# Patient Record
Sex: Female | Born: 1935 | Race: White | Hispanic: No | Smoking: Never smoker
Health system: Southern US, Community
[De-identification: ages and names within clinical notes are randomized; demographics above are authoritative.]

## PROBLEM LIST (undated history)

## (undated) DIAGNOSIS — H9190 Unspecified hearing loss, unspecified ear: Secondary | ICD-10-CM

## (undated) DIAGNOSIS — I10 Essential (primary) hypertension: Secondary | ICD-10-CM

## (undated) DIAGNOSIS — R600 Localized edema: Secondary | ICD-10-CM

## (undated) DIAGNOSIS — E039 Hypothyroidism, unspecified: Secondary | ICD-10-CM

## (undated) DIAGNOSIS — M1990 Unspecified osteoarthritis, unspecified site: Secondary | ICD-10-CM

## (undated) DIAGNOSIS — E119 Type 2 diabetes mellitus without complications: Secondary | ICD-10-CM

## (undated) DIAGNOSIS — I499 Cardiac arrhythmia, unspecified: Secondary | ICD-10-CM

## (undated) DIAGNOSIS — G629 Polyneuropathy, unspecified: Secondary | ICD-10-CM

## (undated) DIAGNOSIS — E079 Disorder of thyroid, unspecified: Secondary | ICD-10-CM

## (undated) DIAGNOSIS — K5790 Diverticulosis of intestine, part unspecified, without perforation or abscess without bleeding: Secondary | ICD-10-CM

## (undated) DIAGNOSIS — M810 Age-related osteoporosis without current pathological fracture: Secondary | ICD-10-CM

## (undated) DIAGNOSIS — G4730 Sleep apnea, unspecified: Secondary | ICD-10-CM

## (undated) HISTORY — PX: GALLBLADDER SURGERY: SHX652

## (undated) HISTORY — DX: Essential (primary) hypertension: I10

## (undated) HISTORY — DX: Type 2 diabetes mellitus without complications: E11.9

## (undated) HISTORY — DX: Age-related osteoporosis without current pathological fracture: M81.0

## (undated) HISTORY — PX: KNEE SURGERY: SHX244

## (undated) HISTORY — PX: ABDOMINAL HYSTERECTOMY: SHX81

## (undated) HISTORY — PX: JOINT REPLACEMENT: SHX530

## (undated) HISTORY — PX: CHOLECYSTECTOMY: SHX55

## (undated) HISTORY — PX: HEMORRHOID SURGERY: SHX153

---

## 2004-08-11 ENCOUNTER — Ambulatory Visit: Payer: Self-pay | Admitting: Family Medicine

## 2004-08-21 ENCOUNTER — Ambulatory Visit: Payer: Self-pay | Admitting: Family Medicine

## 2004-09-20 ENCOUNTER — Ambulatory Visit: Payer: Self-pay | Admitting: Family Medicine

## 2004-10-21 ENCOUNTER — Ambulatory Visit: Payer: Self-pay | Admitting: Family Medicine

## 2006-05-25 ENCOUNTER — Ambulatory Visit: Payer: Self-pay | Admitting: Family Medicine

## 2006-07-11 ENCOUNTER — Ambulatory Visit: Payer: Self-pay | Admitting: Gastroenterology

## 2006-08-07 ENCOUNTER — Ambulatory Visit: Payer: Self-pay | Admitting: Gastroenterology

## 2006-08-22 ENCOUNTER — Ambulatory Visit: Payer: Self-pay | Admitting: Gastroenterology

## 2006-11-06 ENCOUNTER — Ambulatory Visit: Payer: Self-pay | Admitting: Gastroenterology

## 2007-07-03 ENCOUNTER — Ambulatory Visit: Payer: Self-pay | Admitting: Family Medicine

## 2008-07-15 ENCOUNTER — Ambulatory Visit: Payer: Self-pay | Admitting: Family Medicine

## 2009-06-24 ENCOUNTER — Encounter: Payer: Self-pay | Admitting: Rheumatology

## 2009-07-21 ENCOUNTER — Ambulatory Visit: Payer: Self-pay | Admitting: Family Medicine

## 2010-08-30 ENCOUNTER — Ambulatory Visit: Payer: Self-pay | Admitting: Family Medicine

## 2011-01-23 DIAGNOSIS — R7989 Other specified abnormal findings of blood chemistry: Secondary | ICD-10-CM | POA: Insufficient documentation

## 2011-02-10 ENCOUNTER — Inpatient Hospital Stay: Payer: Self-pay | Admitting: Internal Medicine

## 2011-03-30 ENCOUNTER — Ambulatory Visit: Payer: Self-pay | Admitting: Gastroenterology

## 2011-04-07 ENCOUNTER — Ambulatory Visit: Payer: Self-pay | Admitting: Gastroenterology

## 2011-09-13 ENCOUNTER — Ambulatory Visit: Payer: Self-pay | Admitting: Family Medicine

## 2012-06-18 ENCOUNTER — Ambulatory Visit: Payer: Self-pay | Admitting: General Practice

## 2012-06-18 LAB — URINALYSIS, COMPLETE
Bilirubin,UR: NEGATIVE
Blood: NEGATIVE
Ketone: NEGATIVE
Ph: 5 (ref 4.5–8.0)
RBC,UR: <1 /HPF (ref 0–5)
Squamous Epithelial: 3
WBC UR: 1 /HPF (ref 0–5)

## 2012-06-18 LAB — BASIC METABOLIC PANEL
BUN: 29 mg/dL — ABNORMAL HIGH (ref 7–18)
Calcium, Total: 9.3 mg/dL (ref 8.5–10.1)
Chloride: 106 mmol/L (ref 98–107)
Co2: 26 mmol/L (ref 21–32)
Creatinine: 0.85 mg/dL (ref 0.60–1.30)
EGFR (African American): >60
EGFR (Non-African Amer.): >60
Glucose: 121 mg/dL — ABNORMAL HIGH (ref 65–99)
Sodium: 140 mmol/L (ref 136–145)

## 2012-06-18 LAB — CBC
HCT: 42.2 % (ref 35.0–47.0)
MCH: 30.7 pg (ref 26.0–34.0)
MCV: 89 fL (ref 80–100)
Platelet: 208 10*3/uL (ref 150–440)
RDW: 14.1 % (ref 11.5–14.5)
WBC: 9.5 10*3/uL (ref 3.6–11.0)

## 2012-06-18 LAB — PROTIME-INR: Prothrombin Time: 12.8 secs (ref 11.5–14.7)

## 2012-06-18 LAB — SEDIMENTATION RATE: Erythrocyte Sed Rate: 6 mm/hr (ref 0–30)

## 2012-06-19 LAB — URINE CULTURE

## 2012-07-04 ENCOUNTER — Inpatient Hospital Stay: Payer: Self-pay | Admitting: General Practice

## 2012-07-05 LAB — BASIC METABOLIC PANEL
Anion Gap: 6 — ABNORMAL LOW (ref 7–16)
Creatinine: 0.91 mg/dL (ref 0.60–1.30)
Glucose: 135 mg/dL — ABNORMAL HIGH (ref 65–99)
Osmolality: 278 (ref 275–301)

## 2012-07-05 LAB — HEMOGLOBIN: HGB: 12.2 g/dL (ref 12.0–16.0)

## 2012-07-05 LAB — PLATELET COUNT: Platelet: 170 10*3/uL (ref 150–440)

## 2012-07-06 LAB — BASIC METABOLIC PANEL
Anion Gap: 4 — ABNORMAL LOW (ref 7–16)
BUN: 17 mg/dL (ref 7–18)
Calcium, Total: 8.7 mg/dL (ref 8.5–10.1)
Chloride: 111 mmol/L — ABNORMAL HIGH (ref 98–107)
Creatinine: 0.9 mg/dL (ref 0.60–1.30)
EGFR (African American): >60
Glucose: 102 mg/dL — ABNORMAL HIGH (ref 65–99)
Osmolality: 285 (ref 275–301)
Potassium: 4.5 mmol/L (ref 3.5–5.1)
Sodium: 142 mmol/L (ref 136–145)

## 2012-07-06 LAB — PLATELET COUNT: Platelet: 150 10*3/uL (ref 150–440)

## 2012-07-06 LAB — HEMOGLOBIN: HGB: 12.5 g/dL (ref 12.0–16.0)

## 2013-01-08 ENCOUNTER — Ambulatory Visit: Payer: Self-pay | Admitting: Family Medicine

## 2013-03-25 ENCOUNTER — Ambulatory Visit (INDEPENDENT_AMBULATORY_CARE_PROVIDER_SITE_OTHER): Payer: MEDICARE | Admitting: Podiatry

## 2013-03-25 ENCOUNTER — Encounter: Payer: Self-pay | Admitting: Podiatry

## 2013-03-25 VITALS — BP 126/57 | HR 60 | Resp 18 | Ht 62.0 in | Wt 182.0 lb

## 2013-03-25 DIAGNOSIS — M79609 Pain in unspecified limb: Secondary | ICD-10-CM

## 2013-03-25 DIAGNOSIS — B351 Tinea unguium: Secondary | ICD-10-CM

## 2013-03-25 MED ORDER — GABAPENTIN 300 MG PO CAPS: ORAL_CAPSULE | ORAL | Status: DC

## 2013-03-25 NOTE — Progress Notes (Signed)
Cheryl Rios presents today for routine debridement of her toenails she is also complaining of increased burning to her feet bilaterally she states that the feet burn more at nighttime than they do during the day. She continues to take her Neurontin 300 mg 1 twice a day.  Objective: Vital signs are stable she is alert and oriented x3. Pulses remain palpable bilateral. Nails are thick yellow dystrophic clinically mycotic.  Assessment: Peripheral neuropathy with pain in limb secondary to onychomycosis.  Plan: Debridement of nails in thickness and length as a covered service today I also evaluated her concentration of gabapentin. At this point we are going increase 300 mg at nighttime. So from here on out she will be taking 300 mg in the morning and 600 mg at dinner time. All with her in 3 months.

## 2013-06-24 ENCOUNTER — Ambulatory Visit (INDEPENDENT_AMBULATORY_CARE_PROVIDER_SITE_OTHER): Payer: MEDICARE | Admitting: Podiatry

## 2013-06-24 ENCOUNTER — Encounter: Payer: Self-pay | Admitting: Podiatry

## 2013-06-24 VITALS — BP 131/63 | HR 87 | Resp 16 | Ht 62.0 in | Wt 190.0 lb

## 2013-06-24 DIAGNOSIS — E1149 Type 2 diabetes mellitus with other diabetic neurological complication: Secondary | ICD-10-CM

## 2013-06-24 DIAGNOSIS — Q828 Other specified congenital malformations of skin: Secondary | ICD-10-CM

## 2013-06-24 DIAGNOSIS — M79609 Pain in unspecified limb: Secondary | ICD-10-CM

## 2013-06-24 DIAGNOSIS — B351 Tinea unguium: Secondary | ICD-10-CM

## 2013-06-24 MED ORDER — GABAPENTIN 300 MG PO CAPS: ORAL_CAPSULE | ORAL | Status: DC

## 2013-06-24 NOTE — Progress Notes (Signed)
She presents today with a chief complaint of painful toenails one through 5 bilateral.  Objective: Vital signs are stable she is alert and oriented x3. Pulses are palpable bilateral. Nails are thick yellow dystrophic onychomycotic and painful palpation.  Assessment: Pain in limb secondary to onychomycosis 1 through 5 bilateral.  Plan: Debridement nails 1 through 5 bilateral is cover service secondary to pain. Followup with her in 3 months

## 2013-09-23 ENCOUNTER — Ambulatory Visit (INDEPENDENT_AMBULATORY_CARE_PROVIDER_SITE_OTHER): Payer: MEDICARE | Admitting: Podiatry

## 2013-09-23 ENCOUNTER — Encounter: Payer: Self-pay | Admitting: Podiatry

## 2013-09-23 VITALS — BP 146/82 | HR 68 | Resp 18

## 2013-09-23 DIAGNOSIS — E1149 Type 2 diabetes mellitus with other diabetic neurological complication: Secondary | ICD-10-CM

## 2013-09-23 MED ORDER — GABAPENTIN 300 MG PO CAPS: ORAL_CAPSULE | ORAL | Status: DC

## 2013-09-23 NOTE — Progress Notes (Signed)
She presents today for followup of her neuropathy and her gabapentin.  Objective: She still has some breakthrough pain bilateral.  Assessment: Diabetic peripheral neuropathy.  Plan: Increase her Neurontin 600 mg in the morning 300 mg at lunch and 600 mg at bedtime.

## 2013-10-04 DIAGNOSIS — M810 Age-related osteoporosis without current pathological fracture: Secondary | ICD-10-CM | POA: Insufficient documentation

## 2013-10-16 ENCOUNTER — Encounter: Payer: Self-pay | Admitting: Rheumatology

## 2013-10-21 ENCOUNTER — Encounter: Payer: Self-pay | Admitting: Rheumatology

## 2013-10-28 ENCOUNTER — Ambulatory Visit (INDEPENDENT_AMBULATORY_CARE_PROVIDER_SITE_OTHER): Payer: MEDICARE | Admitting: Podiatry

## 2013-10-28 VITALS — BP 125/60 | HR 82 | Resp 16

## 2013-10-28 DIAGNOSIS — M79609 Pain in unspecified limb: Secondary | ICD-10-CM

## 2013-10-28 DIAGNOSIS — E1149 Type 2 diabetes mellitus with other diabetic neurological complication: Secondary | ICD-10-CM

## 2013-10-28 NOTE — Progress Notes (Signed)
She presents today for followup of the pain in her feet she states that 600 mg of Neurontin in the morning, 300 at lunch in 600 and bedtime seems to work very well. She states that she has very little pain in her feet at this point.  Objective: Vital signs are stable she is alert oriented x3. Pulses are strongly palpable bilateral. No reproducible pain on palpation bilateral.  Assessment: Neuropathy bilateral foot.  Plan: Continue Neurontin 600 mg a.m., 300 mg at lunch and 600 mg before bed. Followup with her as needed.

## 2014-02-04 ENCOUNTER — Ambulatory Visit: Payer: Self-pay | Admitting: Family Medicine

## 2014-07-17 ENCOUNTER — Ambulatory Visit (INDEPENDENT_AMBULATORY_CARE_PROVIDER_SITE_OTHER): Payer: MEDICARE | Admitting: Podiatry

## 2014-07-17 VITALS — BP 115/60 | HR 88 | Resp 20

## 2014-07-17 DIAGNOSIS — E1149 Type 2 diabetes mellitus with other diabetic neurological complication: Secondary | ICD-10-CM

## 2014-07-17 DIAGNOSIS — B353 Tinea pedis: Secondary | ICD-10-CM

## 2014-07-17 DIAGNOSIS — L84 Corns and callosities: Secondary | ICD-10-CM

## 2014-07-17 DIAGNOSIS — E1140 Type 2 diabetes mellitus with diabetic neuropathy, unspecified: Secondary | ICD-10-CM

## 2014-07-17 MED ORDER — KETOCONAZOLE 2 % EX CREA: 1.0000 "application " | TOPICAL_CREAM | Freq: Every day | CUTANEOUS | Status: DC

## 2014-07-17 NOTE — Progress Notes (Signed)
Patient ID: Cheryl Rios, female   DOB: 1925-11-28, 81 y.o.   MRN: [id number]  Subjective: 81 year old female presents the office today with complaints of a rash on both of her feet. She states that the rash periodically will burn. This has been ongoing for several weeks. She has been applying antibiotic ointment overlying the area any resolution. She also states that she has calluses on both of her feet which are painful. She denies any redness or drainage around the callus sites. She denies any systemic complaints as fevers, chills, nausea, vomiting. No other complaints at this time.  She will be undergoing a knee replacement in early March.   Objective: AAO 3, NAD DP/PT pulses palpable, CRT less than 3 seconds Decrease in protective sensation with Simms Weinstein monofilament, decreased vibratory sensation, Achilles tendon reflex intact. Hyperkeratotic lesions are present to bilateral heels and bilateral submetatarsal 5. Upon debridement lesions there is no underlying ulceration, drainage, clinical signs of infection. There is dry, peeling, scaly skin bilateral feet with overlying erythematous skin changes consistent with tinea pedis. There is also mild area interdigitally.there is no drainage associated with lesions and there is no clinical signs of infection. No other areas of tenderness to bilateral lower extremities. No open lesions or other pre-ulcer lesions identified. No calf Compression, swelling, warmth, erythema.  Assessment: 81 year old female with likely tinea pedis bilaterally, hyperkeratotic lesions 4.  Plan: -Treatment options were discussed with the patient including alternatives, risks, complications. -Lesion sharply debrided 4 without complication/bleeding. -For likely tinea pedis prescribed ketoconazole cream. Discussed how to apply the medication with the patient. -Follow-up in 3 months or sooner if any problems are to arise. In the meantime, encouraged to call the  office with any questions, concerns, change in symptoms.

## 2014-07-17 NOTE — Patient Instructions (Signed)
Diabetes and Foot Care Diabetes may cause you to have problems because of poor blood supply (circulation) to your feet and legs. This may cause the skin on your feet to become thinner, break easier, and heal more slowly. Your skin may become dry, and the skin may peel and crack. You may also have nerve damage in your legs and feet causing decreased feeling in them. You may not notice minor injuries to your feet that could lead to infections or more serious problems. Taking care of your feet is one of the most important things you can do for yourself.  HOME CARE INSTRUCTIONS  Wear shoes at all times, even in the house. Do not go barefoot. Bare feet are easily injured.  Check your feet daily for blisters, cuts, and redness. If you cannot see the bottom of your feet, use a mirror or ask someone for help.  Wash your feet with warm water (do not use hot water) and mild soap. Then pat your feet and the areas between your toes until they are completely dry. Do not soak your feet as this can dry your skin.  Apply a moisturizing lotion or petroleum jelly (that does not contain alcohol and is unscented) to the skin on your feet and to dry, brittle toenails. Do not apply lotion between your toes.  Trim your toenails straight across. Do not dig under them or around the cuticle. File the edges of your nails with an emery board or nail file.  Do not cut corns or calluses or try to remove them with medicine.  Wear clean socks or stockings every day. Make sure they are not too tight. Do not wear knee-high stockings since they may decrease blood flow to your legs.  Wear shoes that fit properly and have enough cushioning. To break in new shoes, wear them for just a few hours a day. This prevents you from injuring your feet. Always look in your shoes before you put them on to be sure there are no objects inside.  Do not cross your legs. This may decrease the blood flow to your feet.  If you find a minor scrape,  cut, or break in the skin on your feet, keep it and the skin around it clean and dry. These areas may be cleansed with mild soap and water. Do not cleanse the area with peroxide, alcohol, or iodine.  When you remove an adhesive bandage, be sure not to damage the skin around it.  If you have a wound, look at it several times a day to make sure it is healing.  Do not use heating pads or hot water bottles. They may burn your skin. If you have lost feeling in your feet or legs, you may not know it is happening until it is too late.  Make sure your health care provider performs a complete foot exam at least annually or more often if you have foot problems. Report any cuts, sores, or bruises to your health care provider immediately. SEEK MEDICAL CARE IF:   You have an injury that is not healing.  You have cuts or breaks in the skin.  You have an ingrown nail.  You notice redness on your legs or feet.  You feel burning or tingling in your legs or feet.  You have pain or cramps in your legs and feet.  Your legs or feet are numb.  Your feet always feel cold. SEEK IMMEDIATE MEDICAL CARE IF:   There is increasing redness,   swelling, or pain in or around a wound.  There is a red line that goes up your leg.  Pus is coming from a wound.  You develop a fever or as directed by your health care provider.  You notice a bad smell coming from an ulcer or wound. Document Released: 05/06/2000 Document Revised: 01/09/2013 Document Reviewed: 10/16/2012 Exit[employer] Patient Information 2015 [employer]. This information is not intended to replace advice given to you by your health care provider. Make sure you discuss any questions you have with your health care provider.  

## 2014-07-22 ENCOUNTER — Ambulatory Visit: Payer: Self-pay | Admitting: General Practice

## 2014-07-23 ENCOUNTER — Ambulatory Visit: Payer: MEDICARE | Admitting: Podiatry

## 2014-08-06 ENCOUNTER — Inpatient Hospital Stay: Payer: Self-pay | Admitting: General Practice

## 2014-08-09 DIAGNOSIS — E119 Type 2 diabetes mellitus without complications: Secondary | ICD-10-CM | POA: Insufficient documentation

## 2014-08-09 DIAGNOSIS — E785 Hyperlipidemia, unspecified: Secondary | ICD-10-CM | POA: Insufficient documentation

## 2014-09-12 NOTE — Op Note (Signed)
PATIENT NAME:  Cheryl Rios, Cheryl Rios MR#:  [id number] DATE OF BIRTH:  08/23/1925  DATE OF PROCEDURE:  07/04/2012  PREOPERATIVE DIAGNOSIS:  Degenerative arthrosis of the right knee.   POSTOPERATIVE DIAGNOSIS:  Degenerative arthrosis of the right knee.   PROCEDURE PERFORMED:  Right total knee arthroplasty using computer-assisted navigation.   SURGEON: Illene LabradorJames P. Hooten, MD   ASSISTANT:  Van ClinesJon Wolfe, PA.  ANESTHESIA:  Femoral nerve block and spinal.   ESTIMATED BLOOD LOSS:  100 mL.   FLUIDS REPLACED:  1200 mL of crystalloid.   TOURNIQUET TIME:  74 minutes.   DRAINS:  Two medium drains to reinfusion system.   SOFT TISSUE RELEASES:  Anterior cruciate ligament, posterior cruciate ligament, deep medial collateral ligament and patellofemoral ligament.   IMPLANTS UTILIZED:  DePuy PFC Sigma size 3 posterior stabilized femoral component (cemented), size 3 MBT tibial component (cemented), 35 mm 3-peg oval dome patella (cemented) and a 10 mm stabilized rotating platform polyethylene insert.   INDICATIONS FOR SURGERY:  The patient is an 81 year old female who has been seen for complaints of progressive right knee pain. X-rays demonstrated severe degenerative changes in a tricompartmental fashion with relative varus deformity. The patient denies any significant improvement despite conservative nonsurgical intervention. After discussion of the risks and benefits of surgical intervention, the patient expressed understanding of the risks and benefits and agreed with plans for surgical intervention.   PROCEDURE IN DETAIL:  The patient was brought into the operating room and after adequate femoral nerve block and spinal anesthesia was achieved, a tourniquet was placed on the patient's upper right thigh. The patient's right knee and leg were cleaned and prepped with alcohol and DuraPrep and draped in the usual sterile fashion. A "timeout" was performed as per usual protocol. The right lower extremity was exsanguinated  using an Esmarch, and the tourniquet was inflated to 300 mmHg. An anterior longitudinal incision was made followed by a standard mid vastus approach. A moderate effusion was evacuated. The deep fibers of the medial collateral ligament were elevated in a subperiosteal fashion off the medial flare of the tibia so as to maintain a continuous soft tissue sleeve. The patella was subluxed laterally and the patellofemoral ligament was incised. Inspection of the knee demonstrated severe degenerative changes in a tricompartmental fashion with prominent osteophytes noted and full thickness loss of articular cartilage. Osteophytes were debrided using a rongeur. Anterior and posterior cruciate ligaments were excised. Two 4.0 mm Schanz pins were inserted into the femur and into the tibia for attachment of the array of trackers used for computer-assisted navigation. Hip center was identified using circumduction technique. Distal landmarks were mapped using the computer. The distal femur and proximal tibia were mapped using the computer. Distal femoral cutting guide was positioned using computer-assisted navigation so as to achieve a 5 degree distal valgus cut. Cut was performed and verified using the computer. The distal femur was sized and it was felt that a size 3 femoral component was appropriate. The size 3 cutting guide was positioned and anterior cut was performed and verified using the computer. This was followed by completion of the posterior and chamfer cuts. A femoral cutting guide for the central box was then positioned and the central box cut was performed.   Attention was then directed to the proximal tibia. Medial and lateral menisci were excised. The extramedullary tibial cutting guide was positioned using computer-assisted navigation so as to achieve 0 degree varus-valgus alignment and 0 degree posterior slope. Cut was performed and verified using the computer.  The proximal tibia was sized and it was felt that a  size 3 tibial tray was appropriate. Tibial and femoral trials were inserted followed by insertion of a 10 mm polyethylene insert. Excellent mediolateral soft tissue balancing was appreciated both in extension and in flexion. Finally, the patella was cut and prepared so as to accommodate a 35 mm 3-peg oval dome patella. Patellar trial was placed and the knee was placed through a range of motion with excellent patellar tracking appreciated.   Femoral trial was removed after debridement of posterior osteophytes. Central post hole for the tibial component was reamed followed by insertion of a keel punch. Tibial trials were then removed. The cut surfaces of bone were irrigated with copious amounts of normal saline with antibiotic solution using pulsatile lavage and suctioned dry. Polymethyl methacrylate cement with gentamicin was prepared in the usual fashion with a vacuum mixer. Cement was applied to the cut surface of the proximal tibia as well as along the undersurface of a size 3 MBT tibial component. The tibial component was positioned and impacted into place. Excess cement was removed using Personal assistant. Cement was then applied to the cut surface of the femur as well as along the posterior flanges of a size 3 posterior stabilized femoral component. The femoral component was positioned and impacted into place. Excess cement was removed using Personal assistant. A 10 mm polyethylene trial was inserted and the knee was brought in full extension with steady axial compression applied. Finally, cement was applied to the backside of a 35 mm 3-peg oval dome patella and the patellar component was positioned and patellar clamp applied. Excess cement was removed using Personal assistant.   After adequate curing of cement, the tourniquet was deflated after a total tourniquet time of 74 minutes. Hemostasis was achieved using electrocautery. The knee was irrigated with copious amounts of normal saline with antibiotic solution  using pulsatile lavage and then suctioned dry. The knee was inspected for any residual cement debris. Then, 30 mL of 0.25% Marcaine with epinephrine was injected along the posterior capsule. A 10 mm stabilized rotating platform polyethylene insert was inserted and the knee was placed through range of motion with excellent patellar tracking appreciated. Two medium drains were placed in the wound bed and brought out through a separate stab incision to be attached to a reinfusion system.   The medial parapatellar portion of the incision was reapproximated using interrupted sutures of #1 Vicryl. The subcutaneous tissue was approximated in layers using first #0 Vicryl followed by 2-0 Vicryl. The skin was closed with skin staples. A sterile dressing was applied.   The patient tolerated the procedure well. She was transported to the recovery room in stable condition.    ____________________________ Illene Labrador. Angie Fava., MD [email or web address] D: 07/04/2012 22:38:10 ET T: 07/05/2012 00:09:49 ET JOB#: [id number]  cc: Illene Labrador. Angie Fava., MD, <Dictator> Illene Labrador Angie Fava MD ELECTRONICALLY SIGNED 07/06/2012 9:26

## 2014-09-12 NOTE — Discharge Summary (Signed)
PATIENT NAME:  Cheryl Rios, Cheryl Rios MR#:  [id number] DATE OF BIRTH:  30-Jan-1926  DATE OF ADMISSION:  07/04/2012 DATE OF DISCHARGE:  07/07/2012  ADMITTING DIAGNOSIS: Degenerative arthrosis of the right knee.   DISCHARGE DIAGNOSIS: Degenerative arthrosis of the right knee.   OPERATION: On 07/04/2012, the patient had a right total knee arthroplasty using computer-aided navigation.   SURGEON: Francesco SorJames Hooten, M.D.   ASSISTANT: Van ClinesJon Wolfe, PA.   ANESTHESIA: Femoral nerve block and spinal.   ESTIMATED BLOOD LOSS: 100 mL.   FLUIDS REPLACED: 1200 mL of crystalloid.   TOURNIQUET TIME: 74 minutes.   DRAINS: Two medium drains to reinfusion system.   IMPLANTS UTILIZED: DePuy PFC Sigma size 3 posterior stabilized femoral component that was cemented, a size 3 MBT tibial component that was cemented, a 35-mm, 3-peg oval dome patella that was cemented, and a 10-mm stabilized rotating platform polyethylene insert. No complications. The patient brought to the recovery room, and then brought to the Orthopaedic floor for pain control and physical therapy.   HISTORY OF PRESENT ILLNESS: The patient is 81 year old female who presented for an upcoming total knee replacement on the right. The patient has had Synvisc injections and cortisone injections with no relief. The patient has continued to have significant pain and difficulty with activities of daily living.   PHYSICAL EXAMINATION:  GENERAL: A well-developed, well-nourished female with an antalgic gait and a varus thrust to the right knee.  CARDIOVASCULAR: Normal rate and rhythm.  LUNGS: Clear to auscultation.  MUSCULOSKELETAL: In regard to the right lower extremity, the patient has medial joint line tenderness with pseudolaxity medially. The patient has full extension to 110 degrees flexion with pain. The patient has no specific atrophy or quadriceps weakness.   HOSPITAL COURSE: After initial admission on 07/04/2012, the patient was brought to the Orthopaedic  floor. On postoperative day 1, the patient had a hemoglobin of 12.2, which did not seem to drop, it was down to 12.5 on the 14th after receiving transfused blood from her [employer] system. The patient did not receive any specific blood. The patient was tolerating physical therapy well and was ambulating around the nurse's station including did stairs the day before discharge and was having only mild to moderate pain. The patient was ready to go home with home health physical therapy on 07/07/2012 and had no other specific complications.   DISCHARGE INSTRUCTIONS:  1. [facility] 07/19/2012 at 9:45 with Van ClinesJon Wolfe. The patient will do weight bear as tolerated on her right leg. The patient will raise her leg with 1 to 2 pillows. She will use thigh-high TED hose on both legs and removed at bedtime. The patient will elevate her heels off the bed.  2. The patient will do a regular diet.  3. The patient will use her Polar Care System to decrease swelling and control pain.  4. The patient will call the clinic if there is any bright red bleeding or calf pain, or any fever greater than 101.5 and any bowel or bladder difficulty.   DISCHARGE MEDICATIONS: Resume home medications and the to add oxycodone 5 mg 1 tablet q.4 to 6h. p.r.n. for severe pain, Celebrex 2 mg p.o. b.i.d. for one month.   ____________________________ Shela CommonsJ. Dedra Skeensodd , [place]PA [email or web address]:jm D: 07/07/2012 06:59:24 ET T: 07/07/2012 11:03:00 ET JOB#: [id number]  cc: J. Dedra Skeensodd , [place], <Dictator> J  [facility] CenterMUNDY PA ELECTRONICALLY SIGNED 07/11/2012 8:08

## 2014-09-21 NOTE — Op Note (Signed)
PATIENT NAME:  Idalia NeedleDUNDALOW, Cheryl MR#:  [id number] DATE OF BIRTH:  [birth year]  DATE OF PROCEDURE:  08/06/2014  PREOPERATIVE DIAGNOSIS: Degenerative arthrosis of the left knee (primary).   POSTOPERATIVE DIAGNOSIS: Degenerative arthrosis of the left knee (primary).  PROCEDURE PERFORMED: Left total knee arthroplasty using computer-assisted navigation.   SURGEON: Dr. Francesco SorJames Hooten.  ASSISTANT: Cranston Neighborhris Gaines, PA-C (required to maintain retraction throughout the procedure).   ANESTHESIA: Spinal.   ESTIMATED BLOOD LOSS: 100 mL.   FLUIDS REPLACED: 1700 mL of crystalloid.   TOURNIQUET TIME: 95 minutes.   DRAINS: Two medium drains to reinfusion system.   SOFT TISSUE RELEASES: Anterior cruciate ligament, posterior cruciate ligament, deep medial collateral ligament, and patellofemoral ligament.   IMPLANTS UTILIZED: [employer] Sigma size 3 posterior stabilized femoral component (cemented), size 3 MBT tibial component (cemented), 35 mm, 3 peg oval dome patella (cemented), and a 10 mm stabilized rotating platform polyethylene insert. Gentamicin bone cement was utilized due to the patient's history of diabetes.  INDICATIONS FOR SURGERY: The patient is an 81 year old female who has been seen for complaints of progressive left knee pain. X-rays demonstrated severe degenerative changes in tricompartmental fashion with relative varus deformity. After discussion of the risks and benefits of surgical intervention, the patient expressed understanding of the risks, and benefits, and agreed with plans for surgical intervention.   PROCEDURE IN DETAIL: The patient was brought in to the operating room and, after adequate spinal anesthesia was achieved, a tourniquet was placed on the patient's upper left thigh. The patient's left knee and leg were cleaned and prepped with alcohol and DuraPrep, and draped in the usual sterile fashion. A "timeout" was performed as per usual protocol. The left lower extremity was  exsanguinated using an Esmarch, tourniquet was inflated to 300 mmHg.  An anterior longitudinal incision was made followed by a standard mid vastus approach. A moderate effusion was evacuated. The deep fibers of the medial collateral ligament were elevated in a subperiosteal fashion off the medial flare of the tibia so as to maintain a continuous soft tissue sleeve. The patella was subluxed laterally and the patellofemoral ligament was incised. Inspection of the knee demonstrated severe degenerative changes with full-thickness loss of articular cartilage to the medial compartment. Prominent osteophytes were debrided using a rongeur. Anterior and posterior cruciate ligaments were excised. Two 4.0 mm Schanz pins were inserted into the femur and into the tibia for attachment of the ray of trackers used for computer-assisted navigation. Hip center was identified using circumduction technique. Distal landmarks were mapped using the computer. The distal femur and proximal tibia were mapped using the computer. A distal femoral cutting guide was positioned using computer-assisted navigation so as to achieve 5 degrees distal valgus cut. Cut was performed and verified using the computer. Distal femur was sized and it was felt that a size 3 femoral component was appropriate. A size 3 cutting guide was positioned, and anterior cut was performed, and verified using the computer. This was followed by completion of the posterior and chamfer cuts. Femoral cutting guide for a central box was then positioned, and the central box cut was performed. Attention was then directed to the proximal tibia. Medial and lateral menisci were excised. The extramedullary tibial cutting guide was positioned using computer-assisted navigation so as to achieve a 0 degree varus valgus alignment and 0 degree posterior slope. Cut was performed and verified using the computer. The proximal tibia was sized and it was felt that a size 3 tibial tray was  appropriate. Tibial  and femoral trials were inserted followed by insertion of a 10 mm polyethylene trial. Excellent mediolateral soft tissue balancing was appreciated both in full extension and in flexion. Finally, the patella was cut and repaired so as to accommodate a 35 mm, 3 peg oval dome patella. Patellar trial was placed.  The knee was placed through a range of motion with excellent patellar tracking appreciated. The femoral trial was removed. Central post over the tibial component was reamed followed by insertion of a keel punch. Tibial trial was then removed. The cut surfaces of bone were irrigated with copious amounts of normal saline with antibiotic solution using pulsatile lavage and then suctioned dry. Polymethyl methacrylate cement with gentamicin was prepared in the usual fashion using a vacuum mixer. Cement was applied to the cut surface of the proximal tibia as well as along the undersurface of a size 3 MBT tibial component. The tibial component was positioned and impacted into place. Excess cement was removed using Personal assistant. Cement was then applied to the cut surface of the femur as well as on the posterior flanges of a size 3 posterior stabilized femoral component. Femoral component was positioned and impacted into place. Excess cement was removed using Personal assistant. A 10 mm polyethylene trial was inserted and the knee was brought in full extension with steady axial compression applied. Finally, cement was applied to the backside of a 35 mm. 3 peg oval dome patella, and the patella component was positioned, and patellar clamp applied. Excess cement was removed using Personal assistant.   After adequate curing of cement, the tourniquet was deflated after a total tourniquet time of 95 minutes. Hemostasis was achieved using electrocautery.  The knee was irrigated with copious amounts of normal saline with antibiotic solution using pulsatile lavage and then suctioned dry. The knee was  inspected for any residual cement debris. Twenty mL of 1.3% Exparel and 40 mL of normal saline was injected along the posterior capsule, medial and lateral gutters, and along the arthrotomy site. A 10 mm stabilized rotating platform polyethylene insert was inserted and the knee was placed through range of motion with excellent patellar tracking appreciated and excellent medial and lateral soft tissue balancing noted. Two medium drains were placed in the wound bed and brought out through a separate stab incision to be attached to a reinfusion system. The medial parapatellar portion of the incision was reapproximated using interrupted sutures of #1 Vicryl. The subcutaneous tissue was approximated in layers using first #0 Vicryl followed by #2-0 Vicryl. The subcutaneous tissue had been injected with a total of 30 mL of 0.25% Marcaine with epinephrine. Skin was approximated with skin staples. A sterile dressing was applied.   The patient tolerated the procedure well. She was transported to the recovery room in stable condition.   ____________________________ Illene Labrador. Angie Fava., MD jph:sp D: 08/07/2014 06:09:04 ET T: 08/07/2014 11:18:03 ET JOB#: [id number]  cc: Illene Labrador. Angie Fava., MD, <Dictator> Illene Labrador Angie Fava MD ELECTRONICALLY SIGNED 08/09/2014 16:49

## 2014-09-21 NOTE — Discharge Summary (Signed)
PATIENT NAME:  Cheryl Rios, Cheryl Rios MR#:  [id number] DATE OF BIRTH:  [birth year]  DATE OF ADMISSION:  08/06/2014 DATE OF DISCHARGE:  08/09/2014  ADMITTING DIAGNOSIS: Degenerative arthritis of left knee.   DISCHARGE DIAGNOSIS: Degenerative arthritis of left knee.   PROCEDURE PERFORMED: Left total knee arthroplasty using computer-assisted navigation.   SURGEON: DrFayrene Fearing.. James Hooten  ASSISTANT: Cranston Neighborhris , PA-C   ANESTHESIA: Spinal.   ESTIMATED BLOOD LOSS: 100 mL.   FLUIDS REPLACED: 1700 mL of crystalloid.   TOURNIQUET TIME: 95 minutes.   DRAINS: Two medium drains to reinfusion system.   SOFT TISSUE RELEASES: Anterior cruciate ligament, posterior cruciate ligament, deep medial collateral ligament and patellofemoral ligament.   IMPLANTS UTILIZED: DePuy P[employer] Sigma size 3 posterior stabilized femoral component, size 3 MBT tibial component, 35 mm 3 peg oval dome patella, and a 10 mm stabilized rotating platform polyethylene insert. Gentamicin bone cement was utilized due to the patient's history of diabetes.   INDICATIONS FOR SURGERY: The patient is an 81 year old female who has been seen for complaints of progressive left knee pain. X-rays demonstrated severe degenerative changes in the tricompartmental fashion with relative varus deformity. After discussion of the risks and benefits of surgical intervention, the patient expressed understanding of the risks and benefits and agreed with plans for surgical intervention.   PHYSICAL EXAMINATION: GENERAL: Well developed, well-nourished female, no apparent distress,  normal affect. Antalgic component  left lower extremity.  LEFT KNEE:  Examination  shows the patient has mild swelling with no warmth, erythema or effusion. She is tender along the medial joint line, nontender along the lateral joint line. She has no significant laxity with valgus and varus stress testing. She is neurovascularly intact in the left lower extremity.  HEART: Regular rate and  rhythm.  LUNGS: Clear to auscultation bilaterally. No wheezing, rales or rhonchi.   HOSPITAL COURSE: The patient was admitted to the hospital on 08/06/2014. She had surgery  that same day and was brought to the orthopedic floor from the PACU in stable condition. On postoperative day 1, the patient had acute postoperative blood loss anemia with a hemoglobin of 10.6. On postoperative day 2, this hemoglobin did trend up to 10.7. Vital signs and labs remained stable. She did have some good progress with physical therapy, but ultimately the physical therapist had recommended she return go to a skilled nursing facility. By postoperative day 3, the patient was stable and ready for discharge to a rehab facility.   CONDITION AT DISCHARGE:  Stable.   DISCHARGE INSTRUCTIONS: The patient may gradually increase weight-bearing on the affected extremity. Elevate the affected foot or leg on 1 or 2 pillows with the foot higher than the knee. Thigh-high TED hose on both legs and remove 1 hour per 8 hour shift. Elevate the heels off the bed. Use incentive spirometer every hour while awake. Encourage cough and deep breathing. The patient may resume a regular diet as tolerated. Continue using Polar Care unit maintaining a temperature between 40 and 50 degrees.  Do not get the dressing or bandage wet or dirty. [facility] if the dressing gets water under it. Leave the dressing on. [facility] if any of the following occur: Bright red bleeding from the incisional wound, fever above 101.5 degrees, redness, swelling, or drainage at the incision site. Call [facility] if you experience any increased leg pain, numbness or weakness in her legs or bowel or bladder symptoms. Physical therapy has been arranged at  rehab facility. [facility] for  any concerns.   DISCHARGE MEDICATIONS: Please see list of discharge medications on discharge instructions.    ____________________________ T. Cranston Neighbor,  PA-C tcg:tr D: 08/08/2014 12:17:52 ET T: 08/08/2014 12:48:37 ET JOB#: [id number]  cc: T. Cranston Neighbor, PA-C, <Dictator> Evon Slack [place] ELECTRONICALLY SIGNED 08/13/2014 15:18

## 2014-09-29 ENCOUNTER — Ambulatory Visit (INDEPENDENT_AMBULATORY_CARE_PROVIDER_SITE_OTHER): Payer: MEDICARE | Admitting: Podiatry

## 2014-09-29 ENCOUNTER — Encounter: Payer: Self-pay | Admitting: Podiatry

## 2014-09-29 VITALS — Ht 62.0 in | Wt 190.0 lb

## 2014-09-29 DIAGNOSIS — B351 Tinea unguium: Secondary | ICD-10-CM | POA: Diagnosis not present

## 2014-09-29 DIAGNOSIS — E1140 Type 2 diabetes mellitus with diabetic neuropathy, unspecified: Secondary | ICD-10-CM | POA: Diagnosis not present

## 2014-09-29 DIAGNOSIS — M79673 Pain in unspecified foot: Secondary | ICD-10-CM

## 2014-09-29 DIAGNOSIS — B353 Tinea pedis: Secondary | ICD-10-CM

## 2014-09-29 DIAGNOSIS — E1149 Type 2 diabetes mellitus with other diabetic neurological complication: Secondary | ICD-10-CM

## 2014-09-30 NOTE — Progress Notes (Signed)
She presents today with chief complaint of a rash and plantar aspect of the bilateral foot she states that she uses ketoconazole and the rash goes away. She states that her nails are thick yellow and painful and irritated her with shoe gear.  Objective: Vital signs are stable she is alert and oriented 3 pulses are strongly palpable bilateral. Neurologic sensorium is intact. Deep tendon reflexes are intact. Nails are thick yellow dystrophic onychomycotic and painful on palpation as well as debridement. She has patches of tinea pedis to the plantar aspect of the bilateral foot.  Assessment: Pain in limb secondary to onychomycosis 1 through 5 bilateral with tinea pedis.  Plan: Discussed etiology and pathology conservative versus surgical therapies. Debrided nails 1 through 5 bilateral. Encouraged her to continue to use the ketoconazole on a regular basis

## 2014-11-03 ENCOUNTER — Ambulatory Visit: Payer: MEDICARE

## 2014-12-18 ENCOUNTER — Other Ambulatory Visit: Payer: Self-pay | Admitting: Podiatry

## 2014-12-30 ENCOUNTER — Ambulatory Visit (INDEPENDENT_AMBULATORY_CARE_PROVIDER_SITE_OTHER): Payer: MEDICARE | Admitting: Podiatry

## 2014-12-30 DIAGNOSIS — M79673 Pain in unspecified foot: Secondary | ICD-10-CM

## 2014-12-30 DIAGNOSIS — B351 Tinea unguium: Secondary | ICD-10-CM

## 2014-12-30 DIAGNOSIS — E1149 Type 2 diabetes mellitus with other diabetic neurological complication: Secondary | ICD-10-CM

## 2014-12-30 DIAGNOSIS — E1140 Type 2 diabetes mellitus with diabetic neuropathy, unspecified: Secondary | ICD-10-CM

## 2014-12-30 NOTE — Progress Notes (Signed)
Subjective: 81 y.o. returns the office today for painful, elongated, thickened toenails which she is unable to trim herself. Denies any redness or drainage around the nails. Denies any acute changes since last appointment and no new complaints today. Denies any systemic complaints such as fevers, chills, nausea, vomiting.   Objective: AAO 3, NAD DP/PT pulses palpable, CRT less than 3 seconds Protective sensation decreased with Simms Weinstein monofilament. Nails hypertrophic, dystrophic, elongated, brittle, discolored 10. There is tenderness overlying the nails 1-5 bilaterally. There is no surrounding erythema or drainage along the nail sites. No open lesions or pre-ulcerative lesions are identified. No evidence of tinea pedis at this time.  No other areas of tenderness bilateral lower extremities. No overlying edema, erythema, increased warmth. No pain with calf compression, swelling, warmth, erythema.  Assessment: Patient presents with symptomatic onychomycosis  Plan: -Treatment options including alternatives, risks, complications were discussed -Nails sharply debrided 10 without complication/bleeding. -Discussed daily foot inspection. If there are any changes, to call the office immediately.  -Follow-up in 3 months or sooner if any problems are to arise. In the meantime, encouraged to call the office with any questions, concerns, changes symptoms.   Ovid Curd, DPM

## 2015-04-02 ENCOUNTER — Ambulatory Visit (INDEPENDENT_AMBULATORY_CARE_PROVIDER_SITE_OTHER): Payer: MEDICARE | Admitting: Podiatry

## 2015-04-02 DIAGNOSIS — M79673 Pain in unspecified foot: Secondary | ICD-10-CM

## 2015-04-02 DIAGNOSIS — B351 Tinea unguium: Secondary | ICD-10-CM

## 2015-04-02 NOTE — Progress Notes (Signed)
Subjective: 81 y.o. returns the office today for painful, elongated, thickened toenails which she is unable to trim herself. Denies any redness or drainage around the nails. She also gets a painful callus on the right ball of the foot. Denies any redness or drainage. No open sores. Denies any acute changes since last appointment and no new complaints today. Denies any systemic complaints such as fevers, chills, nausea, vomiting.   Objective: AAO 3, NAD DP/PT pulses palpable, CRT less than 3 seconds Nails hypertrophic, dystrophic, elongated, brittle, discolored 10. There is tenderness overlying the nails 1-5 bilaterally. There is no surrounding erythema or drainage along the nail sites. Hyperkeratotic lesion right foot submetatarsal 4. Upon debridement no underlying ulceration, drainage or other signs of infection. No open lesions or pre-ulcerative lesions are identified. No evidence of tinea pedis at this time.  No other areas of tenderness bilateral lower extremities. No overlying edema, erythema, increased warmth. No pain with calf compression, swelling, warmth, erythema.  Assessment: Patient presents with symptomatic onychomycosis; hyperkeratotic lesion  Plan: -Treatment options including alternatives, risks, complications were discussed -Nails sharply debrided 10 without complication/bleeding. -hyperkeratotic lesion sharply debrided x1 without complication/bleeding. Offloading pads made. -Discussed daily foot inspection. If there are any changes, to call the office immediately.  -Follow-up in 3 months or sooner if any problems are to arise. In the meantime, encouraged to call the office with any questions, concerns, changes symptoms.   Ovid CurdMatthew Wagoner, DPM

## 2015-06-08 ENCOUNTER — Ambulatory Visit: Payer: MEDICARE | Admitting: Podiatry

## 2015-06-09 ENCOUNTER — Other Ambulatory Visit: Payer: Self-pay | Admitting: Podiatry

## 2015-06-09 ENCOUNTER — Ambulatory Visit (INDEPENDENT_AMBULATORY_CARE_PROVIDER_SITE_OTHER): Payer: MEDICARE

## 2015-06-09 ENCOUNTER — Encounter: Payer: Self-pay | Admitting: Podiatry

## 2015-06-09 ENCOUNTER — Ambulatory Visit (INDEPENDENT_AMBULATORY_CARE_PROVIDER_SITE_OTHER): Payer: MEDICARE | Admitting: Podiatry

## 2015-06-09 VITALS — BP 126/52 | HR 74 | Resp 18

## 2015-06-09 DIAGNOSIS — R52 Pain, unspecified: Secondary | ICD-10-CM

## 2015-06-09 DIAGNOSIS — B351 Tinea unguium: Secondary | ICD-10-CM

## 2015-06-09 DIAGNOSIS — S82401A Unspecified fracture of shaft of right fibula, initial encounter for closed fracture: Secondary | ICD-10-CM | POA: Diagnosis not present

## 2015-06-09 DIAGNOSIS — E1149 Type 2 diabetes mellitus with other diabetic neurological complication: Secondary | ICD-10-CM

## 2015-06-09 DIAGNOSIS — L84 Corns and callosities: Secondary | ICD-10-CM | POA: Diagnosis not present

## 2015-06-09 DIAGNOSIS — M79673 Pain in unspecified foot: Secondary | ICD-10-CM | POA: Diagnosis not present

## 2015-06-11 NOTE — Progress Notes (Signed)
Patient ID: Cheryl Rios, female   DOB: 1926/01/23, 82 y.o.   MRN: [id number] Subjective: 82 y.o. returns the office today forconcerns the rightankle and foot swelling and pain which has been ongoing for approximate one week and his been progressive. She states that she is having difficulty weightbearing to her right side due to the pain. She denies any recent injury or trauma. She denies any change or increase in activity. No tingling or numbness. She is also asking her nails to be trimmed in calluses as they are thickened elongated and causing pain as well. Denies any surrounding redness or drainage. No open sores. Denies any acute changes since last appointment and no new complaints today. Denies any systemic complaints such as fevers, chills, nausea, vomiting.   Objective: AAO 3, NAD DP/PT pulses palpable, CRT less than 3 seconds There is tenderness palpation to the acid aspect of the right foot and is also tenderness mostly localized overlying the lateral malleolus of the right ankle distally. There is no pain to the medial malleolus. No proximal tib-fib pain. Diffuse tenderness on the right foot however no specific area of tenderness. There is edema to theright ankle and the dorsal foot. No ecchymosis. No erythema or increase in warmth. Nails hypertrophic, dystrophic, elongated, brittle, discolored 10. There is tenderness overlying the nails 1-5 bilaterally. There is no surrounding erythema or drainage along the nail sites. Hyperkeratotic lesion right foot submetatarsal 4. Upon debridement no underlying ulceration, drainage or other signs of infection. No open lesions or pre-ulcerative lesions are identified. No other areas of tenderness bilateral lower extremities. No overlying edema, erythema, increased warmth. No pain with calf compression, swelling, warmth, erythema.  Assessment: Patient presents with possible right lateral malleolus fracture, symptomaticonychomycosis; hyperkeratotic  lesion  Plan: -Treatment options including alternatives, risks, complications were discussed -X-rays were obtained and reviewed with the patient. It is very questionable radiolucency in the distal aspect of the fibula concerning for possible fracture or stress fracture. Osteopenia is present. Because of this and given her inability to bear weight we'll recommend immobilization in the cam boot. She states that she has a cam boot at home. Discussed gout and other pathologies however we'll hold off on treatment for that or other issues ifsymptoms are not resolving. -Nails sharply debrided 80 without complication/bleeding. -Hyperkeratotic lesion sharply debrided x1 without complication/bleeding. Offloading pads made. -Discussed daily foot inspection. If there are any changes, to call the office immediately.  -Follow-up as scheduled  or sooner if any problems are to arise. In the meantime, encouraged to call the office with any questions, concerns, changes symptoms.   *she does not have a cam boot at home when she got home and she presented to the office on Thursday, January 19 to have a cam boot. This was fitted and dispensed by the medical assistant.  Ovid Curd, DPM

## 2015-06-23 ENCOUNTER — Ambulatory Visit (INDEPENDENT_AMBULATORY_CARE_PROVIDER_SITE_OTHER): Payer: MEDICARE

## 2015-06-23 ENCOUNTER — Encounter: Payer: Self-pay | Admitting: Podiatry

## 2015-06-23 ENCOUNTER — Ambulatory Visit (INDEPENDENT_AMBULATORY_CARE_PROVIDER_SITE_OTHER): Payer: MEDICARE | Admitting: Podiatry

## 2015-06-23 DIAGNOSIS — R609 Edema, unspecified: Secondary | ICD-10-CM

## 2015-06-23 DIAGNOSIS — M25571 Pain in right ankle and joints of right foot: Secondary | ICD-10-CM

## 2015-06-23 DIAGNOSIS — S82401D Unspecified fracture of shaft of right fibula, subsequent encounter for closed fracture with routine healing: Secondary | ICD-10-CM

## 2015-06-23 DIAGNOSIS — R52 Pain, unspecified: Secondary | ICD-10-CM

## 2015-06-23 NOTE — Progress Notes (Signed)
Patient ID: Cheryl Rios, female   DOB: June 08, 1925, 82 y.o.   MRN: [id number]  Subjective: 82 year old female presents the office today for follow-up evaluation of right ankle pain. She states that she has been wearing the boot at home although she does not work today she was driving. She states her pain has decreased her right ankle although the swelling does continue.  She did not take her fluid pill this morning and is why her foot is more swollen today. She denies any erythema or increased warmth. Her pain has subsided and she feels that she has improved. No other complaints at this time. No recent injury or trauma.  Objective: AAO 3, NAD DP/PT pulses palpable, CRT less than 3 seconds Varicose veins are present. There is decreased tenderness to palpation overlying the right ankle on the lateral malleolus. There is no pain of the foot. There is no other areas of pinpoint bony tenderness or pain vibratory sensation. There is mild edema to the right ankle although his chronic edema bilaterally. There is no overlying erythema or increase in warmth. There is no pain or restriction ankle, subtalar joint range of motion. Open lesions or pre-ulcer lesions. There is no pain to calf compression, swelling, warmth, erythema.  Assessment:  82 year old female with resolving right ankle pain, swelling  Plan: -Treatment options discussed including all alternatives, risks, and complications -X-rays were obtained and reviewed with the patient.  Today there is no definitive evidence of acute fracture or stress surgery. The area previous a visualized and split is not well visualized today. -At this time she can continue with regular, supportive shoe gear. -Tubagrip was applied to help with swelling -Recommended follow-up in 4 weeks' have her she wishes to hold off until she toenails trimmed and time. I'll see her back in 7 weeks. At that time if she still having pain we'll re-x-ray. I encouraged her that if  there is any increase in pain or change in symptoms to call the office immediately.  Ovid Curd, DPM

## 2015-06-23 NOTE — Patient Instructions (Signed)
I will see you back in 7 weeks. If your ankle pain worsens go back into the [place] and call the office. Otherwise, I will see you back in 7 weeks.

## 2015-07-03 ENCOUNTER — Ambulatory Visit: Payer: MEDICARE

## 2015-07-07 ENCOUNTER — Ambulatory Visit: Payer: MEDICARE

## 2015-07-07 ENCOUNTER — Ambulatory Visit: Payer: MEDICARE | Admitting: Sports Medicine

## 2015-08-13 ENCOUNTER — Encounter: Payer: Self-pay | Admitting: Podiatry

## 2015-08-13 ENCOUNTER — Telehealth: Payer: Self-pay | Admitting: *Deleted

## 2015-08-13 ENCOUNTER — Ambulatory Visit (INDEPENDENT_AMBULATORY_CARE_PROVIDER_SITE_OTHER): Payer: MEDICARE

## 2015-08-13 ENCOUNTER — Ambulatory Visit (INDEPENDENT_AMBULATORY_CARE_PROVIDER_SITE_OTHER): Payer: MEDICARE | Admitting: Podiatry

## 2015-08-13 VITALS — BP 107/40 | HR 69 | Resp 18

## 2015-08-13 DIAGNOSIS — M79676 Pain in unspecified toe(s): Secondary | ICD-10-CM

## 2015-08-13 DIAGNOSIS — R52 Pain, unspecified: Secondary | ICD-10-CM

## 2015-08-13 DIAGNOSIS — I872 Venous insufficiency (chronic) (peripheral): Secondary | ICD-10-CM | POA: Diagnosis not present

## 2015-08-13 DIAGNOSIS — B351 Tinea unguium: Secondary | ICD-10-CM

## 2015-08-13 DIAGNOSIS — M25471 Effusion, right ankle: Secondary | ICD-10-CM | POA: Diagnosis not present

## 2015-08-13 DIAGNOSIS — I82409 Acute embolism and thrombosis of unspecified deep veins of unspecified lower extremity: Secondary | ICD-10-CM

## 2015-08-13 NOTE — Telephone Encounter (Addendum)
-----   Message from Vivi BarrackMatthew R Wagoner, DPM sent at 08/13/2015  9:41 AM EDT ----- Can you order a venous duplex to rule out DVT. She has bilateral leg swelling. Orders faxed to [facility].  08/18/2015-DrArdelle Anton. Wagoner reviewed 08/17/2015 venous dopplers as negative for DVT. Informed pt of Dr. Gabriel RungWagoner's review of results and transferred pt to scheduling.

## 2015-08-13 NOTE — Progress Notes (Signed)
Patient ID: Cheryl Rios, female   DOB: 1926/04/08, 82 y.o.   MRN: [id number]  Subjective: 82 year old female presents the office today for thick, painful, elongated tenderness which she cannot trim herself. Denies any surrounding redness or drainage. Also presents today for right ankle and leg swelling bilaterally. She states the pain that she is having previously has resolved however she does continue to get swelling. She denies any calf pain. No claudication symptoms. No other complaints at this time.  Objective: AAO 3, NAD DP/PT pulses palpable however PT somewhat decreased, CRT less than 3 seconds Varicose veins are present. At this time there is no tenderness palpation along the distal aspect of the fibular tethers of the ankle. Ankle joint range of motion is intact. There is continued swelling to the right ankle how there is no erythema, increase in warmth. There is no fluctuance or crepitus. There is bilateral lower extremity edema present which appears to be chronic. Nails are hypertrophic, dystrophic, brittle, discolored, elongated 10. There is no swelling erythema or drainage. Tenderness nails 1-5 bilaterally. No open lesions identified bilaterally. There is no pain with calf compression, erythema, warmth.  Assessment:  82 year old female with resolving right ankle pain, swelling  Plan: -Treatment options discussed including all alternatives, risks, and complications -X-rays were obtained and reviewed with the patient.  No evidence of acute fracture or stress fracture identified at this time. -To evaluate the swelling will obtain vascular studies including arterial and venous studies. This is likely due to venous insufficiency. -Nails debrided 10 without complications or bleeding. -Daily foot inspection. -Follow-up after vascular studies or sooner if any issues are to arise. Call any questions or concerns in the meantime.  Ovid CurdMatthew Wagoner, DPM

## 2015-09-01 ENCOUNTER — Encounter: Payer: Self-pay | Admitting: Podiatry

## 2015-09-01 ENCOUNTER — Ambulatory Visit (INDEPENDENT_AMBULATORY_CARE_PROVIDER_SITE_OTHER): Payer: MEDICARE | Admitting: Podiatry

## 2015-09-01 VITALS — BP 126/42 | HR 67 | Resp 18

## 2015-09-01 DIAGNOSIS — M25471 Effusion, right ankle: Secondary | ICD-10-CM | POA: Diagnosis not present

## 2015-09-01 DIAGNOSIS — M779 Enthesopathy, unspecified: Secondary | ICD-10-CM | POA: Diagnosis not present

## 2015-09-01 NOTE — Progress Notes (Signed)
Patient ID: Cheryl Rios, female  Cheryl Rios DOB: [birth year], 82 y.o.   MRN: [id number]  Subjective: 82 year old female presents the office today for follow-up evaluation of right ankle pain and swelling. She states that it has improved to some degree but is still painful and somewhat swollen. She's had a venous duplex was negative for DVT. No recent injury or trauma. No warmth or redness to the foot. No other complaints at this time. She denies any systemic complaints as fevers, chills, nausea, vomiting. No calf pain, chest pain, shortness of breath.  Objective: AAO 3, NAD DP/PT pulses palpable however PT somewhat decreased (likely due to swelling), CRT less than 3 seconds Varicose veins are present. At this time there is no tenderness palpation along the distal aspect of the fibular tethers of the ankle. Ankle joint range of motion is intact. There is swelling overlying the ankle. Today the majority symptoms were be localized to the lateral aspect of the foot on the sinus tarsi. There is no area pinpoint bony tenderness there is no pain vibratory sensation. There is no overlying erythema or increase in warmth. No pain with subtalar joint range of motion.  No open lesions identified bilaterally. There is no pain with calf compression, erythema, warmth.  Assessment:  82 year old female with right ankle swelling, STJ capsulitis.   Plan: -Treatment options discussed including all alternatives, risks, and complications -I discussed a steroid injection into the sinus tarsi as this is with the majority of her symptoms are localized. I discussed risks and complications which she understands and wishes to proceed with the injection. Under sterile conditions a mixture of Kenalog as well as local anesthetic was infiltrated into the lateral aspect of the foot on the sinus tarsi without complications. Post injection care was discussed. -Ace bandages were used to help with compression and help with  swelling. -Elevation. -Follow-up in 2-3 weeks if symptoms persist otherwise I'll see her back in 7 weeks for routine care. Call any questions concerns in the meantime.  Ovid CurdMatthew , DPM

## 2015-09-01 NOTE — Patient Instructions (Signed)
I will see you back in 3 week for your right ankle if it is still hurting. Otherwise, I will see you back on May 30th for your nail trim.  If you have any questions, please feel free to call me.

## 2015-10-20 ENCOUNTER — Ambulatory Visit (INDEPENDENT_AMBULATORY_CARE_PROVIDER_SITE_OTHER): Payer: MEDICARE | Admitting: Podiatry

## 2015-10-20 ENCOUNTER — Encounter: Payer: Self-pay | Admitting: Podiatry

## 2015-10-20 DIAGNOSIS — L84 Corns and callosities: Secondary | ICD-10-CM | POA: Diagnosis not present

## 2015-10-20 DIAGNOSIS — B351 Tinea unguium: Secondary | ICD-10-CM | POA: Diagnosis not present

## 2015-10-20 DIAGNOSIS — M79676 Pain in unspecified toe(s): Secondary | ICD-10-CM | POA: Diagnosis not present

## 2015-10-20 NOTE — Progress Notes (Signed)
Patient ID: Idalia NeedleDoris Azer, female   DOB: 30-Mar-1926, 82 y.o.   MRN: [id number]  Subjective: 82 y.o. returns the office today for painful, elongated, thickened toenails which she cannot trim herself. Denies any redness or drainage around the nails. She states the right ankle is doing better and the swelling has improved. Denies any acute changes since last appointment and no new complaints today. Denies any systemic complaints such as fevers, chills, nausea, vomiting.   Objective: AAO 3, NAD DP/PT pulses palpable, CRT less than 3 seconds Nails hypertrophic, dystrophic, elongated, brittle, discolored 10. There is tenderness overlying the nails 1-5 bilaterally. There is no surrounding erythema or drainage along the nail sites. Hyperkeratotic lesion right foot to metatarsal 4. Upon debridement no underlying ulceration, drainage or other signs of infection. No open lesions or pre-ulcerative lesions are identified. Discontinuation tenderness on the sinus tarsi on the right foot however this does appear to be improved compared to last appointment. There is significant improved edema without any significant erythema. Subtalar joint range of motion limited. No other areas of tenderness bilateral lower extremities. No overlying edema, erythema, increased warmth. No pain with calf compression, swelling, warmth, erythema.  Assessment: Patient presents with symptomatic onychomycosis, porokeratosis, since the joint arthritis, capsulitis  Plan: -Treatment options including alternatives, risks, complications were discussed -Nails sharply debrided 10 without complication/bleeding. -Hyperkeratotic lesion debrided 1 without complications or bleeding -Continue supportive shoe brace if needed. She wishes to hold off on any further steroid injection. -Discussed daily foot inspection. If there are any changes, to call the office immediately.  -Follow-up in 3 months or sooner if any problems are to arise.  Follow-up sooner if the right ankle continues to be problematic at next couple weeks. In the meantime, encouraged to call the office with any questions, concerns, changes symptoms.  Ovid CurdMatthew Wagoner, DPM

## 2015-12-12 ENCOUNTER — Other Ambulatory Visit: Payer: Self-pay | Admitting: Podiatry

## 2016-01-19 ENCOUNTER — Ambulatory Visit (INDEPENDENT_AMBULATORY_CARE_PROVIDER_SITE_OTHER): Payer: MEDICARE | Admitting: Podiatry

## 2016-01-19 DIAGNOSIS — M79676 Pain in unspecified toe(s): Secondary | ICD-10-CM

## 2016-01-19 DIAGNOSIS — L84 Corns and callosities: Secondary | ICD-10-CM | POA: Diagnosis not present

## 2016-01-19 DIAGNOSIS — B351 Tinea unguium: Secondary | ICD-10-CM

## 2016-01-19 NOTE — Progress Notes (Signed)
Patient ID: Cheryl Rios, female   DOB: 25-Jul-1925, 82 y.o.   MRN: [id number]  Subjective: 82 y.o. returns the office today for painful, elongated, thickened toenails which she cannot trim herself. Denies any redness or drainage around the nails. No pain to her ankle. She has a painful callus on the right foot. Denies any acute changes since last appointment and no new complaints today. Denies any systemic complaints such as fevers, chills, nausea, vomiting.   Objective: AAO 3, NAD DP/PT pulses palpable, CRT less than 3 seconds Nails hypertrophic, dystrophic, elongated, brittle, discolored 10. There is tenderness overlying the nails 1-5 bilaterally. There is no surrounding erythema or drainage along the nail sites. Diffuse hyperkeratotic lesion right foot to metatarsal 4. Upon debridement no underlying ulceration, drainage or other signs of infection. No open lesions or other pre-ulcerative lesions are identified. No other areas of tenderness bilateral lower extremities. No overlying edema, erythema, increased warmth. No pain with calf compression, swelling, warmth, erythema.  Assessment: Patient presents with symptomatic onychomycosis, porokeratosis  Plan: -Treatment options including alternatives, risks, complications were discussed -Nails sharply debrided 10 without complication/bleeding. -Hyperkeratotic lesion debrided 1 without complications or bleeding. Offloading pads. -Discussed daily foot inspection. If there are any changes, to call the office immediately.  -Follow-up in 3 months or sooner if any problems are to arise. Follow-up sooner if the right ankle continues to be problematic at next couple weeks. In the meantime, encouraged to call the office with any questions, concerns, changes symptoms.  Ovid CurdMatthew Wagoner, DPM

## 2016-02-11 ENCOUNTER — Other Ambulatory Visit: Payer: Self-pay | Admitting: Family Medicine

## 2016-02-11 DIAGNOSIS — Z1231 Encounter for screening mammogram for malignant neoplasm of breast: Secondary | ICD-10-CM

## 2016-03-03 ENCOUNTER — Ambulatory Visit: Payer: Self-pay

## 2016-03-07 ENCOUNTER — Ambulatory Visit
Admission: RE | Admit: 2016-03-07 | Discharge: 2016-03-07 | Disposition: A | Discharge status: Home / Self Care | Payer: MEDICARE | Source: Ambulatory Visit | Attending: Family Medicine | Admitting: Family Medicine

## 2016-03-07 DIAGNOSIS — Z1231 Encounter for screening mammogram for malignant neoplasm of breast: Secondary | ICD-10-CM | POA: Diagnosis present

## 2016-04-25 ENCOUNTER — Encounter: Payer: Self-pay | Admitting: Podiatry

## 2016-04-25 ENCOUNTER — Ambulatory Visit (INDEPENDENT_AMBULATORY_CARE_PROVIDER_SITE_OTHER): Payer: MEDICARE | Admitting: Podiatry

## 2016-04-25 VITALS — Ht 62.0 in | Wt 188.0 lb

## 2016-04-25 DIAGNOSIS — Q828 Other specified congenital malformations of skin: Secondary | ICD-10-CM | POA: Diagnosis not present

## 2016-04-25 DIAGNOSIS — M79676 Pain in unspecified toe(s): Secondary | ICD-10-CM

## 2016-04-25 DIAGNOSIS — E1149 Type 2 diabetes mellitus with other diabetic neurological complication: Secondary | ICD-10-CM

## 2016-04-25 DIAGNOSIS — B351 Tinea unguium: Secondary | ICD-10-CM

## 2016-04-25 DIAGNOSIS — L84 Corns and callosities: Secondary | ICD-10-CM

## 2016-04-25 NOTE — Progress Notes (Addendum)
Complaint:  Visit Type: Patient returns to my office for continued preventative foot care services. Complaint: Patient states" my nails have grown long and thick and become painful to walk and wear shoes" Patient has been diagnosed with DM with neuropathy.. The patient presents for preventative foot care services. No changes to ROS  Podiatric Exam: Vascular: dorsalis pedis and posterior tibial pulses are palpable bilateral. Capillary return is immediate. Temperature gradient is WNL. Skin turgor WNL  Sensorium: Normal Semmes Weinstein monofilament test. Normal tactile sensation bilaterally. Nail Exam: Pt has thick disfigured discolored nails with subungual debris noted bilateral entire nail hallux through fifth toenails Ulcer Exam: There is no evidence of ulcer or pre-ulcerative changes or infection. Orthopedic Exam: Muscle tone and strength are WNL. No limitations in general ROM. No crepitus or effusions noted. Foot type and digits show no abnormalities. Bony prominences are unremarkable. Swelling feet  B/L Skin:  Porokeratosis sub 4th right.  Diffuse plantar tyloma.. No infection or ulcers  Diagnosis:  Onychomycosis, , Pain in right toe, pain in left toes. Porokeratosis right foot  Treatment & Plan Procedures and Treatment: Consent by patient was obtained for treatment procedures. The patient understood the discussion of treatment and procedures well. All questions were answered thoroughly reviewed. Debridement of mycotic and hypertrophic toenails, 1 through 5 bilateral and clearing of subungual debris. No ulceration, no infection noted. Debride porokeratosis. Return Visit-Office Procedure: Patient instructed to return to the office for a follow up visit 3 months for continued evaluation and treatment.    Helane GuntherGregory  DPM

## 2016-04-26 ENCOUNTER — Ambulatory Visit: Payer: MEDICARE | Admitting: Podiatry

## 2016-07-12 ENCOUNTER — Encounter: Payer: Self-pay | Admitting: *Deleted

## 2016-07-19 ENCOUNTER — Ambulatory Visit: Payer: MEDICARE | Admitting: Anesthesiology

## 2016-07-19 ENCOUNTER — Encounter
Admission: RE | Disposition: A | Discharge status: Home / Self Care | Payer: Self-pay | Source: Ambulatory Visit | Attending: Ophthalmology

## 2016-07-19 ENCOUNTER — Encounter: Payer: Self-pay | Admitting: *Deleted

## 2016-07-19 ENCOUNTER — Ambulatory Visit
Admission: RE | Admit: 2016-07-19 | Discharge: 2016-07-19 | Disposition: A | Discharge status: Home / Self Care | Payer: MEDICARE | Source: Ambulatory Visit | Attending: Ophthalmology | Admitting: Ophthalmology

## 2016-07-19 DIAGNOSIS — G4730 Sleep apnea, unspecified: Secondary | ICD-10-CM | POA: Insufficient documentation

## 2016-07-19 DIAGNOSIS — E1136 Type 2 diabetes mellitus with diabetic cataract: Secondary | ICD-10-CM | POA: Diagnosis not present

## 2016-07-19 DIAGNOSIS — Z79899 Other long term (current) drug therapy: Secondary | ICD-10-CM | POA: Diagnosis not present

## 2016-07-19 DIAGNOSIS — I10 Essential (primary) hypertension: Secondary | ICD-10-CM | POA: Diagnosis not present

## 2016-07-19 DIAGNOSIS — E039 Hypothyroidism, unspecified: Secondary | ICD-10-CM | POA: Insufficient documentation

## 2016-07-19 HISTORY — DX: Localized edema: R60.0

## 2016-07-19 HISTORY — DX: Cardiac arrhythmia, unspecified: I49.9

## 2016-07-19 HISTORY — DX: Polyneuropathy, unspecified: G62.9

## 2016-07-19 HISTORY — PX: CATARACT EXTRACTION W/PHACO: SHX586

## 2016-07-19 HISTORY — DX: Unspecified osteoarthritis, unspecified site: M19.90

## 2016-07-19 HISTORY — DX: Diverticulosis of intestine, part unspecified, without perforation or abscess without bleeding: K57.90

## 2016-07-19 HISTORY — DX: Disorder of thyroid, unspecified: E07.9

## 2016-07-19 HISTORY — DX: Sleep apnea, unspecified: G47.30

## 2016-07-19 HISTORY — DX: Unspecified hearing loss, unspecified ear: H91.90

## 2016-07-19 LAB — GLUCOSE, CAPILLARY: GLUCOSE-CAPILLARY: 107 mg/dL — AB (ref 65–99)

## 2016-07-19 SURGERY — PHACOEMULSIFICATION, CATARACT, WITH IOL INSERTION
Anesthesia: Monitor Anesthesia Care | Site: Eye | Laterality: Right | Wound class: Clean

## 2016-07-19 MED ORDER — CEFUROXIME OPHTHALMIC INJECTION 1 MG/0.1 ML
INJECTION | OPHTHALMIC | Status: AC
  Filled 2016-07-19: qty 0.1

## 2016-07-19 MED ORDER — LIDOCAINE HCL (PF) 4 % IJ SOLN
INTRAMUSCULAR | Status: DC | PRN
  Administered 2016-07-19: 2.25 mL via OPHTHALMIC

## 2016-07-19 MED ORDER — SEVOFLURANE IN SOLN
RESPIRATORY_TRACT | Status: AC
  Filled 2016-07-19: qty 250

## 2016-07-19 MED ORDER — NA HYALUR & NA CHOND-NA HYALUR 0.4-0.35 ML IO KIT
PACK | INTRAOCULAR | Status: DC | PRN
  Administered 2016-07-19: 1 mL via INTRAOCULAR

## 2016-07-19 MED ORDER — EPINEPHRINE PF 1 MG/ML IJ SOLN
INTRAOCULAR | Status: DC | PRN
  Administered 2016-07-19: 1 mL via OPHTHALMIC

## 2016-07-19 MED ORDER — MOXIFLOXACIN HCL 0.5 % OP SOLN
1.0000 [drp] | Freq: Once | OPHTHALMIC | Status: AC
  Administered 2016-07-19 (×3): 1 [drp] via OPHTHALMIC

## 2016-07-19 MED ORDER — MOXIFLOXACIN HCL 0.5 % OP SOLN
OPHTHALMIC | Status: DC | PRN
  Administered 2016-07-19: .2 mL via OPHTHALMIC

## 2016-07-19 MED ORDER — NA HYALUR & NA CHOND-NA HYALUR 0.55-0.5 ML IO KIT
PACK | INTRAOCULAR | Status: AC
  Filled 2016-07-19: qty 1.05

## 2016-07-19 MED ORDER — ARMC OPHTHALMIC DILATING DROPS
1.0000 "application " | Freq: Once | OPHTHALMIC | Status: AC
  Administered 2016-07-19 (×3): 1 via OPHTHALMIC

## 2016-07-19 MED ORDER — FENTANYL CITRATE (PF) 100 MCG/2ML IJ SOLN
INTRAMUSCULAR | Status: AC
  Filled 2016-07-19: qty 2

## 2016-07-19 MED ORDER — MOXIFLOXACIN HCL 0.5 % OP SOLN
OPHTHALMIC | Status: AC
  Administered 2016-07-19: 1 [drp] via OPHTHALMIC
  Filled 2016-07-19: qty 3

## 2016-07-19 MED ORDER — ARMC OPHTHALMIC DILATING DROPS
OPHTHALMIC | Status: AC
  Administered 2016-07-19: 1 via OPHTHALMIC
  Filled 2016-07-19: qty 0.4

## 2016-07-19 MED ORDER — FENTANYL CITRATE (PF) 100 MCG/2ML IJ SOLN
INTRAMUSCULAR | Status: DC | PRN
  Administered 2016-07-19: 25 ug via INTRAVENOUS

## 2016-07-19 MED ORDER — NEOMYCIN-POLYMYXIN-DEXAMETH 3.5-10000-0.1 OP OINT
TOPICAL_OINTMENT | OPHTHALMIC | Status: AC
  Filled 2016-07-19: qty 3.5

## 2016-07-19 MED ORDER — SODIUM CHLORIDE 0.9 % IV SOLN
INTRAVENOUS | Status: DC
  Administered 2016-07-19 (×2): via INTRAVENOUS

## 2016-07-19 MED ORDER — NEOMYCIN-POLYMYXIN-DEXAMETH 0.1 % OP OINT
TOPICAL_OINTMENT | OPHTHALMIC | Status: DC | PRN
  Administered 2016-07-19: 1 via OPHTHALMIC

## 2016-07-19 MED ORDER — POVIDONE-IODINE 5 % OP SOLN
OPHTHALMIC | Status: DC | PRN
  Administered 2016-07-19: 1 via OPHTHALMIC

## 2016-07-19 MED ORDER — EPINEPHRINE PF 1 MG/ML IJ SOLN
INTRAMUSCULAR | Status: AC
Start: 2016-07-19 — End: 2016-07-19
  Filled 2016-07-19: qty 2

## 2016-07-19 MED ORDER — CARBACHOL 0.01 % IO SOLN
INTRAOCULAR | Status: DC | PRN
  Administered 2016-07-19: .5 mL via INTRAOCULAR

## 2016-07-19 SURGICAL SUPPLY — 21 items
CANNULA ANT/CHMB 27GA (MISCELLANEOUS) ×3 IMPLANT
CUP MEDICINE 2OZ PLAST GRAD ST (MISCELLANEOUS) ×3 IMPLANT
GLOVE BIO SURGEON STRL SZ8 (GLOVE) ×3 IMPLANT
GLOVE BIOGEL M 6.5 STRL (GLOVE) ×3 IMPLANT
GLOVE SURG LX 7.5 STRW (GLOVE) ×2
GLOVE SURG LX STRL 7.5 STRW (GLOVE) ×1 IMPLANT
GOWN STRL REUS W/ TWL LRG LVL3 (GOWN DISPOSABLE) ×2 IMPLANT
GOWN STRL REUS W/TWL LRG LVL3 (GOWN DISPOSABLE) ×4
LENS IOL TECNIS ITEC 22.5 (Intraocular Lens) ×3 IMPLANT
PACK CATARACT (MISCELLANEOUS) ×3 IMPLANT
PACK CATARACT BRASINGTON LX (MISCELLANEOUS) ×3 IMPLANT
PACK EYE AFTER SURG (MISCELLANEOUS) ×3 IMPLANT
SOL BSS BAG (MISCELLANEOUS) ×3
SOL PREP PVP 2OZ (MISCELLANEOUS) ×3
SOLUTION BSS BAG (MISCELLANEOUS) ×1 IMPLANT
SOLUTION PREP PVP 2OZ (MISCELLANEOUS) ×1 IMPLANT
SYR 3ML LL SCALE MARK (SYRINGE) ×3 IMPLANT
SYR 5ML LL (SYRINGE) ×3 IMPLANT
SYR TB 1ML 27GX1/2 LL (SYRINGE) ×3 IMPLANT
WATER STERILE IRR 250ML POUR (IV SOLUTION) ×3 IMPLANT
WIPE NON LINTING 3.25X3.25 (MISCELLANEOUS) ×3 IMPLANT

## 2016-07-19 NOTE — Anesthesia Preprocedure Evaluation (Signed)
Anesthesia Evaluation  Patient identified by MRN, date of birth, ID band Patient awake    Reviewed: Allergy & Precautions, NPO status , Patient's Chart, lab work & pertinent test results  Airway Mallampati: III       Dental   Pulmonary sleep apnea ,           Cardiovascular hypertension, Pt. on medications and Pt. on home beta blockers      Neuro/Psych    GI/Hepatic negative GI ROS, Neg liver ROS,   Endo/Other  diabetes (no meds)Hypothyroidism   Renal/GU negative Renal ROS     Musculoskeletal   Abdominal   Peds  Hematology   Anesthesia Other Findings   Reproductive/Obstetrics                             Anesthesia Physical Anesthesia Plan  ASA: III  Anesthesia Plan: MAC   Post-op Pain Management:    Induction:   Airway Management Planned:   Additional Equipment:   Intra-op Plan:   Post-operative Plan:   Informed Consent: I have reviewed the patients History and Physical, chart, labs and discussed the procedure including the risks, benefits and alternatives for the proposed anesthesia with the patient or authorized representative who has indicated his/her understanding and acceptance.     Plan Discussed with:   Anesthesia Plan Comments:         Anesthesia Quick Evaluation

## 2016-07-19 NOTE — H&P (Signed)
The History and Physical notes are on paper, have been signed, and are to be scanned. The patient remains stable and unchanged from the H&P.   Previous H&P reviewed, patient examined, and there are no changes.  , 07/19/2016 9:58 AM

## 2016-07-19 NOTE — Op Note (Signed)
OPERATIVE NOTE  Rickard RhymesDoris M Thom [id number] 07/19/2016   PREOPERATIVE DIAGNOSIS:  Nuclear Sclerotic Cataract Right Eye H25.11   POSTOPERATIVE DIAGNOSIS: Nuclear Sclerotic Cataract Right Eye H25.11          PROCEDURE:  Phacoemusification with posterior chamber intraocular lens placement of the right eye   LENS:   Implant Name Type Inv. Item Serial No. Manufacturer Lot No. LRB No. Used  LENS IOL DIOP 22.5 - [id number] Intraocular Lens LENS IOL DIOP 22.5 [id number]   Right 1       ULTRASOUND TIME: 14 %  of 0 minutes 56 seconds, CDE 9.5  SURGEON:  Deirdre Evenerhadwick R. , MD   ANESTHESIA:  Topical with tetracaine drops and 2% Xylocaine jelly, augmented with 1% preservative-free intracameral lidocaine.    COMPLICATIONS:  None.   DESCRIPTION OF PROCEDURE:  The patient was identified in the holding room and transported to the operating room and placed in the supine position under the operating microscope. Theright eye was identified as the operative eye and it was prepped and draped in the usual sterile ophthalmic fashion.   A 1 millimeter clear-corneal paracentesis was made at the 12:00 position.  0.5 ml of preservative-free 1% lidocaine was injected into the anterior chamber. The anterior chamber was filled with Viscoat viscoelastic.  A 2.4 millimeter keratome was used to make a near-clear corneal incision at the 9:00 position. A curvilinear capsulorrhexis was made with a cystotome and capsulorrhexis forceps.  Balanced salt solution was used to hydrodissect and hydrodelineate the nucleus.   Phacoemulsification was then used in stop and chop fashion to remove the lens nucleus and epinucleus.  The remaining cortex was then removed using the irrigation and aspiration handpiece. Provisc was then placed into the capsular bag to distend it for lens placement.  A lens was then injected into the capsular bag.  The remaining viscoelastic was aspirated.  Wounds were hydrated with balanced salt  solution.  The anterior chamber was inflated to a physiologic pressure with balanced salt solution. Vigamox 0.2 ml of a 1mg  per ml solution was injected into the anterior chamber for a dose of 0.2 mg of intracameral antibiotic at the completion of the case. Miostat was placed into the anterior chamber to constrict the pupil.  No wound leaks were noted.  Topical Vigamox drops and Maxitrol ointment were applied to the eye.  The patient was taken to the recovery room in stable condition without complications of anesthesia or surgery.  , 07/19/2016, 11:13 AM

## 2016-07-19 NOTE — Anesthesia Procedure Notes (Signed)
Procedure Name: MAC Date/Time: 07/19/2016 11:00 AM Performed by: Allean Found Pre-anesthesia Checklist: Patient identified, Emergency Drugs available, Suction available, Patient being monitored and Timeout performed Oxygen Delivery Method: Nasal cannula Placement Confirmation: positive ETCO2

## 2016-07-19 NOTE — Anesthesia Post-op Follow-up Note (Cosign Needed)
Anesthesia QCDR form completed.        

## 2016-07-19 NOTE — Transfer of Care (Signed)
Immediate Anesthesia Transfer of Care Note  Patient: Cheryl Rios  Procedure(s) Performed: Procedure(s) with comments: CATARACT EXTRACTION PHACO AND INTRAOCULAR LENS PLACEMENT (IOC) (Right) - [place] 0:55.8 AP% 14.3 CDE 9.52 Fluid pack lot # [id number]  Patient Location: PACU  Anesthesia Type:MAC  Level of Consciousness: awake  Airway & Oxygen Therapy: Patient Spontanous Breathing  Post-op Assessment: Report given to RN and Post -op Vital signs reviewed and stable  Post vital signs: Reviewed and stable  Last Vitals:  Vitals:   07/19/16 0959  BP: (!) 183/73  Pulse: 71  Resp: 16  Temp: 36.6 C    Last Pain:  Vitals:   07/19/16 0959  TempSrc: Oral         Complications: No apparent anesthesia complications

## 2016-07-19 NOTE — Discharge Instructions (Signed)
Eye Surgery Discharge Instructions  Expect mild scratchy sensation or mild soreness. DO NOT RUB YOUR EYE!  The day of surgery:  Minimal physical activity, but bed rest is not required  No reading, computer work, or close hand work  No bending, lifting, or straining.  May watch TV  For 24 hours:  No driving, legal decisions, or alcoholic beverages  Safety precautions  Eat anything you prefer: It is better to start with liquids, then soup then solid foods.  _____ Eye patch should be worn until postoperative exam tomorrow.  ____ Solar shield eyeglasses should be worn for comfort in the sunlight/patch while sleeping  Resume all regular medications including aspirin or Coumadin if these were discontinued prior to surgery. You may shower, bathe, shave, or wash your hair. Tylenol may be taken for mild discomfort.  Call your doctor if you experience significant pain, nausea, or vomiting, fever > 101 or other signs of infection. [phone] or [phone] Specific instructions:  Follow-up Information    BRASINGTON,CHADWICK, MD Follow up on 07/20/2016.   Specialty:  Ophthalmology Why:  2:00 Contact information: [address]   [place] [address] [phone]

## 2016-07-19 NOTE — Anesthesia Postprocedure Evaluation (Signed)
Anesthesia Post Note  Patient: Cheryl Rios  Procedure(s) Performed: Procedure(s) (LRB): CATARACT EXTRACTION PHACO AND INTRAOCULAR LENS PLACEMENT (IOC) (Right)  Patient location during evaluation: Other Anesthesia Type: MAC Level of consciousness: awake and alert Pain management: pain level controlled Vital Signs Assessment: post-procedure vital signs reviewed and stable Respiratory status: spontaneous breathing and respiratory function stable Cardiovascular status: stable Anesthetic complications: no     Last Vitals:  Vitals:   07/19/16 1118 07/19/16 1132  BP: (!) 150/65 (!) 159/61  Pulse: 70 64  Resp: 16 16  Temp: 36.6 C     Last Pain:  Vitals:   07/19/16 1118  TempSrc: Oral                 , K

## 2016-07-20 ENCOUNTER — Encounter: Payer: Self-pay | Admitting: Ophthalmology

## 2016-08-08 ENCOUNTER — Ambulatory Visit (INDEPENDENT_AMBULATORY_CARE_PROVIDER_SITE_OTHER): Payer: MEDICARE | Admitting: Podiatry

## 2016-08-08 ENCOUNTER — Encounter: Payer: Self-pay | Admitting: Podiatry

## 2016-08-08 DIAGNOSIS — M79676 Pain in unspecified toe(s): Secondary | ICD-10-CM

## 2016-08-08 DIAGNOSIS — L84 Corns and callosities: Secondary | ICD-10-CM

## 2016-08-08 DIAGNOSIS — E1149 Type 2 diabetes mellitus with other diabetic neurological complication: Secondary | ICD-10-CM

## 2016-08-08 DIAGNOSIS — B351 Tinea unguium: Secondary | ICD-10-CM | POA: Diagnosis not present

## 2016-08-08 NOTE — Progress Notes (Signed)
Complaint:  Visit Type: Patient returns to my office for continued preventative foot care services. Complaint: Patient states" my nails have grown long and thick and become painful to walk and wear shoes" Patient has been diagnosed with DM with neuropathy.. The patient presents for preventative foot care services. No changes to ROS  Podiatric Exam: Vascular: dorsalis pedis and posterior tibial pulses are palpable bilateral. Capillary return is immediate. Temperature gradient is WNL. Skin turgor WNL  Sensorium: Normal Semmes Weinstein monofilament test. Normal tactile sensation bilaterally. Nail Exam: Pt has thick disfigured discolored nails with subungual debris noted bilateral entire nail hallux through fifth toenails Ulcer Exam: There is no evidence of ulcer or pre-ulcerative changes or infection. Orthopedic Exam: Muscle tone and strength are WNL. No limitations in general ROM. No crepitus or effusions noted. Foot type and digits show no abnormalities. Bony prominences are unremarkable. Swelling feet  B/L Skin:  Porokeratosis sub 4th bilateral..  Diffuse plantar tyloma.. No infection or ulcers  Diagnosis:  Onychomycosis, , Pain in right toe, pain in left toes. Porokeratosis right foot  Treatment & Plan Procedures and Treatment: Consent by patient was obtained for treatment procedures. The patient understood the discussion of treatment and procedures well. All questions were answered thoroughly reviewed. Debridement of mycotic and hypertrophic toenails, 1 through 5 bilateral and clearing of subungual debris. No ulceration, no infection noted. Debride porokeratosis. Dispense toe cap right foot for 4/5 toes. Return Visit-Office Procedure: Patient instructed to return to the office for a follow up visit 3 months for continued evaluation and treatment.    Helane GuntherGregory  DPM

## 2016-08-18 ENCOUNTER — Encounter
Admission: RE | Disposition: A | Discharge status: Home / Self Care | Payer: Self-pay | Source: Ambulatory Visit | Attending: Ophthalmology

## 2016-08-18 ENCOUNTER — Ambulatory Visit
Admission: RE | Admit: 2016-08-18 | Discharge: 2016-08-18 | Disposition: A | Discharge status: Home / Self Care | Payer: MEDICARE | Source: Ambulatory Visit | Attending: Ophthalmology | Admitting: Ophthalmology

## 2016-08-18 ENCOUNTER — Ambulatory Visit: Payer: MEDICARE | Admitting: Registered Nurse

## 2016-08-18 ENCOUNTER — Encounter: Payer: Self-pay | Admitting: *Deleted

## 2016-08-18 DIAGNOSIS — E039 Hypothyroidism, unspecified: Secondary | ICD-10-CM | POA: Insufficient documentation

## 2016-08-18 DIAGNOSIS — E1136 Type 2 diabetes mellitus with diabetic cataract: Secondary | ICD-10-CM | POA: Diagnosis not present

## 2016-08-18 DIAGNOSIS — G4730 Sleep apnea, unspecified: Secondary | ICD-10-CM | POA: Insufficient documentation

## 2016-08-18 DIAGNOSIS — I10 Essential (primary) hypertension: Secondary | ICD-10-CM | POA: Insufficient documentation

## 2016-08-18 DIAGNOSIS — M1990 Unspecified osteoarthritis, unspecified site: Secondary | ICD-10-CM | POA: Diagnosis not present

## 2016-08-18 DIAGNOSIS — Z79899 Other long term (current) drug therapy: Secondary | ICD-10-CM | POA: Insufficient documentation

## 2016-08-18 HISTORY — DX: Hypothyroidism, unspecified: E03.9

## 2016-08-18 HISTORY — DX: Cardiac arrhythmia, unspecified: I49.9

## 2016-08-18 HISTORY — PX: CATARACT EXTRACTION W/PHACO: SHX586

## 2016-08-18 LAB — GLUCOSE, CAPILLARY: GLUCOSE-CAPILLARY: 128 mg/dL — AB (ref 65–99)

## 2016-08-18 SURGERY — PHACOEMULSIFICATION, CATARACT, WITH IOL INSERTION
Anesthesia: Monitor Anesthesia Care | Site: Eye | Laterality: Left | Wound class: Clean

## 2016-08-18 MED ORDER — MIDAZOLAM HCL 2 MG/2ML IJ SOLN
INTRAMUSCULAR | Status: AC
  Filled 2016-08-18: qty 2

## 2016-08-18 MED ORDER — ARMC OPHTHALMIC DILATING DROPS
1.0000 "application " | OPHTHALMIC | Status: AC | PRN
  Administered 2016-08-18 (×3): 1 via OPHTHALMIC

## 2016-08-18 MED ORDER — SODIUM CHLORIDE 0.9 % IV SOLN
INTRAVENOUS | Status: DC
Start: 2016-08-18 — End: 2016-08-18
  Administered 2016-08-18: 08:00:00 via INTRAVENOUS

## 2016-08-18 MED ORDER — ARMC OPHTHALMIC DILATING DROPS
OPHTHALMIC | Status: AC
  Administered 2016-08-18: 1 via OPHTHALMIC
  Filled 2016-08-18: qty 0.4

## 2016-08-18 MED ORDER — LIDOCAINE HCL (PF) 4 % IJ SOLN
INTRAOCULAR | Status: DC | PRN
  Administered 2016-08-18: 4 mL via OPHTHALMIC

## 2016-08-18 MED ORDER — CARBACHOL 0.01 % IO SOLN
INTRAOCULAR | Status: DC | PRN
  Administered 2016-08-18: 0.5 mL via INTRAOCULAR

## 2016-08-18 MED ORDER — MOXIFLOXACIN HCL 0.5 % OP SOLN
OPHTHALMIC | Status: AC
  Administered 2016-08-18: 1 [drp] via OPHTHALMIC
  Filled 2016-08-18: qty 3

## 2016-08-18 MED ORDER — FENTANYL CITRATE (PF) 100 MCG/2ML IJ SOLN
INTRAMUSCULAR | Status: DC | PRN
  Administered 2016-08-18: 25 ug via INTRAVENOUS

## 2016-08-18 MED ORDER — NEOMYCIN-POLYMYXIN-DEXAMETH 0.1 % OP OINT
TOPICAL_OINTMENT | OPHTHALMIC | Status: DC | PRN
  Administered 2016-08-18: 1 via OPHTHALMIC

## 2016-08-18 MED ORDER — MIDAZOLAM HCL 2 MG/2ML IJ SOLN
INTRAMUSCULAR | Status: DC | PRN
  Administered 2016-08-18: 1 mg via INTRAVENOUS

## 2016-08-18 MED ORDER — MOXIFLOXACIN HCL 0.5 % OP SOLN
1.0000 [drp] | OPHTHALMIC | Status: AC | PRN
  Administered 2016-08-18 (×3): 1 [drp] via OPHTHALMIC

## 2016-08-18 MED ORDER — NA HYALUR & NA CHOND-NA HYALUR 0.4-0.35 ML IO KIT
PACK | INTRAOCULAR | Status: DC | PRN
  Administered 2016-08-18: .35 mL via INTRAOCULAR

## 2016-08-18 MED ORDER — FENTANYL CITRATE (PF) 100 MCG/2ML IJ SOLN
INTRAMUSCULAR | Status: AC
  Filled 2016-08-18: qty 2

## 2016-08-18 MED ORDER — EPINEPHRINE PF 1 MG/ML IJ SOLN
INTRAOCULAR | Status: DC | PRN
  Administered 2016-08-18: 200 mL via OPHTHALMIC

## 2016-08-18 MED ORDER — POVIDONE-IODINE 5 % OP SOLN
OPHTHALMIC | Status: DC | PRN
  Administered 2016-08-18: 1 via OPHTHALMIC

## 2016-08-18 SURGICAL SUPPLY — 21 items
CANNULA ANT/CHMB 27GA (MISCELLANEOUS) ×3 IMPLANT
CUP MEDICINE 2OZ PLAST GRAD ST (MISCELLANEOUS) ×3 IMPLANT
GLOVE BIO SURGEON STRL SZ8 (GLOVE) ×3 IMPLANT
GLOVE BIOGEL M 6.5 STRL (GLOVE) ×3 IMPLANT
GLOVE SURG LX 7.5 STRW (GLOVE) ×2
GLOVE SURG LX STRL 7.5 STRW (GLOVE) ×1 IMPLANT
GOWN STRL REUS W/ TWL LRG LVL3 (GOWN DISPOSABLE) ×2 IMPLANT
GOWN STRL REUS W/TWL LRG LVL3 (GOWN DISPOSABLE) ×4
LENS IOL TECNIS ITEC 23.0 (Intraocular Lens) ×3 IMPLANT
PACK CATARACT (MISCELLANEOUS) ×3 IMPLANT
PACK CATARACT BRASINGTON LX (MISCELLANEOUS) ×3 IMPLANT
PACK EYE AFTER SURG (MISCELLANEOUS) ×3 IMPLANT
SOL BSS BAG (MISCELLANEOUS) ×3
SOL PREP PVP 2OZ (MISCELLANEOUS) ×3
SOLUTION BSS BAG (MISCELLANEOUS) ×1 IMPLANT
SOLUTION PREP PVP 2OZ (MISCELLANEOUS) ×1 IMPLANT
SYR 3ML LL SCALE MARK (SYRINGE) ×3 IMPLANT
SYR 5ML LL (SYRINGE) ×3 IMPLANT
SYR TB 1ML 27GX1/2 LL (SYRINGE) ×3 IMPLANT
WATER STERILE IRR 250ML POUR (IV SOLUTION) ×3 IMPLANT
WIPE NON LINTING 3.25X3.25 (MISCELLANEOUS) ×3 IMPLANT

## 2016-08-18 NOTE — Transfer of Care (Signed)
Immediate Anesthesia Transfer of Care Note  Patient: Cheryl Rios  Procedure(s) Performed: Procedure(s) with comments: CATARACT EXTRACTION PHACO AND INTRAOCULAR LENS PLACEMENT (IOC) (Left) - US1:03.1 AP%20.6 CDE13.2 Fluid pack lot # [id number]  Patient Location: PACU  Anesthesia Type:MAC  Level of Consciousness: awake, alert  and oriented  Airway & Oxygen Therapy: Patient Spontanous Breathing  Post-op Assessment: Report given to RN and Post -op Vital signs reviewed and stable  Post vital signs: Reviewed and stable  Last Vitals:  Vitals:   08/18/16 0739 08/18/16 0915  BP: (!) 121/53 122/60  Pulse: 69 63  Resp: 16 12  Temp: (!) 35.4 C 36.1 C    Last Pain:  Vitals:   08/18/16 0915  TempSrc: Temporal  PainSc:          Complications: No apparent anesthesia complications

## 2016-08-18 NOTE — Anesthesia Post-op Follow-up Note (Cosign Needed)
Anesthesia QCDR form completed.        

## 2016-08-18 NOTE — Anesthesia Procedure Notes (Signed)
Procedure Name: MAC Date/Time: 08/18/2016 8:45 AM Performed by: [name] Pre-anesthesia Checklist: Patient identified, Emergency Drugs available, Patient being monitored and Suction available Oxygen Delivery Method: Nasal cannula

## 2016-08-18 NOTE — Discharge Instructions (Signed)
Eye Surgery Discharge Instructions  Expect mild scratchy sensation or mild soreness. DO NOT RUB YOUR EYE!  The day of surgery:  Minimal physical activity, but bed rest is not required  No reading, computer work, or close hand work  No bending, lifting, or straining.  May watch TV  For 24 hours:  No driving, legal decisions, or alcoholic beverages  Safety precautions  Eat anything you prefer: It is better to start with liquids, then soup then solid foods.  _____ Eye patch should be worn until postoperative exam tomorrow.  ____ Solar shield eyeglasses should be worn for comfort in the sunlight/patch while sleeping  Resume all regular medications including aspirin or Coumadin if these were discontinued prior to surgery. You may shower, bathe, shave, or wash your hair. Tylenol may be taken for mild discomfort.  Call your doctor if you experience significant pain, nausea, or vomiting, fever > 101 or other signs of infection. [phone] or [phone] Specific instructions:  Follow-up Information    BRASINGTON,CHADWICK, MD Follow up on 08/18/2016.   Specialty:  Ophthalmology Why:  3:00 Contact information: [address]   [place] [address] [phone]

## 2016-08-18 NOTE — H&P (Signed)
The History and Physical notes are on paper, have been signed, and are to be scanned. The patient remains stable and unchanged from the H&P.   Previous H&P reviewed, patient examined, and there are no changes.  , 08/18/2016 8:03 AM

## 2016-08-18 NOTE — Op Note (Signed)
OPERATIVE NOTE  Cheryl Rios [id number] 08/18/2016   PREOPERATIVE DIAGNOSIS:  Nuclear sclerotic cataract left eye. H25.12   POSTOPERATIVE DIAGNOSIS:    Nuclear sclerotic cataract left eye.     PROCEDURE:  Phacoemusification with posterior chamber intraocular lens placement of the left eye   LENS:   Implant Name Type Inv. Item Serial No. Manufacturer Lot No. LRB No. Used  LENS IOL DIOP 23.0 - [id number] Intraocular Lens LENS IOL DIOP 23.0 [id number] AMO   Left 1   ULTRASOUND TIME: 21 % of 1 minutes,  3 seconds.  CDE 13.2   SURGEON:  Deirdre Evenerhadwick R. , MD   ANESTHESIA:  Topical with tetracaine drops and 2% Xylocaine jelly, augmented with 1% preservative-free intracameral lidocaine.    COMPLICATIONS:  None.   DESCRIPTION OF PROCEDURE:  The patient was identified in the holding room and transported to the operating room and placed in the supine position under the operating microscope.  The left eye was identified as the operative eye and it was prepped and draped in the usual sterile ophthalmic fashion.   A 1 millimeter clear-corneal paracentesis was made at the 1:30 position. 0.5 ml of preservative-free 1% lidocaine was injected into the anterior chamber.  The anterior chamber was filled with Viscoat viscoelastic.  A 2.4 millimeter keratome was used to make a near-clear corneal incision at the 10:30 position.  .  A curvilinear capsulorrhexis was made with a cystotome and capsulorrhexis forceps.  Balanced salt solution was used to hydrodissect and hydrodelineate the nucleus.   Phacoemulsification was then used in stop and chop fashion to remove the lens nucleus and epinucleus.  The remaining cortex was then removed using the irrigation and aspiration handpiece. Provisc was then placed into the capsular bag to distend it for lens placement.  A lens was then injected into the capsular bag.  The remaining viscoelastic was aspirated.   Wounds were hydrated with balanced salt  solution.  The anterior chamber was inflated to a physiologic pressure with balanced salt solution. Vigamox 0.2 ml of a 1mg  per ml solution was injected into the anterior chamber for a dose of 0.2 mg of intracameral antibiotic at the completion of the case.  Miostat was placed into the anterior chamber to constrict the pupil.  No wound leaks were noted.  Topical Vigamox drops and Maxitrol ointment were applied to the eye.  The patient was taken to the recovery room in stable condition without complications of anesthesia or surgery  , 08/18/2016, 9:11 AM

## 2016-08-18 NOTE — Anesthesia Postprocedure Evaluation (Signed)
Anesthesia Post Note  Patient: Cheryl Rios  Procedure(s) Performed: Procedure(s) (LRB): CATARACT EXTRACTION PHACO AND INTRAOCULAR LENS PLACEMENT (IOC) (Left)  Patient location during evaluation: PACU Anesthesia Type: MAC Level of consciousness: awake, awake and alert and oriented Pain management: pain level controlled Vital Signs Assessment: post-procedure vital signs reviewed and stable Respiratory status: spontaneous breathing Cardiovascular status: blood pressure returned to baseline Postop Assessment: no signs of nausea or vomiting Anesthetic complications: no     Last Vitals:  Vitals:   08/18/16 0739 08/18/16 0915  BP: (!) 121/53 122/60  Pulse: 69 63  Resp: 16 12  Temp: (!) 35.4 C 36.1 C    Last Pain:  Vitals:   08/18/16 0915  TempSrc: Temporal  PainSc:                  [name]

## 2016-08-18 NOTE — Anesthesia Preprocedure Evaluation (Signed)
Anesthesia Evaluation  Patient identified by MRN, date of birth, ID band Patient awake    Reviewed: Allergy & Precautions, NPO status , Patient's Chart, lab work & pertinent test results  Airway Mallampati: III       Dental   Pulmonary sleep apnea ,           Cardiovascular hypertension, Pt. on medications and Pt. on home beta blockers + dysrhythmias      Neuro/Psych    GI/Hepatic negative GI ROS, Neg liver ROS,   Endo/Other  diabetesHypothyroidism   Renal/GU negative Renal ROS     Musculoskeletal  (+) Arthritis ,   Abdominal   Peds  Hematology   Anesthesia Other Findings   Reproductive/Obstetrics                             Anesthesia Physical  Anesthesia Plan  ASA: III  Anesthesia Plan: MAC   Post-op Pain Management:    Induction:   Airway Management Planned:   Additional Equipment:   Intra-op Plan:   Post-operative Plan:   Informed Consent: I have reviewed the patients History and Physical, chart, labs and discussed the procedure including the risks, benefits and alternatives for the proposed anesthesia with the patient or authorized representative who has indicated his/her understanding and acceptance.     Plan Discussed with: Anesthesiologist, CRNA and Surgeon  Anesthesia Plan Comments:         Anesthesia Quick Evaluation

## 2016-09-19 DIAGNOSIS — Z96652 Presence of left artificial knee joint: Secondary | ICD-10-CM | POA: Insufficient documentation

## 2016-11-14 ENCOUNTER — Encounter: Payer: Self-pay | Admitting: Podiatry

## 2016-11-14 ENCOUNTER — Ambulatory Visit (INDEPENDENT_AMBULATORY_CARE_PROVIDER_SITE_OTHER): Payer: MEDICARE | Admitting: Podiatry

## 2016-11-14 DIAGNOSIS — Q828 Other specified congenital malformations of skin: Secondary | ICD-10-CM

## 2016-11-14 DIAGNOSIS — M79676 Pain in unspecified toe(s): Secondary | ICD-10-CM

## 2016-11-14 DIAGNOSIS — E1149 Type 2 diabetes mellitus with other diabetic neurological complication: Secondary | ICD-10-CM

## 2016-11-14 DIAGNOSIS — B351 Tinea unguium: Secondary | ICD-10-CM

## 2016-11-14 NOTE — Progress Notes (Signed)
Complaint:  Visit Type: Patient returns to my office for continued preventative foot care services. Complaint: Patient states" my nails have grown long and thick and become painful to walk and wear shoes" Patient has been diagnosed with DM with neuropathy.. The patient presents for preventative foot care services. No changes to ROS  Podiatric Exam: Vascular: dorsalis pedis and posterior tibial pulses are palpable bilateral. Capillary return is immediate. Temperature gradient is WNL. Skin turgor WNL  Sensorium: Normal Semmes Weinstein monofilament test. Normal tactile sensation bilaterally. Nail Exam: Pt has thick disfigured discolored nails with subungual debris noted bilateral entire nail hallux through fifth toenails Ulcer Exam: There is no evidence of ulcer or pre-ulcerative changes or infection. Orthopedic Exam: Muscle tone and strength are WNL. No limitations in general ROM. No crepitus or effusions noted. Foot type and digits show no abnormalities. Bony prominences are unremarkable. Swelling feet  B/L Skin:  Porokeratosis sub 4th bilateral..  Diffuse plantar tyloma.. No infection or ulcers  Diagnosis:  Onychomycosis, , Pain in right toe, pain in left toes. Porokeratosis right foot  Treatment & Plan Procedures and Treatment: Consent by patient was obtained for treatment procedures. The patient understood the discussion of treatment and procedures well. All questions were answered thoroughly reviewed. Debridement of mycotic and hypertrophic toenails, 1 through 5 bilateral and clearing of subungual debris. No ulceration, no infection noted. Debride porokeratosis.  Return Visit-Office Procedure: Patient instructed to return to the office for a follow up visit 3 months for continued evaluation and treatment.    Helane GuntherGregory  DPM

## 2016-12-15 ENCOUNTER — Telehealth: Payer: Self-pay | Admitting: Podiatry

## 2016-12-15 NOTE — Telephone Encounter (Signed)
This is Baxter HireKristen a Air traffic controllerpharmacist calling from [facility] Calling where we are trying to get refills on pt's gabapentin. We wanted to make sure we have the correct fax number because we have been trying to get the refill for a couple of days now. Our call back number is [phone].

## 2016-12-21 ENCOUNTER — Telehealth: Payer: Self-pay

## 2016-12-21 ENCOUNTER — Telehealth: Payer: Self-pay | Admitting: Podiatry

## 2016-12-21 MED ORDER — GABAPENTIN 300 MG PO CAPS: ORAL_CAPSULE | ORAL | 3 refills | Status: DC

## 2016-12-21 NOTE — Telephone Encounter (Signed)
Patient has requested refill for Gabapentin.  Per Dr. Al CorpusHyatt, ok to refill for 1 year.   Rx has been sent to pharmacy

## 2016-12-21 NOTE — Telephone Encounter (Signed)
I'm calling about getting my medication refilled. My pharmacy stated they have contacted your office 3 times with no response about the refill of my gabapentin. I run out of the medication tomorrow. [facility] [address] I appreciate it. Bye.

## 2016-12-21 NOTE — Telephone Encounter (Signed)
Patient informed that rx has been sent to pharmacy

## 2017-02-04 ENCOUNTER — Inpatient Hospital Stay
Admission: EM | Admit: 2017-02-04 | Discharge: 2017-02-08 | DRG: 872 | Disposition: A | Discharge status: Home Health | Payer: MEDICARE | Attending: Internal Medicine | Admitting: Internal Medicine

## 2017-02-04 ENCOUNTER — Emergency Department: Payer: MEDICARE

## 2017-02-04 DIAGNOSIS — Z23 Encounter for immunization: Secondary | ICD-10-CM

## 2017-02-04 DIAGNOSIS — R05 Cough: Secondary | ICD-10-CM

## 2017-02-04 DIAGNOSIS — Z96653 Presence of artificial knee joint, bilateral: Secondary | ICD-10-CM | POA: Diagnosis present

## 2017-02-04 DIAGNOSIS — A419 Sepsis, unspecified organism: Secondary | ICD-10-CM | POA: Diagnosis present

## 2017-02-04 DIAGNOSIS — R945 Abnormal results of liver function studies: Secondary | ICD-10-CM

## 2017-02-04 DIAGNOSIS — G4733 Obstructive sleep apnea (adult) (pediatric): Secondary | ICD-10-CM | POA: Diagnosis present

## 2017-02-04 DIAGNOSIS — Z9842 Cataract extraction status, left eye: Secondary | ICD-10-CM

## 2017-02-04 DIAGNOSIS — K8030 Calculus of bile duct with cholangitis, unspecified, without obstruction: Secondary | ICD-10-CM | POA: Diagnosis present

## 2017-02-04 DIAGNOSIS — R509 Fever, unspecified: Secondary | ICD-10-CM

## 2017-02-04 DIAGNOSIS — K5790 Diverticulosis of intestine, part unspecified, without perforation or abscess without bleeding: Secondary | ICD-10-CM | POA: Diagnosis present

## 2017-02-04 DIAGNOSIS — I10 Essential (primary) hypertension: Secondary | ICD-10-CM | POA: Diagnosis present

## 2017-02-04 DIAGNOSIS — R5383 Other fatigue: Secondary | ICD-10-CM

## 2017-02-04 DIAGNOSIS — A4151 Sepsis due to Escherichia coli [E. coli]: Principal | ICD-10-CM | POA: Diagnosis present

## 2017-02-04 DIAGNOSIS — R7989 Other specified abnormal findings of blood chemistry: Secondary | ICD-10-CM | POA: Diagnosis present

## 2017-02-04 DIAGNOSIS — K8050 Calculus of bile duct without cholangitis or cholecystitis without obstruction: Secondary | ICD-10-CM

## 2017-02-04 DIAGNOSIS — Z9841 Cataract extraction status, right eye: Secondary | ICD-10-CM

## 2017-02-04 DIAGNOSIS — R531 Weakness: Secondary | ICD-10-CM

## 2017-02-04 DIAGNOSIS — Z7984 Long term (current) use of oral hypoglycemic drugs: Secondary | ICD-10-CM

## 2017-02-04 DIAGNOSIS — M1990 Unspecified osteoarthritis, unspecified site: Secondary | ICD-10-CM

## 2017-02-04 DIAGNOSIS — E039 Hypothyroidism, unspecified: Secondary | ICD-10-CM | POA: Diagnosis present

## 2017-02-04 DIAGNOSIS — R627 Adult failure to thrive: Secondary | ICD-10-CM

## 2017-02-04 DIAGNOSIS — Z79899 Other long term (current) drug therapy: Secondary | ICD-10-CM

## 2017-02-04 DIAGNOSIS — Z7982 Long term (current) use of aspirin: Secondary | ICD-10-CM

## 2017-02-04 DIAGNOSIS — H9190 Unspecified hearing loss, unspecified ear: Secondary | ICD-10-CM | POA: Diagnosis present

## 2017-02-04 DIAGNOSIS — M810 Age-related osteoporosis without current pathological fracture: Secondary | ICD-10-CM | POA: Diagnosis present

## 2017-02-04 DIAGNOSIS — E1140 Type 2 diabetes mellitus with diabetic neuropathy, unspecified: Secondary | ICD-10-CM | POA: Diagnosis present

## 2017-02-04 DIAGNOSIS — R059 Cough, unspecified: Secondary | ICD-10-CM

## 2017-02-04 DIAGNOSIS — Z961 Presence of intraocular lens: Secondary | ICD-10-CM | POA: Diagnosis present

## 2017-02-04 LAB — URINALYSIS, COMPLETE (UACMP) WITH MICROSCOPIC
Bacteria, UA: NONE SEEN
Bilirubin Urine: NEGATIVE
GLUCOSE, UA: NEGATIVE mg/dL
HGB URINE DIPSTICK: NEGATIVE
Ketones, ur: NEGATIVE mg/dL
Leukocytes, UA: NEGATIVE
NITRITE: NEGATIVE
PROTEIN: NEGATIVE mg/dL
SPECIFIC GRAVITY, URINE: 1.017 (ref 1.005–1.030)
pH: 5 (ref 5.0–8.0)

## 2017-02-04 LAB — CBC
HCT: 39.8 % (ref 35.0–47.0)
Hemoglobin: 13.4 g/dL (ref 12.0–16.0)
MCH: 29.8 pg (ref 26.0–34.0)
MCHC: 33.5 g/dL (ref 32.0–36.0)
MCV: 88.8 fL (ref 80.0–100.0)
Platelets: 209 10*3/uL (ref 150–440)
RBC: 4.48 MIL/uL (ref 3.80–5.20)
RDW: 14 % (ref 11.5–14.5)
WBC: 15.7 10*3/uL — ABNORMAL HIGH (ref 3.6–11.0)

## 2017-02-04 LAB — LACTIC ACID, PLASMA: LACTIC ACID, VENOUS: 2.1 mmol/L — AB (ref 0.5–1.9)

## 2017-02-04 LAB — BASIC METABOLIC PANEL
Anion gap: 9 (ref 5–15)
BUN: 25 mg/dL — AB (ref 6–20)
CO2: 26 mmol/L (ref 22–32)
CREATININE: 1.01 mg/dL — AB (ref 0.44–1.00)
Calcium: 8.8 mg/dL — ABNORMAL LOW (ref 8.9–10.3)
Chloride: 108 mmol/L (ref 101–111)
GFR calc Af Amer: 55 mL/min — ABNORMAL LOW (ref >60)
GFR, EST NON AFRICAN AMERICAN: 48 mL/min — AB (ref >60)
GLUCOSE: 183 mg/dL — AB (ref 65–99)
Potassium: 3.5 mmol/L (ref 3.5–5.1)
Sodium: 143 mmol/L (ref 135–145)

## 2017-02-04 LAB — TROPONIN I
TROPONIN I: 0.03 ng/mL — AB (ref <0.03)
Troponin I: 0.04 ng/mL (ref <0.03)

## 2017-02-04 LAB — BRAIN NATRIURETIC PEPTIDE: B Natriuretic Peptide: 452 pg/mL — ABNORMAL HIGH (ref 0.0–100.0)

## 2017-02-04 LAB — TSH: TSH: 1.489 u[IU]/mL (ref 0.350–4.500)

## 2017-02-04 MED ORDER — POTASSIUM CHLORIDE IN NACL 20-0.45 MEQ/L-% IV SOLN
INTRAVENOUS | Status: DC
  Administered 2017-02-04: 20:00:00 via INTRAVENOUS
  Filled 2017-02-04 (×4): qty 1000

## 2017-02-04 MED ORDER — VANCOMYCIN HCL IN DEXTROSE 1-5 GM/200ML-% IV SOLN
1000.0000 mg | Freq: Once | INTRAVENOUS | Status: AC
  Administered 2017-02-04: 1000 mg via INTRAVENOUS
  Filled 2017-02-04: qty 200

## 2017-02-04 MED ORDER — IPRATROPIUM-ALBUTEROL 0.5-2.5 (3) MG/3ML IN SOLN
3.0000 mL | Freq: Once | RESPIRATORY_TRACT | Status: AC
  Administered 2017-02-04: 3 mL via RESPIRATORY_TRACT
  Filled 2017-02-04: qty 3

## 2017-02-04 MED ORDER — IPRATROPIUM-ALBUTEROL 0.5-2.5 (3) MG/3ML IN SOLN
3.0000 mL | Freq: Four times a day (QID) | RESPIRATORY_TRACT | Status: DC
  Administered 2017-02-04 – 2017-02-05 (×2): 3 mL via RESPIRATORY_TRACT
  Filled 2017-02-04 (×2): qty 3

## 2017-02-04 MED ORDER — FUROSEMIDE 20 MG PO TABS
20.0000 mg | ORAL_TABLET | Freq: Every day | ORAL | Status: DC
  Administered 2017-02-05 – 2017-02-08 (×3): 20 mg via ORAL
  Filled 2017-02-04 (×3): qty 1

## 2017-02-04 MED ORDER — DOCUSATE SODIUM 100 MG PO CAPS
100.0000 mg | ORAL_CAPSULE | Freq: Two times a day (BID) | ORAL | Status: DC
  Administered 2017-02-04: 100 mg via ORAL
  Filled 2017-02-04 (×6): qty 1

## 2017-02-04 MED ORDER — ACETAMINOPHEN 650 MG RE SUPP: 650.0000 mg | Freq: Four times a day (QID) | RECTAL | Status: DC | PRN

## 2017-02-04 MED ORDER — ONDANSETRON HCL 4 MG/2ML IJ SOLN: 4.0000 mg | Freq: Four times a day (QID) | INTRAMUSCULAR | Status: DC | PRN

## 2017-02-04 MED ORDER — VANCOMYCIN HCL IN DEXTROSE 1-5 GM/200ML-% IV SOLN
1000.0000 mg | INTRAVENOUS | Status: DC
  Administered 2017-02-05: 1000 mg via INTRAVENOUS
  Filled 2017-02-04: qty 200

## 2017-02-04 MED ORDER — PANTOPRAZOLE SODIUM 40 MG PO TBEC
40.0000 mg | DELAYED_RELEASE_TABLET | Freq: Every day | ORAL | Status: DC
  Administered 2017-02-05 – 2017-02-08 (×3): 40 mg via ORAL
  Filled 2017-02-04 (×3): qty 1

## 2017-02-04 MED ORDER — BISACODYL 10 MG RE SUPP: 10.0000 mg | Freq: Every day | RECTAL | Status: DC | PRN

## 2017-02-04 MED ORDER — POTASSIUM CHLORIDE CRYS ER 10 MEQ PO TBCR
10.0000 meq | EXTENDED_RELEASE_TABLET | Freq: Every day | ORAL | Status: DC
  Administered 2017-02-05 – 2017-02-08 (×3): 10 meq via ORAL
  Filled 2017-02-04 (×6): qty 1

## 2017-02-04 MED ORDER — ACETAMINOPHEN 325 MG PO TABS: 650.0000 mg | ORAL_TABLET | Freq: Four times a day (QID) | ORAL | Status: DC | PRN

## 2017-02-04 MED ORDER — SODIUM CHLORIDE 0.9 % IV BOLUS (SEPSIS)
500.0000 mL | Freq: Once | INTRAVENOUS | Status: AC
  Administered 2017-02-04: 500 mL via INTRAVENOUS

## 2017-02-04 MED ORDER — OMEGA-3-ACID ETHYL ESTERS 1 G PO CAPS
1000.0000 mg | ORAL_CAPSULE | Freq: Every day | ORAL | Status: DC
  Administered 2017-02-05 – 2017-02-08 (×3): 1000 mg via ORAL
  Filled 2017-02-04 (×3): qty 1

## 2017-02-04 MED ORDER — LEVOTHYROXINE SODIUM 25 MCG PO TABS
75.0000 ug | ORAL_TABLET | Freq: Every day | ORAL | Status: DC
  Administered 2017-02-05 – 2017-02-08 (×4): 75 ug via ORAL
  Filled 2017-02-04 (×4): qty 1

## 2017-02-04 MED ORDER — HYDRALAZINE HCL 50 MG PO TABS
50.0000 mg | ORAL_TABLET | Freq: Every day | ORAL | Status: DC
  Administered 2017-02-05 – 2017-02-08 (×3): 50 mg via ORAL
  Filled 2017-02-04 (×3): qty 1

## 2017-02-04 MED ORDER — PIPERACILLIN-TAZOBACTAM 3.375 G IVPB 30 MIN
3.3750 g | Freq: Once | INTRAVENOUS | Status: AC
  Administered 2017-02-04: 3.375 g via INTRAVENOUS

## 2017-02-04 MED ORDER — PIPERACILLIN-TAZOBACTAM 3.375 G IVPB
3.3750 g | Freq: Three times a day (TID) | INTRAVENOUS | Status: DC
  Administered 2017-02-05 (×2): 3.375 g via INTRAVENOUS
  Filled 2017-02-04 (×2): qty 50

## 2017-02-04 MED ORDER — INFLUENZA VAC SPLIT HIGH-DOSE 0.5 ML IM SUSY
0.5000 mL | PREFILLED_SYRINGE | INTRAMUSCULAR | Status: AC
  Administered 2017-02-06: 0.5 mL via INTRAMUSCULAR
  Filled 2017-02-04 (×2): qty 0.5

## 2017-02-04 MED ORDER — ONDANSETRON HCL 4 MG PO TABS: 4.0000 mg | ORAL_TABLET | Freq: Four times a day (QID) | ORAL | Status: DC | PRN

## 2017-02-04 MED ORDER — PIPERACILLIN-TAZOBACTAM 3.375 G IVPB 30 MIN
INTRAVENOUS | Status: AC
  Filled 2017-02-04: qty 50

## 2017-02-04 MED ORDER — LISINOPRIL 20 MG PO TABS
20.0000 mg | ORAL_TABLET | Freq: Every day | ORAL | Status: DC
Start: 2017-02-05 — End: 2017-02-08
  Administered 2017-02-05 – 2017-02-08 (×3): 20 mg via ORAL
  Filled 2017-02-04 (×3): qty 1

## 2017-02-04 MED ORDER — HEPARIN SODIUM (PORCINE) 5000 UNIT/ML IJ SOLN
5000.0000 [IU] | Freq: Three times a day (TID) | INTRAMUSCULAR | Status: DC
  Administered 2017-02-04 – 2017-02-08 (×9): 5000 [IU] via SUBCUTANEOUS
  Filled 2017-02-04 (×9): qty 1

## 2017-02-04 MED ORDER — ASPIRIN EC 81 MG PO TBEC
81.0000 mg | DELAYED_RELEASE_TABLET | Freq: Every day | ORAL | Status: DC
  Administered 2017-02-05 – 2017-02-08 (×3): 81 mg via ORAL
  Filled 2017-02-04 (×3): qty 1

## 2017-02-04 MED ORDER — GABAPENTIN 300 MG PO CAPS
300.0000 mg | ORAL_CAPSULE | Freq: Three times a day (TID) | ORAL | Status: DC
  Administered 2017-02-04 – 2017-02-08 (×10): 300 mg via ORAL
  Filled 2017-02-04 (×10): qty 1

## 2017-02-04 MED ORDER — METOPROLOL SUCCINATE ER 25 MG PO TB24
25.0000 mg | ORAL_TABLET | Freq: Every day | ORAL | Status: DC
Start: 2017-02-05 — End: 2017-02-08
  Administered 2017-02-05 – 2017-02-08 (×3): 25 mg via ORAL
  Filled 2017-02-04 (×3): qty 1

## 2017-02-04 NOTE — ED Notes (Signed)
Pt unable to ambulate without 2 assist in room.  Pt unable to roll herself over to side to do rectal temp at this time.

## 2017-02-04 NOTE — ED Provider Notes (Signed)
Patient received in sign-out from Dr. Darnelle Catalan.  Workup and evaluation pending blood work CT imaging and reassessment.  Patient does have a low-grade temperature with mild tachypnea markedly leukocytosis with left shift and mild lactic acidosis. Does appear to be mildly dehydration therefore we'll give gentle bolus of IV fluids. CT head without any acute abnormality. Cannot identify any evidence of infectious source and urine or CT imaging thus far. Her abdominal exam is soft and benign. We'll give the dose of antibiotics do feel patient benefit from observation for rule out sepsis.      Willy Eddy, MD 02/04/17 [phone]

## 2017-02-04 NOTE — ED Notes (Signed)
Pt to CT at this time.

## 2017-02-04 NOTE — Progress Notes (Addendum)
Pharmacy Antibiotic Note  Cheryl Rios is a 83 y.o. female admitted on 02/04/2017 with  sepsis.  Pharmacy has been consulted for Vancomycin and Zosyn dosing. Patient received vancomycin 1g IV x 1 dose and Zosyn 3.375 IV x 1 dose in ED.   Plan: Ke: 0.036   T1/2: 19.25   Vd: 46  Start vancomycin 1g IV every 24 hours with 8 hour stack dosing. Calculated trough at Css is 18. Trough level ordered prior to 4th dose. Will monitor renal function and adjust dose as needed.   Start Zosyn 3.375 IV EI every 8 hours.   Height:  (157.5 cm) Weight: 187 lb (84.8 kg) IBW/kg (Calculated) : 50.1  Temp (24hrs), Avg:99.3 F (37.4 C), Min:98.4 F (36.9 C), Max:100.1 F (37.8 C)   Recent Labs Lab 02/04/17 1145 02/04/17 1630  WBC 15.7*  --   CREATININE 1.01*  --   LATICACIDVEN  --  2.1*    Estimated Creatinine Clearance: 38.2 mL/min (A) (by C-G formula based on SCr of 1.01 mg/dL (H)).    No Known Allergies  Antimicrobials this admission: 9/15 vancomycin>>  9/15 Zosyn >>   Dose adjustments this admission:   Microbiology results: 9/15 BCx: sent 9/15 UCx: sent  Thank you for allowing pharmacy to be a part of this patient's care.  Gardner Candle, PharmD, BCPS Clinical Pharmacist 02/04/2017 7:24 PM

## 2017-02-04 NOTE — ED Triage Notes (Signed)
Pt brought in from home by [facility].  Pt reporting feeling weaker upon awaking this AM.  Pt having foul odor to urine, but states she does not have pain with urination.  Pt lives alone and normally able to walk and get around by herself.  Pt reports feeling like she cant walk.  Pt ambulated with 2 assist to bed side commode and back to stretcher.

## 2017-02-04 NOTE — H&P (Signed)
History and Physical    Cheryl Rios DOB: August 12, 1925 DOA: 02/04/2017  Referring physician: Dr. Roxan Hockey PCP: Jerl Mina, MD  Specialists: none  Chief Complaint: weakness  HPI: Cheryl Rios is a 83 y.o. female has a past medical history significant for OA, arrhythmia, and DM now with fever and weakness. Lives alone. Unable to care for self. Having difficulty with ambulation. ER w/u for fever negative. Lactic acid mildly elevated. She is now admitted  Review of Systems: The patient denies anorexia,  weight loss,, vision loss, decreased hearing, hoarseness, chest pain, syncope, dyspnea on exertion, peripheral edema, balance deficits, hemoptysis, abdominal pain, melena, hematochezia, severe indigestion/heartburn, hematuria, incontinence, genital sores, suspicious skin lesions, transient blindness, depression, unusual weight change, abnormal bleeding, enlarged lymph nodes, angioedema, and breast masses.   Past Medical History:  Diagnosis Date  . Arrhythmia   . Arthritis   . Diabetes mellitus without complication (HCC)   . Diverticulosis   . Dysrhythmia   . Edema of both feet   . HOH (hard of hearing)   . Hypertension   . Hypothyroidism   . Neuropathy   . Osteoporosis   . Sleep apnea    OSA--Use C-PAP  . Thyroid disease    Past Surgical History:  Procedure Laterality Date  . ABDOMINAL HYSTERECTOMY    . CATARACT EXTRACTION W/PHACO Right 07/19/2016   Procedure: CATARACT EXTRACTION PHACO AND INTRAOCULAR LENS PLACEMENT (IOC);  Surgeon: Lockie Mola, MD;  Location: [facility] ORS;  Service: Ophthalmology;  Laterality: Right;  [place] 0:55.8 AP% 14.3 CDE 9.52 Fluid pack lot # [id number]  . CATARACT EXTRACTION W/PHACO Left 08/18/2016   Procedure: CATARACT EXTRACTION PHACO AND INTRAOCULAR LENS PLACEMENT (IOC);  Surgeon: Lockie Mola, MD;  Location: [facility] ORS;  Service: Ophthalmology;  Laterality: Left;  US1:03.1 AP%20.6 CDE13.2 Fluid pack lot # [id number]  .  CHOLECYSTECTOMY    . GALLBLADDER SURGERY    . HEMORRHOID SURGERY    . JOINT REPLACEMENT Bilateral    KNEE REPLACEMENT  . KNEE SURGERY Right    Social History:  reports that she has never smoked. She has never used smokeless tobacco. She reports that she does not drink alcohol or use drugs.  No Known Allergies  History reviewed. No pertinent family history.  Prior to Admission medications   Medication Sig Start Date End Date Taking? Authorizing Provider  Ascorbic Acid (VITAMIN C) 100 MG tablet Take 500 mg by mouth daily.     [provider]  aspirin 81 MG tablet Take 81 mg by mouth daily. In am.    [provider]  Cholecalciferol (VITAMIN D-3 PO) Take 1,000 mg by mouth.    [provider]  Cinnamon 500 MG capsule Take 1,000 mg by mouth 2 (two) times daily.     [provider]  erythromycin ophthalmic ointment 1 application at bedtime as needed.    [provider]  fish oil-omega-3 fatty acids 1000 MG capsule Take 1,200 mg by mouth daily.    [provider]  furosemide (LASIX) 40 MG tablet Take 40 mg by mouth daily. In am.    [provider]  gabapentin (NEURONTIN) 300 MG capsule Take one tablet by mouth in the morning and two tablets by mouth with dinner. Patient not taking: Reported on 07/12/2016 09/23/13   Hyatt, Max T, DPM  gabapentin (NEURONTIN) 300 MG capsule TAKE 2 CAPSULES BY MOUTH EVERY MORNING TAKE ONE CAPSULE AT LUNCH AND TAKE 2 CAPSULES AT BEDTIME 12/21/16   Hyatt, Max T,  DPM  hydrALAZINE (APRESOLINE) 50 MG tablet Take 50 mg by mouth daily. In am.    [provider]  ketoconazole (NIZORAL) 2 % cream Apply 1 application topically daily. 07/17/14   Vivi Barrack, DPM  levothyroxine (SYNTHROID, LEVOTHROID) 75 MCG tablet Take 75 mcg by mouth daily before breakfast.    [provider]  lisinopril (PRINIVIL,ZESTRIL) 20 MG tablet Take 20 mg by mouth daily. In am.    [provider]  metoprolol  succinate (TOPROL-XL) 25 MG 24 hr tablet Take 25 mg by mouth daily.  07/02/13   [provider]  metoprolol tartrate (LOPRESSOR) 25 MG tablet Take 25 mg by mouth daily. In am.    [provider]  Multiple Vitamins-Minerals (WOMENS 50+ MULTI VITAMIN/MIN PO) Take 1 tablet by mouth daily.     [provider]  potassium chloride (KLOR-CON 10) 10 MEQ tablet Take 10 mEq by mouth daily. In am.    [provider]  vitamin E (VITAMIN E) 400 UNIT capsule Take 400 Units by mouth daily.    [provider]   Physical Exam: Vitals:   02/04/17 1430 02/04/17 1500 02/04/17 1530 02/04/17 1629  BP: (!) 132/56 (!) 114/53 (!) 113/52   Pulse: 87 83 81   Resp: (!) 24 (!) 23 (!) 21   Temp:    100.1 F (37.8 C)  TempSrc:    Rectal  SpO2: 95% 94% 94%   Weight:      Height:         General:  No apparent distress, WDWN, [place]/AT  Eyes: PERRL, EOMI, no scleral icterus, conjunctiva clear  ENT: moist oropharynx without exudate, TM's benign, dentition good  Neck: supple, no lymphadenopathy. No bruits or thyromegaly  Cardiovascular: regular rate without MRG; 2+ peripheral pulses, no JVD, 1+ peripheral edema  Respiratory: CTA biL, good air movement without wheezing, rhonchi or crackled. Respiratory effort normal  Abdomen: soft, non tender to palpation, positive bowel sounds, no guarding, no rebound  Skin: no rashes or lesions  Musculoskeletal: normal bulk and tone, no joint swelling  Psychiatric: normal mood and affect, A&)X3  Neurologic: CN 2-12 grossly intact, Motor strength 5/5 in all 4 groups with symmetric DTR's and non-focal sensory exam  Labs on Admission:  Basic Metabolic Panel:  Recent Labs Lab 02/04/17 1145  NA 143  K 3.5  CL 108  CO2 26  GLUCOSE 183*  BUN 25*  CREATININE 1.01*  CALCIUM 8.8*   Liver Function Tests: No results for input(s): AST, ALT, ALKPHOS, BILITOT, PROT, ALBUMIN in the last 168 hours. No results for input(s): LIPASE,  AMYLASE in the last 168 hours. No results for input(s): AMMONIA in the last 168 hours. CBC:  Recent Labs Lab 02/04/17 1145  WBC 15.7*  HGB 13.4  HCT 39.8  MCV 88.8  PLT 209   Cardiac Enzymes:  Recent Labs Lab 02/04/17 1145 02/04/17 1348  TROPONINI 0.04* 0.03*    BNP (last 3 results)  Recent Labs  02/04/17 1145  BNP 452.0*    ProBNP (last 3 results) No results for input(s): PROBNP in the last 8760 hours.  CBG: No results for input(s): GLUCAP in the last 168 hours.  Radiological Exams on Admission: Ct Head Wo Contrast  Result Date: 02/04/2017 CLINICAL DATA:  Generalized weakness.  Unable to walk. EXAM: CT HEAD WITHOUT CONTRAST TECHNIQUE: Contiguous axial images were obtained from the base of the skull through the vertex without intravenous contrast. COMPARISON:  None. FINDINGS: Brain: No evidence of acute  infarction, hemorrhage, hydrocephalus, extra-axial collection or mass lesion/mass effect. Age congruent cerebral volume loss. Age normal white matter appearance. Vascular: No hyperdense vessel or unexpected calcification. Skull: Normal. Negative for fracture or focal lesion. Sinuses/Orbits: No acute finding.  Bilateral cataract resection. IMPRESSION: Age normal head CT. Electronically Signed   By: Marnee Spring M.D.   On: 02/04/2017 17:22   Ct Chest Wo Contrast  Result Date: 02/04/2017 CLINICAL DATA:  Dyspnea.  Chronic.  Indeterminate chest radiograph. EXAM: CT CHEST WITHOUT CONTRAST TECHNIQUE: Multidetector CT imaging of the chest was performed following the standard protocol without IV contrast. COMPARISON:  Chest radiograph earlier today.  No prior CT. FINDINGS: Cardiovascular: Aortic and branch vessel atherosclerosis. Tortuous thoracic aorta. Moderate cardiomegaly, without pericardial effusion. Multivessel coronary artery atherosclerosis. Mediastinum/Nodes: No mediastinal or definite hilar adenopathy, given limitations of unenhanced CT. Lungs/Pleura: No pleural fluid.  Right upper lobe 4 mm pulmonary nodule on image 33/ series 3. More inferior right upper lobe pulmonary nodule of 4 mm on image 43/ series 3. Right middle lobe scarring or subsegmental atelectasis 3 mm right lower lobe nodule on image 108/series 3. A subpleural right lower lobe 3 mm nodule on image 93/ series 3. Scattered left upper lobe pulmonary nodules of 4 mm or less. A 5 mm left lower lobe pulmonary nodule on image 104/ series 3. Calcified granuloma in the left lower lobe. Subpleural left lower lobe 5 mm nodule on image 34/series 3. Mild subsegmental atelectasis or scar at the left lung base. No evidence of pneumonia. Upper Abdomen: Mild hepatic steatosis. Variant lateral segment left liver lobe extending in the left upper quadrant. Normal imaged portions of the spleen, stomach, pancreas, kidneys. Mild adrenal thickening bilaterally. Mild porta hepatis adenopathy including at 1.7 cm on image 142/series 2. Musculoskeletal: No acute osseous abnormality. IMPRESSION: 1. No evidence of pneumonia. 2. Scattered pulmonary nodules of maximally 5 mm. No follow-up needed if patient is low-risk. Non-contrast chest CT can be considered in 12 months if patient is high-risk. This recommendation follows the consensus statement: Guidelines for Management of Incidental Pulmonary Nodules Detected on CT Images: From the Fleischner Society 2017; Radiology 2017; 284:228-243. 3. Coronary artery atherosclerosis. Aortic Atherosclerosis (ICD10-I70.0). 4. Hepatic steatosis. Mild porta hepatis adenopathy could be reactive and related to steatosis. Correlate with abdominal complaints. Electronically Signed   By: Jeronimo Greaves M.D.   On: 02/04/2017 16:08   Dg Chest Portable 1 View  Result Date: 02/04/2017 CLINICAL DATA:  83 year old female with weakness EXAM: PORTABLE CHEST 1 VIEW COMPARISON:  None. FINDINGS: Cardiomegaly identified. Bibasilar opacities are noted -question atelectasis, scarring or possibly airspace disease. No evidence of  pleural effusion or pneumothorax. No acute bony abnormalities are present. IMPRESSION: Bibasilar opacities which may represent atelectasis, scarring or airspace disease. Cardiomegaly. Electronically Signed   By: Harmon Pier M.D.   On: 02/04/2017 12:20    EKG: Independently reviewed.  Assessment/Plan Principal Problem:   Fever Active Problems:   Weakness   Failure to thrive in adult   OA (osteoarthritis)   Cultures sent. Will observe on floor with IV ABX and IV fluids. Consult PT and CSW. Repeat labs in AM.  Diet: carb modified Fluids: 1/2 NS with K+@75  DVT Prophylaxis: SQ Heparin  Code Status: FULL  Family Communication: yes  Disposition Plan: ALF  Time spent: 50 min

## 2017-02-04 NOTE — ED Provider Notes (Signed)
[facility] Emergency Department Provider Note   ____________________________________________   First MD Initiated Contact with Patient 02/04/17 1143     (approximate)  I have reviewed the triage vital signs and the nursing notes.   HISTORY  Chief Complaint Fatigue  HPI Cheryl Rios is a 83 y.o. female patient reports she got up this morning and felt very weak and felt like she couldn't stand up. Normally she lives by herself and can walk around without any trouble but this morning she felt too weak to walk she doesn't have any focal weakness is just weak all over. She said she was achy all over as well. She does not believe she is running fever has no cough or vomiting.   Past Medical History:  Diagnosis Date  . Arrhythmia   . Arthritis   . Diabetes mellitus without complication (HCC)   . Diverticulosis   . Dysrhythmia   . Edema of both feet   . HOH (hard of hearing)   . Hypertension   . Hypothyroidism   . Neuropathy   . Osteoporosis   . Sleep apnea    OSA--Use C-PAP  . Thyroid disease     There are no active problems to display for this patient.   Past Surgical History:  Procedure Laterality Date  . ABDOMINAL HYSTERECTOMY    . CATARACT EXTRACTION W/PHACO Right 07/19/2016   Procedure: CATARACT EXTRACTION PHACO AND INTRAOCULAR LENS PLACEMENT (IOC);  Surgeon: Lockie Mola, MD;  Location: [facility] ORS;  Service: Ophthalmology;  Laterality: Right;  [place] 0:55.8 AP% 14.3 CDE 9.52 Fluid pack lot # [id number]  . CATARACT EXTRACTION W/PHACO Left 08/18/2016   Procedure: CATARACT EXTRACTION PHACO AND INTRAOCULAR LENS PLACEMENT (IOC);  Surgeon: Lockie Mola, MD;  Location: [facility] ORS;  Service: Ophthalmology;  Laterality: Left;  US1:03.1 AP%20.6 CDE13.2 Fluid pack lot # [id number]  . CHOLECYSTECTOMY    . GALLBLADDER SURGERY    . HEMORRHOID SURGERY    . JOINT REPLACEMENT Bilateral    KNEE REPLACEMENT  . KNEE SURGERY Right     Prior to  Admission medications   Medication Sig Start Date End Date Taking? Authorizing Provider  Ascorbic Acid (VITAMIN C) 100 MG tablet Take 500 mg by mouth daily.     [provider]  aspirin 81 MG tablet Take 81 mg by mouth daily. In am.    [provider]  Cholecalciferol (VITAMIN D-3 PO) Take 1,000 mg by mouth.    [provider]  Cinnamon 500 MG capsule Take 1,000 mg by mouth 2 (two) times daily.     [provider]  erythromycin ophthalmic ointment 1 application at bedtime as needed.    [provider]  fish oil-omega-3 fatty acids 1000 MG capsule Take 1,200 mg by mouth daily.    [provider]  furosemide (LASIX) 40 MG tablet Take 40 mg by mouth daily. In am.    [provider]  gabapentin (NEURONTIN) 300 MG capsule Take one tablet by mouth in the morning and two tablets by mouth with dinner. Patient not taking: Reported on 07/12/2016 09/23/13   Hyatt, Max T, DPM  gabapentin (NEURONTIN) 300 MG capsule TAKE 2 CAPSULES BY MOUTH EVERY MORNING TAKE ONE CAPSULE AT LUNCH AND TAKE 2 CAPSULES AT BEDTIME 12/21/16   Hyatt, Max T, DPM  hydrALAZINE (APRESOLINE) 50 MG tablet Take 50 mg by mouth daily. In am.    [provider]  ketoconazole (NIZORAL) 2 % cream Apply 1 application topically daily.  07/17/14   Vivi Barrack, DPM  levothyroxine (SYNTHROID, LEVOTHROID) 75 MCG tablet Take 75 mcg by mouth daily before breakfast.    [provider]  lisinopril (PRINIVIL,ZESTRIL) 20 MG tablet Take 20 mg by mouth daily. In am.    [provider]  metoprolol succinate (TOPROL-XL) 25 MG 24 hr tablet Take 25 mg by mouth daily.  07/02/13   [provider]  metoprolol tartrate (LOPRESSOR) 25 MG tablet Take 25 mg by mouth daily. In am.    [provider]  Multiple Vitamins-Minerals (WOMENS 50+ MULTI VITAMIN/MIN PO) Take 1 tablet by mouth daily.     [provider]  potassium chloride (KLOR-CON 10) 10 MEQ tablet  Take 10 mEq by mouth daily. In am.    [provider]  vitamin E (VITAMIN E) 400 UNIT capsule Take 400 Units by mouth daily.    [provider]    Allergies Patient has no known allergies.  No family history on file.  Social History Social History  Substance Use Topics  . Smoking status: Never Smoker  . Smokeless tobacco: Never Used  . Alcohol use No    Review of Systems  Constitutional: No fever/chills Eyes: No visual changes. ENT: No sore throat. Cardiovascular: Denies chest pain. Respiratory: Denies shortness of breath. Gastrointestinal: No abdominal pain.  No nausea, no vomiting.  No diarrhea.  No constipation. Genitourinary: Negative for dysuria. Musculoskeletal: Negative for back pain. Skin: Negative for rash. Neurological: Negative for headaches, focal weakness   ____________________________________________   PHYSICAL EXAM:  VITAL SIGNS: ED Triage Vitals  Enc Vitals Group     BP 02/04/17 1148 (!) 115/51     Pulse Rate 02/04/17 1146 92     Resp 02/04/17 1146 (!) 21     Temp 02/04/17 1146 98.4 F (36.9 C)     Temp Source 02/04/17 1146 Oral     SpO2 02/04/17 1146 94 %     Weight 02/04/17 1147 187 lb (84.8 kg)     Height 02/04/17 1147  (1.575 m)     Head Circumference --      Peak Flow --      Pain Score --      Pain Loc --      Pain Edu? --      Excl. in GC? --     Constitutional: Alert and oriented. Well appearing and in no acute distress. Eyes: Conjunctivae are normal. PERRL. EOMI. Head: Atraumatic. Nose: No congestion/rhinnorhea. Mouth/Throat: Mucous membranes are moist.  Oropharynx non-erythematous. Neck: No stridor.  Cardiovascular: Normal rate, regular rhythm. Grossly normal heart sounds.  Good peripheral circulation. Respiratory: Normal respiratory effort.  No retractions. Lungs CTAB. Gastrointestinal: Soft and nontender. No distention. No abdominal bruits. No CVA tenderness. Musculoskeletal: No lower extremity  tenderness nor edema.  No joint effusions. Neurologic:  Normal speech and language. No gross focal neurologic deficits are appreciated. Cranial nerves II through XII are intact other visual fields were not checked cerebellar finger-nose was normal motor strength is 5 over 5 throughout sensation is intact Skin:  Skin is warm, dry and intact. No rash noted. Psychiatric: Mood and affect are normal. Speech and behavior are normal.  ____________________________________________   LABS (all labs ordered are listed, but only abnormal results are displayed)  Labs Reviewed  BASIC METABOLIC PANEL - Abnormal; Notable for the following:       Result Value   Glucose, Bld 183 (*)    BUN 25 (*)    Creatinine, Ser  1.01 (*)    Calcium 8.8 (*)    GFR calc non Af Amer 48 (*)    GFR calc Af Amer 55 (*)    All other components within normal limits  CBC - Abnormal; Notable for the following:    WBC 15.7 (*)    All other components within normal limits  URINALYSIS, COMPLETE (UACMP) WITH MICROSCOPIC - Abnormal; Notable for the following:    Color, Urine AMBER (*)    APPearance CLEAR (*)    Squamous Epithelial / LPF 0-5 (*)    All other components within normal limits  TROPONIN I - Abnormal; Notable for the following:    Troponin I 0.04 (*)    All other components within normal limits  BRAIN NATRIURETIC PEPTIDE - Abnormal; Notable for the following:    B Natriuretic Peptide 452.0 (*)    All other components within normal limits  TROPONIN I - Abnormal; Notable for the following:    Troponin I 0.03 (*)    All other components within normal limits  CBG MONITORING, ED   ____________________________________________  EKG  EKG read and interpreted by me shows normal sinus rhythm rate of 91 normal axis nonspecific ST-T wave changes. Computer is reading right axis and do not agree ____________________________________________  RADIOLOGY  Dg Chest Portable 1 View  Result Date: 02/04/2017 CLINICAL DATA:   83 year old female with weakness EXAM: PORTABLE CHEST 1 VIEW COMPARISON:  None. FINDINGS: Cardiomegaly identified. Bibasilar opacities are noted -question atelectasis, scarring or possibly airspace disease. No evidence of pleural effusion or pneumothorax. No acute bony abnormalities are present. IMPRESSION: Bibasilar opacities which may represent atelectasis, scarring or airspace disease. Cardiomegaly. Electronically Signed   By: Harmon Pier M.D.   On: 02/04/2017 12:20    ____________________________________________   PROCEDURES  Procedure(s) performed:  Procedures  Critical Care performed:   ____________________________________________   INITIAL IMPRESSION / ASSESSMENT AND PLAN / ED COURSE  Pertinent labs & imaging results that were available during my care of the patient were reviewed by me and considered in my medical decision making (see chart for details).   Signed out to Dr Roxan Hockey pending CT report and result of fluids (to see if weakness improves)     ____________________________________________   FINAL CLINICAL IMPRESSION(S) / ED DIAGNOSES  Final diagnoses:  Fatigue, unspecified type      NEW MEDICATIONS STARTED DURING THIS VISIT:  New Prescriptions   No medications on file     Note:  This document was prepared using Dragon voice recognition software and may include unintentional dictation errors.    Arnaldo Natal, MD 02/04/17 [phone]

## 2017-02-05 ENCOUNTER — Observation Stay: Payer: MEDICARE

## 2017-02-05 DIAGNOSIS — A419 Sepsis, unspecified organism: Secondary | ICD-10-CM | POA: Diagnosis present

## 2017-02-05 DIAGNOSIS — M1990 Unspecified osteoarthritis, unspecified site: Secondary | ICD-10-CM | POA: Diagnosis present

## 2017-02-05 DIAGNOSIS — Z961 Presence of intraocular lens: Secondary | ICD-10-CM | POA: Diagnosis present

## 2017-02-05 DIAGNOSIS — Z7984 Long term (current) use of oral hypoglycemic drugs: Secondary | ICD-10-CM | POA: Diagnosis not present

## 2017-02-05 DIAGNOSIS — R5383 Other fatigue: Secondary | ICD-10-CM | POA: Diagnosis present

## 2017-02-05 DIAGNOSIS — Z23 Encounter for immunization: Secondary | ICD-10-CM | POA: Diagnosis present

## 2017-02-05 DIAGNOSIS — E039 Hypothyroidism, unspecified: Secondary | ICD-10-CM | POA: Diagnosis present

## 2017-02-05 DIAGNOSIS — R7989 Other specified abnormal findings of blood chemistry: Secondary | ICD-10-CM | POA: Diagnosis present

## 2017-02-05 DIAGNOSIS — K8030 Calculus of bile duct with cholangitis, unspecified, without obstruction: Secondary | ICD-10-CM | POA: Diagnosis present

## 2017-02-05 DIAGNOSIS — Z7982 Long term (current) use of aspirin: Secondary | ICD-10-CM | POA: Diagnosis not present

## 2017-02-05 DIAGNOSIS — R627 Adult failure to thrive: Secondary | ICD-10-CM | POA: Diagnosis present

## 2017-02-05 DIAGNOSIS — G4733 Obstructive sleep apnea (adult) (pediatric): Secondary | ICD-10-CM | POA: Diagnosis present

## 2017-02-05 DIAGNOSIS — Z96653 Presence of artificial knee joint, bilateral: Secondary | ICD-10-CM | POA: Diagnosis present

## 2017-02-05 DIAGNOSIS — K8050 Calculus of bile duct without cholangitis or cholecystitis without obstruction: Secondary | ICD-10-CM | POA: Diagnosis not present

## 2017-02-05 DIAGNOSIS — I10 Essential (primary) hypertension: Secondary | ICD-10-CM | POA: Diagnosis present

## 2017-02-05 DIAGNOSIS — A4151 Sepsis due to Escherichia coli [E. coli]: Secondary | ICD-10-CM | POA: Diagnosis present

## 2017-02-05 DIAGNOSIS — Z9842 Cataract extraction status, left eye: Secondary | ICD-10-CM | POA: Diagnosis not present

## 2017-02-05 DIAGNOSIS — H9190 Unspecified hearing loss, unspecified ear: Secondary | ICD-10-CM | POA: Diagnosis present

## 2017-02-05 DIAGNOSIS — Z79899 Other long term (current) drug therapy: Secondary | ICD-10-CM | POA: Diagnosis not present

## 2017-02-05 DIAGNOSIS — E1140 Type 2 diabetes mellitus with diabetic neuropathy, unspecified: Secondary | ICD-10-CM | POA: Diagnosis present

## 2017-02-05 DIAGNOSIS — M810 Age-related osteoporosis without current pathological fracture: Secondary | ICD-10-CM | POA: Diagnosis present

## 2017-02-05 DIAGNOSIS — Z9841 Cataract extraction status, right eye: Secondary | ICD-10-CM | POA: Diagnosis not present

## 2017-02-05 DIAGNOSIS — K5790 Diverticulosis of intestine, part unspecified, without perforation or abscess without bleeding: Secondary | ICD-10-CM | POA: Diagnosis present

## 2017-02-05 LAB — CBC
HEMATOCRIT: 33.1 % — AB (ref 35.0–47.0)
HEMOGLOBIN: 11.6 g/dL — AB (ref 12.0–16.0)
MCH: 30.5 pg (ref 26.0–34.0)
MCHC: 34.9 g/dL (ref 32.0–36.0)
MCV: 87.6 fL (ref 80.0–100.0)
Platelets: 178 10*3/uL (ref 150–440)
RBC: 3.78 MIL/uL — ABNORMAL LOW (ref 3.80–5.20)
RDW: 14.4 % (ref 11.5–14.5)
WBC: 9 10*3/uL (ref 3.6–11.0)

## 2017-02-05 LAB — COMPREHENSIVE METABOLIC PANEL
ALBUMIN: 2.6 g/dL — AB (ref 3.5–5.0)
ALK PHOS: 101 U/L (ref 38–126)
ALT: 137 U/L — ABNORMAL HIGH (ref 14–54)
ANION GAP: 7 (ref 5–15)
AST: 164 U/L — ABNORMAL HIGH (ref 15–41)
BUN: 23 mg/dL — ABNORMAL HIGH (ref 6–20)
CALCIUM: 8.1 mg/dL — AB (ref 8.9–10.3)
CO2: 25 mmol/L (ref 22–32)
Chloride: 110 mmol/L (ref 101–111)
Creatinine, Ser: 0.98 mg/dL (ref 0.44–1.00)
GFR calc non Af Amer: 50 mL/min — ABNORMAL LOW (ref >60)
GFR, EST AFRICAN AMERICAN: 58 mL/min — AB (ref >60)
GLUCOSE: 121 mg/dL — AB (ref 65–99)
POTASSIUM: 3.4 mmol/L — AB (ref 3.5–5.1)
SODIUM: 142 mmol/L (ref 135–145)
Total Bilirubin: 5.1 mg/dL — ABNORMAL HIGH (ref 0.3–1.2)
Total Protein: 5.8 g/dL — ABNORMAL LOW (ref 6.5–8.1)

## 2017-02-05 LAB — BLOOD CULTURE ID PANEL (REFLEXED)
ACINETOBACTER BAUMANNII: NOT DETECTED
CANDIDA GLABRATA: NOT DETECTED
CANDIDA KRUSEI: NOT DETECTED
CANDIDA PARAPSILOSIS: NOT DETECTED
Candida albicans: NOT DETECTED
Candida tropicalis: NOT DETECTED
Carbapenem resistance: NOT DETECTED
ENTEROBACTERIACEAE SPECIES: DETECTED — AB
ENTEROCOCCUS SPECIES: NOT DETECTED
ESCHERICHIA COLI: DETECTED — AB
Enterobacter cloacae complex: NOT DETECTED
Haemophilus influenzae: NOT DETECTED
KLEBSIELLA OXYTOCA: NOT DETECTED
KLEBSIELLA PNEUMONIAE: NOT DETECTED
LISTERIA MONOCYTOGENES: NOT DETECTED
Neisseria meningitidis: NOT DETECTED
PSEUDOMONAS AERUGINOSA: NOT DETECTED
Proteus species: NOT DETECTED
SERRATIA MARCESCENS: NOT DETECTED
STREPTOCOCCUS PYOGENES: NOT DETECTED
STREPTOCOCCUS SPECIES: NOT DETECTED
Staphylococcus aureus (BCID): NOT DETECTED
Staphylococcus species: NOT DETECTED
Streptococcus agalactiae: NOT DETECTED
Streptococcus pneumoniae: NOT DETECTED

## 2017-02-05 LAB — BLOOD GAS, VENOUS
PATIENT TEMPERATURE: 37
PH VEN: 7.4 (ref 7.250–7.430)
pCO2, Ven: 42 mmHg — ABNORMAL LOW (ref 44.0–60.0)

## 2017-02-05 LAB — MRSA PCR SCREENING: MRSA BY PCR: NEGATIVE

## 2017-02-05 LAB — GLUCOSE, CAPILLARY
GLUCOSE-CAPILLARY: 117 mg/dL — AB (ref 65–99)
Glucose-Capillary: 145 mg/dL — ABNORMAL HIGH (ref 65–99)

## 2017-02-05 MED ORDER — IPRATROPIUM-ALBUTEROL 0.5-2.5 (3) MG/3ML IN SOLN
3.0000 mL | Freq: Three times a day (TID) | RESPIRATORY_TRACT | Status: DC
  Administered 2017-02-05: 3 mL via RESPIRATORY_TRACT
  Filled 2017-02-05: qty 3

## 2017-02-05 MED ORDER — IPRATROPIUM-ALBUTEROL 0.5-2.5 (3) MG/3ML IN SOLN: 3.0000 mL | Freq: Four times a day (QID) | RESPIRATORY_TRACT | Status: DC | PRN

## 2017-02-05 MED ORDER — SODIUM CHLORIDE 0.9 % IV SOLN
1.0000 g | Freq: Two times a day (BID) | INTRAVENOUS | Status: DC
  Administered 2017-02-05 – 2017-02-07 (×5): 1 g via INTRAVENOUS
  Filled 2017-02-05 (×8): qty 1

## 2017-02-05 NOTE — Progress Notes (Signed)
PHARMACY - PHYSICIAN COMMUNICATION CRITICAL VALUE ALERT - BLOOD CULTURE IDENTIFICATION (BCID)  Results for orders placed or performed during the hospital encounter of 02/04/17  Blood Culture ID Panel (Reflexed) (Collected: 02/04/2017  6:37 PM)  Result Value Ref Range   Enterococcus species NOT DETECTED NOT DETECTED   Listeria monocytogenes NOT DETECTED NOT DETECTED   Staphylococcus species NOT DETECTED NOT DETECTED   Staphylococcus aureus NOT DETECTED NOT DETECTED   Streptococcus species NOT DETECTED NOT DETECTED   Streptococcus agalactiae NOT DETECTED NOT DETECTED   Streptococcus pneumoniae NOT DETECTED NOT DETECTED   Streptococcus pyogenes NOT DETECTED NOT DETECTED   Acinetobacter baumannii NOT DETECTED NOT DETECTED   Enterobacteriaceae species DETECTED (A) NOT DETECTED   Enterobacter cloacae complex NOT DETECTED NOT DETECTED   Escherichia coli DETECTED (A) NOT DETECTED   Klebsiella oxytoca NOT DETECTED NOT DETECTED   Klebsiella pneumoniae NOT DETECTED NOT DETECTED   Proteus species NOT DETECTED NOT DETECTED   Serratia marcescens NOT DETECTED NOT DETECTED   Carbapenem resistance NOT DETECTED NOT DETECTED   Haemophilus influenzae NOT DETECTED NOT DETECTED   Neisseria meningitidis NOT DETECTED NOT DETECTED   Pseudomonas aeruginosa NOT DETECTED NOT DETECTED   Candida albicans NOT DETECTED NOT DETECTED   Candida glabrata NOT DETECTED NOT DETECTED   Candida krusei NOT DETECTED NOT DETECTED   Candida parapsilosis NOT DETECTED NOT DETECTED   Candida tropicalis NOT DETECTED NOT DETECTED    Name of physician (or Provider) Contacted: Dr. Cherlynn Kaiser  Changes to prescribed antibiotics required: change zosyn to Meropenem, d/c Vancomycin.  , A 02/05/2017  11:23 AM

## 2017-02-05 NOTE — Consult Note (Signed)
[facility] Infectious Disease     Reason for Consult:E coli bacteremia,elevated LFTs    Referring Physician: Jeronimo Greaves Date of Admission:  02/04/2017   Principal Problem:   Fever Active Problems:   Weakness   Failure to thrive in adult   OA (osteoarthritis)   Sepsis ([place])   HPI: Cheryl Rios is a 83 y.o. female admitted with weakness and fevers. She states she was fine Thursday and Friday then ate pizza Friday night then woke up Sat very weak and had large BM. She denies abd pain or nausea. She had on admit temp of 100.1, elevated LFTs, WBC 15. BCX + E coli, RUQ USS with intrahepatic ductal dilation.   Per her report she had prior biliary stone several years ago removed by Dr Candace Cruise. Had a chole many years ago.  Denies dysuria.    Past Medical History:  Diagnosis Date  . Arrhythmia   . Arthritis   . Diabetes mellitus without complication ([place])   . Diverticulosis   . Dysrhythmia   . Edema of both feet   . HOH (hard of hearing)   . Hypertension   . Hypothyroidism   . Neuropathy   . Osteoporosis   . Sleep apnea    OSA--Use C-PAP  . Thyroid disease    Past Surgical History:  Procedure Laterality Date  . ABDOMINAL HYSTERECTOMY    . CATARACT EXTRACTION W/PHACO Right 07/19/2016   Procedure: CATARACT EXTRACTION PHACO AND INTRAOCULAR LENS PLACEMENT (IOC);  Surgeon: Leandrew Koyanagi, MD;  Location: [facility] ORS;  Service: Ophthalmology;  Laterality: Right;  [place] 0:55.8 AP% 14.3 CDE 9.52 Fluid pack lot # [id number]  . CATARACT EXTRACTION W/PHACO Left 08/18/2016   Procedure: CATARACT EXTRACTION PHACO AND INTRAOCULAR LENS PLACEMENT (IOC);  Surgeon: Leandrew Koyanagi, MD;  Location: [facility] ORS;  Service: Ophthalmology;  Laterality: Left;  US1:03.1 AP%20.6 CDE13.2 Fluid pack lot # [id number]  . CHOLECYSTECTOMY    . GALLBLADDER SURGERY    . HEMORRHOID SURGERY    . JOINT REPLACEMENT Bilateral    KNEE REPLACEMENT  . KNEE SURGERY Right    Social History  Substance Use Topics  .  Smoking status: Never Smoker  . Smokeless tobacco: Never Used  . Alcohol use No   History reviewed. No pertinent family history.  Allergies: No Known Allergies  Current antibiotics: Antibiotics Given (last 72 hours)    Date/Time Action Medication Dose Rate   02/04/17 1818 New Bag/Given   piperacillin-tazobactam (ZOSYN) IVPB 3.375 g 3.375 g 100 mL/hr   02/04/17 1845 New Bag/Given   vancomycin (VANCOCIN) IVPB 1000 mg/200 mL premix 1,000 mg 200 mL/hr   02/05/17 0442 New Bag/Given   piperacillin-tazobactam (ZOSYN) IVPB 3.375 g 3.375 g 12.5 mL/hr   02/05/17 0443 New Bag/Given   vancomycin (VANCOCIN) IVPB 1000 mg/200 mL premix 1,000 mg 200 mL/hr   02/05/17 0820 New Bag/Given   piperacillin-tazobactam (ZOSYN) IVPB 3.375 g 3.375 g 12.5 mL/hr   02/05/17 1330 New Bag/Given   meropenem (MERREM) 1 g in sodium chloride 0.9 % 100 mL IVPB 1 g 200 mL/hr      MEDICATIONS: . aspirin EC  81 mg Oral Daily  . docusate sodium  100 mg Oral BID  . furosemide  20 mg Oral Daily  . gabapentin  300 mg Oral TID  . heparin  5,000 Units Subcutaneous Q8H  . hydrALAZINE  50 mg Oral Daily  . Influenza vac split quadrivalent PF  0.5 mL Intramuscular Tomorrow-1000  . levothyroxine  75 mcg Oral QAC  breakfast  . lisinopril  20 mg Oral Daily  . metoprolol succinate  25 mg Oral Daily  . omega-3 acid ethyl esters  1,000 mg Oral Daily  . pantoprazole  40 mg Oral Daily  . potassium chloride  10 mEq Oral Daily    Review of Systems - 11 systems reviewed and negative per HPI   OBJECTIVE: Temp:  [98 F (36.7 C)-98.7 F (37.1 C)] 98 F (36.7 C) (09/16 2045) Pulse Rate:  [8-87] 84 (09/16 2045) Resp:  [18-20] 20 (09/16 2045) BP: (114-126)/(48-59) 114/48 (09/16 2045) SpO2:  [93 %-95 %] 95 % (09/16 2045) Weight:  [91.9 kg (202 lb 8 oz)] 91.9 kg (202 lb 8 oz) (09/16 0500) Physical Exam  Constitutional:  oriented to person, place, and time. appears well-developed and well-nourished. No distress. obese HENT:  [place]/AT, PERRLA, no scleral icterus Mouth/Throat: Oropharynx is clear and moist. No oropharyngeal exudate.  Cardiovascular: Normal rate, regular rhythm and normal heart sounds. Exam reveals no gallop and no friction rub.  No murmur heard.  Pulmonary/Chest: Effort normal and breath sounds normal. No respiratory distress.  has no wheezes.  Neck = supple, no nuchal rigidity Abdominal: Soft. Bowel sounds are normal.  exhibits no distension. There is no tenderness.  Lymphadenopathy: no cervical adenopathy. No axillary adenopathy Neurological: alert and oriented to person, place, and time.  Skin: Skin is warm and dry. No rash noted. No erythema.  Psychiatric: a normal mood and affect.  behavior is normal.    LABS: Results for orders placed or performed during the hospital encounter of 02/04/17 (from the past 48 hour(s))  Basic metabolic panel     Status: Abnormal   Collection Time: 02/04/17 11:45 AM  Result Value Ref Range   Sodium 143 135 - 145 mmol/L   Potassium 3.5 3.5 - 5.1 mmol/L   Chloride 108 101 - 111 mmol/L   CO2 26 22 - 32 mmol/L   Glucose, Bld 183 (H) 65 - 99 mg/dL   BUN 25 (H) 6 - 20 mg/dL   Creatinine, Ser 1.01 (H) 0.44 - 1.00 mg/dL   Calcium 8.8 (L) 8.9 - 10.3 mg/dL   GFR calc non Af Amer 48 (L) >60 mL/min   GFR calc Af Amer 55 (L) >60 mL/min    Comment: (NOTE) The eGFR has been calculated using the CKD EPI equation. This calculation has not been validated in all clinical situations. eGFR's persistently <60 mL/min signify possible Chronic Kidney Disease.    Anion gap 9 5 - 15  CBC     Status: Abnormal   Collection Time: 02/04/17 11:45 AM  Result Value Ref Range   WBC 15.7 (H) 3.6 - 11.0 K/uL   RBC 4.48 3.80 - 5.20 MIL/uL   Hemoglobin 13.4 12.0 - 16.0 g/dL   HCT 39.8 35.0 - 47.0 %   MCV 88.8 80.0 - 100.0 fL   MCH 29.8 26.0 - 34.0 pg   MCHC 33.5 32.0 - 36.0 g/dL   RDW 14.0 11.5 - 14.5 %   Platelets 209 150 - 440 K/uL  Urinalysis, Complete w Microscopic     Status:  Abnormal   Collection Time: 02/04/17 11:45 AM  Result Value Ref Range   Color, Urine AMBER (A) YELLOW    Comment: BIOCHEMICALS MAY BE AFFECTED BY COLOR   APPearance CLEAR (A) CLEAR   Specific Gravity, Urine 1.017 1.005 - 1.030   pH 5.0 5.0 - 8.0   Glucose, UA NEGATIVE NEGATIVE mg/dL   Hgb urine dipstick NEGATIVE NEGATIVE  Bilirubin Urine NEGATIVE NEGATIVE   Ketones, ur NEGATIVE NEGATIVE mg/dL   Protein, ur NEGATIVE NEGATIVE mg/dL   Nitrite NEGATIVE NEGATIVE   Leukocytes, UA NEGATIVE NEGATIVE   RBC / HPF 0-5 0 - 5 RBC/hpf   WBC, UA 0-5 0 - 5 WBC/hpf   Bacteria, UA NONE SEEN NONE SEEN   Squamous Epithelial / LPF 0-5 (A) NONE SEEN   Mucus PRESENT   Troponin I     Status: Abnormal   Collection Time: 02/04/17 11:45 AM  Result Value Ref Range   Troponin I 0.04 (HH) <0.03 ng/mL    Comment: CRITICAL RESULT CALLED TO, READ BACK BY AND VERIFIED WITH IRIS GUIDRY AT 1236 ON 02/04/17 [place].   Brain natriuretic peptide     Status: Abnormal   Collection Time: 02/04/17 11:45 AM  Result Value Ref Range   B Natriuretic Peptide 452.0 (H) 0.0 - 100.0 pg/mL  Troponin I     Status: Abnormal   Collection Time: 02/04/17  1:48 PM  Result Value Ref Range   Troponin I 0.03 (HH) <0.03 ng/mL    Comment: CRITICAL VALUE NOTED. VALUE IS CONSISTENT WITH PREVIOUSLY REPORTED/CALLED VALUE [place]  TSH     Status: None   Collection Time: 02/04/17  1:48 PM  Result Value Ref Range   TSH 1.489 0.350 - 4.500 uIU/mL    Comment: Performed by a 3rd Generation assay with a functional sensitivity of <=0.01 uIU/mL.  Blood gas, venous     Status: Abnormal   Collection Time: 02/04/17  4:30 PM  Result Value Ref Range   pH, Ven 7.40 7.250 - 7.430   pCO2, Ven 42 (L) 44.0 - 60.0 mmHg   Patient temperature 37.0    Collection site VENOUS    Sample type VENOUS   Lactic acid, plasma     Status: Abnormal   Collection Time: 02/04/17  4:30 PM  Result Value Ref Range   Lactic Acid, Venous 2.1 (HH) 0.5 - 1.9 mmol/L    Comment:  CRITICAL RESULT CALLED TO, READ BACK BY AND VERIFIED WITH IRIS GUIDRY ON 02/04/17 AT 1745 [facility]   Blood culture (routine x 2)     Status: None (Preliminary result)   Collection Time: 02/04/17  6:37 PM  Result Value Ref Range   Specimen Description BLOOD BLOOD LEFT ARM    Special Requests      BOTTLES DRAWN AEROBIC AND ANAEROBIC Blood Culture adequate volume   Culture NO GROWTH < 24 HOURS    Report Status PENDING   Blood culture (routine x 2)     Status: None (Preliminary result)   Collection Time: 02/04/17  6:37 PM  Result Value Ref Range   Specimen Description BLOOD LEFT ANTECUBITAL    Special Requests      BOTTLES DRAWN AEROBIC AND ANAEROBIC Blood Culture adequate volume   Culture  Setup Time      GRAM NEGATIVE RODS ANAEROBIC BOTTLE ONLY CRITICAL RESULT CALLED TO, READ BACK BY AND VERIFIED WITH: Chinita Greenland @ 1053 02/05/17 by [facility]    Culture [place]    Report Status PENDING   Blood Culture ID Panel (Reflexed)     Status: Abnormal   Collection Time: 02/04/17  6:37 PM  Result Value Ref Range   Enterococcus species NOT DETECTED NOT DETECTED   Listeria monocytogenes NOT DETECTED NOT DETECTED   Staphylococcus species NOT DETECTED NOT DETECTED   Staphylococcus aureus NOT DETECTED NOT DETECTED   Streptococcus species NOT DETECTED NOT DETECTED   Streptococcus  agalactiae NOT DETECTED NOT DETECTED   Streptococcus pneumoniae NOT DETECTED NOT DETECTED   Streptococcus pyogenes NOT DETECTED NOT DETECTED   Acinetobacter baumannii NOT DETECTED NOT DETECTED   Enterobacteriaceae species DETECTED (A) NOT DETECTED    Comment: Enterobacteriaceae represent a large family of gram-negative bacteria, not a single organism. CRITICAL RESULT CALLED TO, READ BACK BY AND VERIFIED WITH: Chinita Greenland @ 6378 02/05/17 by [place]    Enterobacter cloacae complex NOT DETECTED NOT DETECTED   Escherichia coli DETECTED (A) NOT DETECTED    Comment: CRITICAL RESULT CALLED TO, READ BACK BY AND VERIFIED  WITH: Chinita Greenland @ 5885 02/05/17 by [place]    Klebsiella oxytoca NOT DETECTED NOT DETECTED   Klebsiella pneumoniae NOT DETECTED NOT DETECTED   Proteus species NOT DETECTED NOT DETECTED   Serratia marcescens NOT DETECTED NOT DETECTED   Carbapenem resistance NOT DETECTED NOT DETECTED   Haemophilus influenzae NOT DETECTED NOT DETECTED   Neisseria meningitidis NOT DETECTED NOT DETECTED   Pseudomonas aeruginosa NOT DETECTED NOT DETECTED   Candida albicans NOT DETECTED NOT DETECTED   Candida glabrata NOT DETECTED NOT DETECTED   Candida krusei NOT DETECTED NOT DETECTED   Candida parapsilosis NOT DETECTED NOT DETECTED   Candida tropicalis NOT DETECTED NOT DETECTED  Comprehensive metabolic panel     Status: Abnormal   Collection Time: 02/05/17  3:51 AM  Result Value Ref Range   Sodium 142 135 - 145 mmol/L   Potassium 3.4 (L) 3.5 - 5.1 mmol/L   Chloride 110 101 - 111 mmol/L   CO2 25 22 - 32 mmol/L   Glucose, Bld 121 (H) 65 - 99 mg/dL   BUN 23 (H) 6 - 20 mg/dL   Creatinine, Ser 0.98 0.44 - 1.00 mg/dL   Calcium 8.1 (L) 8.9 - 10.3 mg/dL   Total Protein 5.8 (L) 6.5 - 8.1 g/dL   Albumin 2.6 (L) 3.5 - 5.0 g/dL   AST 164 (H) 15 - 41 U/L   ALT 137 (H) 14 - 54 U/L   Alkaline Phosphatase 101 38 - 126 U/L   Total Bilirubin 5.1 (H) 0.3 - 1.2 mg/dL   GFR calc non Af Amer 50 (L) >60 mL/min   GFR calc Af Amer 58 (L) >60 mL/min    Comment: (NOTE) The eGFR has been calculated using the CKD EPI equation. This calculation has not been validated in all clinical situations. eGFR's persistently <60 mL/min signify possible Chronic Kidney Disease.    Anion gap 7 5 - 15  CBC     Status: Abnormal   Collection Time: 02/05/17  3:51 AM  Result Value Ref Range   WBC 9.0 3.6 - 11.0 K/uL   RBC 3.78 (L) 3.80 - 5.20 MIL/uL   Hemoglobin 11.6 (L) 12.0 - 16.0 g/dL   HCT 33.1 (L) 35.0 - 47.0 %   MCV 87.6 80.0 - 100.0 fL   MCH 30.5 26.0 - 34.0 pg   MCHC 34.9 32.0 - 36.0 g/dL   RDW 14.4 11.5 - 14.5 %   Platelets  178 150 - 440 K/uL  Glucose, capillary     Status: Abnormal   Collection Time: 02/05/17  8:25 AM  Result Value Ref Range   Glucose-Capillary 117 (H) 65 - 99 mg/dL   Comment 1 Notify RN   MRSA PCR Screening     Status: None   Collection Time: 02/05/17  8:48 AM  Result Value Ref Range   MRSA by PCR NEGATIVE NEGATIVE    Comment:  The GeneXpert MRSA Assay (FDA approved for NASAL specimens only), is one component of a comprehensive MRSA colonization surveillance program. It is not intended to diagnose MRSA infection nor to guide or monitor treatment for MRSA infections.   Glucose, capillary     Status: Abnormal   Collection Time: 02/05/17 12:18 PM  Result Value Ref Range   Glucose-Capillary 145 (H) 65 - 99 mg/dL   Comment 1 Notify RN    No components found for: ESR, C REACTIVE PROTEIN MICRO: Recent Results (from the past 720 hour(s))  Blood culture (routine x 2)     Status: None (Preliminary result)   Collection Time: 02/04/17  6:37 PM  Result Value Ref Range Status   Specimen Description BLOOD BLOOD LEFT ARM  Final   Special Requests   Final    BOTTLES DRAWN AEROBIC AND ANAEROBIC Blood Culture adequate volume   Culture NO GROWTH < 24 HOURS  Final   Report Status PENDING  Incomplete  Blood culture (routine x 2)     Status: None (Preliminary result)   Collection Time: 02/04/17  6:37 PM  Result Value Ref Range Status   Specimen Description BLOOD LEFT ANTECUBITAL  Final   Special Requests   Final    BOTTLES DRAWN AEROBIC AND ANAEROBIC Blood Culture adequate volume   Culture  Setup Time   Final    GRAM NEGATIVE RODS ANAEROBIC BOTTLE ONLY CRITICAL RESULT CALLED TO, READ BACK BY AND VERIFIED WITH: Chinita Greenland @ 1053 02/05/17 by [facility]    Culture [place]  Final   Report Status PENDING  Incomplete  Blood Culture ID Panel (Reflexed)     Status: Abnormal   Collection Time: 02/04/17  6:37 PM  Result Value Ref Range Status   Enterococcus species NOT DETECTED NOT  DETECTED Final   Listeria monocytogenes NOT DETECTED NOT DETECTED Final   Staphylococcus species NOT DETECTED NOT DETECTED Final   Staphylococcus aureus NOT DETECTED NOT DETECTED Final   Streptococcus species NOT DETECTED NOT DETECTED Final   Streptococcus agalactiae NOT DETECTED NOT DETECTED Final   Streptococcus pneumoniae NOT DETECTED NOT DETECTED Final   Streptococcus pyogenes NOT DETECTED NOT DETECTED Final   Acinetobacter baumannii NOT DETECTED NOT DETECTED Final   Enterobacteriaceae species DETECTED (A) NOT DETECTED Final    Comment: Enterobacteriaceae represent a large family of gram-negative bacteria, not a single organism. CRITICAL RESULT CALLED TO, READ BACK BY AND VERIFIED WITH: Chinita Greenland @ 4132 02/05/17 by [place]    Enterobacter cloacae complex NOT DETECTED NOT DETECTED Final   Escherichia coli DETECTED (A) NOT DETECTED Final    Comment: CRITICAL RESULT CALLED TO, READ BACK BY AND VERIFIED WITH: Chinita Greenland @ 4401 02/05/17 by [place]    Klebsiella oxytoca NOT DETECTED NOT DETECTED Final   Klebsiella pneumoniae NOT DETECTED NOT DETECTED Final   Proteus species NOT DETECTED NOT DETECTED Final   Serratia marcescens NOT DETECTED NOT DETECTED Final   Carbapenem resistance NOT DETECTED NOT DETECTED Final   Haemophilus influenzae NOT DETECTED NOT DETECTED Final   Neisseria meningitidis NOT DETECTED NOT DETECTED Final   Pseudomonas aeruginosa NOT DETECTED NOT DETECTED Final   Candida albicans NOT DETECTED NOT DETECTED Final   Candida glabrata NOT DETECTED NOT DETECTED Final   Candida krusei NOT DETECTED NOT DETECTED Final   Candida parapsilosis NOT DETECTED NOT DETECTED Final   Candida tropicalis NOT DETECTED NOT DETECTED Final  MRSA PCR Screening     Status: None   Collection Time: 02/05/17  8:48  AM  Result Value Ref Range Status   MRSA by PCR NEGATIVE NEGATIVE Final    Comment:        The GeneXpert MRSA Assay (FDA approved for NASAL specimens only), is one component of  a comprehensive MRSA colonization surveillance program. It is not intended to diagnose MRSA infection nor to guide or monitor treatment for MRSA infections.     IMAGING: Ct Head Wo Contrast  Result Date: 02/04/2017 CLINICAL DATA:  Generalized weakness.  Unable to walk. EXAM: CT HEAD WITHOUT CONTRAST TECHNIQUE: Contiguous axial images were obtained from the base of the skull through the vertex without intravenous contrast. COMPARISON:  None. FINDINGS: Brain: No evidence of acute infarction, hemorrhage, hydrocephalus, extra-axial collection or mass lesion/mass effect. Age congruent cerebral volume loss. Age normal white matter appearance. Vascular: No hyperdense vessel or unexpected calcification. Skull: Normal. Negative for fracture or focal lesion. Sinuses/Orbits: No acute finding.  Bilateral cataract resection. IMPRESSION: Age normal head CT. Electronically Signed   By: Monte Fantasia M.D.   On: 02/04/2017 17:22   Ct Chest Wo Contrast  Result Date: 02/04/2017 CLINICAL DATA:  Dyspnea.  Chronic.  Indeterminate chest radiograph. EXAM: CT CHEST WITHOUT CONTRAST TECHNIQUE: Multidetector CT imaging of the chest was performed following the standard protocol without IV contrast. COMPARISON:  Chest radiograph earlier today.  No prior CT. FINDINGS: Cardiovascular: Aortic and branch vessel atherosclerosis. Tortuous thoracic aorta. Moderate cardiomegaly, without pericardial effusion. Multivessel coronary artery atherosclerosis. Mediastinum/Nodes: No mediastinal or definite hilar adenopathy, given limitations of unenhanced CT. Lungs/Pleura: No pleural fluid. Right upper lobe 4 mm pulmonary nodule on image 33/ series 3. More inferior right upper lobe pulmonary nodule of 4 mm on image 43/ series 3. Right middle lobe scarring or subsegmental atelectasis 3 mm right lower lobe nodule on image 108/series 3. A subpleural right lower lobe 3 mm nodule on image 93/ series 3. Scattered left upper lobe pulmonary nodules  of 4 mm or less. A 5 mm left lower lobe pulmonary nodule on image 104/ series 3. Calcified granuloma in the left lower lobe. Subpleural left lower lobe 5 mm nodule on image 34/series 3. Mild subsegmental atelectasis or scar at the left lung base. No evidence of pneumonia. Upper Abdomen: Mild hepatic steatosis. Variant lateral segment left liver lobe extending in the left upper quadrant. Normal imaged portions of the spleen, stomach, pancreas, kidneys. Mild adrenal thickening bilaterally. Mild porta hepatis adenopathy including at 1.7 cm on image 142/series 2. Musculoskeletal: No acute osseous abnormality. IMPRESSION: 1. No evidence of pneumonia. 2. Scattered pulmonary nodules of maximally 5 mm. No follow-up needed if patient is low-risk. Non-contrast chest CT can be considered in 12 months if patient is high-risk. This recommendation follows the consensus statement: Guidelines for Management of Incidental Pulmonary Nodules Detected on CT Images: From the Fleischner Society 2017; Radiology 2017; 284:228-243. 3. Coronary artery atherosclerosis. Aortic Atherosclerosis (ICD10-I70.0). 4. Hepatic steatosis. Mild porta hepatis adenopathy could be reactive and related to steatosis. Correlate with abdominal complaints. Electronically Signed   By: Abigail Miyamoto M.D.   On: 02/04/2017 16:08   US Abdomen Complete  Result Date: 02/05/2017 CLINICAL DATA:  Abnormal LFTs EXAM: ABDOMEN ULTRASOUND COMPLETE COMPARISON:  None. FINDINGS: Gallbladder: The gallbladder is surgically absent. Common bile duct: Diameter: 12.4 mm Liver: Intrahepatic ductal dilatation is identified. No focal mass. Portal vein is patent on color Doppler imaging with normal direction of blood flow towards the liver. IVC: No abnormality visualized. Pancreas: Visualized portion unremarkable. Spleen: Size and appearance within normal limits. Right  Kidney: Length: 10.9 cm. There is a 19 mm cyst in the lower pole. Left Kidney: Length: 10.6 cm. Echogenicity within  normal limits. No mass or hydronephrosis visualized. Abdominal aorta: No aneurysm visualized. Other findings: None. IMPRESSION: 1. The common bile duct measures 12.4 mm. Central intrahepatic ductal dilatation. This is nonspecific after cholecystectomy. Recommend correlation with labs. If there is concern for biliary obstruction, recommend an MRCP. 2. No other acute abnormalities. Electronically Signed   By: Dorise Bullion III M.D   On: 02/05/2017 12:38   Dg Chest Portable 1 View  Result Date: 02/04/2017 CLINICAL DATA:  83 year old female with weakness EXAM: PORTABLE CHEST 1 VIEW COMPARISON:  None. FINDINGS: Cardiomegaly identified. Bibasilar opacities are noted -question atelectasis, scarring or possibly airspace disease. No evidence of pleural effusion or pneumothorax. No acute bony abnormalities are present. IMPRESSION: Bibasilar opacities which may represent atelectasis, scarring or airspace disease. Cardiomegaly. Electronically Signed   By: Margarette Canada M.D.   On: 02/04/2017 12:20    Assessment:   ODETTA FORNESS is a 83 y.o. female admitted with fevers and weakness as well as elevated LFTs, WBC 15. BCX + E coli, RUQ USS with intrahepatic ductal dilation.  She reports prior removal of biliary stone by Dr Candace Cruise but I do not see records or ERCP. Clinically reports feeling much better. Suspect biliary source.   Recommendations Cont Meropenem pending ID and sensis Agree with MRCP Will likely be able to treat with oral abx for 10-14 days Thank you very much for allowing me to participate in the care of this patient. Please call with questions.   Cheral Marker. Ola Spurr, MD

## 2017-02-05 NOTE — Progress Notes (Signed)
Physical Therapy Evaluation Patient Details Name: Cheryl Rios MRN: [id number] DOB: [birth year] Today's Date: 02/05/2017   History of Present Illness  The patient denies anorexia,  weight loss,, vision loss, decreased hearing, hoarseness, chest pain, syncope, dyspnea on exertion, peripheral edema, balance deficits, hemoptysis, abdominal pain, melena, hematochezia, severe indigestion/heartburn, hematuria, incontinence, genital sores, suspicious skin lesions, transient blindness, depression, unusual weight change, abnormal bleeding, enlarged lymph nodes, angioedema, and breast masses  Clinical Impression  Patient is 26 and presents with modified independent bed mobility, modified transfers sit to stand with RW. She ambulates with RW 150 feet with modified independence. She had 3/5 strength BLE.  She will benefit from HHPT at [facility].  She will benefit from skilled PT to improve mobility and strength.     Follow Up Recommendations Home health PT    Equipment Recommendations  Rolling walker with 5" wheels    Recommendations for Other Services       Precautions / Restrictions Precautions Precautions: None Restrictions Weight Bearing Restrictions: No      Mobility  Bed Mobility Overal bed mobility: Modified Independent             General bed mobility comments: uses the hospital bed   Transfers Overall transfer level: Modified independent Equipment used: Rolling walker (2 wheeled)                Ambulation/Gait Ambulation/Gait assistance: Modified independent (Device/Increase time) Ambulation Distance (Feet): 150 Feet Assistive device: Rolling walker (2 wheeled) Gait Pattern/deviations: Step-to pattern     General Gait Details: decreased gait speed  Stairs            Wheelchair Mobility    Modified Rankin (Stroke Patients Only)       Balance Overall balance assessment: Needs assistance Sitting-balance support: Bilateral upper extremity  supported Sitting balance-Leahy Scale: Good     Standing balance support: Bilateral upper extremity supported Standing balance-Leahy Scale: Good                               Pertinent Vitals/Pain Pain Assessment: No/denies pain    Home Living Family/patient expects to be discharged to:: Private residence Living Arrangements: Alone Available Help at Discharge: Friend(s) Type of Home: House Home Access: Stairs to enter Entrance Stairs-Rails: Right Entrance Stairs-Number of Steps: 1 Home Layout: One level Home Equipment: Walker - 2 wheels;Cane - single point      Prior Function Level of Independence: Independent with assistive device(s)               Hand Dominance        Extremity/Trunk Assessment   Upper Extremity Assessment Upper Extremity Assessment: Overall WFL for tasks assessed    Lower Extremity Assessment Lower Extremity Assessment: Overall WFL for tasks assessed    Cervical / Trunk Assessment Cervical / Trunk Assessment: Normal  Communication   Communication: No difficulties  Cognition Arousal/Alertness: Awake/alert Behavior During Therapy: WFL for tasks assessed/performed Overall Cognitive Status: Within Functional Limits for tasks assessed                                        General Comments      Exercises     Assessment/Plan    PT Assessment Patient needs continued PT services  PT Problem List Decreased strength;Decreased activity tolerance;Decreased knowledge of use of DME;Decreased mobility  PT Treatment Interventions Gait training;Functional mobility training;Therapeutic activities;Therapeutic exercise    PT Goals (Current goals can be found in the Care Plan section)  Acute Rehab PT Goals Patient Stated Goal: to walk better PT Goal Formulation: With patient Time For Goal Achievement: 02/19/17 Potential to Achieve Goals: Good    Frequency Min 2X/week   Barriers to discharge         Co-evaluation               AM-PAC PT "6 Clicks" Daily Activity  Outcome Measure Difficulty turning over in bed (including adjusting bedclothes, sheets and blankets)?: Unable Difficulty moving from lying on back to sitting on the side of the bed? : A Little Difficulty sitting down on and standing up from a chair with arms (e.g., wheelchair, bedside commode, etc,.)?: A Little Help needed moving to and from a bed to chair (including a wheelchair)?: A Little Help needed walking in hospital room?: A Little Help needed climbing 3-5 steps with a railing? : A Little 6 Click Score: 16    End of Session Equipment Utilized During Treatment: Gait belt Activity Tolerance: Patient tolerated treatment well Patient left: in chair;with chair alarm set   PT Visit Diagnosis: Muscle weakness (generalized) (M62.81);Difficulty in walking, not elsewhere classified (R26.2)    Time: 0130-0200 PT Time Calculation (min) (ACUTE ONLY): 30 min   Charges:   PT Evaluation $PT Eval Low Complexity: 1 Low PT Treatments $Gait Training: 8-22 mins   PT G Codes:   PT G-Codes **NOT FOR INPATIENT CLASS** Functional Assessment Tool Used: AM-PAC 6 Clicks Basic Mobility;Clinical judgement Functional Limitation: Mobility: Walking and moving around Mobility: Walking and Moving Around Current Status (Z6109): At least 20 percent but less than 40 percent impaired, limited or restricted Mobility: Walking and Moving Around Goal Status [phone]): At least 1 percent but less than 20 percent impaired, limited or restricted  Ezekiel Ina, PT, DPT  Jones Broom S 02/05/2017, 2:41 PM

## 2017-02-05 NOTE — Progress Notes (Signed)
Patient transported to [place] by radiology.

## 2017-02-05 NOTE — Clinical Social Work Note (Signed)
CSW received consult for nursing home placement. CSW will follow pending PT recommendation. Patient is obs status and may not qualify for insurance to cover SNF.  [name], MSW, Theresia Majors [phone]

## 2017-02-05 NOTE — Progress Notes (Signed)
Patient a&o, VSS. Resting in bed. No complaints at this time. Continue to monitor.

## 2017-02-05 NOTE — Progress Notes (Signed)
[facility] [place] [facility]   PATIENT NAME: Cheryl Rios    MR#:  [id number]  DATE OF BIRTH:  [birth year]  SUBJECTIVE:   Patient here due to fatigue, weakness and difficulty walking.  Also noted to have a fever. Fever has not resolved this morning but blood cultures are positive for Escherichia coli. Source remains unclear presently.  REVIEW OF SYSTEMS:    Review of Systems  Constitutional: Positive for fever. Negative for chills.  HENT: Negative for congestion and tinnitus.   Eyes: Negative for blurred vision and double vision.  Respiratory: Negative for cough, shortness of breath and wheezing.   Cardiovascular: Negative for chest pain, orthopnea and PND.  Gastrointestinal: Negative for abdominal pain, diarrhea, nausea and vomiting.  Genitourinary: Negative for dysuria and hematuria.  Neurological: Positive for weakness. Negative for dizziness, sensory change and focal weakness.  All other systems reviewed and are negative.   Nutrition: Heart healthy Tolerating Diet: Yes Tolerating PT: Await Eval.   DRUG ALLERGIES:  No Known Allergies  VITALS:  Blood pressure (!) 124/53, pulse 85, temperature 98.7 F (37.1 C), temperature source Oral, resp. rate 18, height  (1.575 m), weight 91.9 kg (202 lb 8 oz), SpO2 93 %.  PHYSICAL EXAMINATION:   Physical Exam  GENERAL:  83 y.o.-year-old obese patient lying in bed in no acute distress.  EYES: Pupils equal, round, reactive to light and accommodation. No scleral icterus. Extraocular muscles intact.  HEENT: Head atraumatic, normocephalic. Oropharynx and nasopharynx clear.  NECK:  Supple, no jugular venous distention. No thyroid enlargement, no tenderness.  LUNGS: Normal breath sounds bilaterally, no wheezing, rales, rhonchi. No use of accessory muscles of respiration.  CARDIOVASCULAR: S1, S2 normal. No murmurs, rubs, or gallops.  ABDOMEN: Soft, nontender, nondistended. Bowel sounds present. No organomegaly  or mass.  EXTREMITIES: No cyanosis, clubbing, +1-2 edema b/l.    NEUROLOGIC: Cranial nerves II through XII are intact. No focal Motor or sensory deficits b/l. Globally weak  PSYCHIATRIC: The patient is alert and oriented x 3.  SKIN: No obvious rash, lesion, or ulcer.    LABORATORY PANEL:   CBC  Recent Labs Lab 02/05/17 0351  WBC 9.0  HGB 11.6*  HCT 33.1*  PLT 178   ------------------------------------------------------------------------------------------------------------------  Chemistries   Recent Labs Lab 02/05/17 0351  NA 142  K 3.4*  CL 110  CO2 25  GLUCOSE 121*  BUN 23*  CREATININE 0.98  CALCIUM 8.1*  AST 164*  ALT 137*  ALKPHOS 101  BILITOT 5.1*   ------------------------------------------------------------------------------------------------------------------  Cardiac Enzymes  Recent Labs Lab 02/04/17 1348  TROPONINI 0.03*   ------------------------------------------------------------------------------------------------------------------  RADIOLOGY:  Ct Head Wo Contrast  Result Date: 02/04/2017 CLINICAL DATA:  Generalized weakness.  Unable to walk. EXAM: CT HEAD WITHOUT CONTRAST TECHNIQUE: Contiguous axial images were obtained from the base of the skull through the vertex without intravenous contrast. COMPARISON:  None. FINDINGS: Brain: No evidence of acute infarction, hemorrhage, hydrocephalus, extra-axial collection or mass lesion/mass effect. Age congruent cerebral volume loss. Age normal white matter appearance. Vascular: No hyperdense vessel or unexpected calcification. Skull: Normal. Negative for fracture or focal lesion. Sinuses/Orbits: No acute finding.  Bilateral cataract resection. IMPRESSION: Age normal head CT. Electronically Signed   By: Marnee Spring M.D.   On: 02/04/2017 17:22   Ct Chest Wo Contrast  Result Date: 02/04/2017 CLINICAL DATA:  Dyspnea.  Chronic.  Indeterminate chest radiograph. EXAM: CT CHEST WITHOUT CONTRAST TECHNIQUE:  Multidetector CT imaging of the chest was performed following the standard  protocol without IV contrast. COMPARISON:  Chest radiograph earlier today.  No prior CT. FINDINGS: Cardiovascular: Aortic and branch vessel atherosclerosis. Tortuous thoracic aorta. Moderate cardiomegaly, without pericardial effusion. Multivessel coronary artery atherosclerosis. Mediastinum/Nodes: No mediastinal or definite hilar adenopathy, given limitations of unenhanced CT. Lungs/Pleura: No pleural fluid. Right upper lobe 4 mm pulmonary nodule on image 33/ series 3. More inferior right upper lobe pulmonary nodule of 4 mm on image 43/ series 3. Right middle lobe scarring or subsegmental atelectasis 3 mm right lower lobe nodule on image 108/series 3. A subpleural right lower lobe 3 mm nodule on image 93/ series 3. Scattered left upper lobe pulmonary nodules of 4 mm or less. A 5 mm left lower lobe pulmonary nodule on image 104/ series 3. Calcified granuloma in the left lower lobe. Subpleural left lower lobe 5 mm nodule on image 34/series 3. Mild subsegmental atelectasis or scar at the left lung base. No evidence of pneumonia. Upper Abdomen: Mild hepatic steatosis. Variant lateral segment left liver lobe extending in the left upper quadrant. Normal imaged portions of the spleen, stomach, pancreas, kidneys. Mild adrenal thickening bilaterally. Mild porta hepatis adenopathy including at 1.7 cm on image 142/series 2. Musculoskeletal: No acute osseous abnormality. IMPRESSION: 1. No evidence of pneumonia. 2. Scattered pulmonary nodules of maximally 5 mm. No follow-up needed if patient is low-risk. Non-contrast chest CT can be considered in 12 months if patient is high-risk. This recommendation follows the consensus statement: Guidelines for Management of Incidental Pulmonary Nodules Detected on CT Images: From the Fleischner Society 2017; Radiology 2017; 284:228-243. 3. Coronary artery atherosclerosis. Aortic Atherosclerosis (ICD10-I70.0). 4.  Hepatic steatosis. Mild porta hepatis adenopathy could be reactive and related to steatosis. Correlate with abdominal complaints. Electronically Signed   By: Jeronimo Greaves M.D.   On: 02/04/2017 16:08   Dg Chest Portable 1 View  Result Date: 02/04/2017 CLINICAL DATA:  83 year old female with weakness EXAM: PORTABLE CHEST 1 VIEW COMPARISON:  None. FINDINGS: Cardiomegaly identified. Bibasilar opacities are noted -question atelectasis, scarring or possibly airspace disease. No evidence of pleural effusion or pneumothorax. No acute bony abnormalities are present. IMPRESSION: Bibasilar opacities which may represent atelectasis, scarring or airspace disease. Cardiomegaly. Electronically Signed   By: Harmon Pier M.D.   On: 02/04/2017 12:20     ASSESSMENT AND PLAN:   83 year old female with past medical history of obstructive sleep apnea, hypertension, hypothyroidism, osteoporosis, diabetes who presented to the hospital due to generalized weakness, difficulty walking, fatigue and fever.  1. Sepsis-patient presented with fever,leukocytosis, tachycardia but source unclear. Patient's chest x-ray and CT chest was negative for acute pathology. -Was on IV vancomycin, Zosyn. Blood cultures now positive for Escherichia coli. Will change to meropenem, DC vancomycin as MRSA PCR is negative. -Source still remains unclear, patient has abnormal LFTs and I will get abdominal ultrasound.  2. Generalized weakness/difficulty walking-etiology unclear. Secondary to sepsis/deconditioning. Continue treatment with antibiotics and supportive care as mentioned above. Await physical therapy consult.  3. Abnormal LFTs-etiology unclear. Patient denies any abdominal pain. -We'll get abdominal ultrasound, follow LFTs.  4.ssential hypertension-continue hydralazine, lisinopril, metoprolol.  5. Hypothyroidism-continue Synthroid.  6. Obstructive sleep apnea-continue CPAP.   All the records are reviewed and case discussed with  Care Management/Social Worker. Management plans discussed with the patient, family and they are in agreement.  CODE STATUS: Full code  DVT Prophylaxis: Hep. SQ  TOTAL TIME TAKING CARE OF THIS PATIENT: 30 minutes.   POSSIBLE D/C IN 1-2 DAYS, DEPENDING ON CLINICAL CONDITION.   Earlyne Iba.D  on 02/05/2017 at 11:09 AM  Between 7am to 6pm - Pager - [phone]  After 6pm go to [email or web address] - Social research officer, government  [facility]  Office  [phone]  CC: Primary care physician; Jerl Mina, MD

## 2017-02-05 NOTE — Progress Notes (Signed)
Pharmacy Antibiotic Note  Cheryl Rios is a 83 y.o. female admitted on 02/04/2017 with bacteremia.  Pharmacy has been consulted for Meropenem dosing.  Plan: Due to patient's CrCl of 58mL/min, patient will be started on meropenem 1g Q12H. Pharmacy will continue to monitor and adjust as needed.   Height:  (157.5 cm) Weight: 202 lb 8 oz (91.9 kg) IBW/kg (Calculated) : 50.1  Temp (24hrs), Avg:98.6 F (37 C), Min:98 F (36.7 C), Max:100.1 F (37.8 C)   Recent Labs Lab 02/04/17 1145 02/04/17 1630 02/05/17 0351  WBC 15.7*  --  9.0  CREATININE 1.01*  --  0.98  LATICACIDVEN  --  2.1*  --     Estimated Creatinine Clearance: 41 mL/min (by C-G formula based on SCr of 0.98 mg/dL).    No Known Allergies  Antimicrobials this admission: Meropenem 9/16>>  Zosyn 9/15>> 9/16 Vancomycin 9/15>> 9/16   Dose adjustments this admission:   Microbiology results: BCx: 9/15>> Ecoli, Enterobacteriaceae, sensitivities pending  UCx: 9/15>> MRSA PCR: Neg  Thank you for allowing pharmacy to be a part of this patient's care.  Yolanda Bonine, PharmD Pharmacy Resident 02/05/2017 11:34 AM

## 2017-02-06 ENCOUNTER — Inpatient Hospital Stay: Payer: MEDICARE

## 2017-02-06 LAB — COMPREHENSIVE METABOLIC PANEL
ALK PHOS: 106 U/L (ref 38–126)
ALT: 112 U/L — AB (ref 14–54)
AST: 106 U/L — ABNORMAL HIGH (ref 15–41)
Albumin: 2.5 g/dL — ABNORMAL LOW (ref 3.5–5.0)
Anion gap: 5 (ref 5–15)
BUN: 20 mg/dL (ref 6–20)
CALCIUM: 8.5 mg/dL — AB (ref 8.9–10.3)
CO2: 26 mmol/L (ref 22–32)
CREATININE: 1.01 mg/dL — AB (ref 0.44–1.00)
Chloride: 111 mmol/L (ref 101–111)
GFR calc non Af Amer: 48 mL/min — ABNORMAL LOW (ref >60)
GFR, EST AFRICAN AMERICAN: 55 mL/min — AB (ref >60)
Glucose, Bld: 140 mg/dL — ABNORMAL HIGH (ref 65–99)
Potassium: 3.6 mmol/L (ref 3.5–5.1)
SODIUM: 142 mmol/L (ref 135–145)
Total Bilirubin: 2.4 mg/dL — ABNORMAL HIGH (ref 0.3–1.2)
Total Protein: 6 g/dL — ABNORMAL LOW (ref 6.5–8.1)

## 2017-02-06 LAB — CBC
HEMATOCRIT: 32.6 % — AB (ref 35.0–47.0)
HEMOGLOBIN: 11.5 g/dL — AB (ref 12.0–16.0)
MCH: 31 pg (ref 26.0–34.0)
MCHC: 35.1 g/dL (ref 32.0–36.0)
MCV: 88.3 fL (ref 80.0–100.0)
Platelets: 181 10*3/uL (ref 150–440)
RBC: 3.7 MIL/uL — AB (ref 3.80–5.20)
RDW: 14.5 % (ref 11.5–14.5)
WBC: 6.5 10*3/uL (ref 3.6–11.0)

## 2017-02-06 LAB — GLUCOSE, CAPILLARY: Glucose-Capillary: 120 mg/dL — ABNORMAL HIGH (ref 65–99)

## 2017-02-06 LAB — URINE CULTURE

## 2017-02-06 MED ORDER — GADOBENATE DIMEGLUMINE 529 MG/ML IV SOLN
20.0000 mL | Freq: Once | INTRAVENOUS | Status: AC | PRN
  Administered 2017-02-06: 18 mL via INTRAVENOUS

## 2017-02-06 MED ORDER — IPRATROPIUM-ALBUTEROL 0.5-2.5 (3) MG/3ML IN SOLN
3.0000 mL | Freq: Four times a day (QID) | RESPIRATORY_TRACT | Status: DC
Start: 2017-02-06 — End: 2017-02-08
  Administered 2017-02-06 – 2017-02-08 (×4): 3 mL via RESPIRATORY_TRACT
  Filled 2017-02-06 (×4): qty 3

## 2017-02-06 NOTE — Progress Notes (Signed)
Pt is going for an abdominal MRI later this afternoon so she can not have anything to eat until after it has been performed. I have put on her board NPO until specified.

## 2017-02-06 NOTE — Care Management Note (Signed)
Case Management Note  Patient Details  Name: Cheryl Rios MRN: [id number] Date of Birth: [birth year]  Subjective/Objective:  RNCM assessment for discharge planning. Met with patient. She lives alone and was driving prior to admission. Able to provide her own adls including bathing and food preparation. She has a walker and a cane that she uses as needed. Pt recommending home health PT. Patient is agreeable. Offered choice of home health agencies and she has no preference. [facility] to [facility] for [facility]. Patient refused a home health aide.        Action/Plan: [facility] for HHPT.   Expected Discharge Date:                  Expected Discharge Plan:  [place]  In-House Referral:     Discharge planning Services  CM Consult  Post Acute Care Choice:  Home Health Choice offered to:  Patient  DME Arranged:    DME Agency:     HH Arranged:  PT [place]:  [place]  Status of Service:  In process, will continue to follow  If discussed at Long Length of Stay Meetings, dates discussed:    Additional Comments:  Jolly Mango, RN 02/06/2017, 2:08 PM

## 2017-02-06 NOTE — Progress Notes (Signed)
Patient ID: Cheryl Rios, female   DOB: 1925-12-25, 83 y.o.   MRN: [id number]  [facility] PROGRESS NOTE  Cheryl Rios NWG:[id number] DOB: 08/31/1925 DOA: 02/04/2017 PCP: Cheryl Mina, MD  HPI/Subjective: Patient feeling well. She asked when she can go home. She was found to have positive blood culture with Escherichia coli.  Objective: Vitals:   02/05/17 2045 02/06/17 0737  BP: (!) 114/48 (!) 155/57  Pulse: 84 81  Resp: 20   Temp: 98 F (36.7 C) 98.1 F (36.7 C)  SpO2: 95% 94%    Filed Weights   02/04/17 2114 02/05/17 0500 02/06/17 0500  Weight: 92 kg (202 lb 12.8 oz) 91.9 kg (202 lb 8 oz) 90.7 kg (199 lb 14.4 oz)    ROS: Review of Systems  Constitutional: Negative for chills and fever.  Eyes: Negative for blurred vision.  Respiratory: Negative for cough and shortness of breath.   Cardiovascular: Negative for chest pain.  Gastrointestinal: Negative for abdominal pain, constipation, diarrhea, nausea and vomiting.  Genitourinary: Negative for dysuria.  Musculoskeletal: Negative for joint pain.  Neurological: Negative for dizziness and headaches.   Exam: Physical Exam  HENT:  Nose: No mucosal edema.  Mouth/Throat: No oropharyngeal exudate or posterior oropharyngeal edema.  Eyes: Pupils are equal, round, and reactive to light. Conjunctivae, EOM and lids are normal.  Neck: No JVD present. Carotid bruit is not present. No edema present. No thyroid mass and no thyromegaly present.  Cardiovascular: S1 normal and S2 normal.  Exam reveals no gallop.   No murmur heard. Pulses:      Dorsalis pedis pulses are 2+ on the right side, and 2+ on the left side.  Respiratory: No respiratory distress. She has decreased breath sounds in the right lower field and the left lower field. She has wheezes in the left lower field. She has no rhonchi. She has no rales.  GI: Soft. Bowel sounds are normal. There is no tenderness.  Musculoskeletal:       Right shoulder: She exhibits no  swelling.  Lymphadenopathy:    She has no cervical adenopathy.  Neurological: She is alert. No cranial nerve deficit.  Skin: Skin is warm. No rash noted. Nails show no clubbing.  Psychiatric: She has a normal mood and affect.      Data Reviewed: Basic Metabolic Panel:  Recent Labs Lab 02/04/17 1145 02/05/17 0351 02/06/17 0323  NA 143 142 142  K 3.5 3.4* 3.6  CL 108 110 111  CO2 GLUCOSE 183* 121* 140*  BUN 25* 23* 20  CREATININE 1.01* 0.98 1.01*  CALCIUM 8.8* 8.1* 8.5*   Liver Function Tests:  Recent Labs Lab 02/05/17 0351 02/06/17 0323  AST 164* 106*  ALT 137* 112*  ALKPHOS 101 106  BILITOT 5.1* 2.4*  PROT 5.8* 6.0*  ALBUMIN 2.6* 2.5*   CBC:  Recent Labs Lab 02/04/17 1145 02/05/17 0351 02/06/17 0323  WBC 15.7* 9.0 6.5  HGB 13.4 11.6* 11.5*  HCT 39.8 33.1* 32.6*  MCV 88.8 87.6 88.3  PLT 209 178 181   Cardiac Enzymes:  Recent Labs Lab 02/04/17 1145 02/04/17 1348  TROPONINI 0.04* 0.03*   BNP (last 3 results)  Recent Labs  02/04/17 1145  BNP 452.0*     CBG:  Recent Labs Lab 02/05/17 0825 02/05/17 1218 02/06/17 0827  GLUCAP 117* 145* 120*    Recent Results (from the past 240 hour(s))  Urine culture     Status: Abnormal   Collection Time: 02/04/17 11:45  AM  Result Value Ref Range Status   Specimen Description URINE, RANDOM  Final   Special Requests NONE  Final   Culture MULTIPLE SPECIES PRESENT, SUGGEST RECOLLECTION (A)  Final   Report Status 02/06/2017 FINAL  Final  Blood culture (routine x 2)     Status: None (Preliminary result)   Collection Time: 02/04/17  6:37 PM  Result Value Ref Range Status   Specimen Description BLOOD BLOOD LEFT ARM  Final   Special Requests   Final    BOTTLES DRAWN AEROBIC AND ANAEROBIC Blood Culture adequate volume   Culture NO GROWTH 2 DAYS  Final   Report Status PENDING  Incomplete  Blood culture (routine x 2)     Status: Abnormal (Preliminary result)   Collection Time: 02/04/17  6:37 PM   Result Value Ref Range Status   Specimen Description BLOOD LEFT ANTECUBITAL  Final   Special Requests   Final    BOTTLES DRAWN AEROBIC AND ANAEROBIC Blood Culture adequate volume   Culture  Setup Time   Final    GRAM NEGATIVE RODS ANAEROBIC BOTTLE ONLY CRITICAL RESULT CALLED TO, READ BACK BY AND VERIFIED WITH: Bari Mantis @ 5784 02/05/17 by [facility]    Culture (A)  Final    ESCHERICHIA COLI SUSCEPTIBILITIES TO FOLLOW Performed at [facility], [address], [place], [place] [address]    Report Status PENDING  Incomplete  Blood Culture ID Panel (Reflexed)     Status: Abnormal   Collection Time: 02/04/17  6:37 PM  Result Value Ref Range Status   Enterococcus species NOT DETECTED NOT DETECTED Final   Listeria monocytogenes NOT DETECTED NOT DETECTED Final   Staphylococcus species NOT DETECTED NOT DETECTED Final   Staphylococcus aureus NOT DETECTED NOT DETECTED Final   Streptococcus species NOT DETECTED NOT DETECTED Final   Streptococcus agalactiae NOT DETECTED NOT DETECTED Final   Streptococcus pneumoniae NOT DETECTED NOT DETECTED Final   Streptococcus pyogenes NOT DETECTED NOT DETECTED Final   Acinetobacter baumannii NOT DETECTED NOT DETECTED Final   Enterobacteriaceae species DETECTED (A) NOT DETECTED Final    Comment: Enterobacteriaceae represent a large family of gram-negative bacteria, not a single organism. CRITICAL RESULT CALLED TO, READ BACK BY AND VERIFIED WITH: Bari Mantis @ 5284 02/05/17 by [facility]    Enterobacter cloacae complex NOT DETECTED NOT DETECTED Final   Escherichia coli DETECTED (A) NOT DETECTED Final    Comment: CRITICAL RESULT CALLED TO, READ BACK BY AND VERIFIED WITH: Bari Mantis @ 1324 02/05/17 by [facility]    Klebsiella oxytoca NOT DETECTED NOT DETECTED Final   Klebsiella pneumoniae NOT DETECTED NOT DETECTED Final   Proteus species NOT DETECTED NOT DETECTED Final   Serratia marcescens NOT DETECTED NOT DETECTED Final   Carbapenem resistance NOT  DETECTED NOT DETECTED Final   Haemophilus influenzae NOT DETECTED NOT DETECTED Final   Neisseria meningitidis NOT DETECTED NOT DETECTED Final   Pseudomonas aeruginosa NOT DETECTED NOT DETECTED Final   Candida albicans NOT DETECTED NOT DETECTED Final   Candida glabrata NOT DETECTED NOT DETECTED Final   Candida krusei NOT DETECTED NOT DETECTED Final   Candida parapsilosis NOT DETECTED NOT DETECTED Final   Candida tropicalis NOT DETECTED NOT DETECTED Final  MRSA PCR Screening     Status: None   Collection Time: 02/05/17  8:48 AM  Result Value Ref Range Status   MRSA by PCR NEGATIVE NEGATIVE Final    Comment:        The GeneXpert MRSA Assay (FDA approved  for NASAL specimens only), is one component of a comprehensive MRSA colonization surveillance program. It is not intended to diagnose MRSA infection nor to guide or monitor treatment for MRSA infections.      Studies: Ct Head Wo Contrast  Result Date: 02/04/2017 CLINICAL DATA:  Generalized weakness.  Unable to walk. EXAM: CT HEAD WITHOUT CONTRAST TECHNIQUE: Contiguous axial images were obtained from the base of the skull through the vertex without intravenous contrast. COMPARISON:  None. FINDINGS: Brain: No evidence of acute infarction, hemorrhage, hydrocephalus, extra-axial collection or mass lesion/mass effect. Age congruent cerebral volume loss. Age normal white matter appearance. Vascular: No hyperdense vessel or unexpected calcification. Skull: Normal. Negative for fracture or focal lesion. Sinuses/Orbits: No acute finding.  Bilateral cataract resection. IMPRESSION: Age normal head CT. Electronically Signed   By: Marnee Spring M.D.   On: 02/04/2017 17:22   Ct Chest Wo Contrast  Result Date: 02/04/2017 CLINICAL DATA:  Dyspnea.  Chronic.  Indeterminate chest radiograph. EXAM: CT CHEST WITHOUT CONTRAST TECHNIQUE: Multidetector CT imaging of the chest was performed following the standard protocol without IV contrast. COMPARISON:   Chest radiograph earlier today.  No prior CT. FINDINGS: Cardiovascular: Aortic and branch vessel atherosclerosis. Tortuous thoracic aorta. Moderate cardiomegaly, without pericardial effusion. Multivessel coronary artery atherosclerosis. Mediastinum/Nodes: No mediastinal or definite hilar adenopathy, given limitations of unenhanced CT. Lungs/Pleura: No pleural fluid. Right upper lobe 4 mm pulmonary nodule on image 33/ series 3. More inferior right upper lobe pulmonary nodule of 4 mm on image 43/ series 3. Right middle lobe scarring or subsegmental atelectasis 3 mm right lower lobe nodule on image 108/series 3. A subpleural right lower lobe 3 mm nodule on image 93/ series 3. Scattered left upper lobe pulmonary nodules of 4 mm or less. A 5 mm left lower lobe pulmonary nodule on image 104/ series 3. Calcified granuloma in the left lower lobe. Subpleural left lower lobe 5 mm nodule on image 34/series 3. Mild subsegmental atelectasis or scar at the left lung base. No evidence of pneumonia. Upper Abdomen: Mild hepatic steatosis. Variant lateral segment left liver lobe extending in the left upper quadrant. Normal imaged portions of the spleen, stomach, pancreas, kidneys. Mild adrenal thickening bilaterally. Mild porta hepatis adenopathy including at 1.7 cm on image 142/series 2. Musculoskeletal: No acute osseous abnormality. IMPRESSION: 1. No evidence of pneumonia. 2. Scattered pulmonary nodules of maximally 5 mm. No follow-up needed if patient is low-risk. Non-contrast chest CT can be considered in 12 months if patient is high-risk. This recommendation follows the consensus statement: Guidelines for Management of Incidental Pulmonary Nodules Detected on CT Images: From the Fleischner Society 2017; Radiology 2017; 284:228-243. 3. Coronary artery atherosclerosis. Aortic Atherosclerosis (ICD10-I70.0). 4. Hepatic steatosis. Mild porta hepatis adenopathy could be reactive and related to steatosis. Correlate with abdominal  complaints. Electronically Signed   By: Jeronimo Greaves M.D.   On: 02/04/2017 16:08   US Abdomen Complete  Result Date: 02/05/2017 CLINICAL DATA:  Abnormal LFTs EXAM: ABDOMEN ULTRASOUND COMPLETE COMPARISON:  None. FINDINGS: Gallbladder: The gallbladder is surgically absent. Common bile duct: Diameter: 12.4 mm Liver: Intrahepatic ductal dilatation is identified. No focal mass. Portal vein is patent on color Doppler imaging with normal direction of blood flow towards the liver. IVC: No abnormality visualized. Pancreas: Visualized portion unremarkable. Spleen: Size and appearance within normal limits. Right Kidney: Length: 10.9 cm. There is a 19 mm cyst in the lower pole. Left Kidney: Length: 10.6 cm. Echogenicity within normal limits. No mass or hydronephrosis visualized. Abdominal aorta: No  aneurysm visualized. Other findings: None. IMPRESSION: 1. The common bile duct measures 12.4 mm. Central intrahepatic ductal dilatation. This is nonspecific after cholecystectomy. Recommend correlation with labs. If there is concern for biliary obstruction, recommend an MRCP. 2. No other acute abnormalities. Electronically Signed   By: Gerome Sam III M.D   On: 02/05/2017 12:38   Dg Chest Port 1 View  Result Date: 02/06/2017 CLINICAL DATA:  Cough. EXAM: PORTABLE CHEST 1 VIEW COMPARISON:  Radiograph of February 04, 2017. FINDINGS: Stable cardiomegaly. Atherosclerosis of thoracic aorta is noted. No pneumothorax or pleural effusion is noted. Stable bibasilar scarring or atelectasis is noted. Bony thorax is unremarkable. IMPRESSION: Aortic atherosclerosis. Stable bibasilar scarring or atelectasis is noted. Electronically Signed   By: Lupita Raider, M.D.   On: 02/06/2017 12:22    Scheduled Meds: . aspirin EC  81 mg Oral Daily  . docusate sodium  100 mg Oral BID  . furosemide  20 mg Oral Daily  . gabapentin  300 mg Oral TID  . heparin  5,000 Units Subcutaneous Q8H  . hydrALAZINE  50 mg Oral Daily  .  ipratropium-albuterol  3 mL Nebulization Q6H  . levothyroxine  75 mcg Oral QAC breakfast  . lisinopril  20 mg Oral Daily  . metoprolol succinate  25 mg Oral Daily  . omega-3 acid ethyl esters  1,000 mg Oral Daily  . pantoprazole  40 mg Oral Daily  . potassium chloride  10 mEq Oral Daily   Continuous Infusions: . meropenem (MERREM) IV Stopped (02/06/17 1004)    Assessment/Plan:  1. Escherichia coli sepsis. On meropenem for now. Hopefully can downgrade antibiotics soon. We'll get ID consultation. Not quite sure where this Escherichia coli sepsis is coming from. Repeat chest x-ray negative for pneumonia. Urinalysis unremarkable. Since the patient did have elevation in liver function test I will get an MRCP of the abdomen.  2. Generalized weakness. Physical therapy evaluation 3. Elevation liver function test. MRCP abdomen ordered. Repeat liver function tests tomorrow. 4. Essential hypertension on hydralazine and lisinopril and metoprolol 5. Hypothyroidism unspecified on Synthroid 6. Obstructive sleep apnea on CPAP  Code Status:     Code Status Orders        Start     Ordered   02/04/17 2104  Full code  Continuous     02/04/17 2104    Code Status History    Date Active Date Inactive Code Status Order ID Comments User Context   This patient has a current code status but no historical code status.     Family Communication: brother at the bedside Disposition Plan: Will need to have sensitivities on the Escherichia coli prior to disposition  Consultants:  Infectious disease  Antibiotics:  meropenem  Time spent: 28 minutes  Alford Highland  [employer]

## 2017-02-06 NOTE — Progress Notes (Signed)
Physical Therapy Treatment Patient Details Name: Cheryl Rios MRN: [id number] DOB: 04/19/1926 Today's Date: 02/06/2017    History of Present Illness The patient denies anorexia,  weight loss,, vision loss, decreased hearing, hoarseness, chest pain, syncope, dyspnea on exertion, peripheral edema, balance deficits, hemoptysis, abdominal pain, melena, hematochezia, severe indigestion/heartburn, hematuria, incontinence, genital sores, suspicious skin lesions, transient blindness, depression, unusual weight change, abnormal bleeding, enlarged lymph nodes, angioedema, and breast masses    PT Comments    Pt agreeable to PT; no voiced complaints. Pt requires cues for safe hand placement with transfers to/from bed and commode. Pt ambulates with supervision below baseline speed subjectively. Performs step to patter stair climbing with Min guard ascending and Min A descending due to fatigue/general weakness for safety. Able to ambulate well post stair climbing. Pt wishes return to bed at that time. Continue PT to progress strength, endurance to improve functional mobility towards baseline to allow for an optimal, safe return home.    Follow Up Recommendations  Home health PT     Equipment Recommendations  Rolling walker with 5" wheels    Recommendations for Other Services       Precautions / Restrictions Precautions Precautions: None Restrictions Weight Bearing Restrictions: No    Mobility  Bed Mobility Overal bed mobility: Modified Independent                Transfers Overall transfer level: Needs assistance Equipment used: Rolling walker (2 wheeled);4-wheeled walker Transfers: Sit to/from Stand Sit to Stand: Supervision         General transfer comment: Requires cues for safe hand placement at bed and commode  Ambulation/Gait Ambulation/Gait assistance: Supervision Ambulation Distance (Feet): 115 Feet (also to from bathroom) Assistive device: Rolling walker (2  wheeled) Gait Pattern/deviations: Step-through pattern;Decreased stride length Gait velocity: below baseline subjectively Gait velocity interpretation: Below normal speed for age/gender     Stairs Stairs: Yes   Stair Management: Two rails;Step to pattern Number of Stairs: 6 General stair comments: Mild fatigue, but performs safely. Min A for support descending  Wheelchair Mobility    Modified Rankin (Stroke Patients Only)       Balance Overall balance assessment: Needs assistance Sitting-balance support: Feet supported Sitting balance-Leahy Scale: Good     Standing balance support: Bilateral upper extremity supported Standing balance-Leahy Scale: Fair (Fair +)                              Cognition Arousal/Alertness: Awake/alert Behavior During Therapy: WFL for tasks assessed/performed Overall Cognitive Status: Within Functional Limits for tasks assessed                                        Exercises Other Exercises Other Exercises: set up/supervision for toileting; Assist donning/doffing brief in stand    General Comments        Pertinent Vitals/Pain Pain Assessment: No/denies pain    Home Living                      Prior Function            PT Goals (current goals can now be found in the care plan section) Progress towards PT goals: Progressing toward goals    Frequency    Min 2X/week      PT Plan Current plan remains appropriate  Co-evaluation              AM-PAC PT "6 Clicks" Daily Activity  Outcome Measure  Difficulty turning over in bed (including adjusting bedclothes, sheets and blankets)?: None Difficulty moving from lying on back to sitting on the side of the bed? : None Difficulty sitting down on and standing up from a chair with arms (e.g., wheelchair, bedside commode, etc,.)?: A Little Help needed moving to and from a bed to chair (including a wheelchair)?: A Little Help needed  walking in hospital room?: A Little Help needed climbing 3-5 steps with a railing? : A Little 6 Click Score: 20    End of Session Equipment Utilized During Treatment: Gait belt Activity Tolerance: Patient tolerated treatment well Patient left: in bed;with call bell/phone within reach;with bed alarm set   PT Visit Diagnosis: Muscle weakness (generalized) (M62.81);Difficulty in walking, not elsewhere classified (R26.2)     Time: 1541-1610 PT Time Calculation (min) (ACUTE ONLY): 29 min  Charges:  $Gait Training: 23-37 mins                    G Codes:  Functional Assessment Tool Used: AM-PAC 6 Clicks Basic Mobility    Scot Dock, PTA 02/06/2017, 5:19 PM

## 2017-02-06 NOTE — Progress Notes (Signed)
Pt stated she wanted to sit up on side of bed. I sat pt. Up on side of bed with bed locked on stand up position. She has no complaints at this time.

## 2017-02-07 ENCOUNTER — Inpatient Hospital Stay: Payer: MEDICARE | Admitting: Anesthesiology

## 2017-02-07 ENCOUNTER — Encounter
Admission: EM | Disposition: A | Discharge status: Home Health | Payer: Self-pay | Source: Home / Self Care | Attending: Internal Medicine

## 2017-02-07 ENCOUNTER — Encounter: Payer: Self-pay | Admitting: Anesthesiology

## 2017-02-07 ENCOUNTER — Inpatient Hospital Stay: Payer: MEDICARE

## 2017-02-07 DIAGNOSIS — K8050 Calculus of bile duct without cholangitis or cholecystitis without obstruction: Secondary | ICD-10-CM

## 2017-02-07 HISTORY — PX: ERCP: SHX5425

## 2017-02-07 LAB — COMPREHENSIVE METABOLIC PANEL
ALBUMIN: 2.5 g/dL — AB (ref 3.5–5.0)
ALK PHOS: 123 U/L (ref 38–126)
ALT: 85 U/L — ABNORMAL HIGH (ref 14–54)
AST: 63 U/L — AB (ref 15–41)
Anion gap: 6 (ref 5–15)
BILIRUBIN TOTAL: 1.3 mg/dL — AB (ref 0.3–1.2)
BUN: 18 mg/dL (ref 6–20)
CALCIUM: 8.6 mg/dL — AB (ref 8.9–10.3)
CO2: 26 mmol/L (ref 22–32)
CREATININE: 1.06 mg/dL — AB (ref 0.44–1.00)
Chloride: 108 mmol/L (ref 101–111)
GFR calc Af Amer: 52 mL/min — ABNORMAL LOW (ref >60)
GFR, EST NON AFRICAN AMERICAN: 45 mL/min — AB (ref >60)
GLUCOSE: 129 mg/dL — AB (ref 65–99)
Potassium: 3.7 mmol/L (ref 3.5–5.1)
Sodium: 140 mmol/L (ref 135–145)
TOTAL PROTEIN: 6 g/dL — AB (ref 6.5–8.1)

## 2017-02-07 LAB — CULTURE, BLOOD (ROUTINE X 2): Special Requests: ADEQUATE

## 2017-02-07 LAB — GLUCOSE, CAPILLARY: GLUCOSE-CAPILLARY: 140 mg/dL — AB (ref 65–99)

## 2017-02-07 SURGERY — ERCP, WITH INTERVENTION IF INDICATED
Anesthesia: General

## 2017-02-07 SURGERY — ENDOSCOPIC RETROGRADE CHOLANGIOPANCREATOGRAPHY (ERCP) WITH PROPOFOL
Anesthesia: General

## 2017-02-07 MED ORDER — GLYCOPYRROLATE 0.2 MG/ML IJ SOLN
INTRAMUSCULAR | Status: AC
  Filled 2017-02-07: qty 1

## 2017-02-07 MED ORDER — EPHEDRINE SULFATE 50 MG/ML IJ SOLN
INTRAMUSCULAR | Status: AC
  Filled 2017-02-07: qty 1

## 2017-02-07 MED ORDER — SODIUM CHLORIDE 0.9 % IV SOLN
INTRAVENOUS | Status: DC
  Administered 2017-02-07: 09:00:00 via INTRAVENOUS

## 2017-02-07 MED ORDER — FENTANYL CITRATE (PF) 100 MCG/2ML IJ SOLN
INTRAMUSCULAR | Status: DC | PRN
  Administered 2017-02-07: 50 ug via INTRAVENOUS

## 2017-02-07 MED ORDER — LIDOCAINE HCL (CARDIAC) 20 MG/ML IV SOLN
INTRAVENOUS | Status: DC | PRN
  Administered 2017-02-07: 30 mg via INTRAVENOUS

## 2017-02-07 MED ORDER — INDOMETHACIN 50 MG RE SUPP
100.0000 mg | Freq: Once | RECTAL | Status: AC
  Administered 2017-02-07: 100 mg via RECTAL
  Filled 2017-02-07: qty 2

## 2017-02-07 MED ORDER — PROPOFOL 500 MG/50ML IV EMUL
INTRAVENOUS | Status: AC
  Filled 2017-02-07: qty 50

## 2017-02-07 MED ORDER — SODIUM CHLORIDE 0.9 % IV SOLN
3.0000 g | Freq: Four times a day (QID) | INTRAVENOUS | Status: DC
  Administered 2017-02-07 – 2017-02-08 (×3): 3 g via INTRAVENOUS
  Filled 2017-02-07 (×6): qty 3

## 2017-02-07 MED ORDER — EPHEDRINE SULFATE 50 MG/ML IJ SOLN
INTRAMUSCULAR | Status: DC | PRN
  Administered 2017-02-07 (×2): 5 mg via INTRAVENOUS

## 2017-02-07 MED ORDER — GLYCOPYRROLATE 0.2 MG/ML IJ SOLN
INTRAMUSCULAR | Status: DC | PRN
  Administered 2017-02-07: 0.1 mg via INTRAVENOUS

## 2017-02-07 MED ORDER — FENTANYL CITRATE (PF) 100 MCG/2ML IJ SOLN
INTRAMUSCULAR | Status: AC
  Filled 2017-02-07: qty 2

## 2017-02-07 MED ORDER — PROPOFOL 500 MG/50ML IV EMUL
INTRAVENOUS | Status: DC | PRN
  Administered 2017-02-07: 100 ug/kg/min via INTRAVENOUS

## 2017-02-07 MED ORDER — LIDOCAINE HCL (PF) 2 % IJ SOLN
INTRAMUSCULAR | Status: AC
  Filled 2017-02-07: qty 2

## 2017-02-07 NOTE — Anesthesia Postprocedure Evaluation (Signed)
Anesthesia Post Note  Patient: Cheryl Rios  Procedure(s) Performed: Procedure(s) (LRB): ENDOSCOPIC RETROGRADE CHOLANGIOPANCREATOGRAPHY (ERCP) (N/A)  Patient location during evaluation: PACU Anesthesia Type: General Level of consciousness: awake and alert and oriented Pain management: pain level controlled Vital Signs Assessment: post-procedure vital signs reviewed and stable Respiratory status: spontaneous breathing Cardiovascular status: blood pressure returned to baseline Anesthetic complications: no     Last Vitals:  Vitals:   02/07/17 1325 02/07/17 1335  BP: (!) 122/57 (!) 153/88  Pulse: 78   Resp: 19   Temp: 36.7 C   SpO2: 96%     Last Pain:  Vitals:   02/07/17 1325  TempSrc: Tympanic  PainSc:                  ,

## 2017-02-07 NOTE — Progress Notes (Signed)
Pharmacy Antibiotic Note  Cheryl Rios is a 83 y.o. female admitted on 02/04/2017 with intra-abdominal infection and E.coli bacteremia.  Pharmacy has been consulted for ampicillin/sulbactam dosing.  This is day #4 of antibiotics. ID is following. E.coli sensitivities have finalized and antibiotics are being changed to ampicillin/sulbactam per ID MD.  Plan: Ampicillin/sulbactam 3 g IV q6h  Height:  (157.5 cm) Weight: 199 lb (90.3 kg) IBW/kg (Calculated) : 50.1  Temp (24hrs), Avg:97.9 F (36.6 C), Min:97.6 F (36.4 C), Max:98.3 F (36.8 C)   Recent Labs Lab 02/04/17 1145 02/04/17 1630 02/05/17 0351 02/06/17 0323 02/07/17 0453  WBC 15.7*  --  9.0 6.5  --   CREATININE 1.01*  --  0.98 1.01* 1.06*  LATICACIDVEN  --  2.1*  --   --   --     Estimated Creatinine Clearance: 37.6 mL/min (A) (by C-G formula based on SCr of 1.06 mg/dL (H)).    No Known Allergies  Antimicrobials this admission: Piperacillin/tazobactam 9/15 >> 9/16 meropenem 9/16 >> 9/18 Ampicillin/sulbactam 9/18 >>  Dose adjustments this admission:  Microbiology results: 9/15 BCx: E.coli 9/15 UCx: Multiple species   Thank you for allowing pharmacy to be a part of this patient's care.  Cindi Carbon, PharmD, BCPS Clinical Pharmacist 02/07/2017 3:38 PM

## 2017-02-07 NOTE — Anesthesia Post-op Follow-up Note (Signed)
Anesthesia QCDR form completed.        

## 2017-02-07 NOTE — OR Nursing (Signed)
REPORT CALLED TO VIVIAN ON 1A. PT S/P ERCP WITH STONE EXTRACTION AND SPHINCTEROTOMY . PT TO RETURN TO ROOM. VSS

## 2017-02-07 NOTE — Progress Notes (Signed)
While rounding unit [facility] visited with pt. CH met pt and sister-in-law who was at bedside. Pt talked about her surgery, she also mentioned that she was hungry and was looking forward to go home. [place] listened to pt, encouraged pt and her sister-in-law, and offered silent prayer and pastoral presence.   02/07/17 1600  Clinical Encounter Type  Visited With Patient and family together  Visit Type Initial;Spiritual support  Referral From Chaplain  Consult/Referral To Chaplain  Spiritual Encounters  Spiritual Needs Other (Comment)

## 2017-02-07 NOTE — Op Note (Addendum)
[facility] Gastroenterology Patient Name: Cheryl Rios Procedure Date: 02/07/2017 12:19 PM MRN: [id number] Account #: [id number] Date of Birth: August 05, 1925 Admit Type: Inpatient Age: 83 Room: [facility] ENDO ROOM 4 Gender: Female Note Status: Finalized Procedure:            ERCP Indications:          Bile duct stone(s) Providers:            Midge Minium MD, MD Referring MD:         Rhona Leavens. Burnett Sheng, MD (Referring MD) Medicines:            Propofol per Anesthesia Complications:        No immediate complications. Procedure:            Pre-Anesthesia Assessment:                       - Prior to the procedure, a History and Physical was                        performed, and patient medications and allergies were                        reviewed. The patient's tolerance of previous                        anesthesia was also reviewed. The risks and benefits of                        the procedure and the sedation options and risks were                        discussed with the patient. All questions were                        answered, and informed consent was obtained. Prior                        Anticoagulants: The patient has taken no previous                        anticoagulant or antiplatelet agents. ASA Grade                        Assessment: II - A patient with mild systemic disease.                        After reviewing the risks and benefits, the patient was                        deemed in satisfactory condition to undergo the                        procedure.                       After obtaining informed consent, the scope was passed                        under direct vision. Throughout the procedure, the  patient's blood pressure, pulse, and oxygen saturations                        were monitored continuously. The ERCP was introduced                        through the mouth, and used to inject contrast into and   used to inject contrast into the bile duct. The ERCP                        was accomplished without difficulty. The patient                        tolerated the procedure well. Findings:      The scout film was normal. The esophagus was successfully intubated       under direct vision. The scope was advanced to a normal major papilla in       the descending duodenum without detailed examination of the pharynx,       larynx and associated structures, and upper GI tract. The upper GI tract       was grossly normal. The bile duct was deeply cannulated with the       short-nosed traction sphincterotome. Contrast was injected. I personally       interpreted the bile duct images. There was brisk flow of contrast       through the ducts. Image quality was excellent. Contrast extended to the       entire biliary tree. The lower third of the main bile duct contained one       stone, which was 10 mm in diameter. A wire was passed into the biliary       tree. Biliary sphincterotomy was made with a traction (standard)       sphincterotome using ERBE electrocautery. There was no       post-sphincterotomy bleeding. The biliary tree was swept with a 15 mm       balloon starting at the bifurcation. All stones were removed. Impression:           - Choledocholithiasis was found. Complete removal was                        accomplished by biliary sphincterotomy and balloon                        extraction.                       - A biliary sphincterotomy was performed.                       - The biliary tree was swept. Recommendation:       - Return patient to hospital ward for ongoing care. Procedure Code(s):    --- Professional ---                       [phone], Endoscopic retrograde cholangiopancreatography                        (ERCP); with removal of calculi/debris from                        biliary/pancreatic  duct(s)                       [id number], Endoscopic retrograde cholangiopancreatography                         (ERCP); with sphincterotomy/papillotomy                       [phone], Endoscopic catheterization of the biliary ductal                        system, radiological supervision and interpretation Diagnosis Code(s):    --- Professional ---                       K80.50, Calculus of bile duct without cholangitis or                        cholecystitis without obstruction CPT copyright 2016 [employer]. All rights reserved. The codes documented in this report are preliminary and upon coder review may  be revised to meet current compliance requirements. Midge Minium MD, MD 02/07/2017 1:14:48 PM This report has been signed electronically. Number of Addenda: 0 Note Initiated On: 02/07/2017 12:19 PM      [facility]

## 2017-02-07 NOTE — Progress Notes (Signed)
[facility] CLINIC INFECTIOUS DISEASE PROGRESS NOTE Date of Admission:  02/04/2017     ID: Cheryl Rios is a 83 y.o. female with E coli bacteremia and Cholangitis Principal Problem:   Fever Active Problems:   Weakness   Failure to thrive in adult   OA (osteoarthritis)   Sepsis (HCC)   Choledocholithiasis   Subjective: S/p ERCP, much better. No fevers  ROS  Eleven systems are reviewed and negative except per hpi  Medications:  Antibiotics Given (last 72 hours)    Date/Time Action Medication Dose Rate   02/04/17 1818 New Bag/Given   piperacillin-tazobactam (ZOSYN) IVPB 3.375 g 3.375 g 100 mL/hr   02/04/17 1845 New Bag/Given   vancomycin (VANCOCIN) IVPB 1000 mg/200 mL premix 1,000 mg 200 mL/hr   02/05/17 0442 New Bag/Given   piperacillin-tazobactam (ZOSYN) IVPB 3.375 g 3.375 g 12.5 mL/hr   02/05/17 0443 New Bag/Given   vancomycin (VANCOCIN) IVPB 1000 mg/200 mL premix 1,000 mg 200 mL/hr   02/05/17 0820 New Bag/Given   piperacillin-tazobactam (ZOSYN) IVPB 3.375 g 3.375 g 12.5 mL/hr   02/05/17 1330 New Bag/Given   meropenem (MERREM) 1 g in sodium chloride 0.9 % 100 mL IVPB 1 g 200 mL/hr   02/05/17 2307 New Bag/Given   meropenem (MERREM) 1 g in sodium chloride 0.9 % 100 mL IVPB 1 g 200 mL/hr   02/06/17 0934 New Bag/Given   meropenem (MERREM) 1 g in sodium chloride 0.9 % 100 mL IVPB 1 g 200 mL/hr   02/06/17 2243 New Bag/Given   meropenem (MERREM) 1 g in sodium chloride 0.9 % 100 mL IVPB 1 g 200 mL/hr   02/07/17 1029 New Bag/Given   meropenem (MERREM) 1 g in sodium chloride 0.9 % 100 mL IVPB 1 g 200 mL/hr     . aspirin EC  81 mg Oral Daily  . docusate sodium  100 mg Oral BID  . furosemide  20 mg Oral Daily  . gabapentin  300 mg Oral TID  . heparin  5,000 Units Subcutaneous Q8H  . hydrALAZINE  50 mg Oral Daily  . ipratropium-albuterol  3 mL Nebulization Q6H  . levothyroxine  75 mcg Oral QAC breakfast  . lisinopril  20 mg Oral Daily  . metoprolol succinate  25 mg Oral Daily   . omega-3 acid ethyl esters  1,000 mg Oral Daily  . pantoprazole  40 mg Oral Daily  . potassium chloride  10 mEq Oral Daily    Objective: Vital signs in last 24 hours: Temp:  [97.6 F (36.4 C)-98.3 F (36.8 C)] 97.6 F (36.4 C) (09/18 1522) Pulse Rate:  [72-83] 72 (09/18 1522) Resp:  [19] 19 (09/18 1355) BP: (122-157)/(52-88) 157/69 (09/18 1522) SpO2:  [92 %-98 %] 92 % (09/18 1522) Weight:  [90.3 kg (199 lb)] 90.3 kg (199 lb) (09/18 0404) Constitutional:  oriented to person, place, and time. appears well-developed and well-nourished. No distress. obese HENT: [place]/AT, PERRLA, no scleral icterus Mouth/Throat: Oropharynx is clear and moist. No oropharyngeal exudate.  Cardiovascular: Normal rate, regular rhythm and normal heart sounds. Exam reveals no gallop and no friction rub.  No murmur heard.  Pulmonary/Chest: Effort normal and breath sounds normal. No respiratory distress.  has no wheezes.  Neck = supple, no nuchal rigidity Abdominal: Soft. Bowel sounds are normal.  exhibits no distension. There is no tenderness.  Lymphadenopathy: no cervical adenopathy. No axillary adenopathy Neurological: alert and oriented to person, place, and time.  Skin: Skin is warm and dry. No rash noted. No  erythema.  Psychiatric: a normal mood and affect.  behavior is normal.    Lab Results  Recent Labs  02/05/17 0351 02/06/17 0323 02/07/17 0453  WBC 9.0 6.5  --   HGB 11.6* 11.5*  --   HCT 33.1* 32.6*  --   NA 142 142 140  K 3.4* 3.6 3.7  CL 110 111 108  CO2 BUN 23* 20 18  CREATININE 0.98 1.01* 1.06*    Microbiology: Results for orders placed or performed during the hospital encounter of 02/04/17  Urine culture     Status: Abnormal   Collection Time: 02/04/17 11:45 AM  Result Value Ref Range Status   Specimen Description URINE, RANDOM  Final   Special Requests NONE  Final   Culture MULTIPLE SPECIES PRESENT, SUGGEST RECOLLECTION (A)  Final   Report Status 02/06/2017 FINAL   Final  Blood culture (routine x 2)     Status: None (Preliminary result)   Collection Time: 02/04/17  6:37 PM  Result Value Ref Range Status   Specimen Description BLOOD BLOOD LEFT ARM  Final   Special Requests   Final    BOTTLES DRAWN AEROBIC AND ANAEROBIC Blood Culture adequate volume   Culture NO GROWTH 3 DAYS  Final   Report Status PENDING  Incomplete  Blood culture (routine x 2)     Status: Abnormal   Collection Time: 02/04/17  6:37 PM  Result Value Ref Range Status   Specimen Description BLOOD LEFT ANTECUBITAL  Final   Special Requests   Final    BOTTLES DRAWN AEROBIC AND ANAEROBIC Blood Culture adequate volume   Culture  Setup Time   Final    GRAM NEGATIVE RODS ANAEROBIC BOTTLE ONLY CRITICAL RESULT CALLED TO, READ BACK BY AND VERIFIED WITH: Bari Mantis @ [phone] 02/05/17 by [facility]    Culture ESCHERICHIA COLI (A)  Final   Report Status 02/07/2017 FINAL  Final   Organism ID, Bacteria ESCHERICHIA COLI  Final      Susceptibility   Escherichia coli - MIC*    AMPICILLIN 8 SENSITIVE Sensitive     CEFAZOLIN <=4 SENSITIVE Sensitive     CEFEPIME <=1 SENSITIVE Sensitive     CEFTAZIDIME <=1 SENSITIVE Sensitive     CEFTRIAXONE <=1 SENSITIVE Sensitive     CIPROFLOXACIN <=0.25 SENSITIVE Sensitive     GENTAMICIN <=1 SENSITIVE Sensitive     IMIPENEM <=0.25 SENSITIVE Sensitive     TRIMETH/SULFA <=20 SENSITIVE Sensitive     AMPICILLIN/SULBACTAM 4 SENSITIVE Sensitive     PIP/TAZO <=4 SENSITIVE Sensitive     Extended ESBL NEGATIVE Sensitive     * ESCHERICHIA COLI  Blood Culture ID Panel (Reflexed)     Status: Abnormal   Collection Time: 02/04/17  6:37 PM  Result Value Ref Range Status   Enterococcus species NOT DETECTED NOT DETECTED Final   Listeria monocytogenes NOT DETECTED NOT DETECTED Final   Staphylococcus species NOT DETECTED NOT DETECTED Final   Staphylococcus aureus NOT DETECTED NOT DETECTED Final   Streptococcus species NOT DETECTED NOT DETECTED Final   Streptococcus agalactiae  NOT DETECTED NOT DETECTED Final   Streptococcus pneumoniae NOT DETECTED NOT DETECTED Final   Streptococcus pyogenes NOT DETECTED NOT DETECTED Final   Acinetobacter baumannii NOT DETECTED NOT DETECTED Final   Enterobacteriaceae species DETECTED (A) NOT DETECTED Final    Comment: Enterobacteriaceae represent a large family of gram-negative bacteria, not a single organism. CRITICAL RESULT CALLED TO, READ BACK BY AND VERIFIED WITH: Bari Mantis @ [phone]  02/05/17 by [facility]    Enterobacter cloacae complex NOT DETECTED NOT DETECTED Final   Escherichia coli DETECTED (A) NOT DETECTED Final    Comment: CRITICAL RESULT CALLED TO, READ BACK BY AND VERIFIED WITH: Bari Mantis @ [phone] 02/05/17 by [facility]    Klebsiella oxytoca NOT DETECTED NOT DETECTED Final   Klebsiella pneumoniae NOT DETECTED NOT DETECTED Final   Proteus species NOT DETECTED NOT DETECTED Final   Serratia marcescens NOT DETECTED NOT DETECTED Final   Carbapenem resistance NOT DETECTED NOT DETECTED Final   Haemophilus influenzae NOT DETECTED NOT DETECTED Final   Neisseria meningitidis NOT DETECTED NOT DETECTED Final   Pseudomonas aeruginosa NOT DETECTED NOT DETECTED Final   Candida albicans NOT DETECTED NOT DETECTED Final   Candida glabrata NOT DETECTED NOT DETECTED Final   Candida krusei NOT DETECTED NOT DETECTED Final   Candida parapsilosis NOT DETECTED NOT DETECTED Final   Candida tropicalis NOT DETECTED NOT DETECTED Final  MRSA PCR Screening     Status: None   Collection Time: 02/05/17  8:48 AM  Result Value Ref Range Status   MRSA by PCR NEGATIVE NEGATIVE Final    Comment:        The GeneXpert MRSA Assay (FDA approved for NASAL specimens only), is one component of a comprehensive MRSA colonization surveillance program. It is not intended to diagnose MRSA infection nor to guide or monitor treatment for MRSA infections.     Studies/Results: Mr 3d Recon At Scanner  Result Date: 02/07/2017 CLINICAL DATA:  Weakness and  fever. Elevated liver is steatosis. Ultrasound showed intrahepatic biliary dilatation. EXAM: MRI ABDOMEN WITHOUT AND WITH CONTRAST (INCLUDING MRCP) TECHNIQUE: Multiplanar multisequence MR imaging of the abdomen was performed both before and after the administration of intravenous contrast. Heavily T2-weighted images of the biliary and pancreatic ducts were obtained, and three-dimensional MRCP images were rendered by post processing. CONTRAST:  18mL MULTIHANCE GADOBENATE DIMEGLUMINE 529 MG/ML IV SOLN COMPARISON:  ultrasound exam dated 02/05/2017 FINDINGS: Despite efforts by the technologist and patient, motion artifact is present on today's exam and could not be eliminated. This reduces exam sensitivity and specificity. Lower chest: Mild cardiomegaly. Hepatobiliary: Mild intrahepatic biliary dilatation. Common hepatic duct 1.3 cm diameter. Common bile duct 1.0 cm in diameter. On image 9/15 we demonstrate a 1.0 cm filling defect compatible with stone in the common bile duct. In addition, there is some truncation of the distal CBD in the vicinity of the ampulla, and a smaller ampullary stone cannot be totally excluded. Questionable filling defect in the slightly dilated cystic duct remnant on image 19/12. Pancreas: There several small dilated side ducts in the pancreatic tail. In the upper margin of the pancreatic body, a 1.7 by 1.0 by 1.1 cm slightly multilobulated cystic lesion is present on image 17/7, likely with an internal septation no obvious abnormal enhancement of this lesion although the post-contrast images are adversely affected by motion artifact. The there is a peripancreatic lymph node just above this lesion measuring 9 mm in short axis diameter on image 13/15 slightly complicating the assessment due to the degree of motion artifact. Connectivity of this cystic lesion with the dorsal pancreatic duct is possible. A second cystic lesion along the uncinate process of the pancreas measures 1.3 by 0.7 by 0.7  cm, image 10/15, no obvious enhancement. Branching fluid signal intensity dilated structure in the head of the pancreas measures 3.8 by 2.3 cm on images 11-12 12 through 13 series 15, for the most part these seen to not have significant  enhancement. Spleen:  Unremarkable Adrenals/Urinary Tract: Right kidney lower pole Bosniak category 1 cyst anteriorly. Adrenal glands unremarkable. Stomach/Bowel: 3.5 cm diverticulum of the distal transverse duodenum. Vascular/Lymphatic: Aortoiliac atherosclerotic vascular disease. Borderline enlarged portacaval lymph node at 1.5 cm in short axis. Other:  No supplemental non-categorized findings. Musculoskeletal: Lumbar spondylosis and degenerative disc disease. IMPRESSION: 1. Accuracy of this test is severely adversely affected by motion artifact. MRCP is best suited to patients who can hold their breath and remain relatively motionless during MRI. 2. 1.0 cm stone in the common bile duct. The CBD diameter is 1.0 cm and the stone itself is about 1.0 cm. Mild intrahepatic biliary dilatation. Difficult to totally exclude a tiny stone in the vicinity of the ampulla. 3. Various cystic lesions of the pancreas are present. A larger branching lesion which could represent dilated portions of the ventral pancreatic duct is noted in the head of the pancreas, 3.8 by 2.3 cm. Smaller lesions are present along the upper margin of the pancreatic body (1.7 by 1.0 cm) and along the uncinate process (1.3 by 0.7 cm). None of these appreciably enhance although the degree of motion artifact makes it worse small areas of abnormal enhancement or abnormal hypoenhancement might well be obscured. The most appropriate next workup is probably pancreatic protocol CT with and without contrast given the limitations of today's MRI by motion artifact, to further characterize these lesions (CT is less susceptible to motion artifact given the faster acquisition). In particular these could be sequela of prior  pancreatitis or entities such as intraductal papillary mucinous neoplasm, but given the degree of potential dilatation in the ventral pancreatic ductal system, CT examination to exclude an occult small solid mass in the pancreatic head is recommended. 4. Mild cardiomegaly. 5.  Aortic Atherosclerosis (ICD10-I70.0). 6. Borderline enlarged portacaval lymph node. 7. Lumbar spondylosis and degenerative disc disease. Electronically Signed   By: Gaylyn Rong M.D.   On: 02/07/2017 08:17   Dg Chest Port 1 View  Result Date: 02/06/2017 CLINICAL DATA:  Cough. EXAM: PORTABLE CHEST 1 VIEW COMPARISON:  Radiograph of February 04, 2017. FINDINGS: Stable cardiomegaly. Atherosclerosis of thoracic aorta is noted. No pneumothorax or pleural effusion is noted. Stable bibasilar scarring or atelectasis is noted. Bony thorax is unremarkable. IMPRESSION: Aortic atherosclerosis. Stable bibasilar scarring or atelectasis is noted. Electronically Signed   By: Lupita Raider, M.D.   On: 02/06/2017 12:22   Dg C-arm 1-60 Min-no Report  Result Date: 02/07/2017 Fluoroscopy was utilized by the requesting physician.  No radiographic interpretation.   Mr Abdomen Mrcp Vivien Rossetti Contast  Result Date: 02/07/2017 CLINICAL DATA:  Weakness and fever. Elevated liver is steatosis. Ultrasound showed intrahepatic biliary dilatation. EXAM: MRI ABDOMEN WITHOUT AND WITH CONTRAST (INCLUDING MRCP) TECHNIQUE: Multiplanar multisequence MR imaging of the abdomen was performed both before and after the administration of intravenous contrast. Heavily T2-weighted images of the biliary and pancreatic ducts were obtained, and three-dimensional MRCP images were rendered by post processing. CONTRAST:  18mL MULTIHANCE GADOBENATE DIMEGLUMINE 529 MG/ML IV SOLN COMPARISON:  ultrasound exam dated 02/05/2017 FINDINGS: Despite efforts by the technologist and patient, motion artifact is present on today's exam and could not be eliminated. This reduces exam sensitivity  and specificity. Lower chest: Mild cardiomegaly. Hepatobiliary: Mild intrahepatic biliary dilatation. Common hepatic duct 1.3 cm diameter. Common bile duct 1.0 cm in diameter. On image 9/15 we demonstrate a 1.0 cm filling defect compatible with stone in the common bile duct. In addition, there is some truncation of the distal  CBD in the vicinity of the ampulla, and a smaller ampullary stone cannot be totally excluded. Questionable filling defect in the slightly dilated cystic duct remnant on image 19/12. Pancreas: There several small dilated side ducts in the pancreatic tail. In the upper margin of the pancreatic body, a 1.7 by 1.0 by 1.1 cm slightly multilobulated cystic lesion is present on image 17/7, likely with an internal septation no obvious abnormal enhancement of this lesion although the post-contrast images are adversely affected by motion artifact. The there is a peripancreatic lymph node just above this lesion measuring 9 mm in short axis diameter on image 13/15 slightly complicating the assessment due to the degree of motion artifact. Connectivity of this cystic lesion with the dorsal pancreatic duct is possible. A second cystic lesion along the uncinate process of the pancreas measures 1.3 by 0.7 by 0.7 cm, image 10/15, no obvious enhancement. Branching fluid signal intensity dilated structure in the head of the pancreas measures 3.8 by 2.3 cm on images 11-12 12 through 13 series 15, for the most part these seen to not have significant enhancement. Spleen:  Unremarkable Adrenals/Urinary Tract: Right kidney lower pole Bosniak category 1 cyst anteriorly. Adrenal glands unremarkable. Stomach/Bowel: 3.5 cm diverticulum of the distal transverse duodenum. Vascular/Lymphatic: Aortoiliac atherosclerotic vascular disease. Borderline enlarged portacaval lymph node at 1.5 cm in short axis. Other:  No supplemental non-categorized findings. Musculoskeletal: Lumbar spondylosis and degenerative disc disease.  IMPRESSION: 1. Accuracy of this test is severely adversely affected by motion artifact. MRCP is best suited to patients who can hold their breath and remain relatively motionless during MRI. 2. 1.0 cm stone in the common bile duct. The CBD diameter is 1.0 cm and the stone itself is about 1.0 cm. Mild intrahepatic biliary dilatation. Difficult to totally exclude a tiny stone in the vicinity of the ampulla. 3. Various cystic lesions of the pancreas are present. A larger branching lesion which could represent dilated portions of the ventral pancreatic duct is noted in the head of the pancreas, 3.8 by 2.3 cm. Smaller lesions are present along the upper margin of the pancreatic body (1.7 by 1.0 cm) and along the uncinate process (1.3 by 0.7 cm). None of these appreciably enhance although the degree of motion artifact makes it worse small areas of abnormal enhancement or abnormal hypoenhancement might well be obscured. The most appropriate next workup is probably pancreatic protocol CT with and without contrast given the limitations of today's MRI by motion artifact, to further characterize these lesions (CT is less susceptible to motion artifact given the faster acquisition). In particular these could be sequela of prior pancreatitis or entities such as intraductal papillary mucinous neoplasm, but given the degree of potential dilatation in the ventral pancreatic ductal system, CT examination to exclude an occult small solid mass in the pancreatic head is recommended. 4. Mild cardiomegaly. 5.  Aortic Atherosclerosis (ICD10-I70.0). 6. Borderline enlarged portacaval lymph node. 7. Lumbar spondylosis and degenerative disc disease. Electronically Signed   By: Gaylyn Rong M.D.   On: 02/07/2017 08:17    Assessment/Plan: ALISHBA NAPLES is a 83 y.o. female admitted with fevers and weakness as well as elevated LFTs, WBC 15. BCX + E coli, RUQ USS with intrahepatic ductal dilation. Has had ERCP by Dr Servando Snare with removal  of stones Clinically reports feeling much better. Suspect biliary source as her cause of E coli bacteremia   Recommendations Change to unasyn while inpt then can dc on oral augmentin.  Likely mixed infection with the  stone so would not tailor to just the E coli but would include anaerobes. Rec a total 10 day abx course from admission - stop date 9/24/ No need for ID fu if clinically improving.  Thank you very much for the consult.  I WILL SIGN OFF.  Mick Sell   02/07/2017, 3:31 PM

## 2017-02-07 NOTE — Anesthesia Preprocedure Evaluation (Addendum)
Anesthesia Evaluation  Patient identified by MRN, date of birth, ID band Patient awake    Reviewed: Allergy & Precautions, NPO status , Patient's Chart, lab work & pertinent test results  Airway Mallampati: III       Dental   Pulmonary sleep apnea ,    Pulmonary exam normal        Cardiovascular hypertension, Pt. on medications Normal cardiovascular exam+ dysrhythmias      Neuro/Psych negative neurological ROS  negative psych ROS   GI/Hepatic negative GI ROS, Neg liver ROS, diverticulosis   Endo/Other  diabetes, Type 2, Oral Hypoglycemic AgentsHypothyroidism   Renal/GU negative Renal ROS     Musculoskeletal  (+) Arthritis , Osteoarthritis,    Abdominal Normal abdominal exam  (+)   Peds  Hematology negative hematology ROS (+)   Anesthesia Other Findings   Reproductive/Obstetrics                             Anesthesia Physical  Anesthesia Plan  ASA: III  Anesthesia Plan: General   Post-op Pain Management:    Induction: Intravenous  PONV Risk Score and Plan:   Airway Management Planned: Nasal Cannula  Additional Equipment:   Intra-op Plan:   Post-operative Plan:   Informed Consent: I have reviewed the patients History and Physical, chart, labs and discussed the procedure including the risks, benefits and alternatives for the proposed anesthesia with the patient or authorized representative who has indicated his/her understanding and acceptance.   Dental advisory given  Plan Discussed with: CRNA and Surgeon  Anesthesia Plan Comments:         Anesthesia Quick Evaluation

## 2017-02-07 NOTE — Progress Notes (Signed)
PT Cancellation Note  Patient Details Name: CAITLAND PORCHIA MRN: [id number] DOB: 08-12-1925   Cancelled Treatment:    Reason Eval/Treat Not Completed: Patient declined, no reason specified;Other (comment). Treatment attempted; pt declined noting she had a procedure/surgery earlier today and is fatigued. Pt noted to look very uncomfortable in the bed, and agreed when questioned. Pt/visitor wished assistance. Pt repositioned using trendelenburg bed and use of lower extremities and assist to move upward in bed and positioned with left side pillow under shoulder for lean. Pt with improved comfort and appreciative. No skilled PT services. Re attempt at a later date.    Scot Dock, PTA 02/07/2017, 4:56 PM

## 2017-02-07 NOTE — Transfer of Care (Signed)
Immediate Anesthesia Transfer of Care Note  Patient: Cheryl Rios  Procedure(s) Performed: Procedure(s): ENDOSCOPIC RETROGRADE CHOLANGIOPANCREATOGRAPHY (ERCP) (N/A)  Patient Location: PACU  Anesthesia Type:General  Level of Consciousness: awake and sedated  Airway & Oxygen Therapy: Patient Spontanous Breathing and Patient connected to nasal cannula oxygen  Post-op Assessment: Report given to RN and Post -op Vital signs reviewed and stable  Post vital signs: Reviewed and stable  Last Vitals:  Vitals:   02/07/17 1325 02/07/17 1335  BP: (!) 122/57 (!) 153/88  Pulse: 78   Resp: 19   Temp: 36.7 C   SpO2: 96%     Last Pain:  Vitals:   02/07/17 1325  TempSrc: Tympanic  PainSc:          Complications: No apparent anesthesia complications

## 2017-02-07 NOTE — Transfer of Care (Signed)
Immediate Anesthesia Transfer of Care Note  Patient: Cheryl Rios  Procedure(s) Performed: Procedure(s): ENDOSCOPIC RETROGRADE CHOLANGIOPANCREATOGRAPHY (ERCP) (N/A)  Patient Location: PACU  Anesthesia Type:General  Level of Consciousness: awake  Airway & Oxygen Therapy: Patient Spontanous Breathing and Patient connected to nasal cannula oxygen  Post-op Assessment: Report given to RN and Post -op Vital signs reviewed and stable  Post vital signs: Reviewed and stable  Last Vitals:  Vitals:   02/06/17 2104 02/07/17 0717  BP: (!) 136/52 128/61  Pulse: 83 74  Resp: 19   Temp: 36.8 C 36.6 C  SpO2: 96% 98%    Last Pain:  Vitals:   02/07/17 1228  TempSrc:   PainSc: 0-No pain         Complications: No apparent anesthesia complications

## 2017-02-07 NOTE — Anesthesia Procedure Notes (Signed)
Performed by: Henrietta Hoover Pre-anesthesia Checklist: Patient identified, Emergency Drugs available, Patient being monitored, Suction available and Timeout performed Patient Re-evaluated:Patient Re-evaluated prior to induction Oxygen Delivery Method: Nasal cannula Preoxygenation: Pre-oxygenation with 100% oxygen Induction Type: IV induction Airway Equipment and Method: Bite block Placement Confirmation: positive ETCO2 and CO2 detector

## 2017-02-07 NOTE — Progress Notes (Signed)
Patient ID: Cheryl Rios, female   DOB: Apr 27, 1926, 83 y.o.   MRN: [id number]  Patient ID: Cheryl Rios, female   DOB: 05-Nov-1925, 83 y.o.   MRN: [id number]  [facility] PROGRESS NOTE  Cheryl Rios NWG:[id number] DOB: 1925-11-04 DOA: 02/04/2017 PCP: Jerl Mina, MD  HPI/Subjective: Patient feeling well. She asked when she can go home. She was found to have positive blood culture with Escherichia coli.  Objective: Vitals:   02/06/17 2104 02/07/17 0717  BP: (!) 136/52 128/61  Pulse: 83 74  Resp: 19   Temp: 98.3 F (36.8 C) 97.8 F (36.6 C)  SpO2: 96% 98%    Filed Weights   02/05/17 0500 02/06/17 0500 02/07/17 0404  Weight: 91.9 kg (202 lb 8 oz) 90.7 kg (199 lb 14.4 oz) 90.3 kg (199 lb)    ROS: Review of Systems  Constitutional: Negative for chills and fever.  Eyes: Negative for blurred vision.  Respiratory: Negative for cough and shortness of breath.   Cardiovascular: Negative for chest pain.  Gastrointestinal: Negative for abdominal pain, constipation, diarrhea, nausea and vomiting.  Genitourinary: Negative for dysuria.  Musculoskeletal: Negative for joint pain.  Neurological: Negative for dizziness and headaches.   Exam: Physical Exam  HENT:  Nose: No mucosal edema.  Mouth/Throat: No oropharyngeal exudate or posterior oropharyngeal edema.  Eyes: Pupils are equal, round, and reactive to light. Conjunctivae, EOM and lids are normal.  Neck: No JVD present. Carotid bruit is not present. No edema present. No thyroid mass and no thyromegaly present.  Cardiovascular: S1 normal and S2 normal.  Exam reveals no gallop.   No murmur heard. Pulses:      Dorsalis pedis pulses are 2+ on the right side, and 2+ on the left side.  Respiratory: No respiratory distress. She has decreased breath sounds in the right lower field and the left lower field. She has wheezes in the left lower field. She has no rhonchi. She has no rales.  GI: Soft. Bowel sounds are normal. There  is no tenderness.  Musculoskeletal:       Right shoulder: She exhibits no swelling.  Lymphadenopathy:    She has no cervical adenopathy.  Neurological: She is alert. No cranial nerve deficit.  Skin: Skin is warm. No rash noted. Nails show no clubbing.  Psychiatric: She has a normal mood and affect.      Data Reviewed: Basic Metabolic Panel:  Recent Labs Lab 02/04/17 1145 02/05/17 0351 02/06/17 0323 02/07/17 0453  NA 143 142 142 140  K 3.5 3.4* 3.6 3.7  CL 108 110 111 108  CO2 GLUCOSE 183* 121* 140* 129*  BUN 25* 23* 20 18  CREATININE 1.01* 0.98 1.01* 1.06*  CALCIUM 8.8* 8.1* 8.5* 8.6*   Liver Function Tests:  Recent Labs Lab 02/05/17 0351 02/06/17 0323 02/07/17 0453  AST 164* 106* 63*  ALT 137* 112* 85*  ALKPHOS 101 106 123  BILITOT 5.1* 2.4* 1.3*  PROT 5.8* 6.0* 6.0*  ALBUMIN 2.6* 2.5* 2.5*   CBC:  Recent Labs Lab 02/04/17 1145 02/05/17 0351 02/06/17 0323  WBC 15.7* 9.0 6.5  HGB 13.4 11.6* 11.5*  HCT 39.8 33.1* 32.6*  MCV 88.8 87.6 88.3  PLT 209 178 181   Cardiac Enzymes:  Recent Labs Lab 02/04/17 1145 02/04/17 1348  TROPONINI 0.04* 0.03*   BNP (last 3 results)  Recent Labs  02/04/17 1145  BNP 452.0*     CBG:  Recent Labs Lab 02/05/17 0825 02/05/17 1218  02/06/17 0827 02/07/17 0731  GLUCAP 117* 145* 120* 140*    Recent Results (from the past 240 hour(s))  Urine culture     Status: Abnormal   Collection Time: 02/04/17 11:45 AM  Result Value Ref Range Status   Specimen Description URINE, RANDOM  Final   Special Requests NONE  Final   Culture MULTIPLE SPECIES PRESENT, SUGGEST RECOLLECTION (A)  Final   Report Status 02/06/2017 FINAL  Final  Blood culture (routine x 2)     Status: None (Preliminary result)   Collection Time: 02/04/17  6:37 PM  Result Value Ref Range Status   Specimen Description BLOOD BLOOD LEFT ARM  Final   Special Requests   Final    BOTTLES DRAWN AEROBIC AND ANAEROBIC Blood Culture adequate  volume   Culture NO GROWTH 3 DAYS  Final   Report Status PENDING  Incomplete  Blood culture (routine x 2)     Status: Abnormal   Collection Time: 02/04/17  6:37 PM  Result Value Ref Range Status   Specimen Description BLOOD LEFT ANTECUBITAL  Final   Special Requests   Final    BOTTLES DRAWN AEROBIC AND ANAEROBIC Blood Culture adequate volume   Culture  Setup Time   Final    GRAM NEGATIVE RODS ANAEROBIC BOTTLE ONLY CRITICAL RESULT CALLED TO, READ BACK BY AND VERIFIED WITH: Bari Mantis @ 1053 02/05/17 by [facility]    Culture ESCHERICHIA COLI (A)  Final   Report Status 02/07/2017 FINAL  Final   Organism ID, Bacteria ESCHERICHIA COLI  Final      Susceptibility   Escherichia coli - MIC*    AMPICILLIN 8 SENSITIVE Sensitive     CEFAZOLIN <=4 SENSITIVE Sensitive     CEFEPIME <=1 SENSITIVE Sensitive     CEFTAZIDIME <=1 SENSITIVE Sensitive     CEFTRIAXONE <=1 SENSITIVE Sensitive     CIPROFLOXACIN <=0.25 SENSITIVE Sensitive     GENTAMICIN <=1 SENSITIVE Sensitive     IMIPENEM <=0.25 SENSITIVE Sensitive     TRIMETH/SULFA <=20 SENSITIVE Sensitive     AMPICILLIN/SULBACTAM 4 SENSITIVE Sensitive     PIP/TAZO <=4 SENSITIVE Sensitive     Extended ESBL NEGATIVE Sensitive     * ESCHERICHIA COLI  Blood Culture ID Panel (Reflexed)     Status: Abnormal   Collection Time: 02/04/17  6:37 PM  Result Value Ref Range Status   Enterococcus species NOT DETECTED NOT DETECTED Final   Listeria monocytogenes NOT DETECTED NOT DETECTED Final   Staphylococcus species NOT DETECTED NOT DETECTED Final   Staphylococcus aureus NOT DETECTED NOT DETECTED Final   Streptococcus species NOT DETECTED NOT DETECTED Final   Streptococcus agalactiae NOT DETECTED NOT DETECTED Final   Streptococcus pneumoniae NOT DETECTED NOT DETECTED Final   Streptococcus pyogenes NOT DETECTED NOT DETECTED Final   Acinetobacter baumannii NOT DETECTED NOT DETECTED Final   Enterobacteriaceae species DETECTED (A) NOT DETECTED Final    Comment:  Enterobacteriaceae represent a large family of gram-negative bacteria, not a single organism. CRITICAL RESULT CALLED TO, READ BACK BY AND VERIFIED WITH: Bari Mantis @ 1610 02/05/17 by [facility]    Enterobacter cloacae complex NOT DETECTED NOT DETECTED Final   Escherichia coli DETECTED (A) NOT DETECTED Final    Comment: CRITICAL RESULT CALLED TO, READ BACK BY AND VERIFIED WITH: Bari Mantis @ 9604 02/05/17 by [facility]    Klebsiella oxytoca NOT DETECTED NOT DETECTED Final   Klebsiella pneumoniae NOT DETECTED NOT DETECTED Final   Proteus species NOT DETECTED NOT DETECTED Final  Serratia marcescens NOT DETECTED NOT DETECTED Final   Carbapenem resistance NOT DETECTED NOT DETECTED Final   Haemophilus influenzae NOT DETECTED NOT DETECTED Final   Neisseria meningitidis NOT DETECTED NOT DETECTED Final   Pseudomonas aeruginosa NOT DETECTED NOT DETECTED Final   Candida albicans NOT DETECTED NOT DETECTED Final   Candida glabrata NOT DETECTED NOT DETECTED Final   Candida krusei NOT DETECTED NOT DETECTED Final   Candida parapsilosis NOT DETECTED NOT DETECTED Final   Candida tropicalis NOT DETECTED NOT DETECTED Final  MRSA PCR Screening     Status: None   Collection Time: 02/05/17  8:48 AM  Result Value Ref Range Status   MRSA by PCR NEGATIVE NEGATIVE Final    Comment:        The GeneXpert MRSA Assay (FDA approved for NASAL specimens only), is one component of a comprehensive MRSA colonization surveillance program. It is not intended to diagnose MRSA infection nor to guide or monitor treatment for MRSA infections.      Studies: Mr 3d Recon At Scanner  Result Date: 02/07/2017 CLINICAL DATA:  Weakness and fever. Elevated liver is steatosis. Ultrasound showed intrahepatic biliary dilatation. EXAM: MRI ABDOMEN WITHOUT AND WITH CONTRAST (INCLUDING MRCP) TECHNIQUE: Multiplanar multisequence MR imaging of the abdomen was performed both before and after the administration of intravenous contrast.  Heavily T2-weighted images of the biliary and pancreatic ducts were obtained, and three-dimensional MRCP images were rendered by post processing. CONTRAST:  18mL MULTIHANCE GADOBENATE DIMEGLUMINE 529 MG/ML IV SOLN COMPARISON:  ultrasound exam dated 02/05/2017 FINDINGS: Despite efforts by the technologist and patient, motion artifact is present on today's exam and could not be eliminated. This reduces exam sensitivity and specificity. Lower chest: Mild cardiomegaly. Hepatobiliary: Mild intrahepatic biliary dilatation. Common hepatic duct 1.3 cm diameter. Common bile duct 1.0 cm in diameter. On image 9/15 we demonstrate a 1.0 cm filling defect compatible with stone in the common bile duct. In addition, there is some truncation of the distal CBD in the vicinity of the ampulla, and a smaller ampullary stone cannot be totally excluded. Questionable filling defect in the slightly dilated cystic duct remnant on image 19/12. Pancreas: There several small dilated side ducts in the pancreatic tail. In the upper margin of the pancreatic body, a 1.7 by 1.0 by 1.1 cm slightly multilobulated cystic lesion is present on image 17/7, likely with an internal septation no obvious abnormal enhancement of this lesion although the post-contrast images are adversely affected by motion artifact. The there is a peripancreatic lymph node just above this lesion measuring 9 mm in short axis diameter on image 13/15 slightly complicating the assessment due to the degree of motion artifact. Connectivity of this cystic lesion with the dorsal pancreatic duct is possible. A second cystic lesion along the uncinate process of the pancreas measures 1.3 by 0.7 by 0.7 cm, image 10/15, no obvious enhancement. Branching fluid signal intensity dilated structure in the head of the pancreas measures 3.8 by 2.3 cm on images 11-12 12 through 13 series 15, for the most part these seen to not have significant enhancement. Spleen:  Unremarkable Adrenals/Urinary  Tract: Right kidney lower pole Bosniak category 1 cyst anteriorly. Adrenal glands unremarkable. Stomach/Bowel: 3.5 cm diverticulum of the distal transverse duodenum. Vascular/Lymphatic: Aortoiliac atherosclerotic vascular disease. Borderline enlarged portacaval lymph node at 1.5 cm in short axis. Other:  No supplemental non-categorized findings. Musculoskeletal: Lumbar spondylosis and degenerative disc disease. IMPRESSION: 1. Accuracy of this test is severely adversely affected by motion artifact. MRCP is best suited  to patients who can hold their breath and remain relatively motionless during MRI. 2. 1.0 cm stone in the common bile duct. The CBD diameter is 1.0 cm and the stone itself is about 1.0 cm. Mild intrahepatic biliary dilatation. Difficult to totally exclude a tiny stone in the vicinity of the ampulla. 3. Various cystic lesions of the pancreas are present. A larger branching lesion which could represent dilated portions of the ventral pancreatic duct is noted in the head of the pancreas, 3.8 by 2.3 cm. Smaller lesions are present along the upper margin of the pancreatic body (1.7 by 1.0 cm) and along the uncinate process (1.3 by 0.7 cm). None of these appreciably enhance although the degree of motion artifact makes it worse small areas of abnormal enhancement or abnormal hypoenhancement might well be obscured. The most appropriate next workup is probably pancreatic protocol CT with and without contrast given the limitations of today's MRI by motion artifact, to further characterize these lesions (CT is less susceptible to motion artifact given the faster acquisition). In particular these could be sequela of prior pancreatitis or entities such as intraductal papillary mucinous neoplasm, but given the degree of potential dilatation in the ventral pancreatic ductal system, CT examination to exclude an occult small solid mass in the pancreatic head is recommended. 4. Mild cardiomegaly. 5.  Aortic  Atherosclerosis (ICD10-I70.0). 6. Borderline enlarged portacaval lymph node. 7. Lumbar spondylosis and degenerative disc disease. Electronically Signed   By: Gaylyn Rong M.D.   On: 02/07/2017 08:17   Dg Chest Port 1 View  Result Date: 02/06/2017 CLINICAL DATA:  Cough. EXAM: PORTABLE CHEST 1 VIEW COMPARISON:  Radiograph of February 04, 2017. FINDINGS: Stable cardiomegaly. Atherosclerosis of thoracic aorta is noted. No pneumothorax or pleural effusion is noted. Stable bibasilar scarring or atelectasis is noted. Bony thorax is unremarkable. IMPRESSION: Aortic atherosclerosis. Stable bibasilar scarring or atelectasis is noted. Electronically Signed   By: Lupita Raider, M.D.   On: 02/06/2017 12:22   Mr Abdomen Mrcp Vivien Rossetti Contast  Result Date: 02/07/2017 CLINICAL DATA:  Weakness and fever. Elevated liver is steatosis. Ultrasound showed intrahepatic biliary dilatation. EXAM: MRI ABDOMEN WITHOUT AND WITH CONTRAST (INCLUDING MRCP) TECHNIQUE: Multiplanar multisequence MR imaging of the abdomen was performed both before and after the administration of intravenous contrast. Heavily T2-weighted images of the biliary and pancreatic ducts were obtained, and three-dimensional MRCP images were rendered by post processing. CONTRAST:  18mL MULTIHANCE GADOBENATE DIMEGLUMINE 529 MG/ML IV SOLN COMPARISON:  ultrasound exam dated 02/05/2017 FINDINGS: Despite efforts by the technologist and patient, motion artifact is present on today's exam and could not be eliminated. This reduces exam sensitivity and specificity. Lower chest: Mild cardiomegaly. Hepatobiliary: Mild intrahepatic biliary dilatation. Common hepatic duct 1.3 cm diameter. Common bile duct 1.0 cm in diameter. On image 9/15 we demonstrate a 1.0 cm filling defect compatible with stone in the common bile duct. In addition, there is some truncation of the distal CBD in the vicinity of the ampulla, and a smaller ampullary stone cannot be totally excluded.  Questionable filling defect in the slightly dilated cystic duct remnant on image 19/12. Pancreas: There several small dilated side ducts in the pancreatic tail. In the upper margin of the pancreatic body, a 1.7 by 1.0 by 1.1 cm slightly multilobulated cystic lesion is present on image 17/7, likely with an internal septation no obvious abnormal enhancement of this lesion although the post-contrast images are adversely affected by motion artifact. The there is a peripancreatic lymph node just above  this lesion measuring 9 mm in short axis diameter on image 13/15 slightly complicating the assessment due to the degree of motion artifact. Connectivity of this cystic lesion with the dorsal pancreatic duct is possible. A second cystic lesion along the uncinate process of the pancreas measures 1.3 by 0.7 by 0.7 cm, image 10/15, no obvious enhancement. Branching fluid signal intensity dilated structure in the head of the pancreas measures 3.8 by 2.3 cm on images 11-12 12 through 13 series 15, for the most part these seen to not have significant enhancement. Spleen:  Unremarkable Adrenals/Urinary Tract: Right kidney lower pole Bosniak category 1 cyst anteriorly. Adrenal glands unremarkable. Stomach/Bowel: 3.5 cm diverticulum of the distal transverse duodenum. Vascular/Lymphatic: Aortoiliac atherosclerotic vascular disease. Borderline enlarged portacaval lymph node at 1.5 cm in short axis. Other:  No supplemental non-categorized findings. Musculoskeletal: Lumbar spondylosis and degenerative disc disease. IMPRESSION: 1. Accuracy of this test is severely adversely affected by motion artifact. MRCP is best suited to patients who can hold their breath and remain relatively motionless during MRI. 2. 1.0 cm stone in the common bile duct. The CBD diameter is 1.0 cm and the stone itself is about 1.0 cm. Mild intrahepatic biliary dilatation. Difficult to totally exclude a tiny stone in the vicinity of the ampulla. 3. Various cystic  lesions of the pancreas are present. A larger branching lesion which could represent dilated portions of the ventral pancreatic duct is noted in the head of the pancreas, 3.8 by 2.3 cm. Smaller lesions are present along the upper margin of the pancreatic body (1.7 by 1.0 cm) and along the uncinate process (1.3 by 0.7 cm). None of these appreciably enhance although the degree of motion artifact makes it worse small areas of abnormal enhancement or abnormal hypoenhancement might well be obscured. The most appropriate next workup is probably pancreatic protocol CT with and without contrast given the limitations of today's MRI by motion artifact, to further characterize these lesions (CT is less susceptible to motion artifact given the faster acquisition). In particular these could be sequela of prior pancreatitis or entities such as intraductal papillary mucinous neoplasm, but given the degree of potential dilatation in the ventral pancreatic ductal system, CT examination to exclude an occult small solid mass in the pancreatic head is recommended. 4. Mild cardiomegaly. 5.  Aortic Atherosclerosis (ICD10-I70.0). 6. Borderline enlarged portacaval lymph node. 7. Lumbar spondylosis and degenerative disc disease. Electronically Signed   By: Gaylyn Rong M.D.   On: 02/07/2017 08:17    Scheduled Meds: . [MAR Hold] aspirin EC  81 mg Oral Daily  . [MAR Hold] docusate sodium  100 mg Oral BID  . [MAR Hold] furosemide  20 mg Oral Daily  . [MAR Hold] gabapentin  300 mg Oral TID  . [MAR Hold] heparin  5,000 Units Subcutaneous Q8H  . [MAR Hold] hydrALAZINE  50 mg Oral Daily  . [MAR Hold] indomethacin  100 mg Rectal Once  . [MAR Hold] ipratropium-albuterol  3 mL Nebulization Q6H  . [MAR Hold] levothyroxine  75 mcg Oral QAC breakfast  . [MAR Hold] lisinopril  20 mg Oral Daily  . [MAR Hold] metoprolol succinate  25 mg Oral Daily  . [MAR Hold] omega-3 acid ethyl esters  1,000 mg Oral Daily  . [MAR Hold] pantoprazole   40 mg Oral Daily  . [MAR Hold] potassium chloride  10 mEq Oral Daily   Continuous Infusions: . sodium chloride 40 mL/hr at 02/07/17 0919  . [MAR Hold] meropenem (MERREM) IV Stopped (02/07/17 1059)  Assessment/Plan:  1. Escherichia coli sepsis secondary to choledocholithiasis. On meropenem for now. Hopefully can downgrade antibiotics soon this afternoon. Likely sepsis secondary to stone caught in the common bile duct. Case discussed with Dr. Servando Snare gastroenterology to set up an ERCP for today.  2. Generalized weakness. Physical therapy evaluation 3. Elevation liver function test secondary to choledocholithiasis  4. Essential hypertension on hydralazine and lisinopril and metoprolol 5. Hypothyroidism unspecified on Synthroid 6. Obstructive sleep apnea on CPAP 7. Gentle IV fluids while nothing by mouth for procedure.  Code Status:     Code Status Orders        Start     Ordered   02/04/17 2104  Full code  Continuous     02/04/17 2104    Code Status History    Date Active Date Inactive Code Status Order ID Comments User Context   This patient has a current code status but no historical code status.     Family Communication: brother on the phone Disposition Plan: will have to recover form ercp first  Consultants:  Infectious disease  gastroenterology  Antibiotics:  meropenem  Time spent: 28 minutes, case discussed with gastroenterology.  Alford Highland  [employer]

## 2017-02-08 ENCOUNTER — Encounter: Payer: Self-pay | Admitting: Gastroenterology

## 2017-02-08 LAB — COMPREHENSIVE METABOLIC PANEL
ALBUMIN: 2.5 g/dL — AB (ref 3.5–5.0)
ALT: 71 U/L — ABNORMAL HIGH (ref 14–54)
AST: 54 U/L — AB (ref 15–41)
Alkaline Phosphatase: 116 U/L (ref 38–126)
Anion gap: 6 (ref 5–15)
BUN: 16 mg/dL (ref 6–20)
CHLORIDE: 110 mmol/L (ref 101–111)
CO2: 26 mmol/L (ref 22–32)
Calcium: 8.6 mg/dL — ABNORMAL LOW (ref 8.9–10.3)
Creatinine, Ser: 0.78 mg/dL (ref 0.44–1.00)
GFR calc Af Amer: >60 mL/min (ref >60)
GFR calc non Af Amer: >60 mL/min (ref >60)
GLUCOSE: 95 mg/dL (ref 65–99)
POTASSIUM: 3.8 mmol/L (ref 3.5–5.1)
Sodium: 142 mmol/L (ref 135–145)
Total Bilirubin: 1.2 mg/dL (ref 0.3–1.2)
Total Protein: 6 g/dL — ABNORMAL LOW (ref 6.5–8.1)

## 2017-02-08 LAB — GLUCOSE, CAPILLARY: Glucose-Capillary: 106 mg/dL — ABNORMAL HIGH (ref 65–99)

## 2017-02-08 LAB — LIPASE, BLOOD: LIPASE: 15 U/L (ref 11–51)

## 2017-02-08 MED ORDER — AMOXICILLIN-POT CLAVULANATE 875-125 MG PO TABS: 1.0000 | ORAL_TABLET | Freq: Two times a day (BID) | ORAL | Status: DC

## 2017-02-08 MED ORDER — AMOXICILLIN-POT CLAVULANATE 875-125 MG PO TABS: 1.0000 | ORAL_TABLET | Freq: Two times a day (BID) | ORAL | 0 refills | Status: DC

## 2017-02-08 NOTE — Progress Notes (Signed)
Patient is alert and oriented and able to verbalize needs. No complaints of pain at this time. PIVs removed. VSS. Family at bedside. Discharge instructions gone over with patient at this time. Printed AVD and rx for Augmentin given to patient. Follow up appts made for patient. Patient verbalizes understanding of all discharge instructions and follow up care. No concerns voiced at this time. Family will transport patient home.  Suzan Slick, RN

## 2017-02-08 NOTE — Discharge Summary (Signed)
[facility] [place] [facility]   PATIENT NAME: Cheryl Rios    MR#:  [id number]  DATE OF BIRTH:  03-15-1926  DATE OF ADMISSION:  02/04/2017 ADMITTING PHYSICIAN: Marguarite Arbour, MD  DATE OF DISCHARGE: 02/08/2017 12:46 PM  PRIMARY CARE PHYSICIAN: Jerl Mina, MD    ADMISSION DIAGNOSIS:  Fatigue, unspecified type [R53.83]  DISCHARGE DIAGNOSIS:  Principal Problem:   Fever Active Problems:   Weakness   Failure to thrive in adult   OA (osteoarthritis)   Sepsis (HCC)   Choledocholithiasis   SECONDARY DIAGNOSIS:   Past Medical History:  Diagnosis Date  . Arrhythmia   . Arthritis   . Diabetes mellitus without complication (HCC)   . Diverticulosis   . Dysrhythmia   . Edema of both feet   . HOH (hard of hearing)   . Hypertension   . Hypothyroidism   . Neuropathy   . Osteoporosis   . Sleep apnea    OSA--Use C-PAP  . Thyroid disease     HOSPITAL COURSE:   1.  Escherichia coli sepsis. The patient was initially placed on antibiotics and switched over to meropenem Once this came back in the blood culture report, we did not know where the infection was coming from initially. The urine analysis was -2 chest x-rays were negative. The patient did have elevated liver function test. An ultrasound of the liver was done that showed dilated ducts. MRCP of the abdomen showed a stone in the common bile duct. Dr. Servando Snare gastroenterology was contacted for ERCP which was done on 02/07/2017. Please see procedure report. Sphincterotomy and stone removal was accomplished. Patient tolerated the procedure well and was feeling well the day after. Patient was discharged home on Augmentin as per infectious disease for a total of 10 days from admission. 2. Generalized weakness physical therapy recommended home health physical therapy and this was set up 3. Elevated liver function tests secondary to choledocholithiasis. 4. Essential hypertension on hydralazine, lisinopril and  metoprolol 5. Hypothyroidism unspecified on Synthroid 6. Obstructive sleep apnea on CPAP  DISCHARGE CONDITIONS:   Satisfactory  CONSULTS OBTAINED:  Dr. Servando Snare gastroenterology Dr. Sampson Goon infectious disease  DRUG ALLERGIES:  No Known Allergies  DISCHARGE MEDICATIONS:   Discharge Medication List as of 02/08/2017 11:29 AM    START taking these medications   Details  amoxicillin-clavulanate (AUGMENTIN) 875-125 MG tablet Take 1 tablet by mouth every 12 (twelve) hours., Starting Wed 02/08/2017, Print      CONTINUE these medications which have NOT CHANGED   Details  Ascorbic Acid (VITAMIN C) 100 MG tablet Take 500 mg by mouth daily. , Historical Med    aspirin 81 MG tablet Take 81 mg by mouth daily. In am., Historical Med    Cholecalciferol (VITAMIN D-3 PO) Take 1,000 mg by mouth., Until Discontinued, Historical Med    Cinnamon 500 MG capsule Take 1,000 mg by mouth 2 (two) times daily. , Historical Med    erythromycin ophthalmic ointment 1 application at bedtime as needed., Historical Med    fish oil-omega-3 fatty acids 1000 MG capsule Take 1,200 mg by mouth daily., Until Discontinued, Historical Med    furosemide (LASIX) 40 MG tablet Take 40 mg by mouth daily. In am., Historical Med    gabapentin (NEURONTIN) 300 MG capsule TAKE 2 CAPSULES BY MOUTH EVERY MORNING TAKE ONE CAPSULE AT LUNCH AND TAKE 2 CAPSULES AT BEDTIME, Normal    hydrALAZINE (APRESOLINE) 50 MG tablet Take 50 mg by mouth daily. In am., Historical Med  ketoconazole (NIZORAL) 2 % cream Apply 1 application topically daily., Starting 07/17/2014, Until Discontinued, Normal    levothyroxine (SYNTHROID, LEVOTHROID) 75 MCG tablet Take 75 mcg by mouth daily before breakfast., Until Discontinued, Historical Med    lisinopril (PRINIVIL,ZESTRIL) 20 MG tablet Take 20 mg by mouth daily. In am., Historical Med    metoprolol succinate (TOPROL-XL) 25 MG 24 hr tablet Take 25 mg by mouth daily. , Starting Tue 07/02/2013,  Historical Med    Multiple Vitamins-Minerals (WOMENS 50+ MULTI VITAMIN/MIN PO) Take 1 tablet by mouth daily. , Historical Med    potassium chloride (KLOR-CON 10) 10 MEQ tablet Take 10 mEq by mouth daily. In am., Historical Med    vitamin E (VITAMIN E) 400 UNIT capsule Take 400 Units by mouth daily., Until Discontinued, Historical Med      STOP taking these medications     metoprolol tartrate (LOPRESSOR) 25 MG tablet          DISCHARGE INSTRUCTIONS:   Follow-up PMD one week Follow-up-enterology 3 weeks  If you experience worsening of your admission symptoms, develop shortness of breath, life threatening emergency, suicidal or homicidal thoughts you must seek medical attention immediately by calling 911 or calling your MD immediately  if symptoms less severe.  You Must read complete instructions/literature along with all the possible adverse reactions/side effects for all the Medicines you take and that have been prescribed to you. Take any new Medicines after you have completely understood and accept all the possible adverse reactions/side effects.   Please note  You were cared for by a hospitalist during your hospital stay. If you have any questions about your discharge medications or the care you received while you were in the hospital after you are discharged, you can call the unit and asked to speak with the hospitalist on call if the hospitalist that took care of you is not available. Once you are discharged, your primary care physician will handle any further medical issues. Please note that NO REFILLS for any discharge medications will be authorized once you are discharged, as it is imperative that you return to your primary care physician (or establish a relationship with a primary care physician if you do not have one) for your aftercare needs so that they can reassess your need for medications and monitor your lab values.    Today   CHIEF COMPLAINT:   Chief Complaint   Patient presents with  . Fatigue    HISTORY OF PRESENT ILLNESS:  Cheryl Rios  is a 83 y.o. female presented with fatigue and found to have Escherichia coli sepsis   VITAL SIGNS:  Blood pressure (!) 141/74, pulse 68, temperature 98.1 F (36.7 C), temperature source Oral, resp. rate 14, height  (1.575 m), weight 90.3 kg (199 lb), SpO2 95 %.    PHYSICAL EXAMINATION:  GENERAL:  83 y.o.-year-old patient lying in the bed with no acute distress.  EYES: Pupils equal, round, reactive to light and accommodation. No scleral icterus. Extraocular muscles intact.  HEENT: Head atraumatic, normocephalic. Oropharynx and nasopharynx clear.  NECK:  Supple, no jugular venous distention. No thyroid enlargement, no tenderness.  LUNGS: Normal breath sounds bilaterally, no wheezing, rales,rhonchi or crepitation. No use of accessory muscles of respiration.  CARDIOVASCULAR: S1, S2 normal. No murmurs, rubs, or gallops.  ABDOMEN: Soft, non-tender, non-distended. Bowel sounds present. No organomegaly or mass.  EXTREMITIES: No pedal edema, cyanosis, or clubbing.  NEUROLOGIC: Cranial nerves II through XII are intact. Muscle strength 5/5 in all extremities.  Sensation intact. Gait not checked.  PSYCHIATRIC: The patient is alert and oriented x 3.  SKIN: No obvious rash, lesion, or ulcer.   DATA REVIEW:   CBC  Recent Labs Lab 02/06/17 0323  WBC 6.5  HGB 11.5*  HCT 32.6*  PLT 181    Chemistries   Recent Labs Lab 02/08/17 0346  NA 142  K 3.8  CL 110  CO2 26  GLUCOSE 95  BUN 16  CREATININE 0.78  CALCIUM 8.6*  AST 54*  ALT 71*  ALKPHOS 116  BILITOT 1.2    Cardiac Enzymes  Recent Labs Lab 02/04/17 1348  TROPONINI 0.03*    Microbiology Results  Results for orders placed or performed during the hospital encounter of 02/04/17  Urine culture     Status: Abnormal   Collection Time: 02/04/17 11:45 AM  Result Value Ref Range Status   Specimen Description URINE, RANDOM  Final    Special Requests NONE  Final   Culture MULTIPLE SPECIES PRESENT, SUGGEST RECOLLECTION (A)  Final   Report Status 02/06/2017 FINAL  Final  Blood culture (routine x 2)     Status: None (Preliminary result)   Collection Time: 02/04/17  6:37 PM  Result Value Ref Range Status   Specimen Description BLOOD BLOOD LEFT ARM  Final   Special Requests   Final    BOTTLES DRAWN AEROBIC AND ANAEROBIC Blood Culture adequate volume   Culture NO GROWTH 4 DAYS  Final   Report Status PENDING  Incomplete  Blood culture (routine x 2)     Status: Abnormal   Collection Time: 02/04/17  6:37 PM  Result Value Ref Range Status   Specimen Description BLOOD LEFT ANTECUBITAL  Final   Special Requests   Final    BOTTLES DRAWN AEROBIC AND ANAEROBIC Blood Culture adequate volume   Culture  Setup Time   Final    GRAM NEGATIVE RODS ANAEROBIC BOTTLE ONLY CRITICAL RESULT CALLED TO, READ BACK BY AND VERIFIED WITH: Bari Mantis @ 1053 02/05/17 by [facility]    Culture ESCHERICHIA COLI (A)  Final   Report Status 02/07/2017 FINAL  Final   Organism ID, Bacteria ESCHERICHIA COLI  Final      Susceptibility   Escherichia coli - MIC*    AMPICILLIN 8 SENSITIVE Sensitive     CEFAZOLIN <=4 SENSITIVE Sensitive     CEFEPIME <=1 SENSITIVE Sensitive     CEFTAZIDIME <=1 SENSITIVE Sensitive     CEFTRIAXONE <=1 SENSITIVE Sensitive     CIPROFLOXACIN <=0.25 SENSITIVE Sensitive     GENTAMICIN <=1 SENSITIVE Sensitive     IMIPENEM <=0.25 SENSITIVE Sensitive     TRIMETH/SULFA <=20 SENSITIVE Sensitive     AMPICILLIN/SULBACTAM 4 SENSITIVE Sensitive     PIP/TAZO <=4 SENSITIVE Sensitive     Extended ESBL NEGATIVE Sensitive     * ESCHERICHIA COLI  Blood Culture ID Panel (Reflexed)     Status: Abnormal   Collection Time: 02/04/17  6:37 PM  Result Value Ref Range Status   Enterococcus species NOT DETECTED NOT DETECTED Final   Listeria monocytogenes NOT DETECTED NOT DETECTED Final   Staphylococcus species NOT DETECTED NOT DETECTED Final    Staphylococcus aureus NOT DETECTED NOT DETECTED Final   Streptococcus species NOT DETECTED NOT DETECTED Final   Streptococcus agalactiae NOT DETECTED NOT DETECTED Final   Streptococcus pneumoniae NOT DETECTED NOT DETECTED Final   Streptococcus pyogenes NOT DETECTED NOT DETECTED Final   Acinetobacter baumannii NOT DETECTED NOT DETECTED Final   Enterobacteriaceae species DETECTED (A)  NOT DETECTED Final    Comment: Enterobacteriaceae represent a large family of gram-negative bacteria, not a single organism. CRITICAL RESULT CALLED TO, READ BACK BY AND VERIFIED WITH: Bari Mantis @ 1610 02/05/17 by [facility]    Enterobacter cloacae complex NOT DETECTED NOT DETECTED Final   Escherichia coli DETECTED (A) NOT DETECTED Final    Comment: CRITICAL RESULT CALLED TO, READ BACK BY AND VERIFIED WITH: Bari Mantis @ 9604 02/05/17 by [facility]    Klebsiella oxytoca NOT DETECTED NOT DETECTED Final   Klebsiella pneumoniae NOT DETECTED NOT DETECTED Final   Proteus species NOT DETECTED NOT DETECTED Final   Serratia marcescens NOT DETECTED NOT DETECTED Final   Carbapenem resistance NOT DETECTED NOT DETECTED Final   Haemophilus influenzae NOT DETECTED NOT DETECTED Final   Neisseria meningitidis NOT DETECTED NOT DETECTED Final   Pseudomonas aeruginosa NOT DETECTED NOT DETECTED Final   Candida albicans NOT DETECTED NOT DETECTED Final   Candida glabrata NOT DETECTED NOT DETECTED Final   Candida krusei NOT DETECTED NOT DETECTED Final   Candida parapsilosis NOT DETECTED NOT DETECTED Final   Candida tropicalis NOT DETECTED NOT DETECTED Final  MRSA PCR Screening     Status: None   Collection Time: 02/05/17  8:48 AM  Result Value Ref Range Status   MRSA by PCR NEGATIVE NEGATIVE Final    Comment:        The GeneXpert MRSA Assay (FDA approved for NASAL specimens only), is one component of a comprehensive MRSA colonization surveillance program. It is not intended to diagnose MRSA infection nor to guide or monitor  treatment for MRSA infections.     RADIOLOGY:  Mr 3d Recon At Scanner  Result Date: 02/07/2017 CLINICAL DATA:  Weakness and fever. Elevated liver is steatosis. Ultrasound showed intrahepatic biliary dilatation. EXAM: MRI ABDOMEN WITHOUT AND WITH CONTRAST (INCLUDING MRCP) TECHNIQUE: Multiplanar multisequence MR imaging of the abdomen was performed both before and after the administration of intravenous contrast. Heavily T2-weighted images of the biliary and pancreatic ducts were obtained, and three-dimensional MRCP images were rendered by post processing. CONTRAST:  18mL MULTIHANCE GADOBENATE DIMEGLUMINE 529 MG/ML IV SOLN COMPARISON:  ultrasound exam dated 02/05/2017 FINDINGS: Despite efforts by the technologist and patient, motion artifact is present on today's exam and could not be eliminated. This reduces exam sensitivity and specificity. Lower chest: Mild cardiomegaly. Hepatobiliary: Mild intrahepatic biliary dilatation. Common hepatic duct 1.3 cm diameter. Common bile duct 1.0 cm in diameter. On image 9/15 we demonstrate a 1.0 cm filling defect compatible with stone in the common bile duct. In addition, there is some truncation of the distal CBD in the vicinity of the ampulla, and a smaller ampullary stone cannot be totally excluded. Questionable filling defect in the slightly dilated cystic duct remnant on image 19/12. Pancreas: There several small dilated side ducts in the pancreatic tail. In the upper margin of the pancreatic body, a 1.7 by 1.0 by 1.1 cm slightly multilobulated cystic lesion is present on image 17/7, likely with an internal septation no obvious abnormal enhancement of this lesion although the post-contrast images are adversely affected by motion artifact. The there is a peripancreatic lymph node just above this lesion measuring 9 mm in short axis diameter on image 13/15 slightly complicating the assessment due to the degree of motion artifact. Connectivity of this cystic lesion with  the dorsal pancreatic duct is possible. A second cystic lesion along the uncinate process of the pancreas measures 1.3 by 0.7 by 0.7 cm, image 10/15, no obvious enhancement. Branching  fluid signal intensity dilated structure in the head of the pancreas measures 3.8 by 2.3 cm on images 11-12 12 through 13 series 15, for the most part these seen to not have significant enhancement. Spleen:  Unremarkable Adrenals/Urinary Tract: Right kidney lower pole Bosniak category 1 cyst anteriorly. Adrenal glands unremarkable. Stomach/Bowel: 3.5 cm diverticulum of the distal transverse duodenum. Vascular/Lymphatic: Aortoiliac atherosclerotic vascular disease. Borderline enlarged portacaval lymph node at 1.5 cm in short axis. Other:  No supplemental non-categorized findings. Musculoskeletal: Lumbar spondylosis and degenerative disc disease. IMPRESSION: 1. Accuracy of this test is severely adversely affected by motion artifact. MRCP is best suited to patients who can hold their breath and remain relatively motionless during MRI. 2. 1.0 cm stone in the common bile duct. The CBD diameter is 1.0 cm and the stone itself is about 1.0 cm. Mild intrahepatic biliary dilatation. Difficult to totally exclude a tiny stone in the vicinity of the ampulla. 3. Various cystic lesions of the pancreas are present. A larger branching lesion which could represent dilated portions of the ventral pancreatic duct is noted in the head of the pancreas, 3.8 by 2.3 cm. Smaller lesions are present along the upper margin of the pancreatic body (1.7 by 1.0 cm) and along the uncinate process (1.3 by 0.7 cm). None of these appreciably enhance although the degree of motion artifact makes it worse small areas of abnormal enhancement or abnormal hypoenhancement might well be obscured. The most appropriate next workup is probably pancreatic protocol CT with and without contrast given the limitations of today's MRI by motion artifact, to further characterize these  lesions (CT is less susceptible to motion artifact given the faster acquisition). In particular these could be sequela of prior pancreatitis or entities such as intraductal papillary mucinous neoplasm, but given the degree of potential dilatation in the ventral pancreatic ductal system, CT examination to exclude an occult small solid mass in the pancreatic head is recommended. 4. Mild cardiomegaly. 5.  Aortic Atherosclerosis (ICD10-I70.0). 6. Borderline enlarged portacaval lymph node. 7. Lumbar spondylosis and degenerative disc disease. Electronically Signed   By: Gaylyn Rong M.D.   On: 02/07/2017 08:17   Dg C-arm 1-60 Min-no Report  Result Date: 02/07/2017 Fluoroscopy was utilized by the requesting physician.  No radiographic interpretation.   Mr Abdomen Mrcp Vivien Rossetti Contast  Result Date: 02/07/2017 CLINICAL DATA:  Weakness and fever. Elevated liver is steatosis. Ultrasound showed intrahepatic biliary dilatation. EXAM: MRI ABDOMEN WITHOUT AND WITH CONTRAST (INCLUDING MRCP) TECHNIQUE: Multiplanar multisequence MR imaging of the abdomen was performed both before and after the administration of intravenous contrast. Heavily T2-weighted images of the biliary and pancreatic ducts were obtained, and three-dimensional MRCP images were rendered by post processing. CONTRAST:  18mL MULTIHANCE GADOBENATE DIMEGLUMINE 529 MG/ML IV SOLN COMPARISON:  ultrasound exam dated 02/05/2017 FINDINGS: Despite efforts by the technologist and patient, motion artifact is present on today's exam and could not be eliminated. This reduces exam sensitivity and specificity. Lower chest: Mild cardiomegaly. Hepatobiliary: Mild intrahepatic biliary dilatation. Common hepatic duct 1.3 cm diameter. Common bile duct 1.0 cm in diameter. On image 9/15 we demonstrate a 1.0 cm filling defect compatible with stone in the common bile duct. In addition, there is some truncation of the distal CBD in the vicinity of the ampulla, and a smaller  ampullary stone cannot be totally excluded. Questionable filling defect in the slightly dilated cystic duct remnant on image 19/12. Pancreas: There several small dilated side ducts in the pancreatic tail. In the upper margin of  the pancreatic body, a 1.7 by 1.0 by 1.1 cm slightly multilobulated cystic lesion is present on image 17/7, likely with an internal septation no obvious abnormal enhancement of this lesion although the post-contrast images are adversely affected by motion artifact. The there is a peripancreatic lymph node just above this lesion measuring 9 mm in short axis diameter on image 13/15 slightly complicating the assessment due to the degree of motion artifact. Connectivity of this cystic lesion with the dorsal pancreatic duct is possible. A second cystic lesion along the uncinate process of the pancreas measures 1.3 by 0.7 by 0.7 cm, image 10/15, no obvious enhancement. Branching fluid signal intensity dilated structure in the head of the pancreas measures 3.8 by 2.3 cm on images 11-12 12 through 13 series 15, for the most part these seen to not have significant enhancement. Spleen:  Unremarkable Adrenals/Urinary Tract: Right kidney lower pole Bosniak category 1 cyst anteriorly. Adrenal glands unremarkable. Stomach/Bowel: 3.5 cm diverticulum of the distal transverse duodenum. Vascular/Lymphatic: Aortoiliac atherosclerotic vascular disease. Borderline enlarged portacaval lymph node at 1.5 cm in short axis. Other:  No supplemental non-categorized findings. Musculoskeletal: Lumbar spondylosis and degenerative disc disease. IMPRESSION: 1. Accuracy of this test is severely adversely affected by motion artifact. MRCP is best suited to patients who can hold their breath and remain relatively motionless during MRI. 2. 1.0 cm stone in the common bile duct. The CBD diameter is 1.0 cm and the stone itself is about 1.0 cm. Mild intrahepatic biliary dilatation. Difficult to totally exclude a tiny stone in the  vicinity of the ampulla. 3. Various cystic lesions of the pancreas are present. A larger branching lesion which could represent dilated portions of the ventral pancreatic duct is noted in the head of the pancreas, 3.8 by 2.3 cm. Smaller lesions are present along the upper margin of the pancreatic body (1.7 by 1.0 cm) and along the uncinate process (1.3 by 0.7 cm). None of these appreciably enhance although the degree of motion artifact makes it worse small areas of abnormal enhancement or abnormal hypoenhancement might well be obscured. The most appropriate next workup is probably pancreatic protocol CT with and without contrast given the limitations of today's MRI by motion artifact, to further characterize these lesions (CT is less susceptible to motion artifact given the faster acquisition). In particular these could be sequela of prior pancreatitis or entities such as intraductal papillary mucinous neoplasm, but given the degree of potential dilatation in the ventral pancreatic ductal system, CT examination to exclude an occult small solid mass in the pancreatic head is recommended. 4. Mild cardiomegaly. 5.  Aortic Atherosclerosis (ICD10-I70.0). 6. Borderline enlarged portacaval lymph node. 7. Lumbar spondylosis and degenerative disc disease. Electronically Signed   By: Gaylyn Rong M.D.   On: 02/07/2017 08:17     Management plans discussed with the patient, family and they are in agreement.  CODE STATUS:  Code Status History    Date Active Date Inactive Code Status Order ID Comments User Context   02/04/2017  9:04 PM 02/08/2017  3:51 PM Full Code [id number]  Marguarite Arbour, MD Inpatient      TOTAL TIME TAKING CARE OF THIS PATIENT: 35 minutes.    Alford Highland M.D on 02/08/2017 at 4:01 PM  Between 7am to 6pm - Pager - [phone]  After 6pm go to [email or web address] - password [email or web address]  [employer] [facility] Office  [phone]  CC: Primary care physician; Jerl Mina, MD

## 2017-02-09 LAB — CULTURE, BLOOD (ROUTINE X 2)
Culture: NO GROWTH
SPECIAL REQUESTS: ADEQUATE

## 2017-02-13 ENCOUNTER — Ambulatory Visit (INDEPENDENT_AMBULATORY_CARE_PROVIDER_SITE_OTHER): Payer: MEDICARE | Admitting: Podiatry

## 2017-02-13 DIAGNOSIS — E1149 Type 2 diabetes mellitus with other diabetic neurological complication: Secondary | ICD-10-CM

## 2017-02-13 DIAGNOSIS — M79676 Pain in unspecified toe(s): Secondary | ICD-10-CM | POA: Diagnosis not present

## 2017-02-13 DIAGNOSIS — B351 Tinea unguium: Secondary | ICD-10-CM

## 2017-02-13 DIAGNOSIS — Q828 Other specified congenital malformations of skin: Secondary | ICD-10-CM

## 2017-02-13 NOTE — Progress Notes (Signed)
Complaint:  Visit Type: Patient returns to my office for continued preventative foot care services. Complaint: Patient states" my nails have grown long and thick and become painful to walk and wear shoes" Patient has been diagnosed with DM with neuropathy.. The patient presents for preventative foot care services. No changes to ROS  Podiatric Exam: Vascular: dorsalis pedis and posterior tibial pulses are palpable bilateral. Capillary return is immediate. Temperature gradient is WNL. Skin turgor WNL  Sensorium: Normal Semmes Weinstein monofilament test. Normal tactile sensation bilaterally. Nail Exam: Pt has thick disfigured discolored nails with subungual debris noted bilateral entire nail hallux through fifth toenails Ulcer Exam: There is no evidence of ulcer or pre-ulcerative changes or infection. Orthopedic Exam: Muscle tone and strength are WNL. No limitations in general ROM. No crepitus or effusions noted. Foot type and digits show no abnormalities. Bony prominences are unremarkable. Swelling feet  B/L Skin:  Porokeratosis sub 4th bilateral..  Diffuse plantar tyloma.. No infection or ulcers  Diagnosis:  Onychomycosis, , Pain in right toe, pain in left toes. Porokeratosis right foot  Treatment & Plan Procedures and Treatment: Consent by patient was obtained for treatment procedures. The patient understood the discussion of treatment and procedures well. All questions were answered thoroughly reviewed. Debridement of mycotic and hypertrophic toenails, 1 through 5 bilateral and clearing of subungual debris. No ulceration, no infection noted. Debride porokeratosis.  Return Visit-Office Procedure: Patient instructed to return to the office for a follow up visit 3 months for continued evaluation and treatment.    Helane Gunther DPM

## 2017-03-06 ENCOUNTER — Ambulatory Visit (INDEPENDENT_AMBULATORY_CARE_PROVIDER_SITE_OTHER): Payer: MEDICARE | Admitting: Gastroenterology

## 2017-03-06 ENCOUNTER — Encounter (INDEPENDENT_AMBULATORY_CARE_PROVIDER_SITE_OTHER): Payer: Self-pay

## 2017-03-06 ENCOUNTER — Encounter: Payer: Self-pay | Admitting: Gastroenterology

## 2017-03-06 VITALS — BP 150/69 | HR 86 | Temp 98.1°F | Ht 62.0 in | Wt 195.0 lb

## 2017-03-06 DIAGNOSIS — K8050 Calculus of bile duct without cholangitis or cholecystitis without obstruction: Secondary | ICD-10-CM

## 2017-03-06 NOTE — Progress Notes (Signed)
Primary Care Physician: Jerl Mina, MD  Primary Gastroenterologist:  Dr. Midge Minium  No chief complaint on file.   HPI: Cheryl Rios is a 83 y.o. female here for follow-up after being in the hospital with a common bile duct stone found during an ERCP. The patient had a stone removed and her bilirubin came back to normal.  The patient's hospital course was uneventful and the patient was discharged without any events. The patient is now here for follow-up after being discharged from the hospital. The patient has had no sign of any further jaundice or abdominal pain.  She states that she had labs done at her primary care provider's office and she denies any dark urine or light stools.  Current Outpatient Prescriptions  Medication Sig Dispense Refill  . amoxicillin-clavulanate (AUGMENTIN) 875-125 MG tablet Take 1 tablet by mouth every 12 (twelve) hours. 11 tablet 0  . Ascorbic Acid (VITAMIN C) 100 MG tablet Take 500 mg by mouth daily.     Marland Kitchen aspirin EC 81 MG tablet Take by mouth.    . Cholecalciferol (VITAMIN D3) 1000 units CAPS Take by mouth.    . Cinnamon 500 MG capsule Take 1,000 mg by mouth 2 (two) times daily.     Marland Kitchen erythromycin ophthalmic ointment 1 application at bedtime as needed.    . fish oil-omega-3 fatty acids 1000 MG capsule Take 1,200 mg by mouth daily.    . furosemide (LASIX) 40 MG tablet Take 40 mg by mouth daily. In am.    . gabapentin (NEURONTIN) 300 MG capsule TAKE 2 CAPSULES BY MOUTH EVERY MORNING TAKE ONE CAPSULE AT LUNCH AND TAKE 2 CAPSULES AT BEDTIME 450 capsule 3  . hydrALAZINE (APRESOLINE) 50 MG tablet Take 50 mg by mouth daily. In am.    . ketoconazole (NIZORAL) 2 % cream Apply 1 application topically daily. 60 g 2  . levothyroxine (SYNTHROID, LEVOTHROID) 75 MCG tablet TAKE 1 TABLET BY MOUTH ONCE DAILY ON EMPTY STOMACH WITH WATER 30-60 MIN BEFORE BREAKFAST    . lisinopril (PRINIVIL,ZESTRIL) 20 MG tablet TAKE 1 TABLET (20 MG TOTAL) BY MOUTH ONCE DAILY.    .  meloxicam (MOBIC) 7.5 MG tablet     . metoprolol succinate (TOPROL-XL) 25 MG 24 hr tablet TAKE 1 TABLET BY MOUTH EVERY DAY    . Multiple Vitamins-Minerals (WOMENS 50+ MULTI VITAMIN/MIN PO) Take 1 tablet by mouth daily.     . mupirocin ointment (BACTROBAN) 2 % APPLY TO AFFECTED AREA ONCE DAILY FOR 7 DAYS  0  . nystatin cream (MYCOSTATIN) APPLY TOPICALLY 2 (TWO) TIMES DAILY.  2  . potassium chloride (KLOR-CON 10) 10 MEQ tablet TAKE 1 TABLET BY MOUTH ONCE A DAY    . vitamin C (ASCORBIC ACID) 500 MG tablet Take by mouth.    . vitamin E (VITAMIN E) 400 UNIT capsule Take 400 Units by mouth daily.    . vitamin E 400 UNIT capsule Take by mouth.     No current facility-administered medications for this visit.     Allergies as of 03/06/2017  . (No Known Allergies)    ROS:  General: Negative for anorexia, weight loss, fever, chills, fatigue, weakness. ENT: Negative for hoarseness, difficulty swallowing , nasal congestion. CV: Negative for chest pain, angina, palpitations, dyspnea on exertion, peripheral edema.  Respiratory: Negative for dyspnea at rest, dyspnea on exertion, cough, sputum, wheezing.  GI: See history of present illness. GU:  Negative for dysuria, hematuria, urinary incontinence, urinary frequency, nocturnal urination.  Endo: Negative for unusual weight change.    Physical Examination:   There were no vitals taken for this visit.  General: Well-nourished, well-developed in no acute distress.  Eyes: No icterus. Conjunctivae pink. Mouth: Oropharyngeal mucosa moist and pink , no lesions erythema or exudate. Lungs: Clear to auscultation bilaterally. Non-labored. Heart: Regular rate and rhythm, no murmurs rubs or gallops.  Abdomen: Bowel sounds are normal, nontender, nondistended, no hepatosplenomegaly or masses, no abdominal bruits or hernia , no rebound or guarding.   Extremities: No lower extremity edema. No clubbing or deformities. Neuro: Alert and oriented x 3.  Grossly  intact. Skin: Warm and dry, no jaundice.   Psych: Alert and cooperative, normal mood and affect.  Labs:    Imaging Studies: Ct Head Wo Contrast  Result Date: 02/04/2017 CLINICAL DATA:  Generalized weakness.  Unable to walk. EXAM: CT HEAD WITHOUT CONTRAST TECHNIQUE: Contiguous axial images were obtained from the base of the skull through the vertex without intravenous contrast. COMPARISON:  None. FINDINGS: Brain: No evidence of acute infarction, hemorrhage, hydrocephalus, extra-axial collection or mass lesion/mass effect. Age congruent cerebral volume loss. Age normal white matter appearance. Vascular: No hyperdense vessel or unexpected calcification. Skull: Normal. Negative for fracture or focal lesion. Sinuses/Orbits: No acute finding.  Bilateral cataract resection. IMPRESSION: Age normal head CT. Electronically Signed   By: Marnee Spring M.D.   On: 02/04/2017 17:22   Ct Chest Wo Contrast  Result Date: 02/04/2017 CLINICAL DATA:  Dyspnea.  Chronic.  Indeterminate chest radiograph. EXAM: CT CHEST WITHOUT CONTRAST TECHNIQUE: Multidetector CT imaging of the chest was performed following the standard protocol without IV contrast. COMPARISON:  Chest radiograph earlier today.  No prior CT. FINDINGS: Cardiovascular: Aortic and branch vessel atherosclerosis. Tortuous thoracic aorta. Moderate cardiomegaly, without pericardial effusion. Multivessel coronary artery atherosclerosis. Mediastinum/Nodes: No mediastinal or definite hilar adenopathy, given limitations of unenhanced CT. Lungs/Pleura: No pleural fluid. Right upper lobe 4 mm pulmonary nodule on image 33/ series 3. More inferior right upper lobe pulmonary nodule of 4 mm on image 43/ series 3. Right middle lobe scarring or subsegmental atelectasis 3 mm right lower lobe nodule on image 108/series 3. A subpleural right lower lobe 3 mm nodule on image 93/ series 3. Scattered left upper lobe pulmonary nodules of 4 mm or less. A 5 mm left lower lobe pulmonary  nodule on image 104/ series 3. Calcified granuloma in the left lower lobe. Subpleural left lower lobe 5 mm nodule on image 34/series 3. Mild subsegmental atelectasis or scar at the left lung base. No evidence of pneumonia. Upper Abdomen: Mild hepatic steatosis. Variant lateral segment left liver lobe extending in the left upper quadrant. Normal imaged portions of the spleen, stomach, pancreas, kidneys. Mild adrenal thickening bilaterally. Mild porta hepatis adenopathy including at 1.7 cm on image 142/series 2. Musculoskeletal: No acute osseous abnormality. IMPRESSION: 1. No evidence of pneumonia. 2. Scattered pulmonary nodules of maximally 5 mm. No follow-up needed if patient is low-risk. Non-contrast chest CT can be considered in 12 months if patient is high-risk. This recommendation follows the consensus statement: Guidelines for Management of Incidental Pulmonary Nodules Detected on CT Images: From the Fleischner Society 2017; Radiology 2017; 284:228-243. 3. Coronary artery atherosclerosis. Aortic Atherosclerosis (ICD10-I70.0). 4. Hepatic steatosis. Mild porta hepatis adenopathy could be reactive and related to steatosis. Correlate with abdominal complaints. Electronically Signed   By: Jeronimo Greaves M.D.   On: 02/04/2017 16:08   US Abdomen Complete  Result Date: 02/05/2017 CLINICAL DATA:  Abnormal LFTs EXAM: ABDOMEN  ULTRASOUND COMPLETE COMPARISON:  None. FINDINGS: Gallbladder: The gallbladder is surgically absent. Common bile duct: Diameter: 12.4 mm Liver: Intrahepatic ductal dilatation is identified. No focal mass. Portal vein is patent on color Doppler imaging with normal direction of blood flow towards the liver. IVC: No abnormality visualized. Pancreas: Visualized portion unremarkable. Spleen: Size and appearance within normal limits. Right Kidney: Length: 10.9 cm. There is a 19 mm cyst in the lower pole. Left Kidney: Length: 10.6 cm. Echogenicity within normal limits. No mass or hydronephrosis visualized.  Abdominal aorta: No aneurysm visualized. Other findings: None. IMPRESSION: 1. The common bile duct measures 12.4 mm. Central intrahepatic ductal dilatation. This is nonspecific after cholecystectomy. Recommend correlation with labs. If there is concern for biliary obstruction, recommend an MRCP. 2. No other acute abnormalities. Electronically Signed   By: Gerome Sam III M.D   On: 02/05/2017 12:38   Mr 3d Recon At Scanner  Result Date: 02/07/2017 CLINICAL DATA:  Weakness and fever. Elevated liver is steatosis. Ultrasound showed intrahepatic biliary dilatation. EXAM: MRI ABDOMEN WITHOUT AND WITH CONTRAST (INCLUDING MRCP) TECHNIQUE: Multiplanar multisequence MR imaging of the abdomen was performed both before and after the administration of intravenous contrast. Heavily T2-weighted images of the biliary and pancreatic ducts were obtained, and three-dimensional MRCP images were rendered by post processing. CONTRAST:  18mL MULTIHANCE GADOBENATE DIMEGLUMINE 529 MG/ML IV SOLN COMPARISON:  ultrasound exam dated 02/05/2017 FINDINGS: Despite efforts by the technologist and patient, motion artifact is present on today's exam and could not be eliminated. This reduces exam sensitivity and specificity. Lower chest: Mild cardiomegaly. Hepatobiliary: Mild intrahepatic biliary dilatation. Common hepatic duct 1.3 cm diameter. Common bile duct 1.0 cm in diameter. On image 9/15 we demonstrate a 1.0 cm filling defect compatible with stone in the common bile duct. In addition, there is some truncation of the distal CBD in the vicinity of the ampulla, and a smaller ampullary stone cannot be totally excluded. Questionable filling defect in the slightly dilated cystic duct remnant on image 19/12. Pancreas: There several small dilated side ducts in the pancreatic tail. In the upper margin of the pancreatic body, a 1.7 by 1.0 by 1.1 cm slightly multilobulated cystic lesion is present on image 17/7, likely with an internal septation  no obvious abnormal enhancement of this lesion although the post-contrast images are adversely affected by motion artifact. The there is a peripancreatic lymph node just above this lesion measuring 9 mm in short axis diameter on image 13/15 slightly complicating the assessment due to the degree of motion artifact. Connectivity of this cystic lesion with the dorsal pancreatic duct is possible. A second cystic lesion along the uncinate process of the pancreas measures 1.3 by 0.7 by 0.7 cm, image 10/15, no obvious enhancement. Branching fluid signal intensity dilated structure in the head of the pancreas measures 3.8 by 2.3 cm on images 11-12 12 through 13 series 15, for the most part these seen to not have significant enhancement. Spleen:  Unremarkable Adrenals/Urinary Tract: Right kidney lower pole Bosniak category 1 cyst anteriorly. Adrenal glands unremarkable. Stomach/Bowel: 3.5 cm diverticulum of the distal transverse duodenum. Vascular/Lymphatic: Aortoiliac atherosclerotic vascular disease. Borderline enlarged portacaval lymph node at 1.5 cm in short axis. Other:  No supplemental non-categorized findings. Musculoskeletal: Lumbar spondylosis and degenerative disc disease. IMPRESSION: 1. Accuracy of this test is severely adversely affected by motion artifact. MRCP is best suited to patients who can hold their breath and remain relatively motionless during MRI. 2. 1.0 cm stone in the common bile duct. The CBD  diameter is 1.0 cm and the stone itself is about 1.0 cm. Mild intrahepatic biliary dilatation. Difficult to totally exclude a tiny stone in the vicinity of the ampulla. 3. Various cystic lesions of the pancreas are present. A larger branching lesion which could represent dilated portions of the ventral pancreatic duct is noted in the head of the pancreas, 3.8 by 2.3 cm. Smaller lesions are present along the upper margin of the pancreatic body (1.7 by 1.0 cm) and along the uncinate process (1.3 by 0.7 cm). None  of these appreciably enhance although the degree of motion artifact makes it worse small areas of abnormal enhancement or abnormal hypoenhancement might well be obscured. The most appropriate next workup is probably pancreatic protocol CT with and without contrast given the limitations of today's MRI by motion artifact, to further characterize these lesions (CT is less susceptible to motion artifact given the faster acquisition). In particular these could be sequela of prior pancreatitis or entities such as intraductal papillary mucinous neoplasm, but given the degree of potential dilatation in the ventral pancreatic ductal system, CT examination to exclude an occult small solid mass in the pancreatic head is recommended. 4. Mild cardiomegaly. 5.  Aortic Atherosclerosis (ICD10-I70.0). 6. Borderline enlarged portacaval lymph node. 7. Lumbar spondylosis and degenerative disc disease. Electronically Signed   By: Gaylyn Rong M.D.   On: 02/07/2017 08:17   Dg Chest Port 1 View  Result Date: 02/06/2017 CLINICAL DATA:  Cough. EXAM: PORTABLE CHEST 1 VIEW COMPARISON:  Radiograph of February 04, 2017. FINDINGS: Stable cardiomegaly. Atherosclerosis of thoracic aorta is noted. No pneumothorax or pleural effusion is noted. Stable bibasilar scarring or atelectasis is noted. Bony thorax is unremarkable. IMPRESSION: Aortic atherosclerosis. Stable bibasilar scarring or atelectasis is noted. Electronically Signed   By: Lupita Raider, M.D.   On: 02/06/2017 12:22   Dg Chest Portable 1 View  Result Date: 02/04/2017 CLINICAL DATA:  83 year old female with weakness EXAM: PORTABLE CHEST 1 VIEW COMPARISON:  None. FINDINGS: Cardiomegaly identified. Bibasilar opacities are noted -question atelectasis, scarring or possibly airspace disease. No evidence of pleural effusion or pneumothorax. No acute bony abnormalities are present. IMPRESSION: Bibasilar opacities which may represent atelectasis, scarring or airspace disease.  Cardiomegaly. Electronically Signed   By: Harmon Pier M.D.   On: 02/04/2017 12:20   Dg C-arm 1-60 Min-no Report  Result Date: 02/07/2017 Fluoroscopy was utilized by the requesting physician.  No radiographic interpretation.   Mr Abdomen Mrcp Vivien Rossetti Contast  Result Date: 02/07/2017 CLINICAL DATA:  Weakness and fever. Elevated liver is steatosis. Ultrasound showed intrahepatic biliary dilatation. EXAM: MRI ABDOMEN WITHOUT AND WITH CONTRAST (INCLUDING MRCP) TECHNIQUE: Multiplanar multisequence MR imaging of the abdomen was performed both before and after the administration of intravenous contrast. Heavily T2-weighted images of the biliary and pancreatic ducts were obtained, and three-dimensional MRCP images were rendered by post processing. CONTRAST:  18mL MULTIHANCE GADOBENATE DIMEGLUMINE 529 MG/ML IV SOLN COMPARISON:  ultrasound exam dated 02/05/2017 FINDINGS: Despite efforts by the technologist and patient, motion artifact is present on today's exam and could not be eliminated. This reduces exam sensitivity and specificity. Lower chest: Mild cardiomegaly. Hepatobiliary: Mild intrahepatic biliary dilatation. Common hepatic duct 1.3 cm diameter. Common bile duct 1.0 cm in diameter. On image 9/15 we demonstrate a 1.0 cm filling defect compatible with stone in the common bile duct. In addition, there is some truncation of the distal CBD in the vicinity of the ampulla, and a smaller ampullary stone cannot be totally excluded. Questionable filling  defect in the slightly dilated cystic duct remnant on image 19/12. Pancreas: There several small dilated side ducts in the pancreatic tail. In the upper margin of the pancreatic body, a 1.7 by 1.0 by 1.1 cm slightly multilobulated cystic lesion is present on image 17/7, likely with an internal septation no obvious abnormal enhancement of this lesion although the post-contrast images are adversely affected by motion artifact. The there is a peripancreatic lymph node just  above this lesion measuring 9 mm in short axis diameter on image 13/15 slightly complicating the assessment due to the degree of motion artifact. Connectivity of this cystic lesion with the dorsal pancreatic duct is possible. A second cystic lesion along the uncinate process of the pancreas measures 1.3 by 0.7 by 0.7 cm, image 10/15, no obvious enhancement. Branching fluid signal intensity dilated structure in the head of the pancreas measures 3.8 by 2.3 cm on images 11-12 12 through 13 series 15, for the most part these seen to not have significant enhancement. Spleen:  Unremarkable Adrenals/Urinary Tract: Right kidney lower pole Bosniak category 1 cyst anteriorly. Adrenal glands unremarkable. Stomach/Bowel: 3.5 cm diverticulum of the distal transverse duodenum. Vascular/Lymphatic: Aortoiliac atherosclerotic vascular disease. Borderline enlarged portacaval lymph node at 1.5 cm in short axis. Other:  No supplemental non-categorized findings. Musculoskeletal: Lumbar spondylosis and degenerative disc disease. IMPRESSION: 1. Accuracy of this test is severely adversely affected by motion artifact. MRCP is best suited to patients who can hold their breath and remain relatively motionless during MRI. 2. 1.0 cm stone in the common bile duct. The CBD diameter is 1.0 cm and the stone itself is about 1.0 cm. Mild intrahepatic biliary dilatation. Difficult to totally exclude a tiny stone in the vicinity of the ampulla. 3. Various cystic lesions of the pancreas are present. A larger branching lesion which could represent dilated portions of the ventral pancreatic duct is noted in the head of the pancreas, 3.8 by 2.3 cm. Smaller lesions are present along the upper margin of the pancreatic body (1.7 by 1.0 cm) and along the uncinate process (1.3 by 0.7 cm). None of these appreciably enhance although the degree of motion artifact makes it worse small areas of abnormal enhancement or abnormal hypoenhancement might well be obscured.  The most appropriate next workup is probably pancreatic protocol CT with and without contrast given the limitations of today's MRI by motion artifact, to further characterize these lesions (CT is less susceptible to motion artifact given the faster acquisition). In particular these could be sequela of prior pancreatitis or entities such as intraductal papillary mucinous neoplasm, but given the degree of potential dilatation in the ventral pancreatic ductal system, CT examination to exclude an occult small solid mass in the pancreatic head is recommended. 4. Mild cardiomegaly. 5.  Aortic Atherosclerosis (ICD10-I70.0). 6. Borderline enlarged portacaval lymph node. 7. Lumbar spondylosis and degenerative disc disease. Electronically Signed   By: Gaylyn Rong M.D.   On: 02/07/2017 08:17    Assessment and Plan:   Cheryl Rios is a 83 y.o. y/o female who comes in for follow-up after having an ERCP in the hospital with a stone extraction. The patient's hospital course was uneventful and the patient reports that she is doing well. The patient has been told to contact me if any issues arise otherwise she should follow up as needed.    Midge Minium, MD. Clementeen Graham   Note: This dictation was prepared with Dragon dictation along with smaller phrase technology. Any transcriptional errors that result from this process  are unintentional.

## 2017-03-27 ENCOUNTER — Other Ambulatory Visit: Payer: Self-pay | Admitting: Family Medicine

## 2017-03-27 DIAGNOSIS — Z1231 Encounter for screening mammogram for malignant neoplasm of breast: Secondary | ICD-10-CM

## 2017-04-19 ENCOUNTER — Ambulatory Visit
Admission: RE | Admit: 2017-04-19 | Discharge: 2017-04-19 | Disposition: A | Discharge status: Home / Self Care | Payer: MEDICARE | Source: Ambulatory Visit | Attending: Family Medicine | Admitting: Family Medicine

## 2017-04-19 DIAGNOSIS — Z1231 Encounter for screening mammogram for malignant neoplasm of breast: Secondary | ICD-10-CM | POA: Insufficient documentation

## 2017-05-08 ENCOUNTER — Ambulatory Visit: Payer: MEDICARE | Admitting: Podiatry

## 2017-05-15 ENCOUNTER — Ambulatory Visit: Payer: MEDICARE | Admitting: Podiatry

## 2017-05-18 ENCOUNTER — Ambulatory Visit (INDEPENDENT_AMBULATORY_CARE_PROVIDER_SITE_OTHER): Payer: MEDICARE | Admitting: Podiatry

## 2017-05-18 ENCOUNTER — Encounter: Payer: Self-pay | Admitting: Podiatry

## 2017-05-18 DIAGNOSIS — B351 Tinea unguium: Secondary | ICD-10-CM | POA: Diagnosis not present

## 2017-05-18 DIAGNOSIS — E1149 Type 2 diabetes mellitus with other diabetic neurological complication: Secondary | ICD-10-CM

## 2017-05-18 DIAGNOSIS — M79676 Pain in unspecified toe(s): Secondary | ICD-10-CM

## 2017-05-18 DIAGNOSIS — Q828 Other specified congenital malformations of skin: Secondary | ICD-10-CM | POA: Diagnosis not present

## 2017-05-18 NOTE — Progress Notes (Addendum)
Complaint:  Visit Type: Patient returns to my office for continued preventative foot care services. Complaint: Patient states" my nails have grown long and thick and become painful to walk and wear shoes" Patient has been diagnosed with DM with neuropathy.. The patient presents for preventative foot care services. No changes to ROS  Podiatric Exam: Vascular: dorsalis pedis and posterior tibial pulses are palpable bilateral. Capillary return is immediate. Temperature gradient is WNL. Skin turgor WNL  Sensorium: Normal Semmes Weinstein monofilament test. Normal tactile sensation bilaterally. Nail Exam: Pt has thick disfigured discolored nails with subungual debris noted bilateral entire nail hallux through fifth toenails Ulcer Exam: There is no evidence of ulcer or pre-ulcerative changes or infection. Orthopedic Exam: Muscle tone and strength are WNL. No limitations in general ROM. No crepitus or effusions noted. Foot type and digits show no abnormalities. Bony prominences are unremarkable. Swelling feet  B/L Skin:  Porokeratosis sub 4th bilateral..  Diffuse plantar tyloma.. No infection or ulcers  Diagnosis:  Onychomycosis, , Pain in right toe, pain in left toes. Porokeratosis  B/L  Treatment & Plan Procedures and Treatment: Consent by patient was obtained for treatment procedures. The patient understood the discussion of treatment and procedures well. All questions were answered thoroughly reviewed. Debridement of mycotic and hypertrophic toenails, 1 through 5 bilateral and clearing of subungual debris. No ulceration, no infection noted. Debride porokeratosis. Padding dispensed.  Debride corn fifth toe right. Return Visit-Office Procedure: Patient instructed to return to the office for a follow up visit 3 months for continued evaluation and treatment.    Helane GuntherGregory  DPM

## 2017-08-17 ENCOUNTER — Encounter: Payer: Self-pay | Admitting: Podiatry

## 2017-08-17 ENCOUNTER — Ambulatory Visit (INDEPENDENT_AMBULATORY_CARE_PROVIDER_SITE_OTHER): Payer: MEDICARE | Admitting: Podiatry

## 2017-08-17 DIAGNOSIS — M79676 Pain in unspecified toe(s): Secondary | ICD-10-CM | POA: Diagnosis not present

## 2017-08-17 DIAGNOSIS — B351 Tinea unguium: Secondary | ICD-10-CM

## 2017-08-17 DIAGNOSIS — Q828 Other specified congenital malformations of skin: Secondary | ICD-10-CM

## 2017-08-17 NOTE — Progress Notes (Signed)
Complaint:  Visit Type: Patient returns to my office for continued preventative foot care services. Complaint: Patient states" my nails have grown long and thick and become painful to walk and wear shoes" Patient has been diagnosed with DM with neuropathy.. The patient presents for preventative foot care services. No changes to ROS  Podiatric Exam: Vascular: dorsalis pedis and posterior tibial pulses are palpable bilateral. Capillary return is immediate. Temperature gradient is WNL. Skin turgor WNL  Sensorium: Normal Semmes Weinstein monofilament test. Normal tactile sensation bilaterally. Nail Exam: Pt has thick disfigured discolored nails with subungual debris noted bilateral entire nail hallux through fifth toenails Ulcer Exam: There is no evidence of ulcer or pre-ulcerative changes or infection. Orthopedic Exam: Muscle tone and strength are WNL. No limitations in general ROM. No crepitus or effusions noted. Foot type and digits show no abnormalities. Bony prominences are unremarkable. Swelling feet  B/L Skin:  Porokeratosis sub 4th bilateral..  Diffuse plantar tyloma.. No infection or ulcers  Diagnosis:  Onychomycosis, , Pain in right toe, pain in left toes. Porokeratosis  B/L  Treatment & Plan Procedures and Treatment: Consent by patient was obtained for treatment procedures. The patient understood the discussion of treatment and procedures well. All questions were answered thoroughly reviewed. Debridement of mycotic and hypertrophic toenails, 1 through 5 bilateral and clearing of subungual debris. No ulceration, no infection noted. Debride porokeratosis.  Return Visit-Office Procedure: Patient instructed to return to the office for a follow up visit 3 months for continued evaluation and treatment.    Helane GuntherGregory  DPM

## 2017-11-08 DIAGNOSIS — R600 Localized edema: Secondary | ICD-10-CM | POA: Insufficient documentation

## 2017-11-20 ENCOUNTER — Ambulatory Visit (INDEPENDENT_AMBULATORY_CARE_PROVIDER_SITE_OTHER): Payer: MEDICARE | Admitting: Podiatry

## 2017-11-20 ENCOUNTER — Encounter: Payer: Self-pay | Admitting: Podiatry

## 2017-11-20 DIAGNOSIS — B351 Tinea unguium: Secondary | ICD-10-CM | POA: Diagnosis not present

## 2017-11-20 DIAGNOSIS — Q828 Other specified congenital malformations of skin: Secondary | ICD-10-CM | POA: Diagnosis not present

## 2017-11-20 DIAGNOSIS — E1149 Type 2 diabetes mellitus with other diabetic neurological complication: Secondary | ICD-10-CM | POA: Diagnosis not present

## 2017-11-20 DIAGNOSIS — M79676 Pain in unspecified toe(s): Secondary | ICD-10-CM | POA: Diagnosis not present

## 2017-11-20 NOTE — Progress Notes (Signed)
Complaint:  Visit Type: Patient returns to my office for continued preventative foot care services. Complaint: Patient states" my nails have grown long and thick and become painful to walk and wear shoes" Patient has been diagnosed with DM with neuropathy.. The patient presents for preventative foot care services. No changes to ROS  Podiatric Exam: Vascular: dorsalis pedis and posterior tibial pulses are palpable bilateral. Capillary return is immediate. Temperature gradient is WNL. Skin turgor WNL  Sensorium: Normal Semmes Weinstein monofilament test. Normal tactile sensation bilaterally. Nail Exam: Pt has thick disfigured discolored nails with subungual debris noted bilateral entire nail hallux through fifth toenails Ulcer Exam: There is no evidence of ulcer or pre-ulcerative changes or infection. Orthopedic Exam: Muscle tone and strength are WNL. No limitations in general ROM. No crepitus or effusions noted. Foot type and digits show no abnormalities. Bony prominences are unremarkable. Swelling feet  B/L Skin:  Porokeratosis sub 4th bilateral..  Diffuse plantar tyloma.. No infection or ulcers  Diagnosis:  Onychomycosis, , Pain in right toe, pain in left toes. Porokeratosis  B/L  Treatment & Plan Procedures and Treatment: Consent by patient was obtained for treatment procedures. The patient understood the discussion of treatment and procedures well. All questions were answered thoroughly reviewed. Debridement of mycotic and hypertrophic toenails, 1 through 5 bilateral and clearing of subungual debris. No ulceration, no infection noted. Debride porokeratosis.  Return Visit-Office Procedure: Patient instructed to return to the office for a follow up visit 3 months for continued evaluation and treatment.    Helane GuntherGregory  DPM

## 2017-12-12 ENCOUNTER — Other Ambulatory Visit: Payer: Self-pay | Admitting: Podiatry

## 2017-12-15 NOTE — Telephone Encounter (Signed)
Pt states her pharmacy called but has not received a response for the gabapentin refill. Pt states she had seen Dr. Al CorpusHyatt for the gabapentin, but see Dr. Stacie AcresMayer for the toenail trims. I told pt I saw that she had been keeping her regular appts for foot checks and nail trims and I would refill as ordered by Dr. Al CorpusHyatt [phone].

## 2018-03-05 ENCOUNTER — Ambulatory Visit: Payer: MEDICARE | Admitting: Podiatry

## 2018-03-12 ENCOUNTER — Encounter: Payer: Self-pay | Admitting: Occupational Therapy

## 2018-03-12 ENCOUNTER — Other Ambulatory Visit: Payer: Self-pay

## 2018-03-12 ENCOUNTER — Ambulatory Visit (INDEPENDENT_AMBULATORY_CARE_PROVIDER_SITE_OTHER): Payer: MEDICARE | Admitting: Podiatry

## 2018-03-12 ENCOUNTER — Encounter: Payer: Self-pay | Admitting: Podiatry

## 2018-03-12 ENCOUNTER — Ambulatory Visit: Payer: MEDICARE | Attending: Rheumatology | Admitting: Occupational Therapy

## 2018-03-12 DIAGNOSIS — E1149 Type 2 diabetes mellitus with other diabetic neurological complication: Secondary | ICD-10-CM

## 2018-03-12 DIAGNOSIS — M25641 Stiffness of right hand, not elsewhere classified: Secondary | ICD-10-CM

## 2018-03-12 DIAGNOSIS — Q828 Other specified congenital malformations of skin: Secondary | ICD-10-CM

## 2018-03-12 DIAGNOSIS — M25642 Stiffness of left hand, not elsewhere classified: Secondary | ICD-10-CM | POA: Diagnosis present

## 2018-03-12 DIAGNOSIS — B351 Tinea unguium: Secondary | ICD-10-CM | POA: Diagnosis not present

## 2018-03-12 DIAGNOSIS — R208 Other disturbances of skin sensation: Secondary | ICD-10-CM

## 2018-03-12 DIAGNOSIS — M79676 Pain in unspecified toe(s): Secondary | ICD-10-CM | POA: Diagnosis not present

## 2018-03-12 DIAGNOSIS — M79641 Pain in right hand: Secondary | ICD-10-CM

## 2018-03-12 DIAGNOSIS — M79642 Pain in left hand: Secondary | ICD-10-CM

## 2018-03-12 NOTE — Progress Notes (Addendum)
Complaint:  Visit Type: Patient returns to my office for continued preventative foot care services. Complaint: Patient states" my nails have grown long and thick and become painful to walk and wear shoes" Patient has been diagnosed with DM with neuropathy.. The patient presents for preventative foot care services. No changes to ROS  Podiatric Exam: Vascular: dorsalis pedis and posterior tibial pulses are palpable bilateral. Capillary return is immediate. Temperature gradient is WNL. Skin turgor WNL  Sensorium: Normal Semmes Weinstein monofilament test. Normal tactile sensation bilaterally. Nail Exam: Pt has thick disfigured discolored nails with subungual debris noted bilateral entire nail hallux through fifth toenails Ulcer Exam: There is no evidence of ulcer or pre-ulcerative changes or infection. Orthopedic Exam: Muscle tone and strength are WNL. No limitations in general ROM. No crepitus or effusions noted. Foot type and digits show no abnormalities. Bony prominences are unremarkable. Swelling feet  B/L Skin:  Porokeratosis sub 4th right .Marland Kitchen  Diffuse plantar tyloma.. No infection or ulcers  Diagnosis:  Onychomycosis, , Pain in right toe, pain in left toes. Porokeratosis  right  Treatment & Plan Procedures and Treatment: Consent by patient was obtained for treatment procedures. The patient understood the discussion of treatment and procedures well. All questions were answered thoroughly reviewed. Debridement of mycotic and hypertrophic toenails, 1 through 5 bilateral and clearing of subungual debris. No ulceration, no infection noted. Debride porokeratosis. Told to use padding between 4,5 right.  Lambs wool. Return Visit-Office Procedure: Patient instructed to return to the office for a follow up visit 3 months for continued evaluation and treatment.    Helane Gunther DPM

## 2018-03-12 NOTE — Therapy (Signed)
[facility] [address] [place], [address] Phone: [phone]   Fax:  [phone]  Occupational Therapy Evaluation  Patient Details  Name: Cheryl Rios MRN: [id number] Date of Birth: January 20, 1926 Referring Provider (OT): Baird Lyons   Encounter Date: 03/12/2018  OT End of Session - 03/12/18 1226    Visit Number  1    Number of Visits  2    Date for OT Re-Evaluation  03/26/18    OT Start Time  1121    OT Stop Time  1220    OT Time Calculation (min)  59 min    Activity Tolerance  Patient tolerated treatment well       Past Medical History:  Diagnosis Date  . Arrhythmia   . Arthritis   . Diabetes mellitus without complication (HCC)   . Diverticulosis   . Dysrhythmia   . Edema of both feet   . HOH (hard of hearing)   . Hypertension   . Hypothyroidism   . Neuropathy   . Osteoporosis   . Sleep apnea    OSA--Use C-PAP  . Thyroid disease     Past Surgical History:  Procedure Laterality Date  . ABDOMINAL HYSTERECTOMY    . CATARACT EXTRACTION W/PHACO Right 07/19/2016   Procedure: CATARACT EXTRACTION PHACO AND INTRAOCULAR LENS PLACEMENT (IOC);  Surgeon: Lockie Mola, MD;  Location: [facility] ORS;  Service: Ophthalmology;  Laterality: Right;  [place] 0:55.8 AP% 14.3 CDE 9.52 Fluid pack lot # [id number]  . CATARACT EXTRACTION W/PHACO Left 08/18/2016   Procedure: CATARACT EXTRACTION PHACO AND INTRAOCULAR LENS PLACEMENT (IOC);  Surgeon: Lockie Mola, MD;  Location: [facility] ORS;  Service: Ophthalmology;  Laterality: Left;  US1:03.1 AP%20.6 CDE13.2 Fluid pack lot # [id number]  . CHOLECYSTECTOMY    . ERCP N/A 02/07/2017   Procedure: ENDOSCOPIC RETROGRADE CHOLANGIOPANCREATOGRAPHY (ERCP);  Surgeon: Midge Minium, MD;  Location: [facility] ENDOSCOPY;  Service: Endoscopy;  Laterality: N/A;  . GALLBLADDER SURGERY    . HEMORRHOID SURGERY    . JOINT REPLACEMENT Bilateral    KNEE REPLACEMENT  . KNEE SURGERY Right      There were no vitals filed for this visit.  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  Yes    Pain Score  6     Pain Location  Hand    Pain Orientation  Right;Left    Pain Descriptors / Indicators  Tightness;Sore   stiffness   Pain Onset  More than a month ago    Aggravating Factors   with movement and sometimes at rest - constant         [facility] OT Assessment - 03/12/18 0001      Assessment   Medical Diagnosis  Bilateral hand pain and stiffness    Referring Provider (OT)  Baird Lyons    Onset Date/Surgical Date  02/08/18    Hand Dominance  Right    Next MD Visit  --   5th Nov   Prior Therapy  4-5  yrs ago      Precautions   Required Braces or Orthoses  --   bilateral prefab wrist splints     Home  Environment   Lives With  Alone      Prior Function   Leisure  Likes to read books, bake cakes for church      Strength   Right Hand Grip (lbs)  32    Right Hand Lateral Pinch  10 lbs    Right Hand 3 Point Pinch  8 lbs    Left Hand Grip (lbs)  25    Left Hand Lateral Pinch  9 lbs    Left Hand 3 Point Pinch  6 lbs      Right Hand AROM   R Index  MCP 0-90  70 Degrees    R Index PIP 0-100  100 Degrees    R Long  MCP 0-90  90 Degrees    R Long PIP 0-100  95 Degrees    R Ring  MCP 0-90  90 Degrees    R Ring PIP 0-100  95 Degrees    R Little  MCP 0-90  90 Degrees    R Little PIP 0-100  95 Degrees      Left Hand AROM   L Index  MCP 0-90  65 Degrees    L Index PIP 0-100  95 Degrees    L Long  MCP 0-90  80 Degrees    L Long PIP 0-100  95 Degrees    L Ring  MCP 0-90  80 Degrees    L Ring PIP 0-100  90  Degrees    L Little  MCP 0-90  80 Degrees    L Little PIP 0-100  90 Degrees       Paraffin bath done for bilateral hands- decrease stiffness and pain  Pt to wear compression glove - isotoner gloves fitted to wear to tolerance and can wear under prefab wrist splints    Home program review:   Moist heat am and pm   with HEP for tendon glides in am  Opposition to all digits  Med N glide 5 reps  Moist heat in pm to calm pain down and use isotoner glove in combination with wrist splints Joint protection principles provided and review with pt  Hand out provided              OT Education - 03/12/18 1225    Education Details  findings of eval and Homeprogram review    Person(s) Educated  Patient    Methods  Explanation;Demonstration;Tactile cues;Handout    Comprehension  Returned demonstration;Verbalized understanding       OT Short Term Goals - 03/12/18 1453      OT SHORT TERM GOAL #1   Title  Pt to be independent in homeprogram to decrease pain/numbness/ soreness to  maintain bilateral  / increase L MC's  AROM and grip/ 3 point strength     Baseline  see flowsheet- increase pain from 6-8/10     Time  2    Period  Weeks    Status  New    Target Date  03/26/18        OT Long Term Goals - 03/12/18 1456      OT LONG TERM GOAL #1   Title  Pt to demo or verbalize 3 joint protection principles to implemented to decrease pain and increase ease in ADL's and IADLs    Baseline  no  knowledge     Time  2    Period  Weeks    Status  New    Target Date  03/26/18            Plan - 03/12/18 1227    Clinical Impression Statement  Pt arrive at OT eval with diagnosis of OA with increase hand stiffness and pain - pt on Prednisone that will be done in 2 days and Voltarin ointment - she report she is wearing prefab wrist splints at night time and some during day since seen Dr - pt report that her IP of bilateral thumbs the most sore and R index - also some numbness in R hand  worse than L at night time - and trigger finger on L 4th - pt shoe decrease AROM in L hand more than R - and grip and 3 point  less than her range - but still [facility] - pt can benefit from OT services to decrease soreness and pain , decrease stiffness  maintain her  independence in ADL's and IADL's      Occupational performance deficits (Please refer to evaluation for details):  ADL's;IADL's;Rest and Sleep;Play;Leisure    Rehab Potential  Fair    Current Impairments/barriers affecting progress:  chronic condition -and use to doing things in certain way     OT Frequency  Biweekly    OT Duration  2 weeks    OT Treatment/Interventions  Self-care/ADL training;Paraffin;Moist Heat;Patient/family education;Splinting;Therapeutic exercise;Manual Therapy    Plan  progress in homeprogram - and questions of modification for baking and reading     Clinical Decision Making  Limited treatment options, no task modification necessary    OT Home Exercise Plan  see pt instruction     Consulted and Agree with Plan of Care  Patient       Patient will benefit from skilled therapeutic intervention in order to improve the following deficits and impairments:  Impaired flexibility, Pain, Decreased strength, Impaired UE functional use  Visit Diagnosis: Pain in right hand - Plan: Ot plan of care cert/re-cert  Pain in left hand - Plan: Ot plan of care cert/re-cert  Stiffness of left hand, not elsewhere classified - Plan: Ot plan of care cert/re-cert  Stiffness of right hand, not elsewhere classified - Plan: Ot plan of care cert/re-cert  Other disturbances of skin sensation - Plan: Ot plan of care cert/re-cert    Problem List Patient Active Problem List   Diagnosis Date Noted  . Choledocholithiasis   . Sepsis (HCC) 02/05/2017  . Fever 02/04/2017  . Weakness 02/04/2017  . Failure to thrive in adult 02/04/2017  . OA (osteoarthritis) 02/04/2017  . Status post total left knee replacement 09/19/2016    Oletta Cohn OTR/L,CLT 03/12/2018, 2:59 PM  [facility] [address], [place], [address] Phone: [phone]   Fax:  [phone]  Name: Cheryl Rios MRN: [id number] Date of Birth: 14-May-1926

## 2018-03-12 NOTE — Patient Instructions (Signed)
Moist heat am and pm   with HEP for tendon glides in am  Opposition to all digits  Med N glide 5 reps  Moist heat in pm to calm pain down and use isotoner glove in combination with wrist splints Joint protection principles provided and review with pt  Hand out provided

## 2018-03-14 ENCOUNTER — Other Ambulatory Visit: Payer: Self-pay | Admitting: Family Medicine

## 2018-03-14 DIAGNOSIS — Z1231 Encounter for screening mammogram for malignant neoplasm of breast: Secondary | ICD-10-CM

## 2018-03-26 ENCOUNTER — Ambulatory Visit: Payer: MEDICARE | Attending: Rheumatology | Admitting: Occupational Therapy

## 2018-03-26 DIAGNOSIS — M79642 Pain in left hand: Secondary | ICD-10-CM | POA: Diagnosis present

## 2018-03-26 DIAGNOSIS — M25642 Stiffness of left hand, not elsewhere classified: Secondary | ICD-10-CM | POA: Diagnosis present

## 2018-03-26 DIAGNOSIS — R208 Other disturbances of skin sensation: Secondary | ICD-10-CM | POA: Diagnosis present

## 2018-03-26 DIAGNOSIS — M25641 Stiffness of right hand, not elsewhere classified: Secondary | ICD-10-CM

## 2018-03-26 DIAGNOSIS — M79641 Pain in right hand: Secondary | ICD-10-CM | POA: Diagnosis present

## 2018-03-26 NOTE — Therapy (Signed)
[facility] [address], [place], [address] Phone: [phone]   Fax:  [phone]  Occupational Therapy Treatment  Patient Details  Name: Cheryl Rios MRN: [id number] Date of Birth: November 29, 1925 Referring Provider (OT): Baird Lyons   Encounter Date: 03/26/2018  OT End of Session - 03/26/18 1054    Visit Number  2    Number of Visits  2    Date for OT Re-Evaluation  03/26/18    OT Start Time  1016    OT Stop Time  1050    OT Time Calculation (min)  34 min    Activity Tolerance  Patient tolerated treatment well    Behavior During Therapy  [facility] for tasks assessed/performed       Past Medical History:  Diagnosis Date  . Arrhythmia   . Arthritis   . Diabetes mellitus without complication (HCC)   . Diverticulosis   . Dysrhythmia   . Edema of both feet   . HOH (hard of hearing)   . Hypertension   . Hypothyroidism   . Neuropathy   . Osteoporosis   . Sleep apnea    OSA--Use C-PAP  . Thyroid disease     Past Surgical History:  Procedure Laterality Date  . ABDOMINAL HYSTERECTOMY    . CATARACT EXTRACTION W/PHACO Right 07/19/2016   Procedure: CATARACT EXTRACTION PHACO AND INTRAOCULAR LENS PLACEMENT (IOC);  Surgeon: Lockie Mola, MD;  Location: [facility] ORS;  Service: Ophthalmology;  Laterality: Right;  [place] 0:55.8 AP% 14.3 CDE 9.52 Fluid pack lot # [id number]  . CATARACT EXTRACTION W/PHACO Left 08/18/2016   Procedure: CATARACT EXTRACTION PHACO AND INTRAOCULAR LENS PLACEMENT (IOC);  Surgeon: Lockie Mola, MD;  Location: [facility] ORS;  Service: Ophthalmology;  Laterality: Left;  US1:03.1 AP%20.6 CDE13.2 Fluid pack lot # [id number]  . CHOLECYSTECTOMY    . ERCP N/A 02/07/2017   Procedure: ENDOSCOPIC RETROGRADE CHOLANGIOPANCREATOGRAPHY (ERCP);  Surgeon: Midge Minium, MD;  Location: [facility] ENDOSCOPY;  Service: Endoscopy;  Laterality: N/A;  . GALLBLADDER SURGERY    . HEMORRHOID SURGERY    . JOINT REPLACEMENT  Bilateral    KNEE REPLACEMENT  . KNEE SURGERY Right     There were no vitals filed for this visit.  Subjective Assessment - 03/26/18 1036    Subjective   I stopped the prednisone and use warm water in the am -and at night time put on that iontment with the gloves you gave me and wrist splints - and they feel really good- can you do that paraffin again today     Patient Stated Goals  If I can get the soreness and stiffness better in my hands     Currently in Pain?  Yes    Pain Score  3     Pain Location  Hand    Pain Orientation  Right;Left    Pain Descriptors / Indicators  Aching;Tightness    Pain Type  Chronic pain         [facility] OT Assessment - 03/26/18 0001      Strength   Right Hand Grip (lbs)  40    Right Hand Lateral Pinch  13 lbs    Right Hand 3 Point Pinch  10 lbs    Left Hand Grip (lbs)  30    Left Hand Lateral Pinch  12 lbs    Left Hand 3 Point Pinch  8 lbs      Right Hand AROM   R Index  MCP 0-90  75 Degrees    R Index PIP 0-100  95 Degrees    R Long  MCP 0-90  75 Degrees    R Long PIP 0-100  95 Degrees    R Ring  MCP 0-90  85 Degrees    R Ring PIP 0-100  100 Degrees    R Little  MCP 0-90  90 Degrees    R Little PIP 0-100  95 Degrees      Left Hand AROM   L Index  MCP 0-90  75 Degrees    L Index PIP 0-100  95 Degrees    L Long  MCP 0-90  85 Degrees    L Long PIP 0-100  95 Degrees    L Ring  MCP 0-90  85 Degrees    L Ring PIP 0-100  100 Degrees    L Little  MCP 0-90  90 Degrees    L Little PIP 0-100  95 Degrees      assess grip , prehension strength  As well as AROM in bilateral digits - see flowsheet          OT Treatments/Exercises (OP) - 03/26/18 0001      RUE Paraffin   Number Minutes Paraffin  10 Minutes    RUE Paraffin Location  Hand    Comments  prior to review of HEP       LUE Paraffin   Number Minutes Paraffin  10 Minutes    LUE Paraffin Location  Hand    Comments  prior to review of HEP       soft tissue mobs done - MC  spreads and Carpal spreads- and review HEP for tendon glides, Opposition and med N glide   10 reps  2 x day  Review splint wearing and gloves And modifications and joint protection - larger joints and avoid tight and sustained grip  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  and recommend paraffin bath to use at home - doing great after that in the am for ROM and evening for pain       OT Education - 03/26/18 1054    Education Details  HEP , ROM , paraffin , joint protection and adapting or modifying act     Person(s) Educated  Patient    Methods  Explanation;Demonstration;Tactile cues;Handout    Comprehension  Returned demonstration;Verbalized understanding       OT Short Term Goals - 03/26/18 1057      OT SHORT TERM GOAL #1   Title  Pt to be independent in homeprogram to decrease pain/numbness/ soreness to  maintain bilateral  / increase L MC's  AROM and grip/ 3 point strength     Status  Achieved        OT Long Term Goals - 03/26/18 1057      OT LONG TERM GOAL #1   Title  Pt to demo or verbalize 3 joint protection principles to implemented to decrease pain and increase ease in ADL's and IADLs    Status  Achieved            Plan - 03/26/18 1055    Clinical Impression Statement  Pt return after 2 wks of doing homeprogram - pt pain decrease to 3/10 at the worse, grip and prehension increase in bilateral hands -and AROM in joints about same - L improved little - pt report she do some joint protection but reinforce again use of modifying and AE - pt cont to use  voltarin ointment at night time with isotoner gloves and wrist splint - she denies numbness at night time and very little triggering on L 4th - pt discharge at this time     OT Treatment/Interventions  Self-care/ADL training;Paraffin;Moist Heat;Patient/family education;Splinting;Therapeutic exercise;Manual Therapy    Plan  discharge with homeprogram     Clinical Decision Making  Limited treatment options, no task modification  necessary    OT Home Exercise Plan  see pt instruction     Consulted and Agree with Plan of Care  Patient       Patient will benefit from skilled therapeutic intervention in order to improve the following deficits and impairments:     Visit Diagnosis: Pain in right hand  Pain in left hand  Stiffness of left hand, not elsewhere classified  Stiffness of right hand, not elsewhere classified  Other disturbances of skin sensation    Problem List Patient Active Problem List   Diagnosis Date Noted  . Choledocholithiasis   . Sepsis (HCC) 02/05/2017  . Fever 02/04/2017  . Weakness 02/04/2017  . Failure to thrive in adult 02/04/2017  . OA (osteoarthritis) 02/04/2017  . Status post total left knee replacement 09/19/2016    Oletta Cohn OTR/l,CLT 03/26/2018, 10:58 AM  [facility]IC[facility] [address], [place], [address] Phone: [phone]   Fax:  [phone]  Name: MILAGRO BELMARES MRN: [id number] Date of Birth: 1926-03-09

## 2018-03-26 NOTE — Patient Instructions (Signed)
Recommend paraffin to use in the am and night time  And cont with same homeprogram as last time

## 2018-04-23 ENCOUNTER — Ambulatory Visit
Admission: RE | Admit: 2018-04-23 | Discharge: 2018-04-23 | Disposition: A | Discharge status: Home / Self Care | Payer: MEDICARE | Source: Ambulatory Visit | Attending: Family Medicine | Admitting: Family Medicine

## 2018-04-23 DIAGNOSIS — Z1231 Encounter for screening mammogram for malignant neoplasm of breast: Secondary | ICD-10-CM | POA: Diagnosis present

## 2018-06-11 ENCOUNTER — Encounter: Payer: Self-pay | Admitting: Podiatry

## 2018-06-11 ENCOUNTER — Ambulatory Visit (INDEPENDENT_AMBULATORY_CARE_PROVIDER_SITE_OTHER): Payer: MEDICARE | Admitting: Podiatry

## 2018-06-11 DIAGNOSIS — Q828 Other specified congenital malformations of skin: Secondary | ICD-10-CM

## 2018-06-11 DIAGNOSIS — B351 Tinea unguium: Secondary | ICD-10-CM | POA: Diagnosis not present

## 2018-06-11 DIAGNOSIS — E1149 Type 2 diabetes mellitus with other diabetic neurological complication: Secondary | ICD-10-CM

## 2018-06-11 DIAGNOSIS — M79676 Pain in unspecified toe(s): Secondary | ICD-10-CM

## 2018-06-11 NOTE — Progress Notes (Signed)
Complaint:  Visit Type: Patient returns to my office for continued preventative foot care services. Complaint: Patient states" my nails have grown long and thick and become painful to walk and wear shoes" Patient has been diagnosed with DM with neuropathy.. The patient presents for preventative foot care services. No changes to ROS  Podiatric Exam: Vascular: dorsalis pedis and posterior tibial pulses are palpable bilateral. Capillary return is immediate. Temperature gradient is WNL. Skin turgor WNL  Sensorium: Normal Semmes Weinstein monofilament test. Normal tactile sensation bilaterally. Nail Exam: Pt has thick disfigured discolored nails with subungual debris noted bilateral entire nail hallux through fifth toenails Ulcer Exam: There is no evidence of ulcer or pre-ulcerative changes or infection. Orthopedic Exam: Muscle tone and strength are WNL. No limitations in general ROM. No crepitus or effusions noted. Foot type and digits show no abnormalities. Bony prominences are unremarkable. Swelling feet  B/L Skin:  Porokeratosis sub 4th right .Marland Kitchen  Diffuse plantar tyloma.. No infection or ulcers  Diagnosis:  Onychomycosis, , Pain in right toe, pain in left toes. Porokeratosis  right  Treatment & Plan Procedures and Treatment: Consent by patient was obtained for treatment procedures. The patient understood the discussion of treatment and procedures well. All questions were answered thoroughly reviewed. Debridement of mycotic and hypertrophic toenails, 1 through 5 bilateral and clearing of subungual debris. No ulceration, no infection noted. Debride porokeratosis. Padding fourth toe right. Return Visit-Office Procedure: Patient instructed to return to the office for a follow up visit 3 months for continued evaluation and treatment.    Helane Gunther DPM

## 2018-09-10 ENCOUNTER — Other Ambulatory Visit: Payer: Self-pay

## 2018-09-10 ENCOUNTER — Encounter: Payer: Self-pay | Admitting: Podiatry

## 2018-09-10 ENCOUNTER — Ambulatory Visit (INDEPENDENT_AMBULATORY_CARE_PROVIDER_SITE_OTHER): Payer: MEDICARE | Admitting: Podiatry

## 2018-09-10 VITALS — Temp 96.8°F

## 2018-09-10 DIAGNOSIS — Q828 Other specified congenital malformations of skin: Secondary | ICD-10-CM | POA: Diagnosis not present

## 2018-09-10 DIAGNOSIS — E1149 Type 2 diabetes mellitus with other diabetic neurological complication: Secondary | ICD-10-CM | POA: Diagnosis not present

## 2018-09-10 DIAGNOSIS — M79676 Pain in unspecified toe(s): Secondary | ICD-10-CM | POA: Diagnosis not present

## 2018-09-10 DIAGNOSIS — B351 Tinea unguium: Secondary | ICD-10-CM | POA: Diagnosis not present

## 2018-09-10 NOTE — Progress Notes (Signed)
Complaint:  Visit Type: Patient returns to my office for continued preventative foot care services. Complaint: Patient states" my nails have grown long and thick and become painful to walk and wear shoes" Patient has been diagnosed with DM with neuropathy.. The patient presents for preventative foot care services. No changes to ROS  Podiatric Exam: Vascular: dorsalis pedis and posterior tibial pulses are palpable bilateral. Capillary return is immediate. Temperature gradient is WNL. Skin turgor WNL  Sensorium: Normal Semmes Weinstein monofilament test. Normal tactile sensation bilaterally. Nail Exam: Pt has thick disfigured discolored nails with subungual debris noted bilateral entire nail hallux through fifth toenails Ulcer Exam: There is no evidence of ulcer or pre-ulcerative changes or infection. Orthopedic Exam: Muscle tone and strength are WNL. No limitations in general ROM. No crepitus or effusions noted. Foot type and digits show no abnormalities. Bony prominences are unremarkable. Swelling feet  B/L Skin:  Porokeratosis sub 4th right .Marland Kitchen  Diffuse plantar tyloma.. No infection or ulcers  Diagnosis:  Onychomycosis, , Pain in right toe, pain in left toes. Porokeratosis  right  Treatment & Plan Procedures and Treatment: Consent by patient was obtained for treatment procedures. The patient understood the discussion of treatment and procedures well. All questions were answered thoroughly reviewed. Debridement of mycotic and hypertrophic toenails, 1 through 5 bilateral and clearing of subungual debris. No ulceration, no infection noted. Debride porokeratosis. Padding fourth toe right. Return Visit-Office Procedure: Patient instructed to return to the office for a follow up visit 3 months for continued evaluation and treatment.    Helane Gunther DPM

## 2018-12-10 ENCOUNTER — Other Ambulatory Visit: Payer: Self-pay

## 2018-12-10 ENCOUNTER — Encounter: Payer: Self-pay | Admitting: Podiatry

## 2018-12-10 ENCOUNTER — Ambulatory Visit (INDEPENDENT_AMBULATORY_CARE_PROVIDER_SITE_OTHER): Payer: MEDICARE | Admitting: Podiatry

## 2018-12-10 DIAGNOSIS — E1149 Type 2 diabetes mellitus with other diabetic neurological complication: Secondary | ICD-10-CM

## 2018-12-10 DIAGNOSIS — Q828 Other specified congenital malformations of skin: Secondary | ICD-10-CM

## 2018-12-10 DIAGNOSIS — B351 Tinea unguium: Secondary | ICD-10-CM

## 2018-12-10 DIAGNOSIS — M79676 Pain in unspecified toe(s): Secondary | ICD-10-CM

## 2018-12-10 NOTE — Progress Notes (Signed)
Complaint:  Visit Type: Patient returns to my office for continued preventative foot care services. Complaint: Patient states" my nails have grown long and thick and become painful to walk and wear shoes" Patient has been diagnosed with DM with neuropathy.. The patient presents for preventative foot care services. No changes to ROS  Podiatric Exam: Vascular: dorsalis pedis and posterior tibial pulses are palpable bilateral. Capillary return is immediate. Temperature gradient is WNL. Skin turgor WNL  Sensorium: Normal Semmes Weinstein monofilament test. Normal tactile sensation bilaterally. Nail Exam: Pt has thick disfigured discolored nails with subungual debris noted bilateral entire nail hallux through fifth toenails Ulcer Exam: There is no evidence of ulcer or pre-ulcerative changes or infection. Orthopedic Exam: Muscle tone and strength are WNL. No limitations in general ROM. No crepitus or effusions noted. Foot type and digits show no abnormalities. Bony prominences are unremarkable. Swelling feet  B/L Skin:  Porokeratosis sub 4th right sub 5th right. No infection or ulcers  Diagnosis:  Onychomycosis, , Pain in right toe, pain in left toes. Porokeratosis  right  Treatment & Plan Procedures and Treatment: Consent by patient was obtained for treatment procedures. The patient understood the discussion of treatment and procedures well. All questions were answered thoroughly reviewed. Debridement of mycotic and hypertrophic toenails, 1 through 5 bilateral and clearing of subungual debris. No ulceration, no infection noted. Debride porokeratosis.  Return Visit-Office Procedure: Patient instructed to return to the office for a follow up visit 3 months for continued evaluation and treatment.    Gardiner Barefoot DPM

## 2019-03-04 ENCOUNTER — Encounter: Payer: Self-pay | Admitting: Podiatry

## 2019-03-04 ENCOUNTER — Ambulatory Visit (INDEPENDENT_AMBULATORY_CARE_PROVIDER_SITE_OTHER): Payer: MEDICARE | Admitting: Podiatry

## 2019-03-04 ENCOUNTER — Other Ambulatory Visit: Payer: Self-pay

## 2019-03-04 DIAGNOSIS — E1149 Type 2 diabetes mellitus with other diabetic neurological complication: Secondary | ICD-10-CM

## 2019-03-04 NOTE — Progress Notes (Signed)
She presents today for chief complaint of calluses and corns of plantar aspect of bilateral foot is also is talking about the sleepiness that she gets with her gabapentin.  She states that she feels like she is sleeping her life away.  Objective: Vital signs are stable alert and oriented x3.  Pulses are palpable.  Neurologic sensorium is unchanged.  Otherwise the rest of the exam is unchanged.  Assessment: Sleepiness with gabapentin and neuropathy.  Plan: I highly recommended that she taper off of the medication rather than quitting cold [place] I expressed to her that she should quit the lunchtime portion this week and then over the next 2 weeks stop with the morning pills.  She understands that she is to stop one 1 week and then 1 in the next week which she will continue the nighttime doses.  I will follow-up with her in 1 month

## 2019-03-11 ENCOUNTER — Ambulatory Visit (INDEPENDENT_AMBULATORY_CARE_PROVIDER_SITE_OTHER): Payer: MEDICARE | Admitting: Podiatry

## 2019-03-11 ENCOUNTER — Encounter: Payer: Self-pay | Admitting: Podiatry

## 2019-03-11 ENCOUNTER — Other Ambulatory Visit: Payer: Self-pay

## 2019-03-11 DIAGNOSIS — B351 Tinea unguium: Secondary | ICD-10-CM | POA: Diagnosis not present

## 2019-03-11 DIAGNOSIS — M79676 Pain in unspecified toe(s): Secondary | ICD-10-CM | POA: Diagnosis not present

## 2019-03-11 DIAGNOSIS — E1149 Type 2 diabetes mellitus with other diabetic neurological complication: Secondary | ICD-10-CM

## 2019-03-11 NOTE — Progress Notes (Signed)
Complaint:  Visit Type: Patient returns to my office for continued preventative foot care services. Complaint: Patient states" my nails have grown long and thick and become painful to walk and wear shoes" Patient has been diagnosed with DM with neuropathy.. The patient presents for preventative foot care services. No changes to ROS.  Dr Milinda Pointer trimmed callus in October during her visit.  Podiatric Exam: Vascular: dorsalis pedis and posterior tibial pulses are palpable bilateral. Capillary return is immediate. Temperature gradient is WNL. Skin turgor WNL  Sensorium: Normal Semmes Weinstein monofilament test. Normal tactile sensation bilaterally. Nail Exam: Pt has thick disfigured discolored nails with subungual debris noted bilateral entire nail hallux through fifth toenails Ulcer Exam: There is no evidence of ulcer or pre-ulcerative changes or infection. Orthopedic Exam: Muscle tone and strength are WNL. No limitations in general ROM. No crepitus or effusions noted. Foot type and digits show no abnormalities. Bony prominences are unremarkable. Swelling feet  B/L Skin:  Porokeratosis sub 4th right sub 5th right. No infection or ulcers  Diagnosis:  Onychomycosis, , Pain in right toe, pain in left toes.   Treatment & Plan Procedures and Treatment: Consent by patient was obtained for treatment procedures. The patient understood the discussion of treatment and procedures well. All questions were answered thoroughly reviewed. Debridement of mycotic and hypertrophic toenails, 1 through 5 bilateral and clearing of subungual debris. No ulceration, no infection noted. Return Visit-Office Procedure: Patient instructed to return to the office for a follow up visit 3 months for continued evaluation and treatment.    Gardiner Barefoot DPM

## 2019-03-18 ENCOUNTER — Other Ambulatory Visit: Payer: Self-pay | Admitting: Family Medicine

## 2019-03-18 DIAGNOSIS — Z1231 Encounter for screening mammogram for malignant neoplasm of breast: Secondary | ICD-10-CM

## 2019-04-25 ENCOUNTER — Ambulatory Visit
Admission: RE | Admit: 2019-04-25 | Discharge: 2019-04-25 | Disposition: A | Discharge status: Home / Self Care | Payer: MEDICARE | Source: Ambulatory Visit | Attending: Family Medicine | Admitting: Family Medicine

## 2019-04-25 DIAGNOSIS — Z1231 Encounter for screening mammogram for malignant neoplasm of breast: Secondary | ICD-10-CM | POA: Diagnosis present

## 2019-06-10 ENCOUNTER — Ambulatory Visit (INDEPENDENT_AMBULATORY_CARE_PROVIDER_SITE_OTHER): Payer: MEDICARE | Admitting: Podiatry

## 2019-06-10 ENCOUNTER — Other Ambulatory Visit: Payer: Self-pay

## 2019-06-10 ENCOUNTER — Encounter: Payer: Self-pay | Admitting: Podiatry

## 2019-06-10 DIAGNOSIS — B351 Tinea unguium: Secondary | ICD-10-CM

## 2019-06-10 DIAGNOSIS — Q828 Other specified congenital malformations of skin: Secondary | ICD-10-CM

## 2019-06-10 DIAGNOSIS — M79676 Pain in unspecified toe(s): Secondary | ICD-10-CM | POA: Diagnosis not present

## 2019-06-10 DIAGNOSIS — E1149 Type 2 diabetes mellitus with other diabetic neurological complication: Secondary | ICD-10-CM | POA: Diagnosis not present

## 2019-06-10 NOTE — Progress Notes (Signed)
Complaint:  Visit Type: Patient returns to my office for continued preventative foot care services. Complaint: Patient states" my nails have grown long and thick and become painful to walk and wear shoes"  Patient says she has painful callus on the outside ball of both feet. Patient has been diagnosed with DM with neuropathy.. The patient presents for preventative foot care services. No changes to ROS  Podiatric Exam: Vascular: dorsalis pedis and posterior tibial pulses are palpable bilateral. Capillary return is immediate. Temperature gradient is WNL. Skin turgor WNL  Sensorium: Normal Semmes Weinstein monofilament test. Normal tactile sensation bilaterally. Nail Exam: Pt has thick disfigured discolored nails with subungual debris noted bilateral entire nail hallux through fifth toenails Ulcer Exam: There is no evidence of ulcer or pre-ulcerative changes or infection. Orthopedic Exam: Muscle tone and strength are WNL. No limitations in general ROM. No crepitus or effusions noted. Foot type and digits show no abnormalities. Bony prominences are unremarkable. Swelling feet  B/L Skin:  Porokeratosis sub 4th bilateral.. No infection or ulcers  Diagnosis:  Onychomycosis, , Pain in right toe, pain in left toes. Porokeratosis  B/L.  Treatment & Plan Procedures and Treatment: Consent by patient was obtained for treatment procedures. The patient understood the discussion of treatment and procedures well. All questions were answered thoroughly reviewed. Debridement of mycotic and hypertrophic toenails, 1 through 5 bilateral and clearing of subungual debris. No ulceration, no infection noted. Debride porokeratosis. Padding applied right shoe. Return Visit-Office Procedure: Patient instructed to return to the office for a follow up visit 3 months for continued evaluation and treatment.    Helane Gunther DPM

## 2019-07-10 ENCOUNTER — Telehealth: Payer: Self-pay | Admitting: *Deleted

## 2019-07-10 MED ORDER — GABAPENTIN 600 MG PO TABS: 600.0000 mg | ORAL_TABLET | Freq: Every day | ORAL | 3 refills | Status: DC

## 2019-07-10 NOTE — Telephone Encounter (Signed)
Patient takes 600mg  of gabapentin and requesting new prescription - sent gabapentin 600mg  #90 with 3 refills to [facility].- patient aware

## 2019-09-09 ENCOUNTER — Ambulatory Visit: Payer: MEDICARE | Admitting: Podiatry

## 2019-09-16 ENCOUNTER — Ambulatory Visit: Payer: MEDICARE | Admitting: Podiatry

## 2019-09-19 ENCOUNTER — Ambulatory Visit (INDEPENDENT_AMBULATORY_CARE_PROVIDER_SITE_OTHER): Payer: MEDICARE | Admitting: Podiatry

## 2019-09-19 ENCOUNTER — Other Ambulatory Visit: Payer: Self-pay

## 2019-09-19 ENCOUNTER — Encounter: Payer: Self-pay | Admitting: Podiatry

## 2019-09-19 DIAGNOSIS — B351 Tinea unguium: Secondary | ICD-10-CM

## 2019-09-19 DIAGNOSIS — M79676 Pain in unspecified toe(s): Secondary | ICD-10-CM

## 2019-09-19 DIAGNOSIS — E1149 Type 2 diabetes mellitus with other diabetic neurological complication: Secondary | ICD-10-CM

## 2019-09-19 NOTE — Progress Notes (Signed)
This patient presents to the office with chief complaint of long thick painful nails.  Patient says the nails are painful walking and wearing shoes.  This patient is unable to self treat.  This patient is unable to trim her nails since she is unable to reach her nails.  She presents to the office for preventative foot care services.  She also relates pain through both forefeet when walking.  Patient is diabetic with neuropathy.    General Appearance  Alert, conversant and in no acute stress.  Vascular  Dorsalis pedis and posterior tibial  pulses are palpable  bilaterally.  Capillary return is within normal limits  bilaterally. Temperature is within normal limits  bilaterally.  Neurologic  Senn-Weinstein monofilament wire test within normal limits  bilaterally. Muscle power within normal limits bilaterally.  Nails Thick disfigured discolored nails with subungual debris  from hallux to fifth toes bilaterally. No evidence of bacterial infection or drainage bilaterally.  Orthopedic  No limitations of motion  feet .  No crepitus or effusions noted.  No bony pathology or digital deformities noted.  Prominent metatarsal heads fourth B/L.    Skin  normotropic skin with no porokeratosis noted bilaterally.  No signs of infections or ulcers noted.     Onychomycosis  Nails  B/L.  Pain in right toes  Pain in left toes  Debridement of nails both feet followed trimming the nails with dremel tool.    RTC 3 months. Told patient I will send her information concerning gel dispersion pads.   Helane Gunther DPM

## 2019-10-22 ENCOUNTER — Telehealth: Payer: Self-pay

## 2019-10-22 NOTE — Telephone Encounter (Signed)
Patient called wanting to know if she can stop taking Gabapentin without any side effects.  She states the medicaiton has become too expensive and she said that she could not tell that it helped her anyway.  Per Dr. Logan Bores verbal, since patient only takes at night, she can stop.  I have called patient back and let her know.

## 2019-12-23 ENCOUNTER — Encounter: Payer: Self-pay | Admitting: Podiatry

## 2019-12-23 ENCOUNTER — Ambulatory Visit (INDEPENDENT_AMBULATORY_CARE_PROVIDER_SITE_OTHER): Payer: MEDICARE | Admitting: Podiatry

## 2019-12-23 ENCOUNTER — Other Ambulatory Visit: Payer: Self-pay

## 2019-12-23 DIAGNOSIS — Q828 Other specified congenital malformations of skin: Secondary | ICD-10-CM

## 2019-12-23 DIAGNOSIS — B351 Tinea unguium: Secondary | ICD-10-CM | POA: Diagnosis not present

## 2019-12-23 DIAGNOSIS — E1149 Type 2 diabetes mellitus with other diabetic neurological complication: Secondary | ICD-10-CM

## 2019-12-23 DIAGNOSIS — M79676 Pain in unspecified toe(s): Secondary | ICD-10-CM | POA: Diagnosis not present

## 2019-12-23 NOTE — Progress Notes (Signed)
This patient presents to the office with chief complaint of long thick painful nails.  Patient says the nails are painful walking and wearing shoes.  This patient is unable to self treat.  This patient is unable to trim her nails since she is unable to reach her nails.  She presents to the office for preventative foot care services.  She also relates pain through both forefeet when walking.  Patient is diabetic with neuropathy.    General Appearance  Alert, conversant and in no acute stress.  Vascular  Dorsalis pedis and posterior tibial  pulses are palpable  bilaterally.  Capillary return is within normal limits  bilaterally. Temperature is within normal limits  bilaterally.  Neurologic  Senn-Weinstein monofilament wire test within normal limits  bilaterally. Muscle power within normal limits bilaterally.  Nails Thick disfigured discolored nails with subungual debris  from hallux to fifth toes bilaterally. No evidence of bacterial infection or drainage bilaterally.  Orthopedic  No limitations of motion  feet .  No crepitus or effusions noted.  No bony pathology or digital deformities noted.  Prominent metatarsal heads fourth B/L.    Skin  normotropic skin with no porokeratosis noted bilaterally.  No signs of infections or ulcers noted.     Onychomycosis  Nails  B/L.  Pain in right toes  Pain in left toes  Debridement of nails both feet followed trimming the nails with dremel tool.    RTC 3 months.    Helane Gunther DPM

## 2020-03-18 ENCOUNTER — Other Ambulatory Visit: Payer: Self-pay | Admitting: Family Medicine

## 2020-03-18 DIAGNOSIS — Z1231 Encounter for screening mammogram for malignant neoplasm of breast: Secondary | ICD-10-CM

## 2020-03-30 ENCOUNTER — Ambulatory Visit: Payer: MEDICARE | Admitting: Podiatry

## 2020-04-09 ENCOUNTER — Encounter: Payer: Self-pay | Admitting: Podiatry

## 2020-04-09 ENCOUNTER — Ambulatory Visit (INDEPENDENT_AMBULATORY_CARE_PROVIDER_SITE_OTHER): Payer: MEDICARE | Admitting: Podiatry

## 2020-04-09 ENCOUNTER — Other Ambulatory Visit: Payer: Self-pay

## 2020-04-09 DIAGNOSIS — M79676 Pain in unspecified toe(s): Secondary | ICD-10-CM | POA: Diagnosis not present

## 2020-04-09 DIAGNOSIS — E1149 Type 2 diabetes mellitus with other diabetic neurological complication: Secondary | ICD-10-CM

## 2020-04-09 DIAGNOSIS — B351 Tinea unguium: Secondary | ICD-10-CM | POA: Diagnosis not present

## 2020-04-09 DIAGNOSIS — Q828 Other specified congenital malformations of skin: Secondary | ICD-10-CM

## 2020-04-09 NOTE — Progress Notes (Signed)
This patient presents to the office with chief complaint of long thick painful nails.  Patient says the nails are painful walking and wearing shoes.  This patient is unable to self treat.  This patient is unable to trim her nails since she is unable to reach her nails.  She presents to the office for preventative foot care services.    Patient is diabetic with neuropathy.    General Appearance  Alert, conversant and in no acute stress.  Vascular  Dorsalis pedis and posterior tibial  pulses are palpable  bilaterally.  Capillary return is within normal limits  bilaterally. Temperature is within normal limits  bilaterally.  Neurologic  Senn-Weinstein monofilament wire test within normal limits  bilaterally. Muscle power within normal limits bilaterally.  Nails Thick disfigured discolored nails with subungual debris  from hallux to fifth toes bilaterally. No evidence of bacterial infection or drainage bilaterally.  Orthopedic  No limitations of motion  feet .  No crepitus or effusions noted.  No bony pathology or digital deformities noted.  Prominent metatarsal heads fourth B/L.    Skin  normotropic skin.  Heel callus  B/L.  Porokeratosis sub 4th  B/L   No signs of infections or ulcers noted.     Onychomycosis  Nails  B/L.  Pain in right toes  Pain in left toes.  Porokeratosis sub 4th  B/L.  Debridement of nails both feet followed trimming the nails with dremel tool.    RTC 3 months.    Helane Gunther DPM

## 2020-04-27 ENCOUNTER — Ambulatory Visit
Admission: RE | Admit: 2020-04-27 | Discharge: 2020-04-27 | Disposition: A | Discharge status: Home / Self Care | Payer: MEDICARE | Source: Ambulatory Visit | Attending: Family Medicine | Admitting: Family Medicine

## 2020-04-27 ENCOUNTER — Other Ambulatory Visit: Payer: Self-pay

## 2020-04-27 DIAGNOSIS — Z1231 Encounter for screening mammogram for malignant neoplasm of breast: Secondary | ICD-10-CM | POA: Diagnosis present

## 2020-07-13 ENCOUNTER — Ambulatory Visit (INDEPENDENT_AMBULATORY_CARE_PROVIDER_SITE_OTHER): Payer: MEDICARE | Admitting: Podiatry

## 2020-07-13 ENCOUNTER — Other Ambulatory Visit: Payer: Self-pay

## 2020-07-13 ENCOUNTER — Encounter: Payer: Self-pay | Admitting: Podiatry

## 2020-07-13 DIAGNOSIS — E1149 Type 2 diabetes mellitus with other diabetic neurological complication: Secondary | ICD-10-CM

## 2020-07-13 DIAGNOSIS — M79676 Pain in unspecified toe(s): Secondary | ICD-10-CM

## 2020-07-13 DIAGNOSIS — B351 Tinea unguium: Secondary | ICD-10-CM | POA: Diagnosis not present

## 2020-07-13 DIAGNOSIS — L84 Corns and callosities: Secondary | ICD-10-CM

## 2020-07-13 NOTE — Progress Notes (Signed)
This patient presents to the office with chief complaint of long thick painful nails.  Patient says the nails are painful walking and wearing shoes.  This patient is unable to self treat.  This patient is unable to trim her nails since she is unable to reach her nails.  She presents to the office for preventative foot care services.    Patient is diabetic with neuropathy.    General Appearance  Alert, conversant and in no acute stress.  Vascular  Dorsalis pedis and posterior tibial  pulses are weakly  palpable  bilaterally.  Capillary return is within normal limits  bilaterally. Cold feet  Bilaterally.  Absent hair  B/L.  Neurologic  Senn-Weinstein monofilament wire test within normal limits  bilaterally. Muscle power within normal limits bilaterally.  Nails Thick disfigured discolored nails with subungual debris  from hallux to fifth toes bilaterally. No evidence of bacterial infection or drainage bilaterally.  Orthopedic  No limitations of motion  feet .  No crepitus or effusions noted.  No bony pathology or digital deformities noted.  Prominent metatarsal heads fourth B/L.    Skin  normotropic skin.  Heel callus  B/L.  Porokeratosis/callus  sub 4th  B/L   No signs of infections or ulcers noted.     Onychomycosis  Nails  B/L.  Pain in right toes  Pain in left toes.  Porokeratosis sub 4th  B/L.  Debridement of nails both feet followed trimming the nails with dremel tool.   Debridement of callus with # 15 blade.    RTC 3 months.    Helane Gunther DPM

## 2020-10-12 ENCOUNTER — Ambulatory Visit: Payer: MEDICARE | Admitting: Podiatry

## 2020-10-15 ENCOUNTER — Encounter: Payer: Self-pay | Admitting: Podiatry

## 2020-10-15 ENCOUNTER — Ambulatory Visit (INDEPENDENT_AMBULATORY_CARE_PROVIDER_SITE_OTHER): Payer: MEDICARE | Admitting: Podiatry

## 2020-10-15 ENCOUNTER — Other Ambulatory Visit: Payer: Self-pay

## 2020-10-15 DIAGNOSIS — B351 Tinea unguium: Secondary | ICD-10-CM

## 2020-10-15 DIAGNOSIS — M79676 Pain in unspecified toe(s): Secondary | ICD-10-CM | POA: Diagnosis not present

## 2020-10-15 DIAGNOSIS — E1149 Type 2 diabetes mellitus with other diabetic neurological complication: Secondary | ICD-10-CM

## 2020-10-15 NOTE — Progress Notes (Signed)
This patient presents to the office with chief complaint of long thick painful nails.  Patient says the nails are painful walking and wearing shoes.  This patient is unable to self treat.  This patient is unable to trim her nails since she is unable to reach her nails.  She presents to the office for preventative foot care services.    Patient is diabetic with neuropathy.    General Appearance  Alert, conversant and in no acute stress.  Vascular  Dorsalis pedis and posterior tibial  pulses are weakly  palpable  bilaterally.  Capillary return is within normal limits  bilaterally. Cold feet  Bilaterally.  Absent hair  B/L.  Neurologic  Senn-Weinstein monofilament wire test within normal limits  bilaterally. Muscle power within normal limits bilaterally.  Nails Thick disfigured discolored nails with subungual debris  from hallux to fifth toes bilaterally. No evidence of bacterial infection or drainage bilaterally.  Orthopedic  No limitations of motion  feet .  No crepitus or effusions noted.  No bony pathology or digital deformities noted.  Prominent metatarsal heads fourth B/L.    Skin  normotropic skin.  Heel callus  B/L. Asymptomatic  porokeratosis/callus  sub 4th  B/L   No signs of infections or ulcers noted.     Onychomycosis  Nails  B/L.  Pain in right toes  Pain in left toes.   Debridement of nails both feet with nail nipper  followed trimming the nails with dremel tool.      RTC 3 months.    Helane Gunther DPM

## 2021-01-18 ENCOUNTER — Ambulatory Visit (INDEPENDENT_AMBULATORY_CARE_PROVIDER_SITE_OTHER): Payer: MEDICARE | Admitting: Podiatry

## 2021-01-18 ENCOUNTER — Encounter: Payer: Self-pay | Admitting: Podiatry

## 2021-01-18 ENCOUNTER — Other Ambulatory Visit: Payer: Self-pay

## 2021-01-18 DIAGNOSIS — L84 Corns and callosities: Secondary | ICD-10-CM

## 2021-01-18 DIAGNOSIS — M79676 Pain in unspecified toe(s): Secondary | ICD-10-CM

## 2021-01-18 DIAGNOSIS — E1149 Type 2 diabetes mellitus with other diabetic neurological complication: Secondary | ICD-10-CM

## 2021-01-18 DIAGNOSIS — B351 Tinea unguium: Secondary | ICD-10-CM

## 2021-01-18 NOTE — Progress Notes (Signed)
This patient presents to the office with chief complaint of long thick painful nails.  Patient says the nails are painful walking and wearing shoes.  This patient is unable to self treat.  This patient is unable to trim her nails since she is unable to reach her nails.  She presents to the office for preventative foot care services.    Patient is diabetic with neuropathy.    General Appearance  Alert, conversant and in no acute stress.  Vascular  Dorsalis pedis and posterior tibial  pulses are weakly  palpable  bilaterally.  Capillary return is within normal limits  bilaterally. Cold feet  Bilaterally.  Absent hair  B/L.  Neurologic  Senn-Weinstein monofilament wire test within normal limits  bilaterally. Muscle power within normal limits bilaterally.  Nails Thick disfigured discolored nails with subungual debris  from hallux to fifth toes bilaterally. No evidence of bacterial infection or drainage bilaterally.  Orthopedic  No limitations of motion  feet .  No crepitus or effusions noted.  No bony pathology or digital deformities noted.  Prominent metatarsal heads fourth B/L.    Skin  normotropic skin.  Heel callus  B/L. Symptomatic  porokeratosis/callus  sub 4th right foot.  No signs of infections or ulcers noted.     Onychomycosis  Nails  B/L.  Pain in right toes  Pain in left toes.  Callus right forefoot.  Debridement of nails both feet with nail nipper  followed trimming the nails with dremel tool.   Debride callus right forefoot with dremel tool.  Patient using gel dispersion pads daily   RTC 3 months.      DPM  

## 2021-01-29 NOTE — Progress Notes (Signed)
[facility] [address] [place], [place] [address]  Pulmonary Sleep Medicine   Office Visit Note  Patient Name: Cheryl Rios DOB: [birth year] MRN [id number]    Chief Complaint: Obstructive Sleep Apnea visit  Brief History:  Cheryl Rios is seen today for initial consult to establish care The patient has a 12 year history of sleep apnea. Initial consult hx of  sleep apnea (time frame unkown) symptoms prior to 08/2019 split study included snoring, and medical history of neuropathy, hypertension & diabetes, mellitus,   Patient is using PAP nightly.  The patient feels very well after sleeping with PAP.  The patient reports benefiting from PAP use. Reported sleepiness is  resolved and the Epworth Sleepiness Score is 10 out of 24. The patient does take approx 30 minutes naps daily. The patient complains of the following: still falls asleep easily stating due to her age. The compliance download shows  compliance with an average use time of 6:51 hours@ 99%. The AHI is 1.3  The patient does not complain of limb movements disrupting sleep.  ROS  General: (-) fever, (-) chills, (-) night sweat Nose and Sinuses: (-) nasal stuffiness or itchiness, (-) postnasal drip, (-) nosebleeds, (-) sinus trouble. Mouth and Throat: (-) sore throat, (-) hoarseness. Neck: (-) swollen glands, (-) enlarged thyroid, (-) neck pain. Respiratory: - cough, - shortness of breath, - wheezing. Neurologic: - numbness, - tingling. Psychiatric: - anxiety, - depression   Current Medication: Outpatient Encounter Medications as of 02/01/2021  Medication Sig   ACCU-CHEK SOFTCLIX LANCETS lancets USE 1 EACH ONCE DAILY. USE AS INSTRUCTED.   allopurinol (ZYLOPRIM) 100 MG tablet Take 100 mg by mouth daily.   allopurinol (ZYLOPRIM) 100 MG tablet Take 1.5 tablets by mouth daily.   Ascorbic Acid (VITAMIN C) 100 MG tablet Take 500 mg by mouth daily.    augmented betamethasone dipropionate (DIPROLENE-AF) 0.05 % cream APPLY  TO AFFECTED AREA ON ARMS TWICE A DAY UNTIL CLEAR   B Complex Vitamins (VITAMIN B-COMPLEX) TABS Take by mouth.   Cholecalciferol (VITAMIN D3) 1000 units CAPS Take by mouth.   Cinnamon 500 MG capsule Take 1,000 mg by mouth 2 (two) times daily.    Cobalamin Combinations (VITAMIN B12-FOLIC ACID) 500-400 MCG TABS    cyanocobalamin (,VITAMIN B-12,) 1000 MCG/ML injection Inject into the muscle.   fish oil-omega-3 fatty acids 1000 MG capsule Take 1,200 mg by mouth daily.   fluticasone (FLONASE) 50 MCG/ACT nasal spray Place into the nose.   FOLIC ACID PO Take by mouth.   furosemide (LASIX) 40 MG tablet Take 40 mg by mouth daily. In am.   gabapentin (NEURONTIN) 600 MG tablet Take 1 tablet (600 mg total) by mouth daily after supper.   glucose blood test strip TEST BLOOD SUGAR ONCE DAILY   hydrALAZINE (APRESOLINE) 50 MG tablet TAKE 1 TABLET (50 MG TOTAL) BY MOUTH ONCE DAILY.   hydroxychloroquine (PLAQUENIL) 200 MG tablet Take 200 mg by mouth daily.   ketoconazole (NIZORAL) 2 % cream Apply 1 application topically daily.   levothyroxine (SYNTHROID, LEVOTHROID) 75 MCG tablet TAKE 1 TABLET BY MOUTH ONCE DAILY ON EMPTY STOMACH WITH WATER 30-60 MIN BEFORE BREAKFAST   lisinopril (PRINIVIL,ZESTRIL) 20 MG tablet TAKE 1 TABLET (20 MG TOTAL) BY MOUTH ONCE DAILY.   meloxicam (MOBIC) 7.5 MG tablet    metoprolol succinate (TOPROL-XL) 25 MG 24 hr tablet TAKE 1 TABLET BY MOUTH EVERY DAY   Multiple Vitamins-Minerals (WOMENS 50+ MULTI VITAMIN/MIN PO) Take 1 tablet by mouth daily.  mupirocin ointment (BACTROBAN) 2 % APPLY TO AFFECTED AREA ONCE DAILY FOR 7 DAYS   nystatin cream (MYCOSTATIN) Apply topically.   potassium chloride (KLOR-CON) 10 MEQ tablet TAKE 1 TABLET BY MOUTH ONCE A DAY   triamcinolone cream (KENALOG) 0.5 % APPLY TO AFFECTED AREA TWICE A DAY   vitamin C (ASCORBIC ACID) 500 MG tablet Take by mouth.   vitamin E 400 UNIT capsule Take by mouth.   No facility-administered encounter medications on file as of  02/01/2021.    Surgical History: Past Surgical History:  Procedure Laterality Date   ABDOMINAL HYSTERECTOMY     CATARACT EXTRACTION W/PHACO Right 07/19/2016   Procedure: CATARACT EXTRACTION PHACO AND INTRAOCULAR LENS PLACEMENT (IOC);  Surgeon: Lockie Molahadwick Brasington, MD;  Location: [facility] ORS;  Service: Ophthalmology;  Laterality: Right;  US 0:55.8 AP% 14.3 CDE 9.52 Fluid pack lot # [id number]   CATARACT EXTRACTION W/PHACO Left 08/18/2016   Procedure: CATARACT EXTRACTION PHACO AND INTRAOCULAR LENS PLACEMENT (IOC);  Surgeon: Lockie Molahadwick Brasington, MD;  Location: [facility] ORS;  Service: Ophthalmology;  Laterality: Left;  US1:03.1 AP%20.6 CDE13.2 Fluid pack lot # [id number]   CHOLECYSTECTOMY     ERCP N/A 02/07/2017   Procedure: ENDOSCOPIC RETROGRADE CHOLANGIOPANCREATOGRAPHY (ERCP);  Surgeon: Midge MiniumWohl, Darren, MD;  Location: [facility];  Service: Endoscopy;  Laterality: N/A;   GALLBLADDER SURGERY     HEMORRHOID SURGERY     JOINT REPLACEMENT Bilateral    KNEE REPLACEMENT   KNEE SURGERY Right     Medical History: Past Medical History:  Diagnosis Date   Arrhythmia    Arthritis    Diabetes mellitus without complication (HCC)    Diverticulosis    Dysrhythmia    Edema of both feet    HOH (hard of hearing)    Hypertension    Hypothyroidism    Neuropathy    Osteoporosis    Sleep apnea    OSA--Use C-PAP   Thyroid disease     Family History: Non contributory to the present illness  Social History: Social History   Socioeconomic History   Marital status: Widowed    Spouse name: Not on file   Number of children: Not on file   Years of education: Not on file   Highest education level: Not on file  Occupational History   Not on file  Tobacco Use   Smoking status: Never   Smokeless tobacco: Never  Vaping Use   Vaping Use: Never used  Substance and Sexual Activity   Alcohol use: No   Drug use: No   Sexual activity: Never  Other Topics Concern   Not on file  Social History Narrative    Not on file   Social Determinants of Health   Financial Resource Strain: Not on file  Food Insecurity: Not on file  Transportation Needs: Not on file  Physical Activity: Not on file  Stress: Not on file  Social Connections: Not on file  Intimate Partner Violence: Not on file    Vital Signs: Blood pressure 134/73, pulse 80, resp. rate 16, height 5\' 2"  (1.575 m), weight 182 lb 3.2 oz (82.6 kg), SpO2 96 %.  Examination: General Appearance: The patient is well-developed, well-nourished, and in no distress. Neck Circumference: 45.5 Skin: Gross inspection of skin unremarkable. Head: normocephalic, no gross deformities. Eyes: no gross deformities noted. ENT: ears appear grossly normal Neurologic: Alert and oriented. No involuntary movements.    EPWORTH SLEEPINESS SCALE:  Scale:  (0)= no chance of dozing; (1)= slight chance of dozing; (2)= moderate chance of  dozing; (3)= high chance of dozing  Chance  Situtation    Sitting and reading: 2    Watching TV: 2    Sitting Inactive in public: 2    As a passenger in car: 2      Lying down to rest: 0    Sitting and talking: 0    Sitting quielty after lunch: 2    In a car, stopped in traffic: 0   TOTAL SCORE:   10 out of 24    SLEEP STUDIES:  Split Study 09/14/19 -  AHI  81.9,  Low SpO2 66%,   CPAP COMPLIANCE DATA:  Date Range: 01/29/20 - 01/27/21  Average Daily Use: 6:51 hours  Median Use: 6:51 hours  Compliance for > 4 Hours: 99% days  AHI: 1.3 respiratory events per hour  Days Used: 365/365  Mask Leak: 8 cm  95th Percentile Pressure: 8 cmH2O         LABS: No results found for this or any previous visit (from the past 2160 hour(s)).  Radiology: MM 3D SCREEN BREAST BILATERAL  Result Date: 04/28/2020 CLINICAL DATA:  Screening. EXAM: DIGITAL SCREENING BILATERAL MAMMOGRAM WITH TOMO AND CAD COMPARISON:  Previous exam(s). ACR Breast Density Category c: The breast tissue is heterogeneously dense, which  may obscure small masses. FINDINGS: There are no findings suspicious for malignancy. Images were processed with CAD. IMPRESSION: No mammographic evidence of malignancy. A result letter of this screening mammogram will be mailed directly to the patient. RECOMMENDATION: Screening mammogram in one year. (Code:SM-B-01Y) BI-RADS CATEGORY  1: Negative. Electronically Signed   By: Bary Richard M.D.   On: 04/28/2020 16:07    No results found.  No results found.    Assessment and Plan: Patient Active Problem List   Diagnosis Date Noted   OSA on CPAP 02/01/2021   CPAP use counseling 02/01/2021   Hypertension 02/01/2021   Obesity (BMI 30-39.9) 02/01/2021   Callus 07/13/2020   Pedal edema 11/08/2017   Choledocholithiasis    Sepsis (HCC) 02/05/2017   Fever 02/04/2017   Weakness 02/04/2017   Failure to thrive in adult 02/04/2017   OA (osteoarthritis) 02/04/2017   Status post total left knee replacement 09/19/2016   Osteoporosis 10/04/2013   Elevated LFTs 01/23/2011    1. OSA on CPAP The patient does tolerate PAP and reports definite benefit from PAP use. The patient was reminded how to clean equipment and advised to replace supplies routinely. The patient was also counselled on weight loss and mask leak. The compliance is excellent. The AHI is 1.3.   OSA- continue excellent compliance with cpap. F/u one year.    2. CPAP use counseling CPAP Counseling: had a lengthy discussion with the patient regarding the importance of PAP therapy in management of the sleep apnea. Patient appears to understand the risk factor reduction and also understands the risks associated with untreated sleep apnea. Patient will try to make a good faith effort to remain compliant with therapy. Also instructed the patient on proper cleaning of the device including the water must be changed daily if possible and use of distilled water is preferred. Patient understands that the machine should be regularly cleaned with  appropriate recommended cleaning solutions that do not damage the PAP machine for example given white vinegar and water rinses. Other methods such as ozone treatment may not be as good as these simple methods to achieve cleaning.   3. Hypertension, unspecified type Hypertension Counseling:   The following hypertensive lifestyle modification were recommended  and discussed:  1. Limiting alcohol intake to less than 1 oz/day of ethanol:(24 oz of beer or 8 oz of wine or 2 oz of 100-proof whiskey). 2. Take baby ASA 81 mg daily. 3. Importance of regular aerobic exercise and losing weight. 4. Reduce dietary saturated fat and cholesterol intake for overall cardiovascular health. 5. Maintaining adequate dietary potassium, calcium, and magnesium intake. 6. Regular monitoring of the blood pressure. 7. Reduce sodium intake to less than 100 mmol/day (less than 2.3 gm of sodium or less than 6 gm of sodium choride)    4. Obesity (BMI 30-39.9) Obesity Counseling: Had a lengthy discussion regarding patients BMI and weight issues. Patient was instructed on portion control as well as increased activity. Also discussed caloric restrictions with trying to maintain intake less than 2000 Kcal. Discussions were made in accordance with the 5As of weight management. Simple actions such as not eating late and if able to, taking a walk is suggested.    General Counseling: I have discussed the findings of the evaluation and examination with Arlean.  I have also discussed any further diagnostic evaluation thatmay be needed or ordered today. Yuri verbalizes understanding of the findings of todays visit. We also reviewed her medications today and discussed drug interactions and side effects including but not limited excessive drowsiness and altered mental states. We also discussed that there is always a risk not just to her but also people around her. she has been encouraged to call the office with any questions or concerns that  should arise related to todays visit.  No orders of the defined types were placed in this encounter.       I have personally obtained a history, examined the patient, evaluated laboratory and imaging results, formulated the assessment and plan and placed orders.   This patient was seen today by Emmaline Kluver, PA-C in collaboration with Dr. Freda Munro.     Yevonne Pax, MD [facility] Diplomate ABMS Pulmonary and Critical Care Medicine Sleep medicine

## 2021-02-01 ENCOUNTER — Ambulatory Visit (INDEPENDENT_AMBULATORY_CARE_PROVIDER_SITE_OTHER): Payer: MEDICARE | Admitting: Internal Medicine

## 2021-02-01 ENCOUNTER — Other Ambulatory Visit: Payer: Self-pay

## 2021-02-01 VITALS — BP 134/73 | HR 80 | Resp 16 | Ht 62.0 in | Wt 182.2 lb

## 2021-02-01 DIAGNOSIS — Z7189 Other specified counseling: Secondary | ICD-10-CM

## 2021-02-01 DIAGNOSIS — G4733 Obstructive sleep apnea (adult) (pediatric): Secondary | ICD-10-CM

## 2021-02-01 DIAGNOSIS — Z9989 Dependence on other enabling machines and devices: Secondary | ICD-10-CM

## 2021-02-01 DIAGNOSIS — E669 Obesity, unspecified: Secondary | ICD-10-CM | POA: Diagnosis not present

## 2021-02-01 DIAGNOSIS — I10 Essential (primary) hypertension: Secondary | ICD-10-CM | POA: Diagnosis not present

## 2021-02-01 NOTE — Patient Instructions (Signed)

## 2021-04-22 ENCOUNTER — Encounter: Payer: Self-pay | Admitting: Podiatry

## 2021-04-22 ENCOUNTER — Ambulatory Visit (INDEPENDENT_AMBULATORY_CARE_PROVIDER_SITE_OTHER): Payer: MEDICARE | Admitting: Podiatry

## 2021-04-22 ENCOUNTER — Other Ambulatory Visit: Payer: Self-pay

## 2021-04-22 DIAGNOSIS — M79676 Pain in unspecified toe(s): Secondary | ICD-10-CM

## 2021-04-22 DIAGNOSIS — E1149 Type 2 diabetes mellitus with other diabetic neurological complication: Secondary | ICD-10-CM

## 2021-04-22 DIAGNOSIS — B351 Tinea unguium: Secondary | ICD-10-CM | POA: Diagnosis not present

## 2021-04-22 DIAGNOSIS — L84 Corns and callosities: Secondary | ICD-10-CM

## 2021-04-22 NOTE — Progress Notes (Signed)
This patient presents to the office with chief complaint of long thick painful nails.  Patient says the nails are painful walking and wearing shoes.  This patient is unable to self treat.  This patient is unable to trim her nails since she is unable to reach her nails.  She presents to the office for preventative foot care services.    Patient is diabetic with neuropathy.    General Appearance  Alert, conversant and in no acute stress.  Vascular  Dorsalis pedis and posterior tibial  pulses are weakly  palpable  bilaterally.  Capillary return is within normal limits  bilaterally. Cold feet  Bilaterally.  Absent hair  B/L.  Neurologic  Senn-Weinstein monofilament wire test within normal limits  bilaterally. Muscle power within normal limits bilaterally.  Nails Thick disfigured discolored nails with subungual debris  from hallux to fifth toes bilaterally. No evidence of bacterial infection or drainage bilaterally.  Orthopedic  No limitations of motion  feet .  No crepitus or effusions noted.  No bony pathology or digital deformities noted.  Prominent metatarsal heads fourth B/L.    Skin  normotropic skin.  Heel callus  B/L. Symptomatic  porokeratosis/callus  sub 4th right foot.  No signs of infections or ulcers noted.     Onychomycosis  Nails  B/L.  Pain in right toes  Pain in left toes.  Callus right forefoot.  Debridement of nails both feet with nail nipper  followed trimming the nails with dremel tool.   Debride callus right forefoot with dremel tool.  Patient using gel dispersion pads daily   RTC 3 months.      DPM  

## 2021-05-05 IMAGING — MG DIGITAL SCREENING BILAT W/ TOMO W/ CAD
6 of 10 series · 6 of 30 positions shown · non-contrast
Comparison: Previous exam(s).

CLINICAL DATA: Screening.

EXAM:
DIGITAL SCREENING BILATERAL MAMMOGRAM WITH TOMO AND CAD

[L CC synth-2D]
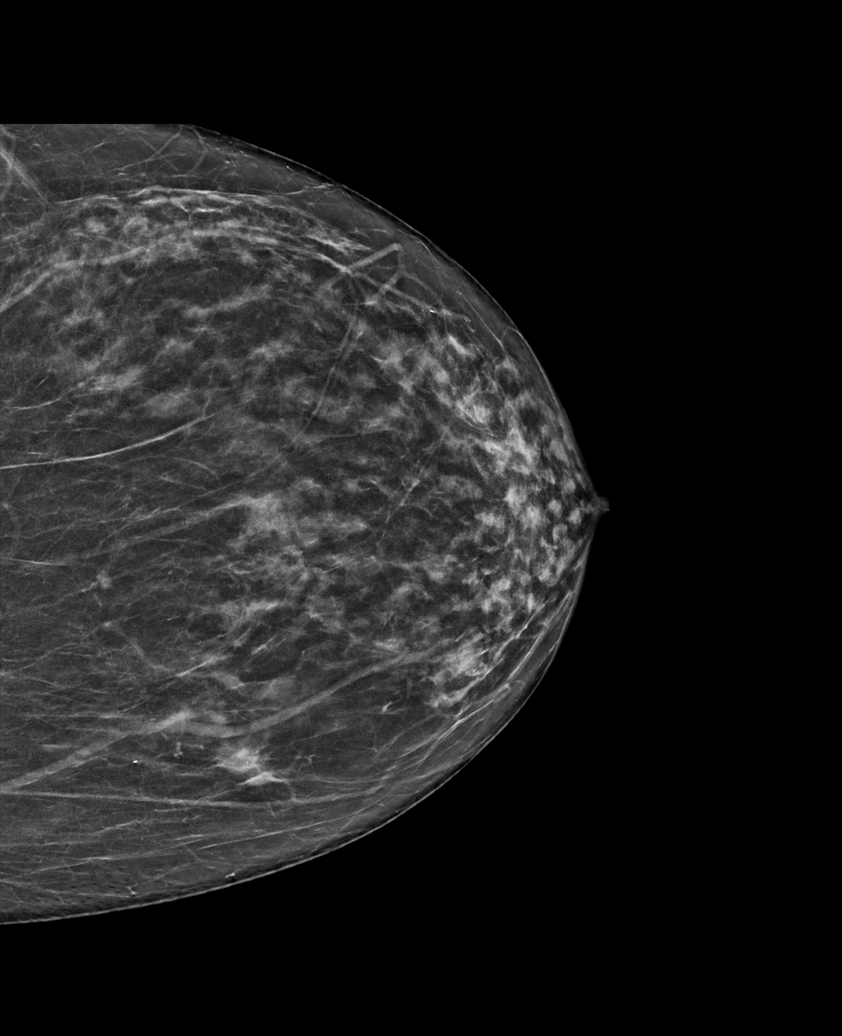

[R MLO synth-2D (1 of 2)]
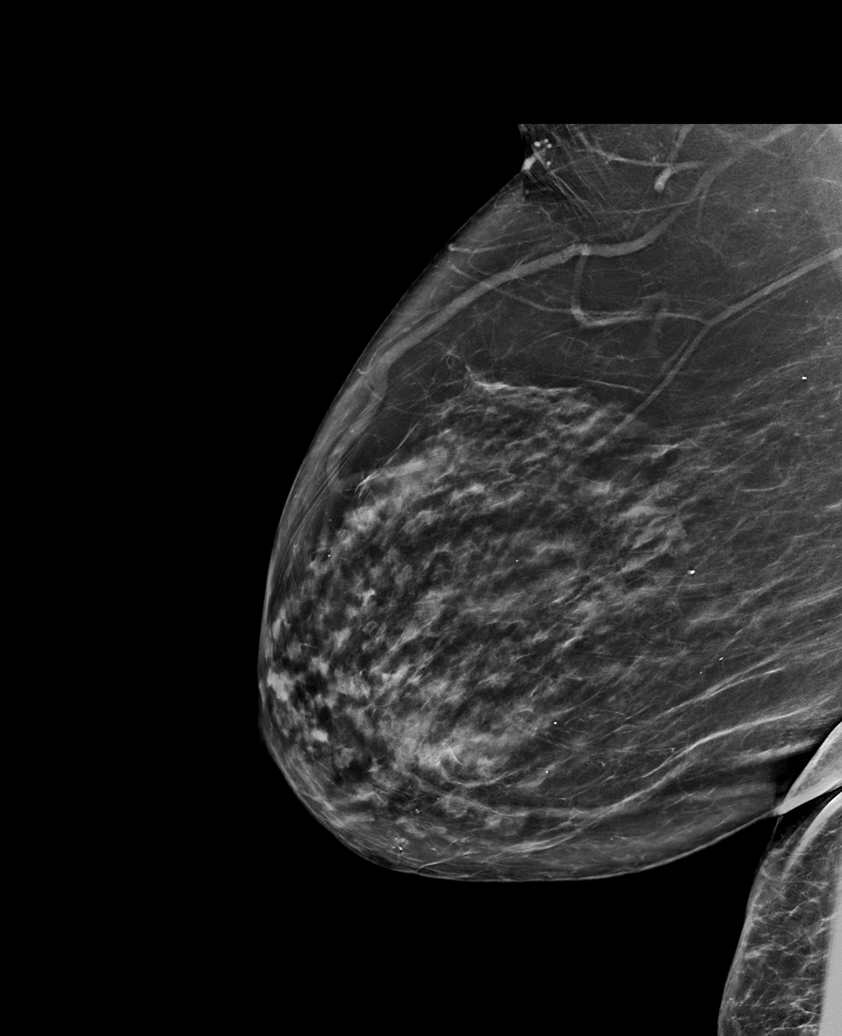

[R CC synth-2D]
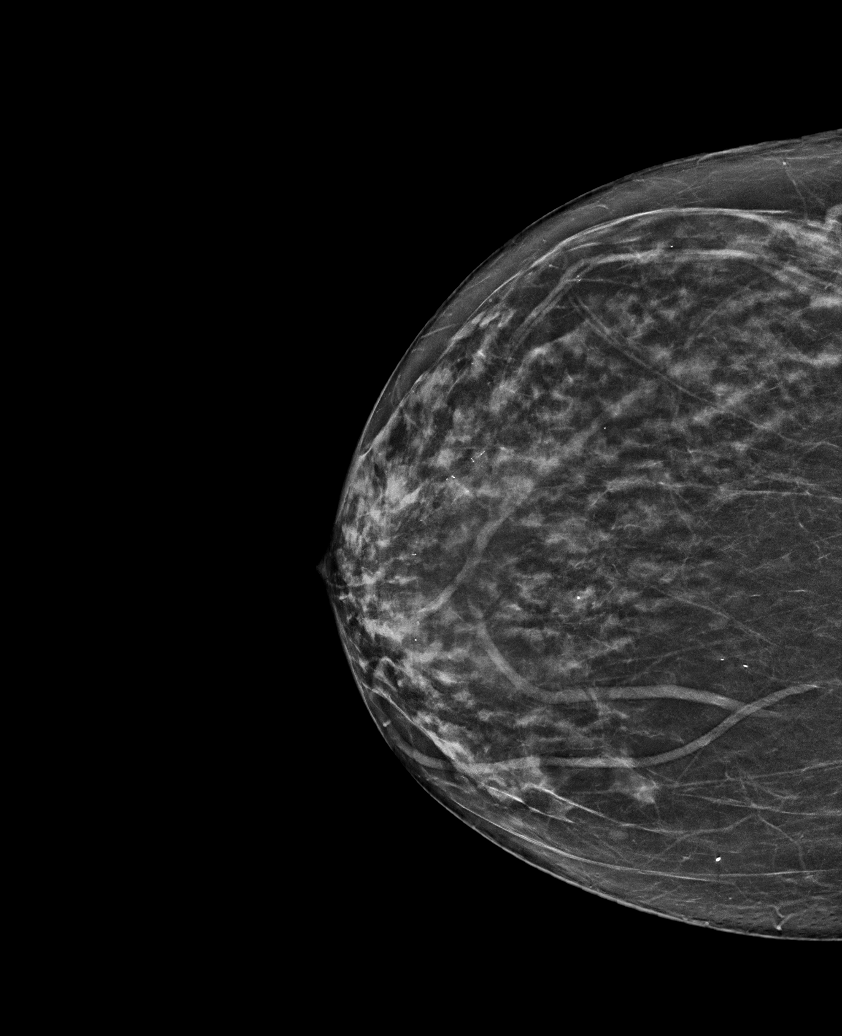

[R MLO synth-2D (2 of 2)]
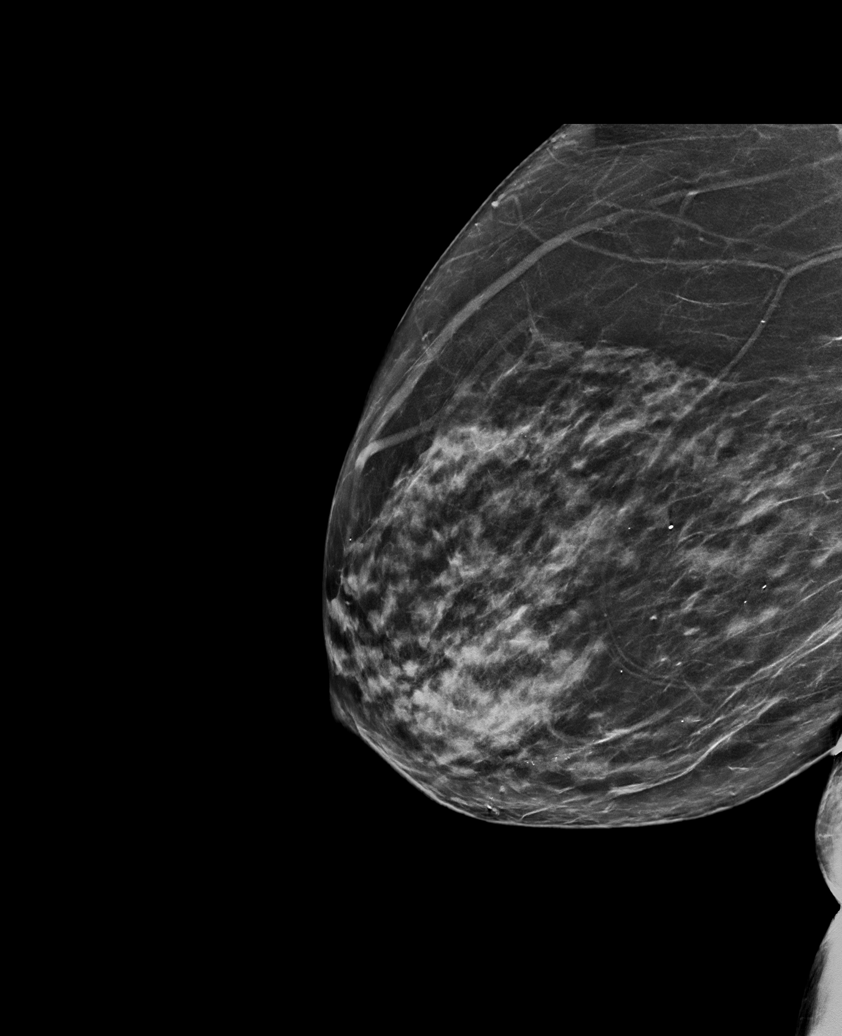

[L MLO synth-2D]
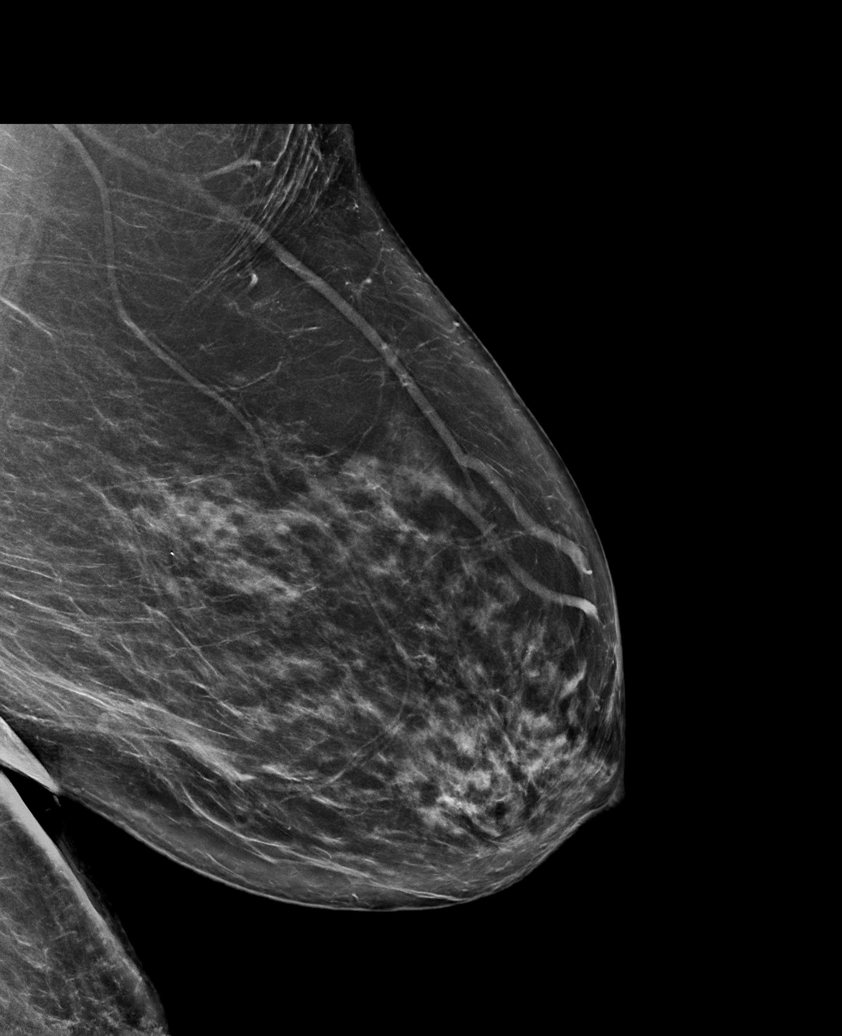

[R MLO tomo · tomo slice 41/81.0]
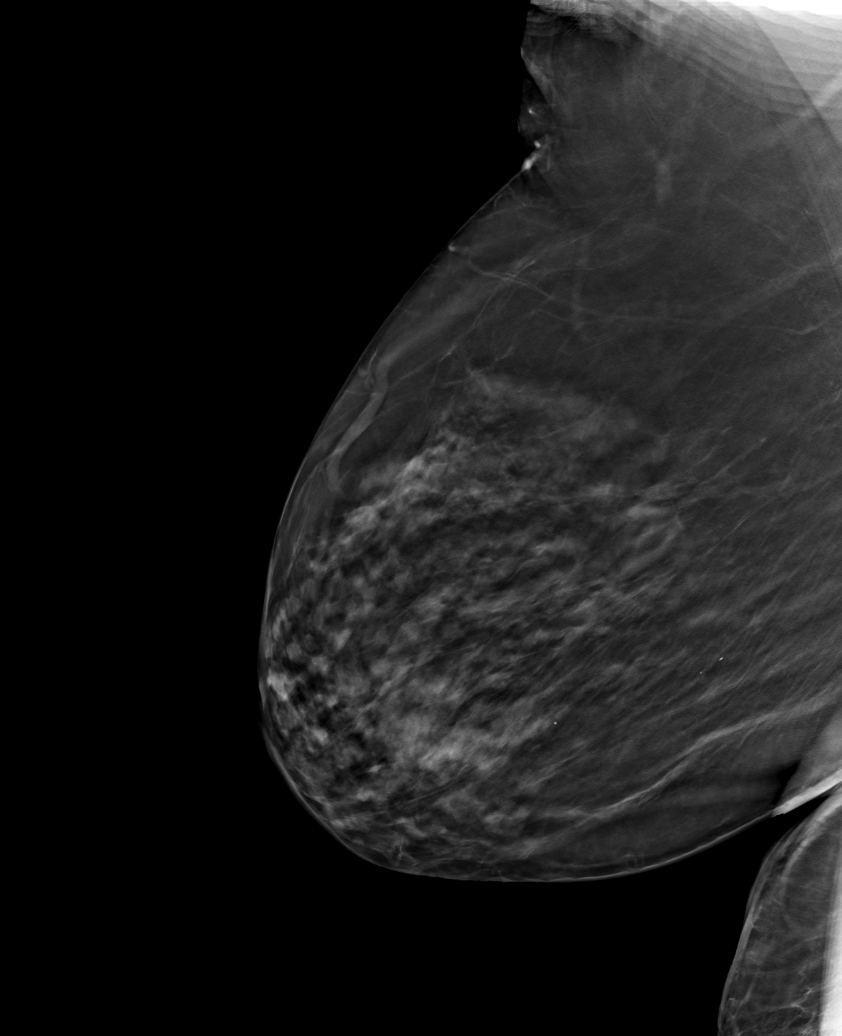

[6 of 30 positions shown; findings below may reference images not displayed]

ACR Breast Density Category c: The breast tissue is heterogeneously
dense, which may obscure small masses.
FINDINGS: There are no findings suspicious for malignancy. Images were
processed with CAD.
IMPRESSION: No mammographic evidence of malignancy. A result letter of this
screening mammogram will be mailed directly to the patient.

RECOMMENDATION:
Screening mammogram in one year. (Code:[id number]-[id number])

BI-RADS CATEGORY  1: Negative.

## 2021-07-22 ENCOUNTER — Ambulatory Visit: Payer: BLUE CROSS/BLUE SHIELD | Admitting: Podiatry

## 2021-08-12 ENCOUNTER — Ambulatory Visit (INDEPENDENT_AMBULATORY_CARE_PROVIDER_SITE_OTHER): Payer: MEDICARE | Admitting: Podiatry

## 2021-08-12 ENCOUNTER — Other Ambulatory Visit: Payer: Self-pay

## 2021-08-12 ENCOUNTER — Encounter: Payer: Self-pay | Admitting: Podiatry

## 2021-08-12 DIAGNOSIS — L84 Corns and callosities: Secondary | ICD-10-CM | POA: Diagnosis not present

## 2021-08-12 DIAGNOSIS — E1149 Type 2 diabetes mellitus with other diabetic neurological complication: Secondary | ICD-10-CM | POA: Diagnosis not present

## 2021-08-12 DIAGNOSIS — B351 Tinea unguium: Secondary | ICD-10-CM

## 2021-08-12 DIAGNOSIS — M79676 Pain in unspecified toe(s): Secondary | ICD-10-CM

## 2021-08-12 DIAGNOSIS — Q828 Other specified congenital malformations of skin: Secondary | ICD-10-CM | POA: Diagnosis not present

## 2021-08-12 NOTE — Progress Notes (Signed)
This patient presents to the office with chief complaint of long thick painful nails.  Patient says the nails are painful walking and wearing shoes.  This patient is unable to self treat.  This patient is unable to trim her nails since she is unable to reach her nails.  She presents to the office for preventative foot care services.    Patient is diabetic with neuropathy.    General Appearance  Alert, conversant and in no acute stress.  Vascular  Dorsalis pedis and posterior tibial  pulses are weakly  palpable  bilaterally.  Capillary return is within normal limits  bilaterally. Cold feet  Bilaterally.  Absent hair  B/L.  Neurologic  Senn-Weinstein monofilament wire test within normal limits  bilaterally. Muscle power within normal limits bilaterally.  Nails Thick disfigured discolored nails with subungual debris  from hallux to fifth toes bilaterally. No evidence of bacterial infection or drainage bilaterally.  Orthopedic  No limitations of motion  feet .  No crepitus or effusions noted.  No bony pathology or digital deformities noted.  Prominent metatarsal heads fourth B/L.    Skin  normotropic skin.  Heel callus  B/L. Symptomatic  porokeratosis/callus  sub 4th right foot.  No signs of infections or ulcers noted.     Onychomycosis  Nails  B/L.  Pain in right toes  Pain in left toes.  Callus right forefoot.  Debridement of nails both feet with nail nipper  followed trimming the nails with dremel tool.   Debride callus right forefoot with dremel tool.  Patient using gel dispersion pads daily   RTC 3 months.      DPM  

## 2021-10-28 DIAGNOSIS — M1A00X0 Idiopathic chronic gout, unspecified site, without tophus (tophi): Secondary | ICD-10-CM | POA: Insufficient documentation

## 2021-11-11 ENCOUNTER — Ambulatory Visit (INDEPENDENT_AMBULATORY_CARE_PROVIDER_SITE_OTHER): Payer: MEDICARE | Admitting: Podiatry

## 2021-11-11 ENCOUNTER — Encounter: Payer: Self-pay | Admitting: Podiatry

## 2021-11-11 DIAGNOSIS — M79676 Pain in unspecified toe(s): Secondary | ICD-10-CM

## 2021-11-11 DIAGNOSIS — B351 Tinea unguium: Secondary | ICD-10-CM

## 2021-11-11 DIAGNOSIS — E1149 Type 2 diabetes mellitus with other diabetic neurological complication: Secondary | ICD-10-CM | POA: Diagnosis not present

## 2021-11-11 DIAGNOSIS — Q828 Other specified congenital malformations of skin: Secondary | ICD-10-CM

## 2022-01-31 ENCOUNTER — Ambulatory Visit (INDEPENDENT_AMBULATORY_CARE_PROVIDER_SITE_OTHER): Payer: MEDICARE | Admitting: Internal Medicine

## 2022-01-31 VITALS — BP 132/68 | HR 83 | Resp 16 | Ht 61.0 in | Wt 188.0 lb

## 2022-01-31 DIAGNOSIS — G4733 Obstructive sleep apnea (adult) (pediatric): Secondary | ICD-10-CM | POA: Diagnosis not present

## 2022-01-31 DIAGNOSIS — I10 Essential (primary) hypertension: Secondary | ICD-10-CM

## 2022-01-31 DIAGNOSIS — Z9989 Dependence on other enabling machines and devices: Secondary | ICD-10-CM

## 2022-01-31 DIAGNOSIS — Z7189 Other specified counseling: Secondary | ICD-10-CM

## 2022-01-31 DIAGNOSIS — E669 Obesity, unspecified: Secondary | ICD-10-CM | POA: Diagnosis not present

## 2022-01-31 NOTE — Patient Instructions (Signed)

## 2022-01-31 NOTE — Progress Notes (Unsigned)
[facility] [address] [place], [place] [address]  Pulmonary Sleep Medicine   Office Visit Note  Patient Name: Cheryl Rios DOB: 13-Aug-1925 MRN [id number]    Chief Complaint: Obstructive Sleep Apnea visit  Brief History:  Cheryl Rios is seen today for an annual follow up on CPAP at 8 cmh20.  The patient has a 13 year history of sleep apnea. Patient is using PAP nightly.  The patient feels rested after sleeping with PAP.  The patient reports benefits from PAP use. Reported sleepiness is  improved and the Epworth Sleepiness Score is 16 out of 24. The patient does take naps, 2-3 times per week for 20-30 minutes. The patient complains of the following: No complaints.  The compliance download shows 100% compliance with an average use time of 7 hours 18 minutes. The AHI is 0.9.  The patient does not complain  of limb movements disrupting sleep.  ROS  General: (-) fever, (-) chills, (-) night sweat Nose and Sinuses: (-) nasal stuffiness or itchiness, (-) postnasal drip, (-) nosebleeds, (-) sinus trouble. Mouth and Throat: (-) sore throat, (-) hoarseness. Neck: (-) swollen glands, (-) enlarged thyroid, (-) neck pain. Respiratory: - cough, - shortness of breath, - wheezing. Neurologic: - numbness, - tingling. Psychiatric: - anxiety, - depression   Current Medication: Outpatient Encounter Medications as of 01/31/2022  Medication Sig   ACCU-CHEK SOFTCLIX LANCETS lancets USE 1 EACH ONCE DAILY. USE AS INSTRUCTED.   allopurinol (ZYLOPRIM) 100 MG tablet Take 100 mg by mouth daily.   allopurinol (ZYLOPRIM) 100 MG tablet Take 1.5 tablets by mouth daily.   Ascorbic Acid (VITAMIN C) 100 MG tablet Take 500 mg by mouth daily.    augmented betamethasone dipropionate (DIPROLENE-AF) 0.05 % cream APPLY TO AFFECTED AREA ON ARMS TWICE A DAY UNTIL CLEAR   B Complex Vitamins (VITAMIN B-COMPLEX) TABS Take by mouth.   Cholecalciferol (VITAMIN D3) 1000 units CAPS Take by mouth.   Cinnamon 500 MG  capsule Take 1,000 mg by mouth 2 (two) times daily.    Cobalamin Combinations (VITAMIN B12-FOLIC ACID) 500-400 MCG TABS    cyanocobalamin (,VITAMIN B-12,) 1000 MCG/ML injection Inject into the muscle.   fish oil-omega-3 fatty acids 1000 MG capsule Take 1,200 mg by mouth daily.   fluticasone (FLONASE) 50 MCG/ACT nasal spray Place into the nose.   FOLIC ACID PO Take by mouth.   furosemide (LASIX) 40 MG tablet Take 40 mg by mouth daily. In am.   gabapentin (NEURONTIN) 600 MG tablet Take 1 tablet (600 mg total) by mouth daily after supper.   glucose blood test strip TEST BLOOD SUGAR ONCE DAILY   hydrALAZINE (APRESOLINE) 50 MG tablet TAKE 1 TABLET (50 MG TOTAL) BY MOUTH ONCE DAILY.   hydroxychloroquine (PLAQUENIL) 200 MG tablet Take 200 mg by mouth daily.   ketoconazole (NIZORAL) 2 % cream Apply 1 application topically daily.   levothyroxine (SYNTHROID, LEVOTHROID) 75 MCG tablet TAKE 1 TABLET BY MOUTH ONCE DAILY ON EMPTY STOMACH WITH WATER 30-60 MIN BEFORE BREAKFAST   lisinopril (PRINIVIL,ZESTRIL) 20 MG tablet TAKE 1 TABLET (20 MG TOTAL) BY MOUTH ONCE DAILY.   meloxicam (MOBIC) 7.5 MG tablet    metoprolol succinate (TOPROL-XL) 25 MG 24 hr tablet TAKE 1 TABLET BY MOUTH EVERY DAY   Multiple Vitamins-Minerals (WOMENS 50+ MULTI VITAMIN/MIN PO) Take 1 tablet by mouth daily.    mupirocin ointment (BACTROBAN) 2 % APPLY TO AFFECTED AREA ONCE DAILY FOR 7 DAYS   nystatin cream (MYCOSTATIN) Apply topically.  potassium chloride (KLOR-CON) 10 MEQ tablet TAKE 1 TABLET BY MOUTH ONCE A DAY   triamcinolone cream (KENALOG) 0.5 % APPLY TO AFFECTED AREA TWICE A DAY   vitamin C (ASCORBIC ACID) 500 MG tablet Take by mouth.   vitamin E 400 UNIT capsule Take by mouth.   No facility-administered encounter medications on file as of 01/31/2022.    Surgical History: Past Surgical History:  Procedure Laterality Date   ABDOMINAL HYSTERECTOMY     CATARACT EXTRACTION W/PHACO Right 07/19/2016   Procedure: CATARACT  EXTRACTION PHACO AND INTRAOCULAR LENS PLACEMENT (IOC);  Surgeon: Leandrew Koyanagi, MD;  Location: [facility] ORS;  Service: Ophthalmology;  Laterality: Right;  [place] 0:55.8 AP% 14.3 CDE 9.52 Fluid pack lot # [id number]   CATARACT EXTRACTION W/PHACO Left 08/18/2016   Procedure: CATARACT EXTRACTION PHACO AND INTRAOCULAR LENS PLACEMENT (IOC);  Surgeon: Leandrew Koyanagi, MD;  Location: [facility] ORS;  Service: Ophthalmology;  Laterality: Left;  US1:03.1 AP%20.6 CDE13.2 Fluid pack lot # [id number]   CHOLECYSTECTOMY     ERCP N/A 02/07/2017   Procedure: ENDOSCOPIC RETROGRADE CHOLANGIOPANCREATOGRAPHY (ERCP);  Surgeon: Lucilla Lame, MD;  Location: [facility];  Service: Endoscopy;  Laterality: N/A;   GALLBLADDER SURGERY     HEMORRHOID SURGERY     JOINT REPLACEMENT Bilateral    KNEE REPLACEMENT   KNEE SURGERY Right     Medical History: Past Medical History:  Diagnosis Date   Arrhythmia    Arthritis    Diabetes mellitus without complication ([place])    Diverticulosis    Dysrhythmia    Edema of both feet    HOH (hard of hearing)    Hypertension    Hypothyroidism    Neuropathy    Osteoporosis    Sleep apnea    OSA--Use C-PAP   Thyroid disease     Family History: Non contributory to the present illness  Social History: Social History   Socioeconomic History   Marital status: Widowed    Spouse name: Not on file   Number of children: Not on file   Years of education: Not on file   Highest education level: Not on file  Occupational History   Not on file  Tobacco Use   Smoking status: Never   Smokeless tobacco: Never  Vaping Use   Vaping Use: Never used  Substance and Sexual Activity   Alcohol use: No   Drug use: No   Sexual activity: Never  Other Topics Concern   Not on file  Social History Narrative   Not on file   Social Determinants of Health   Financial Resource Strain: Not on file  Food Insecurity: Not on file  Transportation Needs: Not on file  Physical Activity: Not on  file  Stress: Not on file  Social Connections: Not on file  Intimate Partner Violence: Not on file    Vital Signs: Blood pressure 132/68, pulse 83, resp. rate 16, height 5\' 1"  (1.549 m), weight 188 lb (85.3 kg), SpO2 95 %. Body mass index is 35.52 kg/m.    Examination: General Appearance: The patient is well-developed, well-nourished, and in no distress. Neck Circumference: 46 cm Skin: Gross inspection of skin unremarkable. Head: normocephalic, no gross deformities. Eyes: no gross deformities noted. ENT: ears appear grossly normal Neurologic: Alert and oriented. No involuntary movements.  STOP BANG RISK ASSESSMENT S (snore) Have you been told that you snore?     NO   T (tired) Are you often tired, fatigued, or sleepy during the day?   NO  O (obstruction)  Do you stop breathing, choke, or gasp during sleep? NO   P (pressure) Do you have or are you being treated for high blood pressure? YES   B (BMI) Is your body index greater than 35 kg/m? YES   A (age) Are you 86 years old or older? YES   N (neck) Do you have a neck circumference greater than 16 inches?   YES   G (gender) Are you a female? NO   TOTAL STOP/BANG "YES" ANSWERS 4       A STOP-Bang score of 2 or less is considered low risk, and a score of 5 or more is high risk for having either moderate or severe OSA. For people who score 3 or 4, doctors may need to perform further assessment to determine how likely they are to have OSA.         EPWORTH SLEEPINESS SCALE:  Scale:  (0)= no chance of dozing; (1)= slight chance of dozing; (2)= moderate chance of dozing; (3)= high chance of dozing  Chance  Situtation    Sitting and reading: 3    Watching TV: 3    Sitting Inactive in public: 2    As a passenger in car: 2      Lying down to rest: 3    Sitting and talking: 1    Sitting quielty after lunch: 2    In a car, stopped in traffic: 0   TOTAL SCORE:   16 out of 24    SLEEP STUDIES:  SPLIT (09/14/19)   AHI 81.9, SPO2 66%, CPAP at 8 cmh20   CPAP COMPLIANCE DATA:  Date Range: 01/27/21 - 01/26/22  Average Daily Use: 7 hours 18 minutes  Median Use: 7 hours 25 minutes  Compliance for > 4 Hours: 364 days  AHI: 0.9 respiratory events per hour  Days Used: 365/365  Mask Leak: 35.4  95th Percentile Pressure: 8 cmh20         LABS: No results found for this or any previous visit (from the past 2160 hour(s)).  Radiology: MM 3D SCREEN BREAST BILATERAL  Result Date: 04/28/2020 CLINICAL DATA:  Screening. EXAM: DIGITAL SCREENING BILATERAL MAMMOGRAM WITH TOMO AND CAD COMPARISON:  Previous exam(s). ACR Breast Density Category c: The breast tissue is heterogeneously dense, which may obscure small masses. FINDINGS: There are no findings suspicious for malignancy. Images were processed with CAD. IMPRESSION: No mammographic evidence of malignancy. A result letter of this screening mammogram will be mailed directly to the patient. RECOMMENDATION: Screening mammogram in one year. (Code:[id number]) BI-RADS CATEGORY  1: Negative. Electronically Signed   By: Bary Richard M.D.   On: 04/28/2020 16:07    No results found.  No results found.    Assessment and Plan: Patient Active Problem List   Diagnosis Date Noted   OSA on CPAP 02/01/2021   CPAP use counseling 02/01/2021   Hypertension 02/01/2021   Obesity (BMI 30-39.9) 02/01/2021   Callus 07/13/2020   Pedal edema 11/08/2017   Choledocholithiasis    Sepsis (HCC) 02/05/2017   Fever 02/04/2017   Weakness 02/04/2017   Failure to thrive in adult 02/04/2017   OA (osteoarthritis) 02/04/2017   Status post total left knee replacement 09/19/2016   Osteoporosis 10/04/2013   Elevated LFTs 01/23/2011    1. OSA on CPAP The patient does tolerate PAP and reports  benefit from PAP use. The patient was reminded how to clean equipment and advised to replace supplies routinely The patient was also counselled on weight loss. The  compliance is  excellent. The AHI is 0.9.   OSA on cpap- CPAP continues to be medically necessary to treat this patient's OSA.  Continue with excellent compliance. F/u one year.   2. CPAP use counseling CPAP Counseling: had a lengthy discussion with the patient regarding the importance of PAP therapy in management of the sleep apnea. Patient appears to understand the risk factor reduction and also understands the risks associated with untreated sleep apnea. Patient will try to make a good faith effort to remain compliant with therapy. Also instructed the patient on proper cleaning of the device including the water must be changed daily if possible and use of distilled water is preferred. Patient understands that the machine should be regularly cleaned with appropriate recommended cleaning solutions that do not damage the PAP machine for example given white vinegar and water rinses. Other methods such as ozone treatment may not be as good as these simple methods to achieve cleaning.   3. Hypertension, unspecified type Hypertension Counseling:   The following hypertensive lifestyle modification were recommended and discussed:  1. Limiting alcohol intake to less than 1 oz/day of ethanol:(24 oz of beer or 8 oz of wine or 2 oz of 100-proof whiskey). 2. Take baby ASA 81 mg daily. 3. Importance of regular aerobic exercise and losing weight. 4. Reduce dietary saturated fat and cholesterol intake for overall cardiovascular health. 5. Maintaining adequate dietary potassium, calcium, and magnesium intake. 6. Regular monitoring of the blood pressure. 7. Reduce sodium intake to less than 100 mmol/day (less than 2.3 gm of sodium or less than 6 gm of sodium choride)    4. Obesity (BMI 30-39.9) Obesity Counseling: Had a lengthy discussion regarding patients BMI and weight issues. Patient was instructed on portion control as well as increased activity. Also discussed caloric restrictions with trying to maintain intake less than  2000 Kcal. Discussions were made in accordance with the 5As of weight management. Simple actions such as not eating late and if able to, taking a walk is suggested.    General Counseling: I have discussed the findings of the evaluation and examination with Alliyah.  I have also discussed any further diagnostic evaluation thatmay be needed or ordered today. Amarya verbalizes understanding of the findings of todays visit. We also reviewed her medications today and discussed drug interactions and side effects including but not limited excessive drowsiness and altered mental states. We also discussed that there is always a risk not just to her but also people around her. she has been encouraged to call the office with any questions or concerns that should arise related to todays visit.  No orders of the defined types were placed in this encounter.       I have personally obtained a history, examined the patient, evaluated laboratory and imaging results, formulated the assessment and plan and placed orders. This patient was seen today by Emmaline Kluver, PA-C in collaboration with Dr. Freda Munro.   Yevonne Pax, MD [facility] Diplomate ABMS Pulmonary Critical Care Medicine and Sleep Medicine

## 2022-02-14 ENCOUNTER — Encounter: Payer: Self-pay | Admitting: Podiatry

## 2022-02-14 ENCOUNTER — Ambulatory Visit (INDEPENDENT_AMBULATORY_CARE_PROVIDER_SITE_OTHER): Payer: MEDICARE | Admitting: Podiatry

## 2022-02-14 DIAGNOSIS — Q828 Other specified congenital malformations of skin: Secondary | ICD-10-CM | POA: Diagnosis not present

## 2022-02-14 DIAGNOSIS — E1149 Type 2 diabetes mellitus with other diabetic neurological complication: Secondary | ICD-10-CM | POA: Diagnosis not present

## 2022-02-14 DIAGNOSIS — M79676 Pain in unspecified toe(s): Secondary | ICD-10-CM | POA: Diagnosis not present

## 2022-02-14 DIAGNOSIS — B351 Tinea unguium: Secondary | ICD-10-CM

## 2022-02-14 NOTE — Progress Notes (Signed)
This patient presents to the office with chief complaint of long thick painful nails.  Patient says the nails are painful walking and wearing shoes.  This patient is unable to self treat.  This patient is unable to trim her nails since she is unable to reach her nails.  She presents to the office for preventative foot care services.    Patient is diabetic with neuropathy.    General Appearance  Alert, conversant and in no acute stress.  Vascular  Dorsalis pedis and posterior tibial  pulses are weakly  palpable  bilaterally.  Capillary return is within normal limits  bilaterally. Cold feet  Bilaterally.  Absent hair  B/L.  Neurologic  Senn-Weinstein monofilament wire test within normal limits  bilaterally. Muscle power within normal limits bilaterally.  Nails Thick disfigured discolored nails with subungual debris  from hallux to fifth toes bilaterally. No evidence of bacterial infection or drainage bilaterally.  Orthopedic  No limitations of motion  feet .  No crepitus or effusions noted.  No bony pathology or digital deformities noted.  Prominent metatarsal heads fourth B/L.    Skin  normotropic skin.  Heel callus  B/L. Symptomatic  porokeratosis/callus  sub 4th right foot.  No signs of infections or ulcers noted.     Onychomycosis  Nails  B/L.  Pain in right toes  Pain in left toes.  Callus right forefoot.  Debridement of nails both feet with nail nipper  followed trimming the nails with dremel tool.   Debride callus right forefoot with dremel tool.  Patient using gel dispersion pads daily   RTC 3 months.    Gardiner Barefoot DPM

## 2022-02-21 ENCOUNTER — Ambulatory Visit
Admission: RE | Admit: 2022-02-21 | Discharge: 2022-02-21 | Disposition: A | Discharge status: Home / Self Care | Payer: MEDICARE | Source: Ambulatory Visit | Attending: Family Medicine | Admitting: Family Medicine

## 2022-02-21 ENCOUNTER — Other Ambulatory Visit: Payer: Self-pay | Admitting: Family Medicine

## 2022-02-21 DIAGNOSIS — R600 Localized edema: Secondary | ICD-10-CM | POA: Insufficient documentation

## 2022-05-09 ENCOUNTER — Ambulatory Visit (INDEPENDENT_AMBULATORY_CARE_PROVIDER_SITE_OTHER): Payer: MEDICARE | Admitting: Podiatry

## 2022-05-09 DIAGNOSIS — E1149 Type 2 diabetes mellitus with other diabetic neurological complication: Secondary | ICD-10-CM

## 2022-05-09 DIAGNOSIS — M79676 Pain in unspecified toe(s): Secondary | ICD-10-CM | POA: Diagnosis not present

## 2022-05-09 DIAGNOSIS — B351 Tinea unguium: Secondary | ICD-10-CM | POA: Diagnosis not present

## 2022-05-09 NOTE — Progress Notes (Signed)
This patient presents to the office with chief complaint of long thick painful nails.  Patient says the nails are painful walking and wearing shoes.  This patient is unable to self treat.  This patient is unable to trim her nails since she is unable to reach her nails.  She presents to the office for preventative foot care services.    Patient is diabetic with neuropathy.    General Appearance  Alert, conversant and in no acute stress.  Vascular  Dorsalis pedis and posterior tibial  pulses are weakly  palpable  bilaterally.  Capillary return is within normal limits  bilaterally. Cold feet  Bilaterally.  Absent hair  B/L.  Neurologic  Senn-Weinstein monofilament wire test within normal limits  bilaterally. Muscle power within normal limits bilaterally.  Nails Thick disfigured discolored nails with subungual debris  from hallux to fifth toes bilaterally. No evidence of bacterial infection or drainage bilaterally.  Orthopedic  No limitations of motion  feet .  No crepitus or effusions noted.  No bony pathology or digital deformities noted.  Prominent metatarsal heads fourth B/L.    Skin  normotropic skin.  Heel callus  B/L. Symptomatic  porokeratosis/callus  sub 4th right foot.  No signs of infections or ulcers noted.     Onychomycosis  Nails  B/L.  Pain in right toes  Pain in left toes.  Callus right forefoot.  Debridement of nails both feet with nail nipper  followed trimming the nails with dremel tool.   Debride callus right forefoot with dremel tool.  Patient using gel dispersion pads daily   RTC 3 months.    Helane Gunther DPM

## 2022-08-08 ENCOUNTER — Encounter: Payer: Self-pay | Admitting: Podiatry

## 2022-08-15 ENCOUNTER — Ambulatory Visit: Payer: MEDICARE | Admitting: Podiatry

## 2022-08-29 ENCOUNTER — Encounter: Payer: Self-pay | Admitting: Podiatry

## 2022-08-29 ENCOUNTER — Ambulatory Visit (INDEPENDENT_AMBULATORY_CARE_PROVIDER_SITE_OTHER): Payer: MEDICARE | Admitting: Podiatry

## 2022-08-29 ENCOUNTER — Ambulatory Visit: Payer: MEDICARE | Admitting: Podiatry

## 2022-08-29 VITALS — BP 131/66 | HR 87

## 2022-08-29 DIAGNOSIS — L853 Xerosis cutis: Secondary | ICD-10-CM | POA: Diagnosis not present

## 2022-08-29 DIAGNOSIS — L84 Corns and callosities: Secondary | ICD-10-CM | POA: Diagnosis not present

## 2022-08-29 DIAGNOSIS — B351 Tinea unguium: Secondary | ICD-10-CM

## 2022-08-29 DIAGNOSIS — E1149 Type 2 diabetes mellitus with other diabetic neurological complication: Secondary | ICD-10-CM | POA: Diagnosis not present

## 2022-08-29 MED ORDER — AMMONIUM LACTATE 12 % EX CREA: 1.0000 | TOPICAL_CREAM | CUTANEOUS | 2 refills | Status: AC | PRN

## 2022-08-29 NOTE — Progress Notes (Signed)
  Subjective:  Patient ID: Cheryl Rios, female    DOB: 18-Mar-1926,  MRN: [id number]  Chief Complaint  Patient presents with   Callouses    "My feet are killing me, my calluses."    89 y.o. female presents with the above complaint. History confirmed with patient.  Nails are thickened and elongated.  Calluses are thickened.  Both are causing pain.  Prior debridements have been helpful.  Objective:  Physical Exam: warm, good capillary refill, no trophic changes or ulcerative lesions, normal DP and PT pulses, normal sensory exam, and bilateral fifth metatarsal callus  left Foot: dystrophic yellowed discolored nail plates with subungual debris Right Foot: dystrophic yellowed discolored nail plates with subungual debris   Assessment:   1. Pain due to onychomycosis of toenail   2. Type II diabetes mellitus with neurological manifestations   3. Callus   4. Xerosis cutis      Plan:  Patient was evaluated and treated and all questions answered.  Discussed the etiology and treatment options for the condition in detail with the patient. Educated patient on the topical and oral treatment options for mycotic nails. Recommended debridement of the nails today. Sharp and mechanical debridement performed of all painful and mycotic nails today. Nails debrided in length and thickness using a nail nipper to level of comfort. Discussed treatment options including appropriate shoe gear. Follow up as needed for painful nails.   Calluses debrided as a courtesy, recommended ammonium lactate cream daily to moisturize skin and calluses to promote exfoliation.  Rx sent to pharmacy  Return in about 3 months (around 11/28/2022) for RFC.

## 2022-09-27 DIAGNOSIS — L97921 Non-pressure chronic ulcer of unspecified part of left lower leg limited to breakdown of skin: Secondary | ICD-10-CM | POA: Insufficient documentation

## 2022-12-05 ENCOUNTER — Encounter: Payer: Self-pay | Admitting: Podiatry

## 2022-12-05 ENCOUNTER — Ambulatory Visit (INDEPENDENT_AMBULATORY_CARE_PROVIDER_SITE_OTHER): Payer: MEDICARE | Admitting: Podiatry

## 2022-12-05 VITALS — BP 129/57 | HR 94

## 2022-12-05 DIAGNOSIS — E1149 Type 2 diabetes mellitus with other diabetic neurological complication: Secondary | ICD-10-CM

## 2022-12-05 DIAGNOSIS — M79676 Pain in unspecified toe(s): Secondary | ICD-10-CM | POA: Diagnosis not present

## 2022-12-05 DIAGNOSIS — B353 Tinea pedis: Secondary | ICD-10-CM

## 2022-12-05 DIAGNOSIS — Q828 Other specified congenital malformations of skin: Secondary | ICD-10-CM

## 2022-12-05 DIAGNOSIS — E119 Type 2 diabetes mellitus without complications: Secondary | ICD-10-CM

## 2022-12-05 DIAGNOSIS — B351 Tinea unguium: Secondary | ICD-10-CM | POA: Diagnosis not present

## 2022-12-05 MED ORDER — KETOCONAZOLE 2 % EX CREA
1.0000 | TOPICAL_CREAM | Freq: Every day | CUTANEOUS | 2 refills | Status: AC
Start: 2022-12-05 — End: ?

## 2022-12-05 NOTE — Patient Instructions (Addendum)
 To prevent reinfection, spray shoes with lysol every evening.  Clean tub or shower with bleach based cleanser.  Athlete's Foot Athlete's foot (tinea pedis) is a fungal infection of the skin on your feet. It often occurs on the skin that is between or underneath the toes. It can also occur on the soles of your feet. The infection can spread from person to person (is contagious). It can also spread when a person's bare feet come in contact with the fungus on shower floors or on items such as shoes. What are the causes? This condition is caused by a fungus that grows in warm, moist places. You can get athlete's foot by sharing shoes, shower stalls, towels, and wet floors with someone who is infected. Not washing your feet or changing your socks often enough can also lead to athlete's foot. What increases the risk? This condition is more likely to develop in: Men. People who have a weak body defense system (immune system). People who have diabetes. People who use public showers, such as at a gym. People who wear heavy-duty shoes, such as industrial or military shoes. Seasons with warm, humid weather. What are the signs or symptoms? Symptoms of this condition include: Itchy areas between your toes or on the soles of your feet. White, flaky, or scaly areas between your toes or on the soles of your feet. Very itchy small blisters between your toes or on the soles of your feet. Small cuts in your skin. These cuts can become infected. Thick or discolored toenails. How is this diagnosed? This condition may be diagnosed with a physical exam and a review of your medical history. Your health care provider may also take a skin or toenail sample to examine under a microscope. How is this treated? This condition is treated with antifungal medicines. These may be applied as powders, ointments, or creams. In severe cases, an oral antifungal medicine may be given. Follow these instructions at  home: Medicines Apply or take over-the-counter and prescription medicines only as told by your health care provider. Apply your antifungal medicine as told by your health care provider. Do not stop using the antifungal even if your condition improves. Foot care Do not scratch your feet. Keep your feet dry: Wear cotton or wool socks. Change your socks every day or if they become wet. Wear shoes that allow air to flow, such as sandals or canvas tennis shoes. Wash and dry your feet, including the area between your toes. Also, wash and dry your feet: Every day or as told by your health care provider. After exercising. General instructions Do not let others use towels, shoes, nail clippers, or other personal items that touch your feet. Protect your feet by wearing sandals in wet areas, such as locker rooms and shared showers. Keep all follow-up visits. This is important. If you have diabetes, keep your blood sugar under control. Contact a health care provider if: You have a fever. You have swelling, soreness, warmth, or redness in your foot. Your feet are not getting better with treatment. Your symptoms get worse. You have new symptoms. You have severe pain. Summary Athlete's foot (tinea pedis) is a fungal infection of the skin on your feet. It often occurs on skin that is between or underneath the toes. This condition is caused by a fungus that grows in warm, moist places. Symptoms include white, flaky, or scaly areas between your toes or on the soles of your feet. This condition is treated with antifungal medicines.   Keep your feet clean. Always dry them thoroughly. This information is not intended to replace advice given to you by your health care provider. Make sure you discuss any questions you have with your health care provider. Document Revised: 08/30/2020 Document Reviewed: 08/30/2020 Elsevier Patient Education  2024 [employer]  

## 2022-12-08 NOTE — Progress Notes (Signed)
ANNUAL DIABETIC FOOT EXAM  Subjective: Cheryl Rios presents today annual diabetic foot exam.  Chief Complaint  Patient presents with   Diabetes    "Toenails"   Patient confirms h/o diabetes.  Patient has h/o foot/leg ulcer of left lower extremity.  Patient has been diagnosed with neuropathy.  Risk factors: diabetes, diabetic neuropathy, history of gout, chronic lower extremity edema, HTN.  Jerl Mina, MD is patient's PCP.  Past Medical History:  Diagnosis Date   Arrhythmia    Arthritis    Diabetes mellitus without complication (HCC)    Diverticulosis    Dysrhythmia    Edema of both feet    HOH (hard of hearing)    Hypertension    Hypothyroidism    Neuropathy    Osteoporosis    Sleep apnea    OSA--Use C-PAP   Thyroid disease    Patient Active Problem List   Diagnosis Date Noted   Non-pressure chronic ulcer of unspecified part of left lower leg limited to breakdown of skin (HCC) 09/27/2022   Chronic gouty arthritis 10/28/2021   OSA on CPAP 02/01/2021   CPAP use counseling 02/01/2021   Hypertension 02/01/2021   Obesity (BMI 30-39.9) 02/01/2021   Callus 07/13/2020   Pedal edema 11/08/2017   Choledocholithiasis    Sepsis (HCC) 02/05/2017   Fever 02/04/2017   Weakness 02/04/2017   Failure to thrive in adult 02/04/2017   OA (osteoarthritis) 02/04/2017   Status post total left knee replacement 09/19/2016   Osteoporosis 10/04/2013   Elevated LFTs 01/23/2011   Past Surgical History:  Procedure Laterality Date   ABDOMINAL HYSTERECTOMY     CATARACT EXTRACTION W/PHACO Right 07/19/2016   Procedure: CATARACT EXTRACTION PHACO AND INTRAOCULAR LENS PLACEMENT (IOC);  Surgeon: Lockie Mola, MD;  Location: [facility] ORS;  Service: Ophthalmology;  Laterality: Right;  [place] 0:55.8 AP% 14.3 CDE 9.52 Fluid pack lot # [id number]   CATARACT EXTRACTION W/PHACO Left 08/18/2016   Procedure: CATARACT EXTRACTION PHACO AND INTRAOCULAR LENS PLACEMENT (IOC);  Surgeon: Lockie Mola, MD;  Location: [facility] ORS;  Service: Ophthalmology;  Laterality: Left;  US1:03.1 AP%20.6 CDE13.2 Fluid pack lot # [id number]   CHOLECYSTECTOMY     ERCP N/A 02/07/2017   Procedure: ENDOSCOPIC RETROGRADE CHOLANGIOPANCREATOGRAPHY (ERCP);  Surgeon: Midge Minium, MD;  Location: [facility] ENDOSCOPY;  Service: Endoscopy;  Laterality: N/A;   GALLBLADDER SURGERY     HEMORRHOID SURGERY     JOINT REPLACEMENT Bilateral    KNEE REPLACEMENT   KNEE SURGERY Right    Current Outpatient Medications on File Prior to Visit  Medication Sig Dispense Refill   ACCU-CHEK SOFTCLIX LANCETS lancets USE 1 EACH ONCE DAILY. USE AS INSTRUCTED.     allopurinol (ZYLOPRIM) 100 MG tablet Take 100 mg by mouth daily.     allopurinol (ZYLOPRIM) 100 MG tablet Take 1.5 tablets by mouth daily.     ammonium lactate (AMLACTIN) 12 % cream Apply 1 Application topically as needed for dry skin. 385 g 2   Ascorbic Acid (VITAMIN C) 100 MG tablet Take 500 mg by mouth daily.      augmented betamethasone dipropionate (DIPROLENE-AF) 0.05 % cream APPLY TO AFFECTED AREA ON ARMS TWICE A DAY UNTIL CLEAR     B Complex Vitamins (VITAMIN B-COMPLEX) TABS Take by mouth.     Cholecalciferol (VITAMIN D3) 1000 units CAPS Take by mouth.     Cinnamon 500 MG capsule Take 1,000 mg by mouth 2 (two) times daily.      Cobalamin Combinations (VITAMIN B12-FOLIC ACID) 500-400 MCG  TABS      cyanocobalamin (,VITAMIN B-12,) 1000 MCG/ML injection Inject into the muscle.     fish oil-omega-3 fatty acids 1000 MG capsule Take 1,200 mg by mouth daily.     FOLIC ACID PO Take by mouth.     furosemide (LASIX) 40 MG tablet Take 40 mg by mouth daily. In am.     gabapentin (NEURONTIN) 600 MG tablet Take 1 tablet (600 mg total) by mouth daily after supper. 90 tablet 3   glucose blood test strip TEST BLOOD SUGAR ONCE DAILY     hydrALAZINE (APRESOLINE) 50 MG tablet TAKE 1 TABLET (50 MG TOTAL) BY MOUTH ONCE DAILY.     hydroxychloroquine (PLAQUENIL) 200 MG tablet Take 200 mg  by mouth daily.     levothyroxine (SYNTHROID, LEVOTHROID) 75 MCG tablet TAKE 1 TABLET BY MOUTH ONCE DAILY ON EMPTY STOMACH WITH WATER 30-60 MIN BEFORE BREAKFAST     lisinopril (PRINIVIL,ZESTRIL) 20 MG tablet TAKE 1 TABLET (20 MG TOTAL) BY MOUTH ONCE DAILY.     meloxicam (MOBIC) 7.5 MG tablet      metoprolol succinate (TOPROL-XL) 25 MG 24 hr tablet TAKE 1 TABLET BY MOUTH EVERY DAY     Multiple Vitamins-Minerals (WOMENS 50+ MULTI VITAMIN/MIN PO) Take 1 tablet by mouth daily.      mupirocin ointment (BACTROBAN) 2 % APPLY TO AFFECTED AREA ONCE DAILY FOR 7 DAYS  0   nystatin cream (MYCOSTATIN) Apply topically.     potassium chloride (KLOR-CON) 10 MEQ tablet TAKE 1 TABLET BY MOUTH ONCE A DAY     triamcinolone cream (KENALOG) 0.5 % APPLY TO AFFECTED AREA TWICE A DAY     vitamin C (ASCORBIC ACID) 500 MG tablet Take by mouth.     vitamin E 400 UNIT capsule Take by mouth.     fluticasone (FLONASE) 50 MCG/ACT nasal spray Place into the nose.     No current facility-administered medications on file prior to visit.    No Known Allergies Social History   Occupational History   Not on file  Tobacco Use   Smoking status: Never   Smokeless tobacco: Never  Vaping Use   Vaping status: Never Used  Substance and Sexual Activity   Alcohol use: No   Drug use: No   Sexual activity: Never   Family History  Problem Relation Age of Onset   Breast cancer Neg Hx    Immunization History  Administered Date(s) Administered   Fluad Quad(high Dose 65+) 02/04/2019   Influenza, High Dose Seasonal PF 02/06/2017, 02/21/2018   Influenza-Unspecified 03/21/2012, 03/13/2013, 03/14/2014, 03/13/2015, 01/26/2016   Moderna Sars-Covid-2 Vaccination 06/15/2019, 07/13/2019   Pneumococcal Conjugate-13 12/18/2017, 01/05/2019   Pneumococcal Polysaccharide-23 01/04/2018, 01/04/2019   Zoster Recombinant(Shingrix) 12/18/2017, 05/15/2018     Review of Systems: Negative except as noted in the HPI.   Objective: Vitals:    12/05/22 0834  BP: (!) 129/57  Pulse: 94    Cheryl Rios is a pleasant 89 y.o. female in NAD. AAO X 3.  Vascular Examination: Capillary refill time immediate b/l. Vascular status intact b/l with palpable pedal pulses. Pedal hair absent b/l. No pain with calf compression b/l. Skin temperature gradient WNL b/l. No cyanosis or clubbing b/l. No ischemia or gangrene noted b/l. Dependent edema noted b/l LE.  Neurological Examination: Sensation grossly intact b/l with 10 gram monofilament. Vibratory sensation intact b/l. Pt has subjective symptoms of neuropathy.  Dermatological Examination: Pedal skin with normal turgor, texture and tone b/l.  No open wounds. No  interdigital macerations.   Toenails 1-5 b/l thick, discolored, elongated with subungual debris and pain on dorsal palpation.   Porokeratotic lesion(s) submet head 5 b/l. No erythema, no edema, no drainage, no fluctuance. Diffuse scaling noted peripherally and plantarly b/l feet.  No interdigital macerations.  No blisters, no weeping. No signs of secondary bacterial infection noted.  Musculoskeletal Examination: Muscle strength 5/5 to all lower extremity muscle groups bilaterally. No pain, crepitus or joint limitation noted with ROM bilateral LE. Plantarflexed metatarsal(s) 5th metatarsal head b/l lower extremities. HAV with bunion deformity noted b/l LE. Utilizes walker for ambulation assistance.  Radiographs: None  ADA Risk Categorization: High Risk  Patient has one or more of the following: Loss of protective sensation Absent pedal pulses Severe Foot deformity History of ulcer LLE  Assessment: 1. Pain due to onychomycosis of toenail   2. Porokeratosis   3. Tinea pedis of both feet   4. Type II diabetes mellitus with neurological manifestations (HCC)   5. Encounter for diabetic foot exam (HCC)     Plan: Meds ordered this encounter  Medications   ketoconazole (NIZORAL) 2 % cream    Sig: Apply 1 Application topically  daily.    Dispense:  60 g    Refill:  2   -Patient was evaluated and treated. All patient's and/or POA's questions/concerns answered on today's visit. -Diabetic foot examination performed today. -Continue diabetic foot care principles: inspect feet daily, monitor glucose as recommended by PCP and/or Endocrinologist, and follow prescribed diet per PCP, Endocrinologist and/or dietician. -Patient to continue soft, supportive shoe gear daily. -Toenails 1-5 b/l were debrided in length and girth with sterile nail nippers and dremel without iatrogenic bleeding.  -Porokeratotic lesion(s) submet head 5 b/l pared and enucleated with sterile currette without incident. Total number of lesions debrided=2. -Discussed tinea pedis infection. To prevent re-infection of tinea pedis, patient/POA/caregiver instructed to spray shoes with Lysol every evening and clean tub/shower with bleach based cleanser. -For tinea pedis, Rx sent to pharmacy for Ketoconazole Cream 2% to be applied once daily for six weeks. -Patient/POA to call should there be question/concern in the interim. Return in about 3 months (around 03/07/2023).  Freddie Breech, DPM

## 2023-01-03 ENCOUNTER — Other Ambulatory Visit: Payer: Self-pay | Admitting: Nephrology

## 2023-01-03 DIAGNOSIS — M1000 Idiopathic gout, unspecified site: Secondary | ICD-10-CM

## 2023-01-03 DIAGNOSIS — I10 Essential (primary) hypertension: Secondary | ICD-10-CM

## 2023-01-03 DIAGNOSIS — N189 Chronic kidney disease, unspecified: Secondary | ICD-10-CM | POA: Insufficient documentation

## 2023-01-03 DIAGNOSIS — E1122 Type 2 diabetes mellitus with diabetic chronic kidney disease: Secondary | ICD-10-CM

## 2023-01-03 DIAGNOSIS — E785 Hyperlipidemia, unspecified: Secondary | ICD-10-CM

## 2023-01-10 ENCOUNTER — Ambulatory Visit
Admission: RE | Admit: 2023-01-10 | Discharge: 2023-01-10 | Disposition: A | Discharge status: Home / Self Care | Payer: MEDICARE | Source: Ambulatory Visit | Attending: Nephrology | Admitting: Nephrology

## 2023-01-10 DIAGNOSIS — M1000 Idiopathic gout, unspecified site: Secondary | ICD-10-CM | POA: Diagnosis present

## 2023-01-10 DIAGNOSIS — E785 Hyperlipidemia, unspecified: Secondary | ICD-10-CM | POA: Insufficient documentation

## 2023-01-10 DIAGNOSIS — E1122 Type 2 diabetes mellitus with diabetic chronic kidney disease: Secondary | ICD-10-CM | POA: Insufficient documentation

## 2023-01-10 DIAGNOSIS — N181 Chronic kidney disease, stage 1: Secondary | ICD-10-CM | POA: Diagnosis present

## 2023-01-10 DIAGNOSIS — I10 Essential (primary) hypertension: Secondary | ICD-10-CM | POA: Diagnosis present

## 2023-01-30 ENCOUNTER — Ambulatory Visit (INDEPENDENT_AMBULATORY_CARE_PROVIDER_SITE_OTHER): Payer: MEDICARE | Admitting: Internal Medicine

## 2023-01-30 VITALS — BP 139/77 | HR 84 | Resp 16 | Ht 61.0 in | Wt 180.0 lb

## 2023-01-30 DIAGNOSIS — Z7189 Other specified counseling: Secondary | ICD-10-CM

## 2023-01-30 DIAGNOSIS — G4733 Obstructive sleep apnea (adult) (pediatric): Secondary | ICD-10-CM

## 2023-01-30 DIAGNOSIS — I10 Essential (primary) hypertension: Secondary | ICD-10-CM

## 2023-01-30 DIAGNOSIS — E669 Obesity, unspecified: Secondary | ICD-10-CM

## 2023-01-30 NOTE — Patient Instructions (Signed)

## 2023-01-30 NOTE — Progress Notes (Unsigned)
[facility] [address] [place], [place] [address]  Pulmonary Sleep Medicine   Office Visit Note  Patient Name: Cheryl Rios DOB: 18-Sep-1925 MRN [id number]    Chief Complaint: Obstructive Sleep Apnea visit  Brief History:  Cheryl Rios is seen today for an annual follow up on CPAP at 8 cmh20.  The patient has a 14 year history of sleep apnea. Patient is using PAP nightly.  The patient feels rested after sleeping with PAP.  The patient reports benefiting from PAP use. Reported sleepiness is  improved and the Epworth Sleepiness Score is 5 out of 24. The patient occasionally takes naps. The patient complains of the following: No complaints.  The compliance download shows 98% compliance with an average use time of 6:56 hours. The AHI is 0.4.  The patient does not complain of limb movements disrupting sleep.  ROS  General: (-) fever, (-) chills, (-) night sweat Nose and Sinuses: (-) nasal stuffiness or itchiness, (-) postnasal drip, (-) nosebleeds, (-) sinus trouble. Mouth and Throat: (-) sore throat, (-) hoarseness. Neck: (-) swollen glands, (-) enlarged thyroid, (-) neck pain. Respiratory: - cough, - shortness of breath, - wheezing. Neurologic: - numbness, - tingling. Psychiatric: - anxiety, - depression   Current Medication: Outpatient Encounter Medications as of 01/30/2023  Medication Sig   DULoxetine (CYMBALTA) 20 MG capsule PLEASE SEE ATTACHED FOR DETAILED DIRECTIONS   folic acid (FOLVITE) 1 MG tablet Take 1 mg by mouth daily.   methotrexate (RHEUMATREX) 2.5 MG tablet Take by mouth.   ACCU-CHEK SOFTCLIX LANCETS lancets USE 1 EACH ONCE DAILY. USE AS INSTRUCTED.   allopurinol (ZYLOPRIM) 100 MG tablet Take 1.5 tablets by mouth daily.   ammonium lactate (AMLACTIN) 12 % cream Apply 1 Application topically as needed for dry skin.   Ascorbic Acid (VITAMIN C) 100 MG tablet Take 500 mg by mouth daily.    augmented betamethasone dipropionate (DIPROLENE-AF) 0.05 % cream APPLY  TO AFFECTED AREA ON ARMS TWICE A DAY UNTIL CLEAR   B Complex Vitamins (VITAMIN B-COMPLEX) TABS Take by mouth.   Cholecalciferol (VITAMIN D3) 1000 units CAPS Take by mouth.   Cinnamon 500 MG capsule Take 1,000 mg by mouth 2 (two) times daily.    Cobalamin Combinations (VITAMIN B12-FOLIC ACID) 500-400 MCG TABS    cyanocobalamin (,VITAMIN B-12,) 1000 MCG/ML injection Inject into the muscle.   fish oil-omega-3 fatty acids 1000 MG capsule Take 1,200 mg by mouth daily.   fluticasone (FLONASE) 50 MCG/ACT nasal spray Place into the nose.   furosemide (LASIX) 40 MG tablet Take 40 mg by mouth daily. In am.   gabapentin (NEURONTIN) 100 MG capsule PLEASE SEE ATTACHED FOR DETAILED DIRECTIONS   glucose blood test strip TEST BLOOD SUGAR ONCE DAILY   hydrALAZINE (APRESOLINE) 50 MG tablet TAKE 1 TABLET (50 MG TOTAL) BY MOUTH ONCE DAILY.   hydroxychloroquine (PLAQUENIL) 200 MG tablet Take 200 mg by mouth daily.   ketoconazole (NIZORAL) 2 % cream Apply 1 Application topically daily.   levothyroxine (SYNTHROID, LEVOTHROID) 75 MCG tablet TAKE 1 TABLET BY MOUTH ONCE DAILY ON EMPTY STOMACH WITH WATER 30-60 MIN BEFORE BREAKFAST   lisinopril (PRINIVIL,ZESTRIL) 20 MG tablet TAKE 1 TABLET (20 MG TOTAL) BY MOUTH ONCE DAILY.   meloxicam (MOBIC) 7.5 MG tablet    metoprolol succinate (TOPROL-XL) 25 MG 24 hr tablet TAKE 1 TABLET BY MOUTH EVERY DAY   Multiple Vitamins-Minerals (WOMENS 50+ MULTI VITAMIN/MIN PO) Take 1 tablet by mouth daily.    mupirocin ointment (BACTROBAN) 2 %  APPLY TO AFFECTED AREA ONCE DAILY FOR 7 DAYS   nystatin cream (MYCOSTATIN) Apply topically.   potassium chloride (KLOR-CON) 10 MEQ tablet TAKE 1 TABLET BY MOUTH ONCE A DAY   triamcinolone cream (KENALOG) 0.5 % APPLY TO AFFECTED AREA TWICE A DAY   vitamin C (ASCORBIC ACID) 500 MG tablet Take by mouth.   vitamin E 400 UNIT capsule Take by mouth.   [DISCONTINUED] allopurinol (ZYLOPRIM) 100 MG tablet Take 100 mg by mouth daily.   [DISCONTINUED] FOLIC  ACID PO Take by mouth.   [DISCONTINUED] gabapentin (NEURONTIN) 600 MG tablet Take 1 tablet (600 mg total) by mouth daily after supper.   No facility-administered encounter medications on file as of 01/30/2023.    Surgical History: Past Surgical History:  Procedure Laterality Date   ABDOMINAL HYSTERECTOMY     CATARACT EXTRACTION W/PHACO Right 07/19/2016   Procedure: CATARACT EXTRACTION PHACO AND INTRAOCULAR LENS PLACEMENT (IOC);  Surgeon: Lockie Mola, MD;  Location: [facility] ORS;  Service: Ophthalmology;  Laterality: Right;  [place] 0:55.8 AP% 14.3 CDE 9.52 Fluid pack lot # [id number]   CATARACT EXTRACTION W/PHACO Left 08/18/2016   Procedure: CATARACT EXTRACTION PHACO AND INTRAOCULAR LENS PLACEMENT (IOC);  Surgeon: Lockie Mola, MD;  Location: [facility] ORS;  Service: Ophthalmology;  Laterality: Left;  US1:03.1 AP%20.6 CDE13.2 Fluid pack lot # [id number]   CHOLECYSTECTOMY     ERCP N/A 02/07/2017   Procedure: ENDOSCOPIC RETROGRADE CHOLANGIOPANCREATOGRAPHY (ERCP);  Surgeon: Midge Minium, MD;  Location: [facility] ENDOSCOPY;  Service: Endoscopy;  Laterality: N/A;   GALLBLADDER SURGERY     HEMORRHOID SURGERY     JOINT REPLACEMENT Bilateral    KNEE REPLACEMENT   KNEE SURGERY Right     Medical History: Past Medical History:  Diagnosis Date   Arrhythmia    Arthritis    Diabetes mellitus without complication (HCC)    Diverticulosis    Dysrhythmia    Edema of both feet    HOH (hard of hearing)    Hypertension    Hypothyroidism    Neuropathy    Osteoporosis    Sleep apnea    OSA--Use C-PAP   Thyroid disease     Family History: Non contributory to the present illness  Social History: Social History   Socioeconomic History   Marital status: Widowed    Spouse name: Not on file   Number of children: Not on file   Years of education: Not on file   Highest education level: Not on file  Occupational History   Not on file  Tobacco Use   Smoking status: Never   Smokeless tobacco: Never   Vaping Use   Vaping status: Never Used  Substance and Sexual Activity   Alcohol use: No   Drug use: No   Sexual activity: Never  Other Topics Concern   Not on file  Social History Narrative   Not on file   Social Determinants of Health   Financial Resource Strain: Low Risk  (01/19/2023)   Received from [facility]   Overall Financial Resource Strain (CARDIA)    Difficulty of Paying Living Expenses: Not hard at all  Food Insecurity: No Food Insecurity (01/19/2023)   Received from [facility]   Hunger Vital Sign    Worried About Running Out of Food in the Last Year: Never true    Ran Out of Food in the Last Year: Never true  Transportation Needs: No Transportation Needs (01/19/2023)   Received from [facility]   PRAPARE -  Transportation    In the past 12 months, has lack of transportation kept you from medical appointments or from getting medications?: No    Lack of Transportation (Non-Medical): No  Physical Activity: Not on file  Stress: Not on file  Social Connections: Not on file  Intimate Partner Violence: Not on file    Vital Signs: Blood pressure 139/77, pulse 84, resp. rate 16, height 5\' 1"  (1.549 m), weight 180 lb (81.6 kg), SpO2 95%. Body mass index is 34.01 kg/m.    Examination: General Appearance: The patient is well-developed, well-nourished, and in no distress. Neck Circumference: 46 cm Skin: Gross inspection of skin unremarkable. Head: normocephalic, no gross deformities. Eyes: no gross deformities noted. ENT: ears appear grossly normal Neurologic: Alert and oriented. No involuntary movements.  STOP BANG RISK ASSESSMENT S (snore) Have you been told that you snore?     NO   T (tired) Are you often tired, fatigued, or sleepy during the day?   NO  O (obstruction) Do you stop breathing, choke, or gasp during sleep? NO   P (pressure) Do you have or are you being treated for high blood pressure? YES    B (BMI) Is your body index greater than 35 kg/m? YES   A (age) Are you 34 years old or older? YES   N (neck) Do you have a neck circumference greater than 16 inches?   YES   G (gender) Are you a female? NO   TOTAL STOP/BANG "YES" ANSWERS 4       A STOP-Bang score of 2 or less is considered low risk, and a score of 5 or more is high risk for having either moderate or severe OSA. For people who score 3 or 4, doctors may need to perform further assessment to determine how likely they are to have OSA.         EPWORTH SLEEPINESS SCALE:  Scale:  (0)= no chance of dozing; (1)= slight chance of dozing; (2)= moderate chance of dozing; (3)= high chance of dozing  Chance  Situtation    Sitting and reading: 1    Watching TV: 1    Sitting Inactive in public: 2    As a passenger in car: 0      Lying down to rest: 0    Sitting and talking: 0    Sitting quielty after lunch: 1    In a car, stopped in traffic: 0   TOTAL SCORE:   5 out of 24    SLEEP STUDIES:  SPLIT (09/14/19) AHI 81.9, min SPO2 66%, CPAP at 8 cmh20   CPAP COMPLIANCE DATA:  Date Range: 01/26/22 - 01/25/23  Average Daily Use: 6:56 hours  Median Use: 7:06 hours  Compliance for > 4 Hours: 356 days  AHI: 0.4 respiratory events per hour  Days Used: 364/365  Mask Leak: 28.5  95th Percentile Pressure: 8 cmh20         LABS: No results found for this or any previous visit (from the past 2160 hour(s)).  Radiology: US RENAL  Result Date: 01/17/2023 CLINICAL DATA:  Gout, hypertension, hyperlipidemia, chronic renal disease. EXAM: RENAL / URINARY TRACT ULTRASOUND COMPLETE COMPARISON:  02/05/2017. FINDINGS: Right Kidney: Length: 9.1 cm. Echogenicity within normal limits. Right kidney midpole cyst measures 1.7 cm lower pole lesion measures 2.1 cm. This does not appear to be a simple cyst and further evaluation with renal protocol CT recommended. No shadowing stones or hydronephrosis. Left Kidney: Length:  8.8 cm. Echogenicity within normal  limits. No mass or hydronephrosis visualized. Bladder: Appears normal for degree of bladder distention. Bilateral ureteral jets. IMPRESSION: Right kidney simple cyst as well as a complex mass which could be evaluated further with renal protocol CT. Electronically Signed   By: Layla Maw M.D.   On: 01/17/2023 11:20    No results found.  US RENAL  Result Date: 01/17/2023 CLINICAL DATA:  Gout, hypertension, hyperlipidemia, chronic renal disease. EXAM: RENAL / URINARY TRACT ULTRASOUND COMPLETE COMPARISON:  02/05/2017. FINDINGS: Right Kidney: Length: 9.1 cm. Echogenicity within normal limits. Right kidney midpole cyst measures 1.7 cm lower pole lesion measures 2.1 cm. This does not appear to be a simple cyst and further evaluation with renal protocol CT recommended. No shadowing stones or hydronephrosis. Left Kidney: Length: 8.8 cm. Echogenicity within normal limits. No mass or hydronephrosis visualized. Bladder: Appears normal for degree of bladder distention. Bilateral ureteral jets. IMPRESSION: Right kidney simple cyst as well as a complex mass which could be evaluated further with renal protocol CT. Electronically Signed   By: Layla Maw M.D.   On: 01/17/2023 11:20      Assessment and Plan: Patient Active Problem List   Diagnosis Date Noted   Chronic kidney disease, unspecified 01/03/2023   Non-pressure chronic ulcer of unspecified part of left lower leg limited to breakdown of skin (HCC) 09/27/2022   Chronic gouty arthritis 10/28/2021   OSA on CPAP 02/01/2021   CPAP use counseling 02/01/2021   Hypertension 02/01/2021   Obesity (BMI 30-39.9) 02/01/2021   Callus 07/13/2020   Pedal edema 11/08/2017   Choledocholithiasis    Sepsis (HCC) 02/05/2017   Fever 02/04/2017   Weakness 02/04/2017   Failure to thrive in adult 02/04/2017   OA (osteoarthritis) 02/04/2017   Status post total left knee replacement 09/19/2016   Hyperlipidemia, unspecified  08/09/2014   Type 2 diabetes mellitus without complications (HCC) 08/09/2014   Osteoporosis 10/04/2013   Elevated LFTs 01/23/2011    1. OSA on CPAP The patient does tolerate PAP and reports  benefit from PAP use. The patient was reminded how to clean equipment and advised to replace supplies routinely. The patient was also counselled on weight loss. The compliance is excellent. The AHI is 0.4.   OSA on cpap- controlled. Continue with excellent compliance with pap. CPAP continues to be medically necessary to treat this patient's OSA. F/u one year.    2. CPAP use counseling CPAP Counseling: had a lengthy discussion with the patient regarding the importance of PAP therapy in management of the sleep apnea. Patient appears to understand the risk factor reduction and also understands the risks associated with untreated sleep apnea. Patient will try to make a good faith effort to remain compliant with therapy. Also instructed the patient on proper cleaning of the device including the water must be changed daily if possible and use of distilled water is preferred. Patient understands that the machine should be regularly cleaned with appropriate recommended cleaning solutions that do not damage the PAP machine for example given white vinegar and water rinses. Other methods such as ozone treatment may not be as good as these simple methods to achieve cleaning.   3. Hypertension, unspecified type Hypertension Counseling:   The following hypertensive lifestyle modification were recommended and discussed:  1. Limiting alcohol intake to less than 1 oz/day of ethanol:(24 oz of beer or 8 oz of wine or 2 oz of 100-proof whiskey). 2. Take baby ASA 81 mg daily. 3. Importance of regular aerobic exercise and losing weight.  4. Reduce dietary saturated fat and cholesterol intake for overall cardiovascular health. 5. Maintaining adequate dietary potassium, calcium, and magnesium intake. 6. Regular monitoring of the  blood pressure. 7. Reduce sodium intake to less than 100 mmol/day (less than 2.3 gm of sodium or less than 6 gm of sodium choride)    4. Obesity (BMI 30-39.9) Obesity Counseling: Had a lengthy discussion regarding patients BMI and weight issues. Patient was instructed on portion control as well as increased activity. Also discussed caloric restrictions with trying to maintain intake less than 2000 Kcal. Discussions were made in accordance with the 5As of weight management. Simple actions such as not eating late and if able to, taking a walk is suggested.     General Counseling: I have discussed the findings of the evaluation and examination with Tony.  I have also discussed any further diagnostic evaluation thatmay be needed or ordered today. Genavieve verbalizes understanding of the findings of todays visit. We also reviewed her medications today and discussed drug interactions and side effects including but not limited excessive drowsiness and altered mental states. We also discussed that there is always a risk not just to her but also people around her. she has been encouraged to call the office with any questions or concerns that should arise related to todays visit.  No orders of the defined types were placed in this encounter.       I have personally obtained a history, examined the patient, evaluated laboratory and imaging results, formulated the assessment and plan and placed orders. This patient was seen today by Emmaline Kluver, PA-C in collaboration with Dr. Freda Munro.   Yevonne Pax, MD [facility] Diplomate ABMS Pulmonary Critical Care Medicine and Sleep Medicine

## 2023-02-10 ENCOUNTER — Emergency Department: Payer: MEDICARE

## 2023-02-10 ENCOUNTER — Inpatient Hospital Stay
Admission: EM | Admit: 2023-02-10 | Discharge: 2023-02-15 | DRG: 481 | Disposition: A | Discharge status: Skilled Nursing Facility | Payer: MEDICARE | Attending: Hospitalist | Admitting: Hospitalist

## 2023-02-10 ENCOUNTER — Other Ambulatory Visit: Payer: Self-pay

## 2023-02-10 DIAGNOSIS — R609 Edema, unspecified: Secondary | ICD-10-CM | POA: Diagnosis not present

## 2023-02-10 DIAGNOSIS — I959 Hypotension, unspecified: Secondary | ICD-10-CM | POA: Diagnosis not present

## 2023-02-10 DIAGNOSIS — E785 Hyperlipidemia, unspecified: Secondary | ICD-10-CM | POA: Diagnosis present

## 2023-02-10 DIAGNOSIS — Z9842 Cataract extraction status, left eye: Secondary | ICD-10-CM

## 2023-02-10 DIAGNOSIS — J449 Chronic obstructive pulmonary disease, unspecified: Secondary | ICD-10-CM | POA: Diagnosis present

## 2023-02-10 DIAGNOSIS — Z79899 Other long term (current) drug therapy: Secondary | ICD-10-CM

## 2023-02-10 DIAGNOSIS — E669 Obesity, unspecified: Secondary | ICD-10-CM | POA: Diagnosis present

## 2023-02-10 DIAGNOSIS — Z961 Presence of intraocular lens: Secondary | ICD-10-CM | POA: Diagnosis present

## 2023-02-10 DIAGNOSIS — E1142 Type 2 diabetes mellitus with diabetic polyneuropathy: Secondary | ICD-10-CM | POA: Diagnosis present

## 2023-02-10 DIAGNOSIS — W010XXA Fall on same level from slipping, tripping and stumbling without subsequent striking against object, initial encounter: Secondary | ICD-10-CM | POA: Diagnosis present

## 2023-02-10 DIAGNOSIS — E039 Hypothyroidism, unspecified: Secondary | ICD-10-CM | POA: Diagnosis present

## 2023-02-10 DIAGNOSIS — Z79890 Hormone replacement therapy (postmenopausal): Secondary | ICD-10-CM

## 2023-02-10 DIAGNOSIS — W1830XA Fall on same level, unspecified, initial encounter: Secondary | ICD-10-CM | POA: Diagnosis not present

## 2023-02-10 DIAGNOSIS — M069 Rheumatoid arthritis, unspecified: Secondary | ICD-10-CM | POA: Diagnosis present

## 2023-02-10 DIAGNOSIS — D62 Acute posthemorrhagic anemia: Secondary | ICD-10-CM | POA: Diagnosis present

## 2023-02-10 DIAGNOSIS — I2510 Atherosclerotic heart disease of native coronary artery without angina pectoris: Secondary | ICD-10-CM | POA: Diagnosis present

## 2023-02-10 DIAGNOSIS — S72002A Fracture of unspecified part of neck of left femur, initial encounter for closed fracture: Secondary | ICD-10-CM | POA: Diagnosis not present

## 2023-02-10 DIAGNOSIS — S72142A Displaced intertrochanteric fracture of left femur, initial encounter for closed fracture: Principal | ICD-10-CM | POA: Diagnosis present

## 2023-02-10 DIAGNOSIS — G4733 Obstructive sleep apnea (adult) (pediatric): Secondary | ICD-10-CM | POA: Diagnosis present

## 2023-02-10 DIAGNOSIS — Z96653 Presence of artificial knee joint, bilateral: Secondary | ICD-10-CM | POA: Diagnosis present

## 2023-02-10 DIAGNOSIS — M810 Age-related osteoporosis without current pathological fracture: Secondary | ICD-10-CM | POA: Diagnosis present

## 2023-02-10 DIAGNOSIS — S0990XA Unspecified injury of head, initial encounter: Secondary | ICD-10-CM | POA: Diagnosis present

## 2023-02-10 DIAGNOSIS — I10 Essential (primary) hypertension: Secondary | ICD-10-CM | POA: Diagnosis present

## 2023-02-10 DIAGNOSIS — E860 Dehydration: Secondary | ICD-10-CM | POA: Diagnosis present

## 2023-02-10 DIAGNOSIS — Z90710 Acquired absence of both cervix and uterus: Secondary | ICD-10-CM | POA: Diagnosis not present

## 2023-02-10 DIAGNOSIS — Z01810 Encounter for preprocedural cardiovascular examination: Secondary | ICD-10-CM | POA: Diagnosis not present

## 2023-02-10 DIAGNOSIS — Z9841 Cataract extraction status, right eye: Secondary | ICD-10-CM | POA: Diagnosis not present

## 2023-02-10 DIAGNOSIS — Z6834 Body mass index (BMI) 34.0-34.9, adult: Secondary | ICD-10-CM

## 2023-02-10 DIAGNOSIS — S72009A Fracture of unspecified part of neck of unspecified femur, initial encounter for closed fracture: Secondary | ICD-10-CM | POA: Diagnosis present

## 2023-02-10 DIAGNOSIS — Z66 Do not resuscitate: Secondary | ICD-10-CM | POA: Diagnosis present

## 2023-02-10 DIAGNOSIS — Z9049 Acquired absence of other specified parts of digestive tract: Secondary | ICD-10-CM

## 2023-02-10 LAB — BASIC METABOLIC PANEL
Anion gap: 11 (ref 5–15)
BUN: 34 mg/dL — ABNORMAL HIGH (ref 8–23)
CO2: 25 mmol/L (ref 22–32)
Calcium: 9.2 mg/dL (ref 8.9–10.3)
Chloride: 101 mmol/L (ref 98–111)
Creatinine, Ser: 0.98 mg/dL (ref 0.44–1.00)
GFR, Estimated: 53 mL/min — ABNORMAL LOW (ref >60)
Glucose, Bld: 184 mg/dL — ABNORMAL HIGH (ref 70–99)
Potassium: 4.3 mmol/L (ref 3.5–5.1)
Sodium: 137 mmol/L (ref 135–145)

## 2023-02-10 LAB — CBC WITH DIFFERENTIAL/PLATELET
Abs Immature Granulocytes: 0.03 10*3/uL (ref 0.00–0.07)
Basophils Absolute: 0 10*3/uL (ref 0.0–0.1)
Basophils Relative: 0 %
Eosinophils Absolute: 0.1 10*3/uL (ref 0.0–0.5)
Eosinophils Relative: 2 %
HCT: 36.7 % (ref 36.0–46.0)
Hemoglobin: 12.3 g/dL (ref 12.0–15.0)
Immature Granulocytes: 1 %
Lymphocytes Relative: 21 %
Lymphs Abs: 1.2 10*3/uL (ref 0.7–4.0)
MCH: 32.2 pg (ref 26.0–34.0)
MCHC: 33.5 g/dL (ref 30.0–36.0)
MCV: 96.1 fL (ref 80.0–100.0)
Monocytes Absolute: 0.3 10*3/uL (ref 0.1–1.0)
Monocytes Relative: 5 %
Neutro Abs: 4.1 10*3/uL (ref 1.7–7.7)
Neutrophils Relative %: 71 %
Platelets: 190 10*3/uL (ref 150–400)
RBC: 3.82 MIL/uL — ABNORMAL LOW (ref 3.87–5.11)
RDW: 15.8 % — ABNORMAL HIGH (ref 11.5–15.5)
WBC: 5.7 10*3/uL (ref 4.0–10.5)
nRBC: 0 % (ref 0.0–0.2)

## 2023-02-10 LAB — HEMOGLOBIN A1C
Hgb A1c MFr Bld: 6.2 % — ABNORMAL HIGH (ref 4.8–5.6)
Mean Plasma Glucose: 131.24 mg/dL

## 2023-02-10 LAB — GLUCOSE, CAPILLARY
Glucose-Capillary: 206 mg/dL — ABNORMAL HIGH (ref 70–99)
Glucose-Capillary: 208 mg/dL — ABNORMAL HIGH (ref 70–99)

## 2023-02-10 MED ORDER — ENOXAPARIN SODIUM 40 MG/0.4ML IJ SOSY: 40.0000 mg | PREFILLED_SYRINGE | INTRAMUSCULAR | Status: DC

## 2023-02-10 MED ORDER — METOPROLOL SUCCINATE ER 25 MG PO TB24
25.0000 mg | ORAL_TABLET | Freq: Every day | ORAL | Status: DC
  Administered 2023-02-11 – 2023-02-15 (×4): 25 mg via ORAL
  Filled 2023-02-10 (×4): qty 1

## 2023-02-10 MED ORDER — HYDROCODONE-ACETAMINOPHEN 5-325 MG PO TABS
1.0000 | ORAL_TABLET | Freq: Four times a day (QID) | ORAL | Status: DC | PRN
  Administered 2023-02-10: 1 via ORAL
  Administered 2023-02-10: 2 via ORAL
  Filled 2023-02-10: qty 1
  Filled 2023-02-10: qty 2

## 2023-02-10 MED ORDER — SENNOSIDES-DOCUSATE SODIUM 8.6-50 MG PO TABS
1.0000 | ORAL_TABLET | Freq: Every evening | ORAL | Status: DC | PRN
  Administered 2023-02-13 – 2023-02-14 (×2): 1 via ORAL
  Filled 2023-02-10 (×2): qty 1

## 2023-02-10 MED ORDER — HYDROMORPHONE HCL 1 MG/ML IJ SOLN
0.5000 mg | Freq: Once | INTRAMUSCULAR | Status: AC
  Administered 2023-02-10: 0.5 mg via INTRAVENOUS
  Filled 2023-02-10: qty 0.5

## 2023-02-10 MED ORDER — HYDROMORPHONE HCL 1 MG/ML IJ SOLN: 0.5000 mg | INTRAMUSCULAR | Status: DC | PRN

## 2023-02-10 MED ORDER — ONDANSETRON HCL 4 MG/2ML IJ SOLN: 4.0000 mg | Freq: Four times a day (QID) | INTRAMUSCULAR | Status: DC | PRN

## 2023-02-10 MED ORDER — ALLOPURINOL 300 MG PO TABS
150.0000 mg | ORAL_TABLET | Freq: Every day | ORAL | Status: DC
  Administered 2023-02-11 – 2023-02-15 (×5): 150 mg via ORAL
  Filled 2023-02-10 (×5): qty 1

## 2023-02-10 MED ORDER — GABAPENTIN 100 MG PO CAPS
200.0000 mg | ORAL_CAPSULE | Freq: Three times a day (TID) | ORAL | Status: DC
  Administered 2023-02-10 – 2023-02-15 (×13): 200 mg via ORAL
  Filled 2023-02-10 (×13): qty 2

## 2023-02-10 MED ORDER — KETOCONAZOLE 2 % EX CREA
1.0000 | TOPICAL_CREAM | Freq: Every day | CUTANEOUS | Status: DC
  Administered 2023-02-12 – 2023-02-15 (×4): 1 via TOPICAL
  Filled 2023-02-10: qty 15

## 2023-02-10 MED ORDER — INSULIN ASPART 100 UNIT/ML IJ SOLN
0.0000 [IU] | Freq: Three times a day (TID) | INTRAMUSCULAR | Status: DC
  Administered 2023-02-10 – 2023-02-11 (×2): 3 [IU] via SUBCUTANEOUS
  Administered 2023-02-12 – 2023-02-14 (×7): 2 [IU] via SUBCUTANEOUS
  Administered 2023-02-14 (×2): 1 [IU] via SUBCUTANEOUS
  Administered 2023-02-15: 2 [IU] via SUBCUTANEOUS
  Administered 2023-02-15: 1 [IU] via SUBCUTANEOUS
  Filled 2023-02-10 (×13): qty 1

## 2023-02-10 MED ORDER — HYDROXYCHLOROQUINE SULFATE 200 MG PO TABS: 200.0000 mg | ORAL_TABLET | Freq: Every day | ORAL | Status: DC

## 2023-02-10 MED ORDER — SODIUM CHLORIDE 0.9 % IV SOLN: INTRAVENOUS | Status: AC

## 2023-02-10 MED ORDER — LEVOTHYROXINE SODIUM 50 MCG PO TABS
75.0000 ug | ORAL_TABLET | Freq: Every day | ORAL | Status: DC
  Administered 2023-02-12 – 2023-02-15 (×4): 75 ug via ORAL
  Filled 2023-02-10 (×5): qty 1

## 2023-02-10 MED ORDER — ACETAMINOPHEN 325 MG PO TABS: 650.0000 mg | ORAL_TABLET | Freq: Four times a day (QID) | ORAL | Status: DC | PRN

## 2023-02-10 MED ORDER — FLUTICASONE PROPIONATE 50 MCG/ACT NA SUSP: 1.0000 | Freq: Every day | NASAL | Status: DC | PRN

## 2023-02-10 MED ORDER — DULOXETINE HCL 20 MG PO CPEP
40.0000 mg | ORAL_CAPSULE | Freq: Every day | ORAL | Status: DC
  Administered 2023-02-11 – 2023-02-15 (×5): 40 mg via ORAL
  Filled 2023-02-10 (×5): qty 2

## 2023-02-10 MED ORDER — HYDRALAZINE HCL 10 MG PO TABS: 10.0000 mg | ORAL_TABLET | Freq: Four times a day (QID) | ORAL | Status: DC | PRN

## 2023-02-10 NOTE — H&P (Signed)
History and Physical    Cheryl Rios VHQ:[id number] DOB: Mar 12, 1926 DOA: 02/10/2023  PCP: Jerl Mina, MD (Confirm with patient/family/NH records and if not entered, this has to be entered at [facility] point of entry) Patient coming from: Home  I have personally briefly reviewed patient's old medical records in [facility]  Chief Complaint: I fell and broke my hip  HPI: Cheryl Rios is a 89 y.o. female with medical history significant of HTN, HLD, rheumatoid arthritis, IIDM on diet control, peripheral neuropathy, OSA on CPAP at bedtime, hypothyroidism, osteoporosis, morbid obesity, brought in by family member for fall and left hip pain.  Patient fell out of her left hip when she was trying to enter a car door with the car door slammed her cane and she lost balance she fell on her left hip.  With excruciating pain she could not stand up on her own via.  She also reported when she fell she also hit her left forehead but denied any LOC.  She denies any chest pain shortness of breath lightheadedness before or after the fall.  She has been healthy for her age, uses a cane to ambulate and denies any shortness of breath chest pain associate with exertion.  No history of MI or stroke in the past.  ED Course: Afebrile, none tachycardia blood pressure 120/55 authorization 9 5% on room air.  Hip x-ray showed acute left intertrochanter comminuted fracture.  Blood work showed hemoglobin 12.3, WBC 5.7, glucose 184, creatinine 0.9.  Review of Systems: As per HPI otherwise 10 point review of systems negative.  Unacceptable ROS statements: "10 systems reviewed," "Extensive" (without elaboration).  Acceptable ROS statements: "All others negative," "All others reviewed and are negative," and "All others unremarkable," with at LEAST ONE ROS documented Can't double dip - if using for HPI can't use for ROS  Past Medical History:  Diagnosis Date   Arrhythmia    Arthritis    Diabetes mellitus without  complication (HCC)    Diverticulosis    Dysrhythmia    Edema of both feet    HOH (hard of hearing)    Hypertension    Hypothyroidism    Neuropathy    Osteoporosis    Sleep apnea    OSA--Use C-PAP   Thyroid disease     Past Surgical History:  Procedure Laterality Date   ABDOMINAL HYSTERECTOMY     CATARACT EXTRACTION W/PHACO Right 07/19/2016   Procedure: CATARACT EXTRACTION PHACO AND INTRAOCULAR LENS PLACEMENT (IOC);  Surgeon: Lockie Mola, MD;  Location: [facility] ORS;  Service: Ophthalmology;  Laterality: Right;  [place] 0:55.8 AP% 14.3 CDE 9.52 Fluid pack lot # [id number]   CATARACT EXTRACTION W/PHACO Left 08/18/2016   Procedure: CATARACT EXTRACTION PHACO AND INTRAOCULAR LENS PLACEMENT (IOC);  Surgeon: Lockie Mola, MD;  Location: [facility] ORS;  Service: Ophthalmology;  Laterality: Left;  US1:03.1 AP%20.6 CDE13.2 Fluid pack lot # [id number]   CHOLECYSTECTOMY     ERCP N/A 02/07/2017   Procedure: ENDOSCOPIC RETROGRADE CHOLANGIOPANCREATOGRAPHY (ERCP);  Surgeon: Midge Minium, MD;  Location: [facility] ENDOSCOPY;  Service: Endoscopy;  Laterality: N/A;   GALLBLADDER SURGERY     HEMORRHOID SURGERY     JOINT REPLACEMENT Bilateral    KNEE REPLACEMENT   KNEE SURGERY Right      reports that she has never smoked. She has never used smokeless tobacco. She reports that she does not drink alcohol and does not use drugs.  No Known Allergies  Family History  Problem Relation Age of Onset  Breast cancer Neg Hx      Prior to Admission medications   Medication Sig Start Date End Date Taking? Authorizing Provider  ACCU-CHEK SOFTCLIX LANCETS lancets USE 1 EACH ONCE DAILY. USE AS INSTRUCTED. 05/31/18   [provider]  allopurinol (ZYLOPRIM) 100 MG tablet Take 1.5 tablets by mouth daily. 12/14/20   [provider]  ammonium lactate (AMLACTIN) 12 % cream Apply 1 Application topically as needed for dry skin. 08/29/22   McDonald, Rachelle Hora, DPM  Ascorbic Acid (VITAMIN C) 100 MG tablet Take  500 mg by mouth daily.     [provider]  augmented betamethasone dipropionate (DIPROLENE-AF) 0.05 % cream APPLY TO AFFECTED AREA ON ARMS TWICE A DAY UNTIL CLEAR 11/19/18   [provider]  B Complex Vitamins (VITAMIN B-COMPLEX) TABS Take by mouth.    [provider]  Cholecalciferol (VITAMIN D3) 1000 units CAPS Take by mouth.    [provider]  Cinnamon 500 MG capsule Take 1,000 mg by mouth 2 (two) times daily.     [provider]  Cobalamin Combinations (VITAMIN B12-FOLIC ACID) 500-400 MCG TABS  07/02/19   [provider]  cyanocobalamin (,VITAMIN B-12,) 1000 MCG/ML injection Inject into the muscle. 12/15/17   [provider]  DULoxetine (CYMBALTA) 20 MG capsule PLEASE SEE ATTACHED FOR DETAILED DIRECTIONS 12/21/22   [provider]  fish oil-omega-3 fatty acids 1000 MG capsule Take 1,200 mg by mouth daily.    [provider]  fluticasone (FLONASE) 50 MCG/ACT nasal spray Place into the nose. 06/05/17 06/05/18  [provider]  folic acid (FOLVITE) 1 MG tablet Take 1 mg by mouth daily. 11/03/22   [provider]  furosemide (LASIX) 40 MG tablet Take 40 mg by mouth daily. In am.    [provider]  gabapentin (NEURONTIN) 100 MG capsule PLEASE SEE ATTACHED FOR DETAILED DIRECTIONS    [provider]  glucose blood test strip TEST BLOOD SUGAR ONCE DAILY 05/29/18   [provider]  hydrALAZINE (APRESOLINE) 50 MG tablet TAKE 1 TABLET (50 MG TOTAL) BY MOUTH ONCE DAILY. 05/18/18   [provider]  hydroxychloroquine (PLAQUENIL) 200 MG tablet Take 200 mg by mouth daily. 11/15/18   [provider]  ketoconazole (NIZORAL) 2 % cream Apply 1 Application topically daily. 12/05/22   Freddie Breech, DPM  levothyroxine (SYNTHROID, LEVOTHROID) 75 MCG tablet TAKE 1 TABLET BY MOUTH ONCE DAILY ON EMPTY STOMACH WITH WATER 30-60 MIN BEFORE BREAKFAST 05/18/18   [provider]   lisinopril (PRINIVIL,ZESTRIL) 20 MG tablet TAKE 1 TABLET (20 MG TOTAL) BY MOUTH ONCE DAILY. 05/18/18   [provider]  meloxicam (MOBIC) 7.5 MG tablet  07/23/14   [provider]  methotrexate (RHEUMATREX) 2.5 MG tablet Take by mouth. 12/21/22 03/21/23  [provider]  metoprolol succinate (TOPROL-XL) 25 MG 24 hr tablet TAKE 1 TABLET BY MOUTH EVERY DAY 10/18/16   [provider]  Multiple Vitamins-Minerals (WOMENS 50+ MULTI VITAMIN/MIN PO) Take 1 tablet by mouth daily.     [provider]  mupirocin ointment (BACTROBAN) 2 % APPLY TO AFFECTED AREA ONCE DAILY FOR 7 DAYS 12/04/16   [provider]  nystatin cream (MYCOSTATIN) Apply topically. 05/14/18   [provider]  potassium chloride (KLOR-CON) 10 MEQ tablet TAKE 1 TABLET BY MOUTH ONCE A DAY 10/13/16   [provider]  triamcinolone cream (KENALOG) 0.5 % APPLY TO AFFECTED AREA TWICE A DAY 11/18/18   [provider]  vitamin C (ASCORBIC ACID) 500 MG tablet Take by mouth.    [provider]  vitamin E 400 UNIT capsule Take by mouth.    [provider]    Physical Exam: Vitals:   02/10/23 1321 02/10/23 1324  BP: (!) 120/55   Pulse: 96   Resp: 18   Temp: (!) 97.4 F (36.3 C)   TempSrc: Oral   SpO2: 90%   Weight:  83.9 kg  Height:  5' 1.5" (1.562 m)    Constitutional: NAD, calm, comfortable Vitals:   02/10/23 1321 02/10/23 1324  BP: (!) 120/55   Pulse: 96   Resp: 18   Temp: (!) 97.4 F (36.3 C)   TempSrc: Oral   SpO2: 90%   Weight:  83.9 kg  Height:  5' 1.5" (1.562 m)   Eyes: PERRL, lids and conjunctivae normal ENMT: Mucous membranes are moist. Posterior pharynx clear of any exudate or lesions.Normal dentition.  Neck: normal, supple, no masses, no thyromegaly Respiratory: clear to auscultation bilaterally, no wheezing, no crackles. Normal respiratory effort. No accessory muscle use.  Cardiovascular: Regular rate and rhythm, soft  systolic murmur on heart base. trace extremity edema. 2+ pedal pulses. No carotid bruits.  Abdomen: no tenderness, no masses palpated. No hepatosplenomegaly. Bowel sounds positive.  Musculoskeletal: Left leg shortened and rotated Skin: no rashes, lesions, ulcers. No induration Neurologic: CN 2-12 grossly intact. Sensation intact, DTR normal. Strength 5/5 in all 4.  Psychiatric: Normal judgment and insight. Alert and oriented x 3. Normal mood.     Labs on Admission: I have personally reviewed following labs and imaging studies  CBC: Recent Labs  Lab 02/10/23 1333  WBC 5.7  NEUTROABS 4.1  HGB 12.3  HCT 36.7  MCV 96.1  PLT 190   Basic Metabolic Panel: Recent Labs  Lab 02/10/23 1333  NA 137  K 4.3  CL 101  CO2 25  GLUCOSE 184*  BUN 34*  CREATININE 0.98  CALCIUM 9.2   GFR: Estimated Creatinine Clearance: 34.2 mL/min (by C-G formula based on SCr of 0.98 mg/dL). Liver Function Tests: No results for input(s): "AST", "ALT", "ALKPHOS", "BILITOT", "PROT", "ALBUMIN" in the last 168 hours. No results for input(s): "LIPASE", "AMYLASE" in the last 168 hours. No results for input(s): "AMMONIA" in the last 168 hours. Coagulation Profile: No results for input(s): "INR", "PROTIME" in the last 168 hours. Cardiac Enzymes: No results for input(s): "CKTOTAL", "CKMB", "CKMBINDEX", "TROPONINI" in the last 168 hours. BNP (last 3 results) No results for input(s): "PROBNP" in the last 8760 hours. HbA1C: No results for input(s): "HGBA1C" in the last 72 hours. CBG: No results for input(s): "GLUCAP" in the last 168 hours. Lipid Profile: No results for input(s): "CHOL", "HDL", "LDLCALC", "TRIG", "CHOLHDL", "LDLDIRECT" in the last 72 hours. Thyroid Function Tests: No results for input(s): "TSH", "T4TOTAL", "FREET4", "T3FREE", "THYROIDAB" in the last 72 hours. Anemia Panel: No results for input(s): "VITAMINB12", "FOLATE", "FERRITIN", "TIBC", "IRON", "RETICCTPCT" in the last 72 hours. Urine  analysis:    Component Value Date/Time   COLORURINE AMBER (A) 02/04/2017 1145   APPEARANCEUR CLEAR (A) 02/04/2017 1145   APPEARANCEUR Turbid 06/18/2012 0900   LABSPEC 1.017 02/04/2017 1145   LABSPEC 1.020 06/18/2012 0900   PHURINE 5.0 02/04/2017 1145   GLUCOSEU NEGATIVE 02/04/2017 1145   GLUCOSEU Negative 06/18/2012 0900   HGBUR NEGATIVE 02/04/2017 1145   BILIRUBINUR NEGATIVE 02/04/2017 1145   BILIRUBINUR Negative 06/18/2012 0900   KETONESUR NEGATIVE 02/04/2017 1145   PROTEINUR NEGATIVE 02/04/2017 1145   NITRITE  NEGATIVE 02/04/2017 1145   LEUKOCYTESUR NEGATIVE 02/04/2017 1145   LEUKOCYTESUR Trace 06/18/2012 0900    Radiological Exams on Admission: DG Chest 1 View  Result Date: 02/10/2023 CLINICAL DATA:  Preop.  Hip fracture EXAM: CHEST  1 VIEW portable supine COMPARISON:  02/06/2017 FINDINGS: Film is rotated to the left. Calcified and tortuous aorta. Prominent calcifications along the tracheobronchial tree. Stable cardiopericardial silhouette. Interstitial lung changes. Apical pleural thickening. No consolidation, pneumothorax or effusion. Overlapping cardiac leads. Degenerative changes of the spine with osteopenia IMPRESSION: Rotated radiograph. Chronic changes. No acute cardiopulmonary disease Electronically Signed   By: Karen Kays M.D.   On: 02/10/2023 15:04   DG Hip Unilat With Pelvis 2-3 Views Left  Result Date: 02/10/2023 CLINICAL DATA:  Pain after fall EXAM: DG HIP (WITH OR WITHOUT PELVIS) 3V LEFT COMPARISON:  None Available. FINDINGS: Comminuted intertrochanteric hip fracture with foreshortening and displaced fragments. Severe osteopenia. Mild joint space loss of the hips and sacroiliac joints. Hyperostosis. Moderate degenerative changes of the spine at the edge of the imaging field. Diffuse colonic stool. IMPRESSION: Comminuted foreshortened, displaced and angulated intertrochanteric left hip fracture. Osteopenia with degenerative changes. Electronically Signed   By: Karen Kays M.D.   On: 02/10/2023 15:03    EKG: Sinus rhythm, no acute ST changes.  Assessment/Plan Principal Problem:   Hip fracture (HCC) Active Problems:   OSA on CPAP   Hypertension   Hyperlipidemia, unspecified   Closed left hip fracture, initial encounter (HCC)  (please populate well all problems here in Problem List. (For example, if patient is on BP meds at home and you resume or decide to hold them, it is a problem that needs to be her. Same for CAD, COPD, HLD and so on)  Acute comminuted displaced and angulated intertrochanteric hip fracture -Secondary to mechanical fall -ORIF tomorrowPRN Dilaudid for pain control -Calculated 30 days perioperation major cardiac risk 3.9%, medically cleared for tomorrow's ORIF with generalized anesthesia with acceptable risk. Will also order any pre-op Echo for help further stratify cardiac risk.  Fall -Appears to have no concussion -CT head and neck negative for intracranial bleeding fracture or dislocation  Osteopenia -Continue vitamin D supplement  HTN -Continue beta-blocker -Hold off ARB -Hold off Lasix -Change hydralazine to as needed  IIDM on diet control -SSI  Hypothyroidism -Continue Synthroid  RA -Continue hydroxychloroquine -Weekly methotrexate  OSA -CPAP HS  Obesity -BMI=34, calorie control recommended   DVT prophylaxis: Lovenox Code Status: DNR Family Communication: Son at bedside Disposition Plan: Patient sick with hip fracture requiring ORIF, expect more than 2 midnight hospital stay Consults called: Ortho Admission status: Medsurg    Emeline General MD [facility] Pager [phone]  02/10/2023, 3:35 PM

## 2023-02-10 NOTE — Consult Note (Signed)
Orthopedic Surgery Inpatient/ED Consultation Note  Reason for consult: Left hip fracture  Date of consult: February 10, 2023 Referring provider: ED  Chief Complaint: Chief Complaint  Patient presents with   Fall    HPI: Cheryl Rios IS A female who is seen today regarding a complaint of left hip pain.  She had a ground-level fall today when she was getting into the car after lunch.  She had hip pain she could not stand up and walk and she was taken to the ER.  She was found to have a left hip intertrochanteric fracture.  I was consulted as orthopedic surgeon on-call.  Nature: Acute injury Location: Left hip Intensity: Moderate to severe Character: Sharp aching pain Modifying Factors: Movement makes it worse Prior surgery: Prior knee arthroplasty, no prior hip surgery Was there trauma: Yes  Past Medical History: Reviewed. Past Medical History:  Diagnosis Date   Arrhythmia    Arthritis    Diabetes mellitus without complication (HCC)    Diverticulosis    Dysrhythmia    Edema of both feet    HOH (hard of hearing)    Hypertension    Hypothyroidism    Neuropathy    Osteoporosis    Sleep apnea    OSA--Use C-PAP   Thyroid disease     Past Surgical History: Reviewed. Past Surgical History:  Procedure Laterality Date   ABDOMINAL HYSTERECTOMY     CATARACT EXTRACTION W/PHACO Right 07/19/2016   Procedure: CATARACT EXTRACTION PHACO AND INTRAOCULAR LENS PLACEMENT (IOC);  Surgeon: Lockie Mola, MD;  Location: [facility] ORS;  Service: Ophthalmology;  Laterality: Right;  [place] 0:55.8 AP% 14.3 CDE 9.52 Fluid pack lot # [id number]   CATARACT EXTRACTION W/PHACO Left 08/18/2016   Procedure: CATARACT EXTRACTION PHACO AND INTRAOCULAR LENS PLACEMENT (IOC);  Surgeon: Lockie Mola, MD;  Location: [facility] ORS;  Service: Ophthalmology;  Laterality: Left;  US1:03.1 AP%20.6 CDE13.2 Fluid pack lot # [id number]   CHOLECYSTECTOMY     ERCP N/A 02/07/2017   Procedure: ENDOSCOPIC RETROGRADE  CHOLANGIOPANCREATOGRAPHY (ERCP);  Surgeon: Midge Minium, MD;  Location[facility] ENDOSCOPY;  Service: Endoscopy;  Laterality: N/A;   GALLBLADDER SURGERY     HEMORRHOID SURGERY     JOINT REPLACEMENT Bilateral    KNEE REPLACEMENT   KNEE SURGERY Right     Family History: Reviewed. The family history is not on file.  Social History: Reviewed. Social History   Socioeconomic History   Marital status: Widowed    Spouse name: Not on file   Number of children: Not on file   Years of education: Not on file   Highest education level: Not on file  Occupational History   Not on file  Tobacco Use   Smoking status: Never   Smokeless tobacco: Never  Vaping Use   Vaping status: Never Used  Substance and Sexual Activity   Alcohol use: No   Drug use: No   Sexual activity: Never  Other Topics Concern   Not on file  Social History Narrative   Not on file   Social Determinants of Health   Financial Resource Strain: Low Risk  (01/19/2023)   Received [facility]   Overall Financial Resource Strain (CARDIA)    Difficulty of Paying Living Expenses: Not hard at all  Food Insecurity: No Food Insecurity (01/19/2023)   Received from [facility]   Hunger Vital Sign    Worried About Running Out of Food in the Last Year: Never true    Ran Out  of Food in the Last Year: Never true  Transportation Needs: No Transportation Needs (01/19/2023)   Received from [facility] - Transportation    In the past 12 months, has lack of transportation kept you from medical appointments or from getting medications?: No    Lack of Transportation (Non-Medical): No  Physical Activity: Not on file  Stress: Not on file  Social Connections: Not on file    Home Meds: Reviewed. Prior to Admission medications   Medication Sig Start Date End Date Taking? Authorizing Provider  ACCU-CHEK SOFTCLIX LANCETS lancets USE 1 EACH ONCE DAILY. USE AS INSTRUCTED.  05/31/18   [provider]  allopurinol (ZYLOPRIM) 100 MG tablet Take 1.5 tablets by mouth daily. 12/14/20   [provider]  ammonium lactate (AMLACTIN) 12 % cream Apply 1 Application topically as needed for dry skin. 08/29/22   McDonald, Rachelle Hora, DPM  Ascorbic Acid (VITAMIN C) 100 MG tablet Take 500 mg by mouth daily.     [provider]  augmented betamethasone dipropionate (DIPROLENE-AF) 0.05 % cream APPLY TO AFFECTED AREA ON ARMS TWICE A DAY UNTIL CLEAR 11/19/18   [provider]  B Complex Vitamins (VITAMIN B-COMPLEX) TABS Take by mouth.    [provider]  Cholecalciferol (VITAMIN D3) 1000 units CAPS Take by mouth.    [provider]  Cinnamon 500 MG capsule Take 1,000 mg by mouth 2 (two) times daily.     [provider]  Cobalamin Combinations (VITAMIN B12-FOLIC ACID) 500-400 MCG TABS  07/02/19   [provider]  cyanocobalamin (,VITAMIN B-12,) 1000 MCG/ML injection Inject into the muscle. 12/15/17   [provider]  DULoxetine (CYMBALTA) 20 MG capsule PLEASE SEE ATTACHED FOR DETAILED DIRECTIONS 12/21/22   [provider]  fish oil-omega-3 fatty acids 1000 MG capsule Take 1,200 mg by mouth daily.    [provider]  fluticasone (FLONASE) 50 MCG/ACT nasal spray Place into the nose. 06/05/17 06/05/18  [provider]  folic acid (FOLVITE) 1 MG tablet Take 1 mg by mouth daily. 11/03/22   [provider]  furosemide (LASIX) 40 MG tablet Take 40 mg by mouth daily. In am.    [provider]  gabapentin (NEURONTIN) 100 MG capsule PLEASE SEE ATTACHED FOR DETAILED DIRECTIONS    [provider]  glucose blood test strip TEST BLOOD SUGAR ONCE DAILY 05/29/18   [provider]  hydrALAZINE (APRESOLINE) 50 MG tablet TAKE 1 TABLET (50 MG TOTAL) BY MOUTH ONCE DAILY. 05/18/18   [provider]  hydroxychloroquine (PLAQUENIL) 200 MG tablet Take 200 mg by mouth daily.  11/15/18   [provider]  ketoconazole (NIZORAL) 2 % cream Apply 1 Application topically daily. 12/05/22   Freddie Breech, DPM  levothyroxine (SYNTHROID, LEVOTHROID) 75 MCG tablet TAKE 1 TABLET BY MOUTH ONCE DAILY ON EMPTY STOMACH WITH WATER 30-60 MIN BEFORE BREAKFAST 05/18/18   [provider]  lisinopril (PRINIVIL,ZESTRIL) 20 MG tablet TAKE 1 TABLET (20 MG TOTAL) BY MOUTH ONCE DAILY. 05/18/18   [provider]  meloxicam (MOBIC) 7.5 MG tablet  07/23/14   [provider]  methotrexate (RHEUMATREX) 2.5 MG tablet Take by mouth. 12/21/22 03/21/23  [provider]  metoprolol succinate (TOPROL-XL) 25 MG 24 hr tablet TAKE 1 TABLET BY MOUTH EVERY DAY 10/18/16   [provider]  Multiple Vitamins-Minerals (WOMENS 50+ MULTI VITAMIN/MIN PO) Take 1 tablet by mouth daily.     [provider]  mupirocin  ointment (BACTROBAN) 2 % APPLY TO AFFECTED AREA ONCE DAILY FOR 7 DAYS 12/04/16   [provider]  nystatin cream (MYCOSTATIN) Apply topically. 05/14/18   [provider]  potassium chloride (KLOR-CON) 10 MEQ tablet TAKE 1 TABLET BY MOUTH ONCE A DAY 10/13/16   [provider]  triamcinolone cream (KENALOG) 0.5 % APPLY TO AFFECTED AREA TWICE A DAY 11/18/18   [provider]  vitamin C (ASCORBIC ACID) 500 MG tablet Take by mouth.    [provider]  vitamin E 400 UNIT capsule Take by mouth.    [provider]    Hospital Medications: Reviewed. Current Facility-Administered Medications  Medication Dose Route Frequency Provider Last Rate Last Admin   0.9 %  sodium chloride infusion   Intravenous Continuous Mikey College T, MD       acetaminophen (TYLENOL) tablet 650 mg  650 mg Oral Q6H PRN Emeline General, MD       allopurinol (ZYLOPRIM) tablet 150 mg  150 mg Oral Daily Mikey College T, MD       DULoxetine (CYMBALTA) DR capsule 20 mg  20 mg Oral Daily Mikey College T, MD       enoxaparin (LOVENOX)  injection 40 mg  40 mg Subcutaneous Q24H Mikey College T, MD       fluticasone (FLONASE) 50 MCG/ACT nasal spray 1 spray  1 spray Each Nare Daily Mikey College T, MD       gabapentin (NEURONTIN) capsule 100 mg  100 mg Oral QHS Mikey College T, MD       hydrALAZINE (APRESOLINE) tablet 10 mg  10 mg Oral Q6H PRN Emeline General, MD       HYDROcodone-acetaminophen (NORCO/VICODIN) 5-325 MG per tablet 1-2 tablet  1-2 tablet Oral Q6H PRN Mikey College T, MD       HYDROmorphone (DILAUDID) injection 0.5 mg  0.5 mg Intravenous Q2H PRN Mikey College T, MD       hydroxychloroquine (PLAQUENIL) tablet 200 mg  200 mg Oral Daily Zhang, Ping T, MD       insulin aspart (novoLOG) injection 0-9 Units  0-9 Units Subcutaneous TID WC Mikey College T, MD       ketoconazole (NIZORAL) 2 % cream 1 Application  1 Application Topical Daily Emeline General, MD       [START ON 02/11/2023] levothyroxine (SYNTHROID) tablet 75 mcg  75 mcg Oral Q0600 Mikey College T, MD       metoprolol succinate (TOPROL-XL) 24 hr tablet 25 mg  25 mg Oral Daily Mikey College T, MD       ondansetron [facility]) injection 4 mg  4 mg Intravenous Q6H PRN Emeline General, MD       senna-docusate (Senokot-S) tablet 1 tablet  1 tablet Oral QHS PRN Emeline General, MD       Current Outpatient Medications  Medication Sig Dispense Refill   ACCU-CHEK SOFTCLIX LANCETS lancets USE 1 EACH ONCE DAILY. USE AS INSTRUCTED.     allopurinol (ZYLOPRIM) 100 MG tablet Take 1.5 tablets by mouth daily.     ammonium lactate (AMLACTIN) 12 % cream Apply 1 Application topically as needed for dry skin. 385 g 2   Ascorbic Acid (VITAMIN C) 100 MG tablet Take 500 mg by mouth daily.      augmented betamethasone dipropionate (DIPROLENE-AF) 0.05 % cream APPLY TO AFFECTED AREA ON ARMS TWICE A DAY UNTIL CLEAR     B Complex Vitamins (VITAMIN B-COMPLEX) TABS Take by mouth.  Cholecalciferol (VITAMIN D3) 1000 units CAPS Take by mouth.     Cinnamon 500 MG capsule Take 1,000 mg by mouth 2 (two) times daily.       Cobalamin Combinations (VITAMIN B12-FOLIC ACID) 500-400 MCG TABS      cyanocobalamin (,VITAMIN B-12,) 1000 MCG/ML injection Inject into the muscle.     DULoxetine (CYMBALTA) 20 MG capsule PLEASE SEE ATTACHED FOR DETAILED DIRECTIONS     fish oil-omega-3 fatty acids 1000 MG capsule Take 1,200 mg by mouth daily.     fluticasone (FLONASE) 50 MCG/ACT nasal spray Place into the nose.     folic acid (FOLVITE) 1 MG tablet Take 1 mg by mouth daily.     furosemide (LASIX) 40 MG tablet Take 40 mg by mouth daily. In am.     gabapentin (NEURONTIN) 100 MG capsule PLEASE SEE ATTACHED FOR DETAILED DIRECTIONS     glucose blood test strip TEST BLOOD SUGAR ONCE DAILY     hydrALAZINE (APRESOLINE) 50 MG tablet TAKE 1 TABLET (50 MG TOTAL) BY MOUTH ONCE DAILY.     hydroxychloroquine (PLAQUENIL) 200 MG tablet Take 200 mg by mouth daily.     ketoconazole (NIZORAL) 2 % cream Apply 1 Application topically daily. 60 g 2   levothyroxine (SYNTHROID, LEVOTHROID) 75 MCG tablet TAKE 1 TABLET BY MOUTH ONCE DAILY ON EMPTY STOMACH WITH WATER 30-60 MIN BEFORE BREAKFAST     lisinopril (PRINIVIL,ZESTRIL) 20 MG tablet TAKE 1 TABLET (20 MG TOTAL) BY MOUTH ONCE DAILY.     meloxicam (MOBIC) 7.5 MG tablet      methotrexate (RHEUMATREX) 2.5 MG tablet Take by mouth.     metoprolol succinate (TOPROL-XL) 25 MG 24 hr tablet TAKE 1 TABLET BY MOUTH EVERY DAY     Multiple Vitamins-Minerals (WOMENS 50+ MULTI VITAMIN/MIN PO) Take 1 tablet by mouth daily.      mupirocin ointment (BACTROBAN) 2 % APPLY TO AFFECTED AREA ONCE DAILY FOR 7 DAYS  0   nystatin cream (MYCOSTATIN) Apply topically.     potassium chloride (KLOR-CON) 10 MEQ tablet TAKE 1 TABLET BY MOUTH ONCE A DAY     triamcinolone cream (KENALOG) 0.5 % APPLY TO AFFECTED AREA TWICE A DAY     vitamin C (ASCORBIC ACID) 500 MG tablet Take by mouth.     vitamin E 400 UNIT capsule Take by mouth.      Allergies: Reviewed. No Known Allergies  Review of Systems: General ROS: negative for -  fever Psychological ROS: negative Ophthalmic ROS: negative ENT ROS: negative Allergy and Immunology ROS: negative Hematological and Lymphatic ROS: negative Endocrine ROS: negative Cardiovascular ROS: negative Gastrointestinal ROS: negative Musculoskeletal ROS: negative except HPI Neurological ROS: negative Dermatological ROS: negative  Physical Exam:    02/10/2023    1:24 PM 02/10/2023    1:21 PM 01/30/2023    1:30 PM  Vitals with BMI  Height 5' 1.5"  5\' 1"   Weight 185 lbs  180 lbs  BMI 34.39  34.03  Systolic  120 139  Diastolic  55 77  Pulse  96 84    Body mass index is 34.39 kg/m.    @RRVITAL @   General Appearance: alert, appears stated age, cooperative, in no acute distress Head: Normocephalic, without obvious abnormality, atraumatic Eyes: No scleral injection or drainage Ears: Gross hearing intact Neck: No JVD, supple, symmetrical, trachea midline Lungs: Non-labored breathing Heart: Regular rate and rhythm Extremities: Left lower extremity is shortened and externally rotated.  She does not move it due to pain at  the hip.  She cannot do a straight leg raise.  There is pain with attempted passive motion.  She can wiggle the ankle foot and toes.  There is no bony tenderness in the foot at the ankle the leg or at the knee.  There is tenderness in the mid thigh area and up at the left hip. He moves the right lower extremity well.  She moves the bilateral upper extremities well.  Superficial abrasion on the left elbow noted. Pulses: 2+ and symmetric Skin: Normal except as noted in examination Neurologic: Grossly normal   Imaging/Diagnostics: X-rays reviewed, displaced and shortened left intertrochanteric hip fracture.   Labs: I have reviewed all of the pertinent labs in the patient chart. WBC  Date Value Ref Range Status  02/10/2023 5.7 4.0 - 10.5 K/uL Final   Hemoglobin  Date Value Ref Range Status  02/10/2023 12.3 12.0 - 15.0 g/dL Final   HGB  Date Value Ref  Range Status  07/06/2012 12.5 12.0 - 16.0 g/dL Final   HCT  Date Value Ref Range Status  02/10/2023 36.7 36.0 - 46.0 % Final  06/18/2012 42.2 35.0 - 47.0 % Final   Sodium  Date Value Ref Range Status  02/10/2023 137 135 - 145 mmol/L Final  07/06/2012 142 136 - 145 mmol/L Final   Potassium  Date Value Ref Range Status  02/10/2023 4.3 3.5 - 5.1 mmol/L Final  07/06/2012 4.5 3.5 - 5.1 mmol/L Final   Chloride  Date Value Ref Range Status  02/10/2023 101 98 - 111 mmol/L Final  07/06/2012 111 (H) 98 - 107 mmol/L Final   BUN  Date Value Ref Range Status  02/10/2023 34 (H) 8 - 23 mg/dL Final  56/21/3086 17 7 - 18 mg/dL Final   INR  Date Value Ref Range Status  06/18/2012 0.9  Final    Comment:    INR reference interval applies to patients on anticoagulant therapy. A single INR therapeutic range for coumarins is not optimal for all indications; however, the suggested range for most indications is 2.0 - 3.0. Exceptions to the INR Reference Range may include: Prosthetic heart valves, acute myocardial infarction, prevention of myocardial infarction, and combinations of aspirin and anticoagulant. The need for a higher or lower target INR must be assessed individually. Reference: The Pharmacology and Management of the Vitamin K  antagonists: the seventh ACCP Conference on Antithrombotic and Thrombolytic Therapy. Chest.2004 Sept:126 (3suppl): [id number]. A HCT value >55% may artifactually increase the PT.  In one study,  the increase was an average of 25%. Reference:  "Effect on Routine and Special Coagulation Testing Values of Citrate Anticoagulant Adjustment in Patients with High HCT Values." American Journal of Clinical Pathology 2006;126:400-405.    B Natriuretic Peptide  Date Value Ref Range Status  02/04/2017 452.0 (H) 0.0 - 100.0 pg/mL Final   TSH  Date Value Ref Range Status  02/04/2017 1.489 0.350 - 4.500 uIU/mL Final    Comment:    Performed by a 3rd Generation  assay with a functional sensitivity of <=0.01 uIU/mL.       Assessment/Plan:  89 year old female ground-level fall today with a left intertrochanteric hip fracture.  I met with the patient and the patient's brother and the patient's sister-in-law and we discussed the nature of the injury.  I reviewed hip fracture and I reviewed the pros and cons and risks and benefits of surgical versus nonoperative measures.  She normally ambulates with an assistive device such as a cane and sometimes a walker,  and she lives alone independently.  Because of that surgery is recommended.  As a community ambulator is important for her to avoid the risks of prolonged bedrest immobilization which would occur if the patient did not have surgery.  Essentially she would not be able to get out of bed because of the pain and being stuck in bed has significant risks such as pneumonia and decubiti and bedsores and DVT and PE and generalized deconditioning.  Surgery is recommended for early mobilization ambulation and stability which will help with pain.  There are risks of surgery as well and I explained that in general he is a high risk injuries that could have surgical risks and postoperative risks and risks of anesthesia and some of these do include bleeding and infection and blood clots and injury to nearby structures and problems with the bone and problems with the metal and problems with soft tissue healing, other risks include stress on the heart that can cause a heart attack or in the brain that can cause a stroke or blood clots or even pulmonary emboli.  Any of these problems can lead to increased morbidity and certainly increased mortality.  These are not mild injuries they are high risk injuries given her advanced age medical comorbidities and lower capacity for healing.  I explained this all to the patient and her family.  They asked the appropriate questions to demonstrate their understanding of the conversation.  I  reviewed the x-rays with them as well.  At the end of the conversation the patient and family desire to proceed with surgery.  Patient will be admitted to the hospitalist team for optimization for surgery tomorrow September 21. - Please consider placing a Foley as it is very painful as she moves around. - Please keep n.p.o. after midnight. -Hold chemical DVT prophylaxis. - Will schedule for surgery on Saturday, September 21. Currently scheduled 8 AM September 21  Thank you.  Please call with any questions or concerns, [phone]      Signed: Nada Boozer, MD, Orthopedic Surgeon Locums  9/20/20243:46 PM  Note: Dictation was assisted using commercial voice recognition software.There may be uncorrected typographical errors within the document.  Please be aware of this information when reading or considering this document. Thank you.

## 2023-02-10 NOTE — ED Notes (Signed)
Pt to xray

## 2023-02-10 NOTE — ED Provider Notes (Signed)
[facility] Provider Note    Event Date/Time   First MD Initiated Contact with Patient 02/10/23 1312     (approximate)   History   Fall   HPI  Cheryl Rios is a 89 y.o. female with a history of diabetes, hypertension, hypothyroidism, osteoporosis, OSA, and arthritis who presents with left hip pain and a head injury after a fall from standing height.  The patient states that she was in a parking lot when she tripped and fell onto her left side.  She hit her head but did not lose consciousness.  She denies feeling dizzy or lightheaded before the fall.  She denies any neck or back pain.  She states that she has not been able to put weight on the left leg.  I reviewed the past medical records; the patient has no recent ED visits or admissions.  Her most recent outpatient encounter was with pulmonology for evaluation of her OSA on 9/9.   Physical Exam   Triage Vital Signs: ED Triage Vitals  Encounter Vitals Group     BP      Systolic BP Percentile      Diastolic BP Percentile      Pulse      Resp      Temp      Temp src      SpO2      Weight      Height      Head Circumference      Peak Flow      Pain Score      Pain Loc      Pain Education      Exclude from Growth Chart     Most recent vital signs: Vitals:   02/10/23 1321 02/10/23 1630  BP: (!) 120/55 (!) 140/58  Pulse: 96 90  Resp: 18 16  Temp: (!) 97.4 F (36.3 C) 98 F (36.7 C)  SpO2: 90% 92%     General: Awake, no distress.  CV:  Good peripheral perfusion.  Resp:  Normal effort.  Abd:  No distention.  Other:  LLE shortened and rotated.  Pain on any ROM of left hip.  No midline cervical spinal tenderness.  Motor intact in all extremities.  Normal speech.   ED Results / Procedures / Treatments   Labs (all labs ordered are listed, but only abnormal results are displayed) Labs Reviewed  BASIC METABOLIC PANEL - Abnormal; Notable for the following components:      Result  Value   Glucose, Bld 184 (*)    BUN 34 (*)    GFR, Estimated 53 (*)    All other components within normal limits  CBC WITH DIFFERENTIAL/PLATELET - Abnormal; Notable for the following components:   RBC 3.82 (*)    RDW 15.8 (*)    All other components within normal limits  HEMOGLOBIN A1C     EKG  ED ECG REPORT I, Dionne Bucy, the attending physician, personally viewed and interpreted this ECG.  Date: 02/10/2023 EKG Time: 1325 Rate: 93 Rhythm: normal sinus rhythm QRS Axis: normal Intervals: normal ST/T Wave abnormalities: normal Narrative Interpretation: Low voltage; no evidence of acute ischemia    RADIOLOGY  XR L hip: I independently viewed and interpreted the images; there is an intertrochanteric fracture  Chest x-ray: No focal consolidation or edema  CT head/cervical spine:  IMPRESSION:  1. No acute intracranial pathology.  2. No acute fracture or traumatic malalignment of the cervical  spine.  PROCEDURES:  Critical Care performed: No  Procedures   MEDICATIONS ORDERED IN ED: Medications  allopurinol (ZYLOPRIM) tablet 150 mg (has no administration in time range)  hydroxychloroquine (PLAQUENIL) tablet 200 mg (has no administration in time range)  metoprolol succinate (TOPROL-XL) 24 hr tablet 25 mg (has no administration in time range)  hydrALAZINE (APRESOLINE) tablet 10 mg (has no administration in time range)  DULoxetine (CYMBALTA) DR capsule 20 mg (has no administration in time range)  levothyroxine (SYNTHROID) tablet 75 mcg (has no administration in time range)  gabapentin (NEURONTIN) capsule 100 mg (has no administration in time range)  fluticasone (FLONASE) 50 MCG/ACT nasal spray 1 spray (has no administration in time range)  ketoconazole (NIZORAL) 2 % cream 1 Application (has no administration in time range)  HYDROcodone-acetaminophen (NORCO/VICODIN) 5-325 MG per tablet 1-2 tablet (has no administration in time range)  enoxaparin (LOVENOX)  injection 40 mg (has no administration in time range)  0.9 %  sodium chloride infusion (has no administration in time range)  HYDROmorphone (DILAUDID) injection 0.5 mg (has no administration in time range)  senna-docusate (Senokot-S) tablet 1 tablet (has no administration in time range)  ondansetron (ZOFRAN) injection 4 mg (has no administration in time range)  acetaminophen (TYLENOL) tablet 650 mg (has no administration in time range)  insulin aspart (novoLOG) injection 0-9 Units (has no administration in time range)  HYDROmorphone (DILAUDID) injection 0.5 mg (0.5 mg Intravenous Given 02/10/23 1355)     IMPRESSION / MDM / ASSESSMENT AND PLAN / ED COURSE  I reviewed the triage vital signs and the nursing notes.  89 year old female with PMH as noted above presents with left hip injury after a mechanical fall from standing height.  She also hit her head but did not lose consciousness.  Neurologic exam is nonfocal.  The left leg is shortened and rotated but neuro/vascular intact.  Differential diagnosis includes, but is not limited to, hip fracture, contusion, minor head injury, concussion, ICH.  The patient is not on any anticoagulation or antiplatelet agents.  We will obtain x-rays of the left hip, CT of the head and cervical spine, labs, and reassess.  Patient's presentation is most consistent with acute presentation with potential threat to life or bodily function.  The patient is on the cardiac monitor to evaluate for evidence of arrhythmia and/or significant heart rate changes.  ----------------------------------------- 3:12 PM on 02/10/2023 -----------------------------------------  X-ray shows an intertrochanteric hip fracture.  CT head and cervical spine are negative.  Lab workup is unremarkable with no leukocytosis or edema.  Electrolytes are normal.  I consulted and discussed the case with Dr. Clemencia Course from orthopedics who will evaluate the patient.  I then consulted Dr. Chipper Herb from  the hospitalist service; based on our discussion he agrees to evaluate the patient for admission.  FINAL CLINICAL IMPRESSION(S) / ED DIAGNOSES   Final diagnoses:  Closed fracture of left hip, initial encounter (HCC)  Injury of head, initial encounter     Rx / DC Orders   ED Discharge Orders     None        Note:  This document was prepared using Dragon voice recognition software and may include unintentional dictation errors.    Dionne Bucy, MD 02/10/23 [phone]

## 2023-02-10 NOTE — Plan of Care (Signed)

## 2023-02-10 NOTE — Plan of Care (Signed)

## 2023-02-10 NOTE — ED Triage Notes (Signed)
Pt arrives via [facility], from [employer] after a fall. Pt c/o's left hip pain and pain on the back left of her head where she hit her head when falling. Pt is unsure what caused the fall. Pt denies LOC. Pt is not taking thinners. During visual inspection, there is shortening of the left leg. VSS.

## 2023-02-11 ENCOUNTER — Inpatient Hospital Stay: Payer: Self-pay | Admitting: General Practice

## 2023-02-11 ENCOUNTER — Other Ambulatory Visit: Payer: Self-pay

## 2023-02-11 ENCOUNTER — Inpatient Hospital Stay: Payer: MEDICARE | Admitting: General Practice

## 2023-02-11 ENCOUNTER — Encounter
Admission: EM | Disposition: A | Discharge status: Skilled Nursing Facility | Payer: Self-pay | Source: Home / Self Care | Attending: Internal Medicine

## 2023-02-11 ENCOUNTER — Inpatient Hospital Stay: Payer: MEDICARE

## 2023-02-11 DIAGNOSIS — S72142A Displaced intertrochanteric fracture of left femur, initial encounter for closed fracture: Secondary | ICD-10-CM

## 2023-02-11 DIAGNOSIS — S72002A Fracture of unspecified part of neck of left femur, initial encounter for closed fracture: Secondary | ICD-10-CM | POA: Diagnosis not present

## 2023-02-11 HISTORY — PX: FEMUR IM NAIL: SHX1597

## 2023-02-11 LAB — CBC
HCT: 28.8 % — ABNORMAL LOW (ref 36.0–46.0)
Hemoglobin: 9.3 g/dL — ABNORMAL LOW (ref 12.0–15.0)
MCH: 31.3 pg (ref 26.0–34.0)
MCHC: 32.3 g/dL (ref 30.0–36.0)
MCV: 97 fL (ref 80.0–100.0)
Platelets: 193 10*3/uL (ref 150–400)
RBC: 2.97 MIL/uL — ABNORMAL LOW (ref 3.87–5.11)
RDW: 15.8 % — ABNORMAL HIGH (ref 11.5–15.5)
WBC: 13.3 10*3/uL — ABNORMAL HIGH (ref 4.0–10.5)
nRBC: 0.2 % (ref 0.0–0.2)

## 2023-02-11 LAB — ABO/RH: ABO/RH(D): A POS

## 2023-02-11 LAB — BLOOD GAS, VENOUS
Acid-base deficit: 3.9 mmol/L — ABNORMAL HIGH (ref 0.0–2.0)
Bicarbonate: 22.2 mmol/L (ref 20.0–28.0)
O2 Saturation: 78.6 %
Patient temperature: 37
pCO2, Ven: 43 mmHg — ABNORMAL LOW (ref 44–60)
pH, Ven: 7.32 (ref 7.25–7.43)
pO2, Ven: 42 mmHg (ref 32–45)

## 2023-02-11 LAB — PREPARE RBC (CROSSMATCH)

## 2023-02-11 LAB — GLUCOSE, CAPILLARY
Glucose-Capillary: 152 mg/dL — ABNORMAL HIGH (ref 70–99)
Glucose-Capillary: 217 mg/dL — ABNORMAL HIGH (ref 70–99)
Glucose-Capillary: 228 mg/dL — ABNORMAL HIGH (ref 70–99)
Glucose-Capillary: 192 mg/dL — ABNORMAL HIGH (ref 70–99)
Glucose-Capillary: 195 mg/dL — ABNORMAL HIGH (ref 70–99)

## 2023-02-11 LAB — BASIC METABOLIC PANEL
Anion gap: 7 (ref 5–15)
BUN: 30 mg/dL — ABNORMAL HIGH (ref 8–23)
CO2: 24 mmol/L (ref 22–32)
Calcium: 8.5 mg/dL — ABNORMAL LOW (ref 8.9–10.3)
Chloride: 107 mmol/L (ref 98–111)
Creatinine, Ser: 0.95 mg/dL (ref 0.44–1.00)
GFR, Estimated: 55 mL/min — ABNORMAL LOW (ref >60)
Glucose, Bld: 212 mg/dL — ABNORMAL HIGH (ref 70–99)
Potassium: 4.3 mmol/L (ref 3.5–5.1)
Sodium: 138 mmol/L (ref 135–145)

## 2023-02-11 LAB — HEMOGLOBIN AND HEMATOCRIT, BLOOD
HCT: 23.4 % — ABNORMAL LOW (ref 36.0–46.0)
Hemoglobin: 7.6 g/dL — ABNORMAL LOW (ref 12.0–15.0)

## 2023-02-11 SURGERY — INSERTION, INTRAMEDULLARY ROD, FEMUR
Anesthesia: Spinal | Site: Hip | Laterality: Left

## 2023-02-11 MED ORDER — PHENYLEPHRINE HCL-NACL 20-0.9 MG/250ML-% IV SOLN
INTRAVENOUS | Status: DC | PRN
  Administered 2023-02-11: 40 ug/min via INTRAVENOUS

## 2023-02-11 MED ORDER — FENTANYL CITRATE PF 50 MCG/ML IJ SOSY: 50.0000 ug | PREFILLED_SYRINGE | INTRAMUSCULAR | Status: DC | PRN

## 2023-02-11 MED ORDER — LIDOCAINE HCL (CARDIAC) PF 100 MG/5ML IV SOSY
PREFILLED_SYRINGE | INTRAVENOUS | Status: DC | PRN
  Administered 2023-02-11: 100 mg via INTRAVENOUS

## 2023-02-11 MED ORDER — LIDOCAINE HCL (PF) 2 % IJ SOLN
INTRAMUSCULAR | Status: AC
  Filled 2023-02-11: qty 5

## 2023-02-11 MED ORDER — DEXAMETHASONE SODIUM PHOSPHATE 4 MG/ML IJ SOLN
INTRAMUSCULAR | Status: AC
  Filled 2023-02-11: qty 2

## 2023-02-11 MED ORDER — MIDAZOLAM HCL 2 MG/2ML IJ SOLN
INTRAMUSCULAR | Status: AC
  Filled 2023-02-11: qty 2

## 2023-02-11 MED ORDER — BUPIVACAINE HCL (PF) 0.5 % IJ SOLN
INTRAMUSCULAR | Status: AC
  Filled 2023-02-11: qty 20

## 2023-02-11 MED ORDER — LACTATED RINGERS IV SOLN: INTRAVENOUS | Status: DC

## 2023-02-11 MED ORDER — CEFAZOLIN SODIUM 1 G IJ SOLR
INTRAMUSCULAR | Status: AC
  Filled 2023-02-11: qty 20

## 2023-02-11 MED ORDER — ADULT MULTIVITAMIN W/MINERALS CH
1.0000 | ORAL_TABLET | Freq: Every day | ORAL | Status: DC
  Administered 2023-02-12 – 2023-02-15 (×4): 1 via ORAL
  Filled 2023-02-11 (×4): qty 1

## 2023-02-11 MED ORDER — VASOPRESSIN 20 UNIT/ML IV SOLN
INTRAVENOUS | Status: AC
  Filled 2023-02-11: qty 1

## 2023-02-11 MED ORDER — TRANEXAMIC ACID-NACL 1000-0.7 MG/100ML-% IV SOLN
INTRAVENOUS | Status: DC | PRN
Start: 2023-02-11 — End: 2023-02-11
  Administered 2023-02-11: 1000 mg via INTRAVENOUS

## 2023-02-11 MED ORDER — GLUCERNA SHAKE PO LIQD
237.0000 mL | Freq: Three times a day (TID) | ORAL | Status: DC
  Administered 2023-02-12 – 2023-02-15 (×5): 237 mL via ORAL

## 2023-02-11 MED ORDER — OXYCODONE-ACETAMINOPHEN 5-325 MG PO TABS
1.0000 | ORAL_TABLET | Freq: Four times a day (QID) | ORAL | Status: DC | PRN
  Administered 2023-02-11: 1 via ORAL
  Filled 2023-02-11: qty 2

## 2023-02-11 MED ORDER — SUCCINYLCHOLINE CHLORIDE 200 MG/10ML IV SOSY
PREFILLED_SYRINGE | INTRAVENOUS | Status: DC | PRN
  Administered 2023-02-11: 120 mg via INTRAVENOUS

## 2023-02-11 MED ORDER — DEXAMETHASONE SODIUM PHOSPHATE 10 MG/ML IJ SOLN
INTRAMUSCULAR | Status: AC
  Filled 2023-02-11: qty 1

## 2023-02-11 MED ORDER — PHENTOLAMINE MESYLATE 5 MG IJ SOLR
5.0000 mg | Freq: Once | INTRAMUSCULAR | Status: AC
  Administered 2023-02-11: 5 mg via INTRAVENOUS
  Filled 2023-02-11: qty 5

## 2023-02-11 MED ORDER — CEFAZOLIN SODIUM-DEXTROSE 1-4 GM/50ML-% IV SOLN
1.0000 g | Freq: Three times a day (TID) | INTRAVENOUS | Status: AC
  Administered 2023-02-11 – 2023-02-12 (×3): 1 g via INTRAVENOUS
  Filled 2023-02-11 (×3): qty 50

## 2023-02-11 MED ORDER — TRANEXAMIC ACID-NACL 1000-0.7 MG/100ML-% IV SOLN
INTRAVENOUS | Status: AC
  Filled 2023-02-11: qty 100

## 2023-02-11 MED ORDER — VASOPRESSIN 20 UNIT/ML IV SOLN
INTRAVENOUS | Status: DC | PRN
Start: 2023-02-11 — End: 2023-02-11
  Administered 2023-02-11: 1 [IU] via INTRAVENOUS

## 2023-02-11 MED ORDER — HYDROMORPHONE HCL 1 MG/ML IJ SOLN
INTRAMUSCULAR | Status: AC
  Filled 2023-02-11: qty 1

## 2023-02-11 MED ORDER — BUPIVACAINE HCL (PF) 0.5 % IJ SOLN
INTRAMUSCULAR | Status: AC
  Filled 2023-02-11: qty 10

## 2023-02-11 MED ORDER — SODIUM CHLORIDE 0.9 % IV BOLUS
500.0000 mL | Freq: Once | INTRAVENOUS | Status: DC
Start: 2023-02-11 — End: 2023-02-11

## 2023-02-11 MED ORDER — BUPIVACAINE HCL (PF) 0.5 % IJ SOLN
INTRAMUSCULAR | Status: AC
  Filled 2023-02-11: qty 30

## 2023-02-11 MED ORDER — LIDOCAINE HCL (PF) 1 % IJ SOLN
INTRAMUSCULAR | Status: DC | PRN
Start: 2023-02-11 — End: 2023-02-11
  Administered 2023-02-11: 3 mL

## 2023-02-11 MED ORDER — PROPOFOL 10 MG/ML IV BOLUS
INTRAVENOUS | Status: DC | PRN
  Administered 2023-02-11: 100 mg via INTRAVENOUS

## 2023-02-11 MED ORDER — DEXAMETHASONE SODIUM PHOSPHATE 4 MG/ML IJ SOLN
INTRAMUSCULAR | Status: DC | PRN
Start: 2023-02-11 — End: 2023-02-11
  Administered 2023-02-11: 8 mg via PERINEURAL

## 2023-02-11 MED ORDER — LIDOCAINE HCL (PF) 2 % IJ SOLN
INTRAMUSCULAR | Status: AC
  Filled 2023-02-11: qty 10

## 2023-02-11 MED ORDER — HYDROMORPHONE HCL 1 MG/ML IJ SOLN: 0.5000 mg | INTRAMUSCULAR | Status: DC | PRN

## 2023-02-11 MED ORDER — ORAL CARE MOUTH RINSE: 15.0000 mL | Freq: Once | OROMUCOSAL | Status: AC

## 2023-02-11 MED ORDER — SODIUM CHLORIDE 0.9 % IV SOLN: INTRAVENOUS | Status: DC | PRN

## 2023-02-11 MED ORDER — PHENYLEPHRINE HCL-NACL 20-0.9 MG/250ML-% IV SOLN
INTRAVENOUS | Status: AC
  Filled 2023-02-11: qty 250

## 2023-02-11 MED ORDER — DEXAMETHASONE SODIUM PHOSPHATE 10 MG/ML IJ SOLN
INTRAMUSCULAR | Status: DC | PRN
  Administered 2023-02-11: 10 mg via INTRAVENOUS

## 2023-02-11 MED ORDER — BUPIVACAINE HCL (PF) 0.5 % IJ SOLN
INTRAMUSCULAR | Status: DC | PRN
Start: 2023-02-11 — End: 2023-02-11

## 2023-02-11 MED ORDER — PROPOFOL 10 MG/ML IV BOLUS
INTRAVENOUS | Status: AC
  Filled 2023-02-11: qty 20

## 2023-02-11 MED ORDER — PHENYLEPHRINE HCL (PRESSORS) 10 MG/ML IV SOLN
INTRAVENOUS | Status: DC | PRN
Start: 2023-02-11 — End: 2023-02-11
  Administered 2023-02-11: 200 ug via INTRAVENOUS
  Administered 2023-02-11: 100 ug via INTRAVENOUS
  Administered 2023-02-11: 80 ug via INTRAVENOUS
  Administered 2023-02-11 (×2): 200 ug via INTRAVENOUS
  Administered 2023-02-11: 80 ug via INTRAVENOUS
  Administered 2023-02-11 (×3): 200 ug via INTRAVENOUS

## 2023-02-11 MED ORDER — CEFAZOLIN SODIUM-DEXTROSE 2-3 GM-%(50ML) IV SOLR
INTRAVENOUS | Status: DC | PRN
Start: 2023-02-11 — End: 2023-02-11
  Administered 2023-02-11: 2 g via INTRAVENOUS

## 2023-02-11 MED ORDER — ONDANSETRON HCL 4 MG/2ML IJ SOLN
INTRAMUSCULAR | Status: DC | PRN
  Administered 2023-02-11: 4 mg via INTRAVENOUS

## 2023-02-11 MED ORDER — HYDROMORPHONE HCL 1 MG/ML IJ SOLN
INTRAMUSCULAR | Status: DC | PRN
Start: 2023-02-11 — End: 2023-02-11
  Administered 2023-02-11 (×2): .1 mg via INTRAVENOUS
  Administered 2023-02-11: .2 mg via INTRAVENOUS

## 2023-02-11 MED ORDER — BACITRACIN ZINC 500 UNIT/GM EX OINT
TOPICAL_OINTMENT | CUTANEOUS | Status: AC
  Filled 2023-02-11: qty 28.35

## 2023-02-11 MED ORDER — ACETAMINOPHEN 500 MG PO TABS
1000.0000 mg | ORAL_TABLET | Freq: Four times a day (QID) | ORAL | Status: DC | PRN
  Administered 2023-02-12 – 2023-02-14 (×5): 1000 mg via ORAL
  Filled 2023-02-11 (×5): qty 2

## 2023-02-11 MED ORDER — OXYCODONE-ACETAMINOPHEN 5-325 MG PO TABS: 1.0000 | ORAL_TABLET | Freq: Four times a day (QID) | ORAL | Status: DC | PRN

## 2023-02-11 MED ORDER — BACITRACIN ZINC 500 UNIT/GM EX OINT
TOPICAL_OINTMENT | CUTANEOUS | Status: DC | PRN
Start: 2023-02-11 — End: 2023-02-11
  Administered 2023-02-11: 1 via TOPICAL

## 2023-02-11 MED ORDER — PHENYLEPHRINE 80 MCG/ML (10ML) SYRINGE FOR IV PUSH (FOR BLOOD PRESSURE SUPPORT)
PREFILLED_SYRINGE | INTRAVENOUS | Status: AC
  Filled 2023-02-11: qty 10

## 2023-02-11 MED ORDER — NITROGLYCERIN 2 % TD OINT: 1.0000 [in_us] | TOPICAL_OINTMENT | Freq: Three times a day (TID) | TRANSDERMAL | Status: AC | PRN

## 2023-02-11 MED ORDER — LACTATED RINGERS IV BOLUS
500.0000 mL | Freq: Once | INTRAVENOUS | Status: AC
  Administered 2023-02-11: 500 mL via INTRAVENOUS

## 2023-02-11 MED ORDER — SODIUM CHLORIDE 0.9% IV SOLUTION: Freq: Once | INTRAVENOUS | Status: AC

## 2023-02-11 MED ORDER — CHLORHEXIDINE GLUCONATE 0.12 % MT SOLN
15.0000 mL | Freq: Once | OROMUCOSAL | Status: AC
  Administered 2023-02-11: 15 mL via OROMUCOSAL
  Filled 2023-02-11: qty 15

## 2023-02-11 MED ORDER — ONDANSETRON HCL 4 MG/2ML IJ SOLN
INTRAMUSCULAR | Status: AC
  Filled 2023-02-11: qty 2

## 2023-02-11 MED ORDER — 0.9 % SODIUM CHLORIDE (POUR BTL) OPTIME
TOPICAL | Status: DC | PRN
  Administered 2023-02-11: 2000 mL

## 2023-02-11 MED ORDER — BUPIVACAINE HCL (PF) 0.5 % IJ SOLN
INTRAMUSCULAR | Status: DC | PRN
Start: 2023-02-11 — End: 2023-02-11
  Administered 2023-02-11: 20 mL via PERINEURAL

## 2023-02-11 SURGICAL SUPPLY — 49 items
ADH LQ OCL WTPRF AMP STRL LF (MISCELLANEOUS) ×1
ADHESIVE MASTISOL STRL (MISCELLANEOUS) ×1 IMPLANT
APL PRP STRL LF DISP 70% ISPRP (MISCELLANEOUS) ×1
BIT DRILL AO GAMMA 4.2X180 (BIT) IMPLANT
BNDG CMPR 5X6 CHSV STRCH STRL (GAUZE/BANDAGES/DRESSINGS) ×1
BNDG COHESIVE 6X5 TAN ST LF (GAUZE/BANDAGES/DRESSINGS) ×1 IMPLANT
CHLORAPREP W/TINT 26 (MISCELLANEOUS) ×1 IMPLANT
COVER MAYO STAND STRL (DRAPES) ×1 IMPLANT
DRAPE C-ARM XRAY 36X54 (DRAPES) ×1 IMPLANT
DRAPE INCISE IOBAN 66X45 STRL (DRAPES) IMPLANT
DRAPE SHEET LG 3/4 BI-LAMINATE (DRAPES) IMPLANT
DRSG AQUACEL AG ADV 3.5X 4 (GAUZE/BANDAGES/DRESSINGS) IMPLANT
DRSG AQUACEL AG ADV 3.5X10 (GAUZE/BANDAGES/DRESSINGS) IMPLANT
DRSG AQUACEL AG ADV 3.5X14 (GAUZE/BANDAGES/DRESSINGS) IMPLANT
DRSG TELFA 4X3 1S NADH ST (GAUZE/BANDAGES/DRESSINGS) IMPLANT
ELECT REM PT RETURN 9FT ADLT (ELECTROSURGICAL) ×1 IMPLANT
ELECTRODE REM PT RTRN 9FT ADLT (ELECTROSURGICAL) IMPLANT
GAUZE XEROFORM 1X8 LF (GAUZE/BANDAGES/DRESSINGS) IMPLANT
GLOVE PI ORTHO PRO STRL 7.5 (GLOVE) ×2 IMPLANT
GOWN STRL REUS W/ TWL LRG LVL3 (GOWN DISPOSABLE) ×2 IMPLANT
GOWN STRL REUS W/TWL LRG LVL3 (GOWN DISPOSABLE) ×2
GUIDEROD T2 3X1000 (ROD) IMPLANT
K-WIRE 3.2X450M STR (WIRE) ×2 IMPLANT
KIT PATIENT CARE HANA TABLE (KITS) ×1 IMPLANT
KIT TURNOVER CYSTO (KITS) ×1 IMPLANT
KWIRE 3.2X450M STR (WIRE) IMPLANT
MANIFOLD NEPTUNE II (INSTRUMENTS) ×1 IMPLANT
MAT ABSORB FLUID 56X50 GRAY (MISCELLANEOUS) IMPLANT
NAIL LONG 13X340 125D TI LT (Nail) IMPLANT
NDL HYPO 21X1.5 SAFETY (NEEDLE) ×1 IMPLANT
NEEDLE HYPO 21X1.5 SAFETY (NEEDLE) ×1 IMPLANT
NS IRRIG 500ML POUR BTL (IV SOLUTION) ×1 IMPLANT
PACK HIP COMPR (MISCELLANEOUS) ×1 IMPLANT
PAD ARMBOARD 7.5X6 YLW CONV (MISCELLANEOUS) ×1 IMPLANT
REAMER SHAFT BIXCUT (INSTRUMENTS) IMPLANT
SCREW LAG GAMMA 3 TI 10.5X90MM (Screw) IMPLANT
SCREW LOCKING T2 F/T 5MMX45MM (Screw) IMPLANT
SCREW LOCKING THREADED 5X47.5 (Screw) IMPLANT
SPONGE T-LAP 18X18 ~~LOC~~+RFID (SPONGE) IMPLANT
STRIP CLOSURE SKIN 1/2X4 (GAUZE/BANDAGES/DRESSINGS) ×1 IMPLANT
SUT MNCRL AB 3-0 PS2 27 (SUTURE) ×1 IMPLANT
SUT VIC AB 1 CT1 36 (SUTURE) ×1 IMPLANT
SUT VIC AB 1 CTX 27 (SUTURE) IMPLANT
SUT VIC AB 2-0 SH 27 (SUTURE) ×1
SUT VIC AB 2-0 SH 27XBRD (SUTURE) ×1 IMPLANT
SYR 10ML LL (SYRINGE) ×1 IMPLANT
TRAP FLUID SMOKE EVACUATOR (MISCELLANEOUS) IMPLANT
TRAY FOLEY MTR SLVR 16FR STAT (SET/KITS/TRAYS/PACK) IMPLANT
WATER STERILE IRR 1000ML POUR (IV SOLUTION) ×1 IMPLANT

## 2023-02-11 NOTE — Progress Notes (Signed)
Orthopedic postop update:  Surgery went well.  Performed stabilization of left hip intertrochanteric fracture.  EBL 550  Postoperative orders reactivated, placed, and are confirmed for the following: -Ancef, 1 g x 3 doses every 8 hours -Postop x-rays in PACU -Postop H&H in PACU -PT, OT  -Weightbearing status: Weightbearing as tolerated -Diet -Daily CBC x 3 draws -Daily basic metabolic panel x 3 draws  Thank you  Anjelica Gorniak, ortho [facility]

## 2023-02-11 NOTE — Plan of Care (Signed)

## 2023-02-11 NOTE — Anesthesia Procedure Notes (Signed)
Anesthesia Regional Block: Fascia iliaca block   Pre-Anesthetic Checklist: , timeout performed,  Correct Patient, Correct Site, Correct Laterality,  Correct Procedure, Correct Position, site marked,  Risks and benefits discussed,  At surgeon's request and post-op pain management  Laterality: Left  Prep: chloraprep       Needles:  Injection technique: Single-shot      Needle Length: 9cm  Needle Gauge: 20     Additional Needles:   Procedures:,,,, ultrasound used (permanent image in chart),,    Narrative:  Start time: 02/11/2023 8:37 AM End time: 02/11/2023 8:48 AM  Performed by: Personally  Anesthesiologist: Marisue Humble, MD  Additional Notes: Positioned supine, see vital signs on nurse's notes, standard monitoring on patient for block.

## 2023-02-11 NOTE — Anesthesia Postprocedure Evaluation (Signed)
Anesthesia Post Note  Patient: Cheryl Rios  Procedure(s) Performed: INTRAMEDULLARY (IM) NAIL FEMORAL (Left: Hip)  Patient location during evaluation: PACU Anesthesia Type: Spinal Level of consciousness: awake and alert Pain management: pain level controlled Vital Signs Assessment: post-procedure vital signs reviewed and stable Respiratory status: spontaneous breathing, nonlabored ventilation, respiratory function stable and patient connected to nasal cannula oxygen Cardiovascular status: blood pressure returned to baseline and stable Postop Assessment: no apparent nausea or vomiting Anesthetic complications: no   No notable events documented.   Last Vitals:  Vitals:   02/11/23 2031 02/11/23 2151  BP: (!) 70/44 (!) 99/47  Pulse: 100 99  Resp: 16   Temp: 37 C (!) 36.4 C  SpO2: 94% 93%    Last Pain:  Vitals:   02/11/23 1552  TempSrc:   PainSc: 2                  Wyoma Genson C Donyelle Enyeart

## 2023-02-11 NOTE — Plan of Care (Signed)

## 2023-02-11 NOTE — Progress Notes (Addendum)
Patient came to OR with IV in right forearm. Site was clean, intact, no swelling noted.  Skin distal to IV insertion site was very mildly erythematous  This IV was used during surgery.  IV fluid flows well. Patient BP was "sagging" during surgery, which is very common, particularly in elderly patients and those who are dehydrated, and under general anesthesia or spinal anesthesia.  IV phenylephrine was appropriately administered, and dose of vasopressin.    Postop, right forearm and hand were noted to be cold and edematous, but good radial pulse, good capillary refill, hand is pink.  Able to aspirate a small amount of blood from IV tubing, but uncertain whether IV may be partially extravasated.   Due to the concerns of possible vesicant medication into tissue, discussed w/pharmacist and will administer phentolamine 5 mg next to the same IV site, so we will know the phentolamine is in same area as possible vessicant. Will add nitroglycerin ointment 'as needed" for next two days. Have notified ortho surgery Dr. Clemencia Course and we both inspected the site. Right forwarm to be kept warm and elevated. IV removed from right forearm. Patient now has IV in left forearm.  Phentolamine injected subcutaneous, not IV. Had planned IV administration, but due to concerns about possible hypotension, and BP 107/58, will only administer phentolamine subcutaneously, next to location of IV.  IV catheter removed after administration of phentolamine 5 mg; there was blood in the tip of IV catheter, so if infiltration occurred, would have likely been a partial infiltration, with fluids perhaps leaking around insertion site of IV catheter, as this patient has very fragile veins.  Forearm to be kept warm, and elevated on pillow for 2 days, and longer if needed, NTG ointment as needed every 8 hours.

## 2023-02-11 NOTE — Progress Notes (Signed)
CROSS COVER NOTE  NAME: Cheryl Rios MRN: [id number] DOB : May 15, 1926    Concern as stated by nurse / staff   "S: Pt BP is 70/44 HR 100 B: Postop day 0, left IM nail. A: BP was taken 3 separate times, remained 70/44. HOB Elevated. NAD R: Please advise. "    Pertinent findings on chart review: Patient had IM nailing left hip to repair hip fx sustained from mechanical fall. Chronic med conditions DM2 HTN, OSA on CPAP, hypothyroidism, RA and obesity  Assessment and  Interventions   Assessment:    02/11/2023    8:31 PM 02/11/2023    4:40 PM 02/11/2023    1:45 PM  Vitals with BMI  Systolic 70 82 94  Diastolic 44 45 48  Pulse 100 99 93   Bedside patient lethargic; but awakens able to state name and DOB, MAE Last narcotic pain med 1507 Pain left hip more with HOB up,, Currently with [facility] now reclined semi recumbent position pain is  5/10 CBG 192 Skin is warm, multiple covers in place, were removed Afebrile  Left hip with some edema no overt bleeding or hematoma Patient denies use of narcotics for pain at home   Plan: D/c oxycodone, 1 gm tylenol every 6h, prn dilaudid for severe pain still active 500 cc LR bolus  Check hgb  7.6 drop from 9.3 - transfuse 1 unit PRBCs Vbg  pH 7.32-pCO2 43; HCO3 22.2 Comply with cpap application at time of sleep       Donnie Mesa NP [facility] Cross Cover 7pm-7am - check amion for availability Pager [phone]

## 2023-02-11 NOTE — Anesthesia Procedure Notes (Signed)
Procedure Name: Intubation Date/Time: 02/11/2023 9:20 AM  Performed by: Lonia Mad, CRNAPre-anesthesia Checklist: Patient identified, Patient being monitored, Timeout performed, Emergency Drugs available and Suction available Patient Re-evaluated:Patient Re-evaluated prior to induction Oxygen Delivery Method: Circle system utilized Preoxygenation: Pre-oxygenation with 100% oxygen Induction Type: IV induction Ventilation: Mask ventilation without difficulty Laryngoscope Size: Mac and 3 Grade View: Grade I Tube type: Oral Tube size: 7.0 mm Number of attempts: 1 Airway Equipment and Method: Stylet Placement Confirmation: ETT inserted through vocal cords under direct vision, positive ETCO2 and breath sounds checked- equal and bilateral Secured at: 22 cm Tube secured with: Tape Dental Injury: Teeth and Oropharynx as per pre-operative assessment

## 2023-02-11 NOTE — Op Note (Signed)
02/11/2023  12:26 PM  PATIENT:  Cheryl Rios  89 y.o. female  PRE-OPERATIVE DIAGNOSIS:  left hip fracture -intertrochanteric   POST-OPERATIVE DIAGNOSIS:  left hip fracture -intertrochanteric  PROCEDURE: Surgical stabilization of left hip fracture with cephalomedullary nail  Implants, [employer]ker, 340 mm nail, 125 degree angle, 13 mm diameter, 90 mm lag screw, 2 distal interlocks  SURGEON:  Surgeons and Role:    * Fremon Zacharia, Adelfa Koh, MD - Primary  PHYSICIAN ASSISTANT: None  ASSISTANTS: none   ANESTHESIA:   general  EBL:  550   BLOOD ADMINISTERED:none  DRAINS: none   LOCAL MEDICATIONS USED:  NONE  SPECIMEN:  No Specimen  DISPOSITION OF SPECIMEN:  N/A  COUNTS:  YES  TOURNIQUET:  * No tourniquets in log *  DICTATION: .Dragon Dictation  PLAN OF CARE: Admit to inpatient   PATIENT DISPOSITION:  PACU - hemodynamically stable.   Delay start of Pharmacological VTE agent (>24hrs) due to surgical blood loss or risk of bleeding: yes   The patient was checked in by the surgical team.  I met with her and her brother and her sister-in-law and we reviewed the diagnosis the surgical plan for today and answered all questions.  Consent was obtained witnessed and verified.  Patient was taken back into the operating room and given anesthesia.  General anesthesia was administered after the spinal was on successful, antibiotics were given, TXA was administered, the patient's lower extremity was prepped and draped in standard sterile fashion, a timeout was done, the case then began.  Orthopedic reduction manipulation maneuvers improved significantly alignment of the fracture however the femoral neck was still significantly angulated anteriorly at the start of the case.  Incision was made in line with the fascia and a K wire was placed down the femur using standard technique.  I confirmed good placement of the K wire and AP and lateral projections.  Guidewire was placed we measured for the  insertion of a 340 mm nail.  Her canal was pretty open and so we reamed to a 15 for the insertion of a 13 mm nail.  At this point we went back to reduction of the femoral neck and are closed attempts and attempts with manual manipulation were not successful therefore my incision was extended distally I was able to get reduction clamp on the femoral neck reduce it so that it was near anatomically aligned.  At this point a guidewire was placed and found to be in good position sitting just a little bit inferiorly and just a little bit posteriorly.  We measured for the insertion of a 90 mm lag screw.  Drilling was done the lag screw was inserted and traction was let off at this time and compression was done.  We felt and visualize good compression at this point the reduction clamp was removed the fracture was stable in its position and we locked the nail together with the lag screw proximally.  Turned my attention distally to perfect circle technique for the insertion of distal screws.  We achieved good x-ray perfect circle visualization.  Incision was made.  2 drill holes were made we measured for the proximal of the 2 screws to be a 45 mm screw in the distal was a 40 7.5 millimeter screw.  They were inserted using standard technique and AL guidance and fluoroscopic help.  At this point final AP and lateral projections of the hip and knee were done showing the fracture well reduced and hardware in good position.  Final fluoroscopic images were saved.  We copiously irrigated all the wounds.  We used over a liter and a half normal saline to irrigate all the wounds.  All the wounds were then closed in layers.  The deep wounds were closed with #1 Vicryl.  The more superficial deep wound was closed with #1 Vicryl.  The skin dermal layer was closed with 2-0 Vicryl and at the surface a 3-0 running Monocryl suture was placed.  Aquacel Ag surgical bandages were placed to cover all wounds.  There were 3 total  wounds.  There was blood loss throughout the case which was significant and measured at 550 mm.  The end of the case there was no bleeding and the bleeding during the case was a steady ooze without any sign of an arterial bleed.  Patient tolerated the procedure well.  There were no complications.  Patient is readmitted back to the hospitalist team and orthopedic is following in consultation.  A H&H is ordered in the recovery room as are postoperative x-rays.  WBAT LLE   Thank you.  Tricia Pledger, ortho [facility]

## 2023-02-11 NOTE — Transfer of Care (Signed)
Immediate Anesthesia Transfer of Care Note  Patient: Cheryl Rios  Procedure(s) Performed: INTRAMEDULLARY (IM) NAIL FEMORAL (Left: Hip)  Patient Location: PACU  Anesthesia Type:General  Level of Consciousness: unresponsive  Airway & Oxygen Therapy: Patient Spontanous Breathing and Patient connected to nasal cannula oxygen  Post-op Assessment: Report given to RN  Post vital signs: Reviewed and stable  Last Vitals:  Vitals Value Taken Time  BP 127/82   Temp 98.32F   Pulse 88 02/11/23 1222  Resp 26 02/11/23 1222  SpO2 100 % 02/11/23 1222  Vitals shown include unfiled device data.  Last Pain:  Vitals:   02/11/23 0740  TempSrc:   PainSc: 3       Patients Stated Pain Goal: 2 (02/10/23 1705)  Complications: No notable events documented.

## 2023-02-11 NOTE — Progress Notes (Addendum)
PROGRESS NOTE    Cheryl Rios  UJW:[id number] DOB: [birth year] DOA: 02/10/2023 PCP: Jerl Mina, MD   Assessment & Plan:   Principal Problem:   Hip fracture [facility]) Active Problems:   OSA on CPAP   Hypertension   Hyperlipidemia, unspecified   Closed left hip fracture, initial encounter (HCC)  Assessment and Plan: Left hip fracture: secondary to mechanical fall. Will go for ORIF today. Percocet, dilaudid prn for pain. Ortho surg following and recs apprec    Fall: continue w/ fall precautions. CT head and neck negative for intracranial bleeding, fracture or dislocation   Osteopenia: continue on vitamin D supplement   HTN: continue on metoprolol. Holding lisinopril, lasix   DM2: likely poorly controlled. Continue on SSI w/ accuchecks    Hypothyroidism: continue on synthroid    RA: continue on home dose of hydroxychloroquine & qweekly methotrexate   OSA: CPAP qhs  Obesity: BMI 34. Would benefit from weight loss      DVT prophylaxis: SCDs Code Status: full  Family Communication: discussed pt's care w/ pt's family at bedside and answered their questions Disposition Plan: depends on PT/OT recs (not consulted yet)  Level of care: Med-Surg Status is: Inpatient Remains inpatient appropriate because: severity of illness    Consultants:  Ortho surg   Procedures:   Antimicrobials:  Subjective: Pt c/o hip pain   Objective: Vitals:   02/10/23 1321 02/10/23 1324 02/10/23 1630 02/10/23 2333  BP: (!) 120/55  (!) 140/58 (!) 142/75  Pulse: 96  90 95  Resp: 18  16 18   Temp: (!) 97.4 F (36.3 C)  98 F (36.7 C) 97.6 F (36.4 C)  TempSrc: Oral     SpO2: 90%  92% 98%  Weight:  83.9 kg    Height:  5' 1.5" (1.562 m)      Intake/Output Summary (Last 24 hours) at 02/11/2023 0818 Last data filed at 02/11/2023 0610 Gross per 24 hour  Intake 713.98 ml  Output 300 ml  Net 413.98 ml   Filed Weights   02/10/23 1324  Weight: 83.9 kg    Examination:  General  exam: Appears calm and comfortable  Respiratory system: Clear to auscultation. Respiratory effort normal. Cardiovascular system: S1 & S2+. No rubs, gallops or clicks. Gastrointestinal system: Abdomen is nondistended, soft and nontender. . Normal bowel sounds heard. Central nervous system: Alert and awake. Moves all extremities Psychiatry: Judgement and insight appears at baseline. Flat mood and affect    Data Reviewed: I have personally reviewed following labs and imaging studies  CBC: Recent Labs  Lab 02/10/23 1333  WBC 5.7  NEUTROABS 4.1  HGB 12.3  HCT 36.7  MCV 96.1  PLT 190   Basic Metabolic Panel: Recent Labs  Lab 02/10/23 1333  NA 137  K 4.3  CL 101  CO2 25  GLUCOSE 184*  BUN 34*  CREATININE 0.98  CALCIUM 9.2   GFR: Estimated Creatinine Clearance: 34.2 mL/min (by C-G formula based on SCr of 0.98 mg/dL). Liver Function Tests: No results for input(s): "AST", "ALT", "ALKPHOS", "BILITOT", "PROT", "ALBUMIN" in the last 168 hours. No results for input(s): "LIPASE", "AMYLASE" in the last 168 hours. No results for input(s): "AMMONIA" in the last 168 hours. Coagulation Profile: No results for input(s): "INR", "PROTIME" in the last 168 hours. Cardiac Enzymes: No results for input(s): "CKTOTAL", "CKMB", "CKMBINDEX", "TROPONINI" in the last 168 hours. BNP (last 3 results) No results for input(s): "PROBNP" in the last 8760 hours. HbA1C: Recent Labs    02/10/23  1635  HGBA1C 6.2*   CBG: Recent Labs  Lab 02/10/23 1723 02/10/23 2126 02/11/23 0737  GLUCAP 206* 208* 152*   Lipid Profile: No results for input(s): "CHOL", "HDL", "LDLCALC", "TRIG", "CHOLHDL", "LDLDIRECT" in the last 72 hours. Thyroid Function Tests: No results for input(s): "TSH", "T4TOTAL", "FREET4", "T3FREE", "THYROIDAB" in the last 72 hours. Anemia Panel: No results for input(s): "VITAMINB12", "FOLATE", "FERRITIN", "TIBC", "IRON", "RETICCTPCT" in the last 72 hours. Sepsis Labs: No results for  input(s): "PROCALCITON", "LATICACIDVEN" in the last 168 hours.  No results found for this or any previous visit (from the past 240 hour(s)).       Radiology Studies: CT Head Wo Contrast  Result Date: 02/10/2023 CLINICAL DATA:  Fall, head and neck pain. EXAM: CT HEAD WITHOUT CONTRAST CT CERVICAL SPINE WITHOUT CONTRAST TECHNIQUE: Multidetector CT imaging of the head and cervical spine was performed following the standard protocol without intravenous contrast. Multiplanar CT image reconstructions of the cervical spine were also generated. RADIATION DOSE REDUCTION: This exam was performed according to the departmental dose-optimization program which includes automated exposure control, adjustment of the mA and/or kV according to patient size and/or use of iterative reconstruction technique. COMPARISON:  None Available. FINDINGS: CT HEAD FINDINGS Brain: There is no acute intracranial hemorrhage, extra-axial fluid collection, or acute infarct Parenchymal volume is within expected limits for age. The ventricles are normal in size. Gray-white differentiation is preserved The pituitary and suprasellar region are normal. There is no mass lesion there is no mass effect or midline shift. Vascular: There is calcification of the bilateral carotid siphons. Skull: Normal. Negative for fracture or focal lesion. Sinuses/Orbits: The paranasal sinuses are clear. Bilateral lens implants are in place. The globes and orbits are otherwise unremarkable. Other: The mastoid air cells and middle ear cavities are clear. CT CERVICAL SPINE FINDINGS Alignment: There is no significant antero or retrolisthesis. There is no jumped or perched facet or other evidence of traumatic malalignment. Skull base and vertebrae: Skull base alignment is maintained. Vertebral body heights are preserved there is no evidence of acute fracture. There is no suspicious osseous lesion. Soft tissues and spinal canal: No prevertebral fluid or swelling. No  visible canal hematoma. Disc levels: There is bulky degenerative pannus about the dens with mild narrowing of the craniocervical junction. There is moderate disc space narrowing and degenerative endplate change at C5-C6 and bulky multilevel facet arthropathy. There is no evidence of high-grade spinal canal stenosis. Upper chest: The imaged lung apices are clear. Other: None. IMPRESSION: 1. No acute intracranial pathology. 2. No acute fracture or traumatic malalignment of the cervical spine. Electronically Signed   By: Lesia Hausen M.D.   On: 02/10/2023 15:33   CT Cervical Spine Wo Contrast  Result Date: 02/10/2023 CLINICAL DATA:  Fall, head and neck pain. EXAM: CT HEAD WITHOUT CONTRAST CT CERVICAL SPINE WITHOUT CONTRAST TECHNIQUE: Multidetector CT imaging of the head and cervical spine was performed following the standard protocol without intravenous contrast. Multiplanar CT image reconstructions of the cervical spine were also generated. RADIATION DOSE REDUCTION: This exam was performed according to the departmental dose-optimization program which includes automated exposure control, adjustment of the mA and/or kV according to patient size and/or use of iterative reconstruction technique. COMPARISON:  None Available. FINDINGS: CT HEAD FINDINGS Brain: There is no acute intracranial hemorrhage, extra-axial fluid collection, or acute infarct Parenchymal volume is within expected limits for age. The ventricles are normal in size. Gray-white differentiation is preserved The pituitary and suprasellar region are normal. There is  no mass lesion there is no mass effect or midline shift. Vascular: There is calcification of the bilateral carotid siphons. Skull: Normal. Negative for fracture or focal lesion. Sinuses/Orbits: The paranasal sinuses are clear. Bilateral lens implants are in place. The globes and orbits are otherwise unremarkable. Other: The mastoid air cells and middle ear cavities are clear. CT CERVICAL SPINE  FINDINGS Alignment: There is no significant antero or retrolisthesis. There is no jumped or perched facet or other evidence of traumatic malalignment. Skull base and vertebrae: Skull base alignment is maintained. Vertebral body heights are preserved there is no evidence of acute fracture. There is no suspicious osseous lesion. Soft tissues and spinal canal: No prevertebral fluid or swelling. No visible canal hematoma. Disc levels: There is bulky degenerative pannus about the dens with mild narrowing of the craniocervical junction. There is moderate disc space narrowing and degenerative endplate change at C5-C6 and bulky multilevel facet arthropathy. There is no evidence of high-grade spinal canal stenosis. Upper chest: The imaged lung apices are clear. Other: None. IMPRESSION: 1. No acute intracranial pathology. 2. No acute fracture or traumatic malalignment of the cervical spine. Electronically Signed   By: Lesia Hausen M.D.   On: 02/10/2023 15:33   DG Chest 1 View  Result Date: 02/10/2023 CLINICAL DATA:  Preop.  Hip fracture EXAM: CHEST  1 VIEW portable supine COMPARISON:  02/06/2017 FINDINGS: Film is rotated to the left. Calcified and tortuous aorta. Prominent calcifications along the tracheobronchial tree. Stable cardiopericardial silhouette. Interstitial lung changes. Apical pleural thickening. No consolidation, pneumothorax or effusion. Overlapping cardiac leads. Degenerative changes of the spine with osteopenia IMPRESSION: Rotated radiograph. Chronic changes. No acute cardiopulmonary disease Electronically Signed   By: Karen Kays M.D.   On: 02/10/2023 15:04   DG Hip Unilat With Pelvis 2-3 Views Left  Result Date: 02/10/2023 CLINICAL DATA:  Pain after fall EXAM: DG HIP (WITH OR WITHOUT PELVIS) 3V LEFT COMPARISON:  None Available. FINDINGS: Comminuted intertrochanteric hip fracture with foreshortening and displaced fragments. Severe osteopenia. Mild joint space loss of the hips and sacroiliac joints.  Hyperostosis. Moderate degenerative changes of the spine at the edge of the imaging field. Diffuse colonic stool. IMPRESSION: Comminuted foreshortened, displaced and angulated intertrochanteric left hip fracture. Osteopenia with degenerative changes. Electronically Signed   By: Karen Kays M.D.   On: 02/10/2023 15:03        Scheduled Meds:  allopurinol  150 mg Oral Daily   DULoxetine  40 mg Oral Daily   gabapentin  200 mg Oral TID   insulin aspart  0-9 Units Subcutaneous TID WC   ketoconazole  1 Application Topical Daily   levothyroxine  75 mcg Oral Q0600   metoprolol succinate  25 mg Oral Daily   Continuous Infusions:   LOS: 1 day       Charise Killian, MD [facility] Pager [phone]  If 7PM-7AM, please contact night-coverage [email or web address] 02/11/2023, 8:18 AM

## 2023-02-11 NOTE — Progress Notes (Signed)
Orthopedic progress note  Chief complaint left hip fracture  Subjective, no acute events since admission  Objective, chart reviewed  Exam is stable, tenderness over the left hip area, no active range of motion due to pain, there is no sign of expanding hematoma at the fracture site, moving the ankle foot and toes well with BCR, silt, skin intact   Assessment and plan  89 year old female left hip intertrochanteric fracture.  We discussed everything yesterday and scheduled her for surgery this morning.  We are on-call to the OR.  I met with the patient and her family in the preoperative area to discuss again the diagnosis and the details of surgery and we went over the risks and benefits of surgery.  Benefit is early stabilization and early mobilization to avoid the high risks of prolonged bedrest immobilization.  I explained again that this is considered a high risk injury because of the patient's comorbidities and advanced age with lower capacity to heal in general.  Risks include bleeding and infection and blood clots and injury to nearby structures and problems with the bone healing and/or fracture, problems with the hardware, there could be slow or delayed healing, there is an expectation of healing between 6 and 12 months after surgery like this.  Other risks include stress on the heart and lungs and this can cause increased morbidity and even increased risk of mortality.  We discussed this the patient had an understanding of the conversation and she did not not have any new questions today.  The brother and the sister-in-law also was or part of the conversation they did not have any additional questions today as well.  Consent was obtained witnessed and verified.  Are on-call to the OR 2g ancef in OR TXA via IV in OR  Thank you  Kirin Pastorino, ortho [facility]

## 2023-02-11 NOTE — Progress Notes (Signed)
Initial Nutrition Assessment  DOCUMENTATION CODES:   Obesity unspecified  INTERVENTION:   -Once diet is advanced, add:   -Glucerna Shake po TID, each supplement provides 220 kcal and 10 grams of protein  -MVI with minerals daily  NUTRITION DIAGNOSIS:   Increased nutrient needs related to post-op healing as evidenced by estimated needs.  GOAL:   Patient will meet greater than or equal to 90% of their needs  MONITOR:   PO intake, Supplement acceptance, Diet advancement  REASON FOR ASSESSMENT:   Consult Assessment of nutrition requirement/status, Hip fracture protocol  ASSESSMENT:   Pt with medical history significant of HTN, HLD, rheumatoid arthritis, IIDM on diet control, peripheral neuropathy, OSA on CPAP at bedtime, hypothyroidism, osteoporosis, morbid obesity, admitted for fall and left hip pain.  Pt admitted with lt hip fracture s/p mechanical fall.   Reviewed I/O's: +414 ml x 24 hours  UOP: 300 ml x 24 hours  Pt unavailable at time of visit. Attempted to speak with pt via call to hospital room phone, however, unable to reach. RD unable to obtain further nutrition-related history or complete nutrition-focused physical exam at this time.     Per orthopedics notes, pt is NPO for ORIF today.   Reviewed wt hx; pt has experienced a 1.6% wt loss over the past year, which is not significant for time frame.  Pt with increased nutritional needs for post-op healing and would benefit from addition of oral nutrition supplements.   Medications reviewed and include neurontin.  Lab Results  Component Value Date   HGBA1C 6.2 (H) 02/10/2023   PTA DM medications are none.   Labs reviewed: CBGS: 152-208 (inpatient orders for glycemic control are 0-9 units insulin aspart TID with meals).    Diet Order:   Diet Order             Diet NPO time specified  Diet effective midnight                   EDUCATION NEEDS:   No education needs have been identified at this  time  Skin:  Skin Assessment: Reviewed RN Assessment  Last BM:  02/09/23  Height:   Ht Readings from Last 1 Encounters:  02/10/23 5' 1.5" (1.562 m)    Weight:   Wt Readings from Last 1 Encounters:  02/10/23 83.9 kg    Ideal Body Weight:  48.9 kg  BMI:  Body mass index is 34.39 kg/m.  Estimated Nutritional Needs:   Kcal:  1450-1650  Protein:  70-85 grams  Fluid:  > 1.5 L    Levada Schilling, RD, LDN, CDCES Registered Dietitian II Certified Diabetes Care and Education Specialist Please refer to [facility] for RD and/or RD on-call/weekend/after hours pager

## 2023-02-11 NOTE — Anesthesia Preprocedure Evaluation (Addendum)
Anesthesia Evaluation  Patient identified by MRN, date of birth, ID band Patient awake    Reviewed: Allergy & Precautions, H&P , NPO status , Patient's Chart, lab work & pertinent test results  Airway Mallampati: IV  TM Distance: <3 FB Neck ROM: Full    Dental no notable dental hx. (+) Poor Dentition Has few lower teeth, edentulous upper:   Pulmonary sleep apnea and Continuous Positive Airway Pressure Ventilation    Pulmonary exam normal breath sounds clear to auscultation       Cardiovascular hypertension, Normal cardiovascular exam+ dysrhythmias  Rhythm:Regular Rate:Normal     Neuro/Psych negative neurological ROS  negative psych ROS   GI/Hepatic negative GI ROS, Neg liver ROS,,,  Endo/Other  diabetesHypothyroidism    Renal/GU Renal disease  negative genitourinary   Musculoskeletal  (+) Arthritis ,    Abdominal   Peds negative pediatric ROS (+)  Hematology negative hematology ROS (+)   Anesthesia Other Findings Hypertension  Osteoporosis Diabetes mellitus without complication Neuropathy Diverticulosis  Arthritis HOH (hard of hearing)  Thyroid disease Edema of both feet  Arrhythmia Hypothyroidism  Dysrhythmia Sleep apnea  Chronic gouty arthritis Elevated LFTs Hx "frequent PVCs"\ Last lovenox yesterday around 1630, so well past 12 hours since injection.   Reproductive/Obstetrics negative OB ROS                             Anesthesia Physical Anesthesia Plan  ASA: 3  Anesthesia Plan: Spinal   Post-op Pain Management: Regional block* and Minimal or no pain anticipated   Induction:   PONV Risk Score and Plan: 1 and Treatment may vary due to age or medical condition  Airway Management Planned: Nasal CPAP  Additional Equipment:   Intra-op Plan:   Post-operative Plan:   Informed Consent:   Plan Discussed with: CRNA  Anesthesia Plan Comments: (Plan is for spinal  anesthetic, with nasal oxygen cannula, or nasal CPAP.  If spinal not achieved, would progress to general anesthesia with ETT, plan use of McGrath if intubation needed, as patient has Mallampati IV with short TMD. )        Anesthesia Quick Evaluation

## 2023-02-11 NOTE — Brief Op Note (Signed)
02/11/2023  12:26 PM  PATIENT:  Cheryl Rios  89 y.o. female  PRE-OPERATIVE DIAGNOSIS:  left hip fracture -intertrochanteric   POST-OPERATIVE DIAGNOSIS:  left hip fracture -intertrochanteric  PROCEDURE: Surgical stabilization of left hip fracture with cephalomedullary nail  Implants, Stryker, 340 mm nail, 125 degree angle, 13 mm diameter, 90 mm lag screw, 2 distal interlocks  SURGEON:  Surgeons and Role:    * Tagen Milby, Adelfa Koh, MD - Primary  PHYSICIAN ASSISTANT: None  ASSISTANTS: none   ANESTHESIA:   general  EBL:  550   BLOOD ADMINISTERED:none  DRAINS: none   LOCAL MEDICATIONS USED:  NONE  SPECIMEN:  No Specimen  DISPOSITION OF SPECIMEN:  N/A  COUNTS:  YES  TOURNIQUET:  * No tourniquets in log *  DICTATION: .Dragon Dictation  PLAN OF CARE: Admit to inpatient   PATIENT DISPOSITION:  PACU - hemodynamically stable.   Delay start of Pharmacological VTE agent (>24hrs) due to surgical blood loss or risk of bleeding: yes

## 2023-02-12 DIAGNOSIS — S72002A Fracture of unspecified part of neck of left femur, initial encounter for closed fracture: Secondary | ICD-10-CM | POA: Diagnosis not present

## 2023-02-12 LAB — CBC WITH DIFFERENTIAL/PLATELET
Abs Immature Granulocytes: 0.09 10*3/uL — ABNORMAL HIGH (ref 0.00–0.07)
Basophils Absolute: 0 10*3/uL (ref 0.0–0.1)
Basophils Relative: 0 %
Eosinophils Absolute: 0 10*3/uL (ref 0.0–0.5)
Eosinophils Relative: 0 %
HCT: 24.3 % — ABNORMAL LOW (ref 36.0–46.0)
Hemoglobin: 8.2 g/dL — ABNORMAL LOW (ref 12.0–15.0)
Immature Granulocytes: 1 %
Lymphocytes Relative: 9 %
Lymphs Abs: 1.1 10*3/uL (ref 0.7–4.0)
MCH: 31.4 pg (ref 26.0–34.0)
MCHC: 33.7 g/dL (ref 30.0–36.0)
MCV: 93.1 fL (ref 80.0–100.0)
Monocytes Absolute: 0.6 10*3/uL (ref 0.1–1.0)
Monocytes Relative: 6 %
Neutro Abs: 9.6 10*3/uL — ABNORMAL HIGH (ref 1.7–7.7)
Neutrophils Relative %: 84 %
Platelets: 134 10*3/uL — ABNORMAL LOW (ref 150–400)
RBC: 2.61 MIL/uL — ABNORMAL LOW (ref 3.87–5.11)
RDW: 15.8 % — ABNORMAL HIGH (ref 11.5–15.5)
WBC: 11.4 10*3/uL — ABNORMAL HIGH (ref 4.0–10.5)
nRBC: 0.3 % — ABNORMAL HIGH (ref 0.0–0.2)

## 2023-02-12 LAB — BASIC METABOLIC PANEL
Anion gap: 9 (ref 5–15)
BUN: 45 mg/dL — ABNORMAL HIGH (ref 8–23)
CO2: 22 mmol/L (ref 22–32)
Calcium: 8.1 mg/dL — ABNORMAL LOW (ref 8.9–10.3)
Chloride: 105 mmol/L (ref 98–111)
Creatinine, Ser: 1.55 mg/dL — ABNORMAL HIGH (ref 0.44–1.00)
GFR, Estimated: 31 mL/min — ABNORMAL LOW (ref >60)
Glucose, Bld: 200 mg/dL — ABNORMAL HIGH (ref 70–99)
Potassium: 4.7 mmol/L (ref 3.5–5.1)
Sodium: 136 mmol/L (ref 135–145)

## 2023-02-12 LAB — GLUCOSE, CAPILLARY
Glucose-Capillary: 140 mg/dL — ABNORMAL HIGH (ref 70–99)
Glucose-Capillary: 161 mg/dL — ABNORMAL HIGH (ref 70–99)
Glucose-Capillary: 161 mg/dL — ABNORMAL HIGH (ref 70–99)
Glucose-Capillary: 189 mg/dL — ABNORMAL HIGH (ref 70–99)

## 2023-02-12 LAB — HEMOGLOBIN AND HEMATOCRIT, BLOOD
HCT: 23.7 % — ABNORMAL LOW (ref 36.0–46.0)
Hemoglobin: 7.8 g/dL — ABNORMAL LOW (ref 12.0–15.0)

## 2023-02-12 LAB — PREPARE RBC (CROSSMATCH)

## 2023-02-12 MED ORDER — SODIUM CHLORIDE 0.9% IV SOLUTION: Freq: Once | INTRAVENOUS | Status: DC

## 2023-02-12 MED ORDER — SODIUM CHLORIDE 0.9 % IV BOLUS
1000.0000 mL | Freq: Once | INTRAVENOUS | Status: AC
  Administered 2023-02-12: 1000 mL via INTRAVENOUS

## 2023-02-12 NOTE — Progress Notes (Signed)
Patient IV infiltrated with 10.90mL of blood to finish. Pharmacist and MD notified, IV site has some ecchymosis, swollen and warm to touch, ICE pack applied. No c/o pain at this time

## 2023-02-12 NOTE — Progress Notes (Signed)
Physical Therapy Evaluation Patient Details Name: Cheryl Rios MRN: [id number] DOB: 02-25-1926 Today's Date: 02/12/2023  History of Present Illness  Pt is a 89 y/o female admitted after a mechanical fall in which she sustained a L hip fx s/p cephalomedullary nail. PMH including but not limited to  Clinical Impression  Pt presented supine in bed with HOB elevated, awake and willing to participate in therapy session. Prior to admission, pt reported that she was independent with ADLs and mod I with ambulation using a cane/RW. Pt lives alone in a single level condo with "several" steps to enter. At the time of evaluation, pt very limited with functional mobility secondary to pain, weakness and very soft BP's. She required max A x2 for bed mobility and mod A x2 for sit<>stand transfers from EOB with RW. Pt's BP in sitting prior to standing was 97/54 mmHg, and in sitting after standing her BP was 70/52 mmHg. Pt's RN was notified. Pt would continue to benefit from skilled physical therapy services at this time while admitted and after d/c to address the below listed limitations in order to improve overall safety and independence with functional mobility.         If plan is discharge home, recommend the following: A lot of help with walking and/or transfers;Two people to help with walking and/or transfers;A lot of help with bathing/dressing/bathroom;Assistance with cooking/housework;Supervision due to cognitive status;Help with stairs or ramp for entrance;Assist for transportation   Can travel by private vehicle   No    Equipment Recommendations Other (comment) (defer to next venue of care)  Recommendations for Other Services       Functional Status Assessment Patient has had a recent decline in their functional status and demonstrates the ability to make significant improvements in function in a reasonable and predictable amount of time.     Precautions / Restrictions Precautions Precautions:  Fall Restrictions Weight Bearing Restrictions: Yes LLE Weight Bearing: Weight bearing as tolerated      Mobility  Bed Mobility Overal bed mobility: Needs Assistance Bed Mobility: Supine to Sit, Sit to Supine     Supine to sit: Max assist, +2 for physical assistance, HOB elevated, Used rails Sit to supine: Max assist, +2 for physical assistance   General bed mobility comments: increased time and effort, multimodal cueing for sequencing, heavy physical assistance at bilateral LEs and trunk to complete with use of bed pads to position pt's hips at EOB    Transfers Overall transfer level: Needs assistance Equipment used: Rolling walker (2 wheels) Transfers: Sit to/from Stand Sit to Stand: Mod assist, +2 physical assistance           General transfer comment: increased time and effort, cueing for safe hand placement and technique, mod A x2 to power into standing and vc'ing to achieve a full upright standing position    Ambulation/Gait               General Gait Details: unable to tolerate  Stairs            Wheelchair Mobility     Tilt Bed    Modified Rankin (Stroke Patients Only)       Balance Overall balance assessment: Needs assistance Sitting-balance support: Feet supported Sitting balance-Leahy Scale: Fair     Standing balance support: Reliant on assistive device for balance, During functional activity, Bilateral upper extremity supported Standing balance-Leahy Scale: Poor  Pertinent Vitals/Pain Pain Assessment Pain Assessment: Faces Faces Pain Scale: Hurts even more Pain Location: L hip Pain Descriptors / Indicators: Guarding, Grimacing Pain Intervention(s): Monitored during session, Repositioned    Home Living Family/patient expects to be discharged to:: Private residence Living Arrangements: Alone Available Help at Discharge: Family;Friend(s) Type of Home: Other(Comment) (condo) Home Access:  Stairs to enter Entrance Stairs-Rails: Right;Left;Can reach both Entrance Stairs-Number of Steps: "several"   Home Layout: One level Home Equipment: Agricultural consultant (2 wheels);Cane - single point;BSC/3in1      Prior Function Prior Level of Function : Independent/Modified Independent             Mobility Comments: uses a cane to ambulate ADLs Comments: independent     Extremity/Trunk Assessment   Upper Extremity Assessment Upper Extremity Assessment: Defer to OT evaluation    Lower Extremity Assessment Lower Extremity Assessment: LLE deficits/detail LLE Deficits / Details: Pt with decreased strength and ROM limitations secondary to post-op pain and weakness LLE: Unable to fully assess due to pain       Communication   Communication Communication: Hearing impairment  Cognition Arousal: Alert Behavior During Therapy: WFL for tasks assessed/performed Overall Cognitive Status: Impaired/Different from baseline Area of Impairment: Safety/judgement, Problem solving                         Safety/Judgement: Decreased awareness of safety, Decreased awareness of deficits   Problem Solving: Difficulty sequencing, Requires verbal cues, Requires tactile cues          General Comments      Exercises     Assessment/Plan    PT Assessment Patient needs continued PT services  PT Problem List Decreased strength;Decreased range of motion;Decreased activity tolerance;Decreased balance;Decreased mobility;Decreased coordination;Decreased cognition;Decreased safety awareness;Decreased knowledge of use of DME;Decreased knowledge of precautions;Pain       PT Treatment Interventions DME instruction;Gait training;Stair training;Functional mobility training;Therapeutic activities;Therapeutic exercise;Balance training;Neuromuscular re-education;Cognitive remediation;Patient/family education    PT Goals (Current goals can be found in the Care Plan section)  Acute Rehab PT  Goals Patient Stated Goal: decrease pain PT Goal Formulation: With patient Time For Goal Achievement: 02/26/23 Potential to Achieve Goals: Good    Frequency Min 1X/week     Co-evaluation PT/OT/SLP Co-Evaluation/Treatment: Yes Reason for Co-Treatment: For patient/therapist safety;To address functional/ADL transfers PT goals addressed during session: Mobility/safety with mobility;Balance;Proper use of DME;Strengthening/ROM         AM-PAC PT "6 Clicks" Mobility  Outcome Measure Help needed turning from your back to your side while in a flat bed without using bedrails?: A Lot Help needed moving from lying on your back to sitting on the side of a flat bed without using bedrails?: A Lot Help needed moving to and from a bed to a chair (including a wheelchair)?: A Lot Help needed standing up from a chair using your arms (e.g., wheelchair or bedside chair)?: A Lot Help needed to walk in hospital room?: Total Help needed climbing 3-5 steps with a railing? : Total 6 Click Score: 10    End of Session Equipment Utilized During Treatment: Gait belt Activity Tolerance: Patient limited by pain Patient left: in bed;with call bell/phone within reach;with bed alarm set;with SCD's reapplied Nurse Communication: Mobility status PT Visit Diagnosis: Other abnormalities of gait and mobility (R26.89)    Time: 8413-2440 PT Time Calculation (min) (ACUTE ONLY): 23 min   Charges:   PT Evaluation $PT Eval Moderate Complexity: 1 Mod   PT General Charges $$ ACUTE  PT VISIT: 1 Visit         Ginette Pitman, PT, DPT  Acute Rehabilitation Services Office [phone]   Alessandra Bevels Janicia Monterrosa 02/12/2023, 1:26 PM

## 2023-02-12 NOTE — Plan of Care (Addendum)
Patient A/O x4, BP low throughout the day, MD notified, bolus given early this morning. HGB dropped to 7.8,1 unit of PRBCs administered. No new drainage on post op dressing.  Problem: Education: Goal: Ability to describe self-care measures that may prevent or decrease complications (Diabetes Survival Skills Education) will improve Outcome: Progressing Goal: Individualized Educational Video(s) Outcome: Progressing   Problem: Coping: Goal: Ability to adjust to condition or change in health will improve Outcome: Progressing   Problem: Fluid Volume: Goal: Ability to maintain a balanced intake and output will improve Outcome: Progressing   Problem: Health Behavior/Discharge Planning: Goal: Ability to identify and utilize available resources and services will improve Outcome: Progressing Goal: Ability to manage health-related needs will improve Outcome: Progressing   Problem: Metabolic: Goal: Ability to maintain appropriate glucose levels will improve Outcome: Progressing   Problem: Nutritional: Goal: Maintenance of adequate nutrition will improve Outcome: Progressing Goal: Progress toward achieving an optimal weight will improve Outcome: Progressing   Problem: Skin Integrity: Goal: Risk for impaired skin integrity will decrease Outcome: Progressing   Problem: Tissue Perfusion: Goal: Adequacy of tissue perfusion will improve Outcome: Progressing   Problem: Education: Goal: Knowledge of General Education information will improve Description: Including pain rating scale, medication(s)/side effects and non-pharmacologic comfort measures Outcome: Progressing   Problem: Health Behavior/Discharge Planning: Goal: Ability to manage health-related needs will improve Outcome: Progressing   Problem: Clinical Measurements: Goal: Ability to maintain clinical measurements within normal limits will improve Outcome: Progressing Goal: Will remain free from infection Outcome:  Progressing Goal: Diagnostic test results will improve Outcome: Progressing Goal: Respiratory complications will improve Outcome: Progressing Goal: Cardiovascular complication will be avoided Outcome: Progressing   Problem: Activity: Goal: Risk for activity intolerance will decrease Outcome: Progressing   Problem: Nutrition: Goal: Adequate nutrition will be maintained Outcome: Progressing   Problem: Coping: Goal: Level of anxiety will decrease Outcome: Progressing   Problem: Elimination: Goal: Will not experience complications related to bowel motility Outcome: Progressing Goal: Will not experience complications related to urinary retention Outcome: Progressing   Problem: Pain Managment: Goal: General experience of comfort will improve Outcome: Progressing   Problem: Safety: Goal: Ability to remain free from injury will improve Outcome: Progressing   Problem: Skin Integrity: Goal: Risk for impaired skin integrity will decrease Outcome: Progressing

## 2023-02-12 NOTE — Progress Notes (Signed)
Orthopedic progress note  Chief complaint left hip fracture intertrochanteric  Subjective, the patient was hypotensive overnight requiring bolus and 1 unit of packed red blood cells. Subjective, patient is awake sitting up in bed having breakfast.  Reports to be feeling well this morning.  No shortness of breath.  No chest pain  Objective, chart reviewed Postoperative her hemoglobin was 9.3, in the evening yesterday was 7.6, she received 1 unit of packed red blood cells and the repeat hemoglobin this morning was 8.2 Postoperative x-rays yesterday were taken reviewed by myself and look good  The operative bandage overall is clean and dry without any signs of active bleeding.  The distal part is completely pristine and there is a very small spotting on the mid aspect of the main dressing.  The thigh in the surgical area is soft and compressible.  Clinically there is no sign of active bleeding at the surgical site. Moving the ankle foot and toes well.  BCR.  Silt     Assessment and plan  89 year old female who had a ground-level fall and sustained a left hip intertrochanteric fracture who is status post surgical stabilization with cephalomedullary nail on February 11, 2023, now postoperative day #1.  She has acute blood loss anemia.  This is likely combination of dilutional effect and also blood loss and surgery.  Blood loss and surgery was 550cc.  I have placed an order for repeat CBC at noon.  Recommend additional transfusion prn to keep the hemoglobin greater than 8.  She should work with therapy today, weightbearing as tolerated, as long as she remains hemodynamically stable.  Recommending holding all chemical DVT prophylaxis at this time due to risk of bleeding and acute blood loss anemia currently.  SCDs are in place.  No active chemical DVT prophylaxis orders are placed at this time.  Thank you  Dayanara Sherrill, [facility]

## 2023-02-12 NOTE — Progress Notes (Signed)
Ortho Update  Repeat H/H 7.8/23.  Patient getting a unit of PRBCs.  This is her 2nd unit since surgery.  Dressing looks the same.  No signs of active post op bleeding.   Will round in AM.  Am CBC scheduled.    Thank you   Stephanne Greeley, [facility]

## 2023-02-12 NOTE — Evaluation (Signed)
Occupational Therapy Evaluation Patient Details Name: Cheryl Rios MRN: [id number] DOB: January 09, 1926 Today's Date: 02/12/2023   History of Present Illness Pt is a 89 y/o female admitted after a mechanical fall in which she sustained a L hip fx s/p cephalomedullary nail. PMH including but not limited to   Clinical Impression   Patient presenting with decreased Ind in self care, balance, functional mobility/transfers, endurance, and safety awareness. Patient reports being mod I at baseline with use of RW for functional mobility. Pt lives at home along but has some family that intermittently check in on her if she needs something. Pt needing max A of 2 for bed mobility and stands with mod A of 2. Attempts made at side steps but unable to off load and steps with L LE this session and noted to have posterior bias in standing. Pt remains asymptomatic but after standing BP dropped to 70/52. Pt returned to supine with +2 assistance. Call bell and all needed items within reach. Patient will benefit from acute OT to increase overall independence in the areas of ADLs, functional mobility, and safety awareness in order to safely discharge.      If plan is discharge home, recommend the following: Two people to help with walking and/or transfers;Two people to help with bathing/dressing/bathroom;Assistance with cooking/housework;Assist for transportation;Help with stairs or ramp for entrance    Functional Status Assessment  Patient has had a recent decline in their functional status and demonstrates the ability to make significant improvements in function in a reasonable and predictable amount of time.  Equipment Recommendations  Other (comment) (defer to next venue of care)       Precautions / Restrictions Precautions Precautions: Fall Restrictions Weight Bearing Restrictions: Yes LLE Weight Bearing: Weight bearing as tolerated      Mobility Bed Mobility Overal bed mobility: Needs Assistance Bed  Mobility: Supine to Sit, Sit to Supine     Supine to sit: Max assist, +2 for physical assistance, HOB elevated, Used rails Sit to supine: Max assist, +2 for physical assistance   General bed mobility comments: increased time and effort, multimodal cueing for sequencing, heavy physical assistance at bilateral LEs and trunk to complete with use of bed pads to position pt's hips at EOB    Transfers Overall transfer level: Needs assistance   Transfers: Sit to/from Stand Sit to Stand: Mod assist, +2 physical assistance           General transfer comment: increased time and effort, cueing for safe hand placement and technique, mod A x2 to power into standing and vc'ing to achieve a full upright standing position      Balance Overall balance assessment: Needs assistance Sitting-balance support: Feet supported Sitting balance-Leahy Scale: Fair     Standing balance support: Reliant on assistive device for balance, During functional activity, Bilateral upper extremity supported Standing balance-Leahy Scale: Poor                             ADL either performed or assessed with clinical judgement   ADL Overall ADL's : Needs assistance/impaired Eating/Feeding: Modified independent;Bed level   Grooming: Wash/dry hands;Wash/dry face;Sitting;Set up;Supervision/safety                                 General ADL Comments: pt is unable to take steps for [facility] transferduring session     Vision Patient Visual Report: No  change from baseline              Pertinent Vitals/Pain Pain Assessment Pain Assessment: Faces Faces Pain Scale: Hurts even more Pain Location: L hip Pain Descriptors / Indicators: Guarding, Grimacing Pain Intervention(s): Monitored during session, Repositioned     Extremity/Trunk Assessment Upper Extremity Assessment Upper Extremity Assessment: Generalized weakness   Lower Extremity Assessment Lower Extremity Assessment: Defer to PT  evaluation LLE Deficits / Details: Pt with decreased strength and ROM limitations secondary to post-op pain and weakness LLE: Unable to fully assess due to pain       Communication Communication Communication: Hearing impairment Cueing Techniques: Verbal cues   Cognition Arousal: Alert Behavior During Therapy: WFL for tasks assessed/performed Overall Cognitive Status: Impaired/Different from baseline Area of Impairment: Safety/judgement, Problem solving                         Safety/Judgement: Decreased awareness of safety, Decreased awareness of deficits   Problem Solving: Difficulty sequencing, Requires verbal cues, Requires tactile cues General Comments: Pt is pleasant and cooperative throughout session                Home Living Family/patient expects to be discharged to:: Private residence Living Arrangements: Alone Available Help at Discharge: Family;Friend(s);Available PRN/intermittently Type of Home: Other(Comment) (condo) Home Access: Stairs to enter Entrance Stairs-Number of Steps: "several" Entrance Stairs-Rails: Right;Left;Can reach both Home Layout: One level     Bathroom Shower/Tub: Sponge bathes at baseline         Home Equipment: Agricultural consultant (2 wheels);Cane - single point;BSC/3in1          Prior Functioning/Environment Prior Level of Function : Independent/Modified Independent             Mobility Comments: uses a cane to ambulate ADLs Comments: independent with ADLs and most IADLs. Her brother and sister in law come by or call her daily to "check in" on her.        OT Problem List: Decreased strength;Decreased activity tolerance;Decreased safety awareness;Decreased knowledge of use of DME or AE;Impaired balance (sitting and/or standing)      OT Treatment/Interventions: Self-care/ADL training;Therapeutic exercise;Therapeutic activities;Energy conservation;DME and/or AE instruction;Patient/family education;Balance training     OT Goals(Current goals can be found in the care plan section) Acute Rehab OT Goals Patient Stated Goal: to get stronger OT Goal Formulation: With patient Time For Goal Achievement: 02/26/23 Potential to Achieve Goals: Fair ADL Goals Pt Will Perform Grooming: with supervision;sitting Pt Will Perform Lower Body Dressing: with min assist;sitting/lateral leans Pt Will Transfer to Toilet: with min assist;ambulating Pt Will Perform Toileting - Clothing Manipulation and hygiene: with min assist;sit to/from stand  OT Frequency: Min 1X/week    Co-evaluation PT/OT/SLP Co-Evaluation/Treatment: Yes Reason for Co-Treatment: For patient/therapist safety;To address functional/ADL transfers PT goals addressed during session: Mobility/safety with mobility;Balance;Proper use of DME;Strengthening/ROM OT goals addressed during session: ADL's and self-care;Proper use of Adaptive equipment and DME      AM-PAC OT "6 Clicks" Daily Activity     Outcome Measure Help from another person eating meals?: None Help from another person taking care of personal grooming?: A Little Help from another person toileting, which includes using toliet, bedpan, or urinal?: Total Help from another person bathing (including washing, rinsing, drying)?: A Lot Help from another person to put on and taking off regular upper body clothing?: A Lot Help from another person to put on and taking off regular lower body clothing?: Total 6 Click Score:  13   End of Session Equipment Utilized During Treatment: Rolling walker (2 wheels) Nurse Communication: Mobility status  Activity Tolerance: Patient tolerated treatment well Patient left: in bed;with call bell/phone within reach;with bed alarm set  OT Visit Diagnosis: Unsteadiness on feet (R26.81);Repeated falls (R29.6);Muscle weakness (generalized) (M62.81)                Time: 1242-1310 OT Time Calculation (min): 28 min Charges:  OT General Charges $OT Visit: 1 Visit OT  Evaluation $OT Eval Moderate Complexity: 1 [address], [place], OTR/L , [facility] ascom [phone]  02/12/23, 1:40 PM

## 2023-02-12 NOTE — Progress Notes (Signed)
PROGRESS NOTE    Cheryl Rios  XBM:[id number] DOB: November 22, 1925 DOA: 02/10/2023 PCP: Jerl Mina, MD   Assessment & Plan:   Principal Problem:   Hip fracture [facility]) Active Problems:   OSA on CPAP   Hypertension   Hyperlipidemia, unspecified   Closed left hip fracture, initial encounter (HCC)  Assessment and Plan: Left hip fracture: secondary to mechanical fall. S/p surg stabilization of left hip w/ cephalomedullary nail. Percocet, diluadid prn for pain. Ortho surg following and recs apprec    Acute blood loss anemia: secondary to above recent surg. S/p 1 unit of pRBCs transfused so far and will give 1 unit more of pRBCs. Goal to keep Hb >8 as per ortho surg.    Fall: continue w/ fall precautions. CT head and neck negative for intracranial bleeding, fracture or dislocation   Osteopenia: continue on vitamin D supplement   HTN: hold metoprolol for MAP < 65 and/or HR < 65. Holding lisinopril, lasix   DM2: likely poorly controlled. Continue on SSI w/ accuchecks    Hypothyroidism: continue on levothyroxine    RA: continue on home dose of hydroxychloroquine, qweekly methotrexate   OSA: CPAP qhs  Obesity: BMI 34. Would benefit from weight loss      DVT prophylaxis: SCDs Code Status: full  Family Communication: discussed pt's care w/ pt's family at bedside and answered their questions Disposition Plan: depends on PT/OT recs (not consulted yet)  Level of care: Med-Surg Status is: Inpatient Remains inpatient appropriate because: severity of illness    Consultants:  Ortho surg   Procedures:   Antimicrobials:  Subjective: Pt c/o malaise   Objective: Vitals:   02/12/23 0040 02/12/23 0315 02/12/23 0623 02/12/23 0743  BP: (!) 81/37 (!) 90/46 (!) 88/41 (!) 79/44  Pulse: 92 90 85 84  Resp: 16 16  18   Temp: 98.6 F (37 C) 98.6 F (37 C) 98.2 F (36.8 C) 98.2 F (36.8 C)  TempSrc:  Oral  Oral  SpO2:  91% 92% 92%  Weight:      Height:        Intake/Output  Summary (Last 24 hours) at 02/12/2023 0853 Last data filed at 02/12/2023 0701 Gross per 24 hour  Intake 1435.54 ml  Output 1400 ml  Net 35.54 ml   Filed Weights   02/10/23 1324  Weight: 83.9 kg    Examination:  General exam: Appears calm but uncomfortable  Respiratory system: decreased breath sounds b/l  Cardiovascular system: S1/S2+. No rubs or clicks  Gastrointestinal system: abd is soft, NT, obese & hypoactive bowel sounds Central nervous system: alert & awake. Moves all extremities Psychiatry: Judgement and insight appears at baseline. Flat mood and affect     Data Reviewed: I have personally reviewed following labs and imaging studies  CBC: Recent Labs  Lab 02/10/23 1333 02/11/23 1249 02/11/23 2116 02/12/23 0418  WBC 5.7 13.3*  --  11.4*  NEUTROABS 4.1  --   --  9.6*  HGB 12.3 9.3* 7.6* 8.2*  HCT 36.7 28.8* 23.4* 24.3*  MCV 96.1 97.0  --  93.1  PLT 190 193  --  134*   Basic Metabolic Panel: Recent Labs  Lab 02/10/23 1333 02/11/23 1249 02/12/23 0418  NA 137 138 136  K 4.3 4.3 4.7  CL 101 107 105  CO2 25 24 22   GLUCOSE 184* 212* 200*  BUN 34* 30* 45*  CREATININE 0.98 0.95 1.55*  CALCIUM 9.2 8.5* 8.1*   GFR: Estimated Creatinine Clearance: 21.6 mL/min (A) (by  C-G formula based on SCr of 1.55 mg/dL (H)). Liver Function Tests: No results for input(s): "AST", "ALT", "ALKPHOS", "BILITOT", "PROT", "ALBUMIN" in the last 168 hours. No results for input(s): "LIPASE", "AMYLASE" in the last 168 hours. No results for input(s): "AMMONIA" in the last 168 hours. Coagulation Profile: No results for input(s): "INR", "PROTIME" in the last 168 hours. Cardiac Enzymes: No results for input(s): "CKTOTAL", "CKMB", "CKMBINDEX", "TROPONINI" in the last 168 hours. BNP (last 3 results) No results for input(s): "PROBNP" in the last 8760 hours. HbA1C: Recent Labs    02/10/23 1635  HGBA1C 6.2*   CBG: Recent Labs  Lab 02/11/23 1220 02/11/23 1623 02/11/23 2046  02/11/23 2104 02/12/23 0745  GLUCAP 217* 228* 195* 192* 161*   Lipid Profile: No results for input(s): "CHOL", "HDL", "LDLCALC", "TRIG", "CHOLHDL", "LDLDIRECT" in the last 72 hours. Thyroid Function Tests: No results for input(s): "TSH", "T4TOTAL", "FREET4", "T3FREE", "THYROIDAB" in the last 72 hours. Anemia Panel: No results for input(s): "VITAMINB12", "FOLATE", "FERRITIN", "TIBC", "IRON", "RETICCTPCT" in the last 72 hours. Sepsis Labs: No results for input(s): "PROCALCITON", "LATICACIDVEN" in the last 168 hours.  No results found for this or any previous visit (from the past 240 hour(s)).       Radiology Studies: DG HIP UNILAT WITH PELVIS 2-3 VIEWS LEFT  Result Date: 02/11/2023 CLINICAL DATA:  [id number] Surgery, elective [id number] EXAM: DG HIP (WITH OR WITHOUT PELVIS) 2-3V LEFT COMPARISON:  February 10, 2023 FINDINGS: Spot fluoroscopy images were obtained for surgical planning purposes. Spot fluoroscopy images demonstrate placement an intramedullary rod. Persistent displacement of the lesser trochanter. Time: 4 minutes 42 seconds Dose: 83.28 mGy Please reference procedure report for further details. IMPRESSION: Spot fluoroscopy images for surgical planning purposes. Electronically Signed   By: Meda Klinefelter M.D.   On: 02/11/2023 15:14   DG FEMUR PORT MIN 2 VIEWS LEFT  Result Date: 02/11/2023 CLINICAL DATA:  Status post femoral nail EXAM: LEFT FEMUR PORTABLE 2 VIEWS COMPARISON:  Preoperative hip radiographs 1 day prior FINDINGS: There has been interval intramedullary nail fixation of the left intertrochanteric fracture with improved fracture alignment. Hardware alignment is within expected limits, without evidence of complication. Postsurgical changes reflecting left knee arthroplasty are also noted without evidence of complication. Femoroacetabular alignment is maintained. IMPRESSION: Status post intramedullary nail fixation of the intertrochanteric fracture with improved alignment and  no evidence of complication. Electronically Signed   By: Lesia Hausen M.D.   On: 02/11/2023 13:30   DG C-Arm 1-60 Min-No Report  Result Date: 02/11/2023 Fluoroscopy was utilized by the requesting physician.  No radiographic interpretation.   DG C-Arm 1-60 Min-No Report  Result Date: 02/11/2023 Fluoroscopy was utilized by the requesting physician.  No radiographic interpretation.   DG C-Arm 1-60 Min-No Report  Result Date: 02/11/2023 Fluoroscopy was utilized by the requesting physician.  No radiographic interpretation.   [place] OR NERVE BLOCK-IMAGE ONLY [facility])  Result Date: 02/11/2023 There is no interpretation for this exam.  This order is for images obtained during a surgical procedure.  Please See "Surgeries" Tab for more information regarding the procedure.   CT Head Wo Contrast  Result Date: 02/10/2023 CLINICAL DATA:  Fall, head and neck pain. EXAM: CT HEAD WITHOUT CONTRAST CT CERVICAL SPINE WITHOUT CONTRAST TECHNIQUE: Multidetector CT imaging of the head and cervical spine was performed following the standard protocol without intravenous contrast. Multiplanar CT image reconstructions of the cervical spine were also generated. RADIATION DOSE REDUCTION: This exam was performed according to the departmental dose-optimization program which includes  automated exposure control, adjustment of the mA and/or kV according to patient size and/or use of iterative reconstruction technique. COMPARISON:  None Available. FINDINGS: CT HEAD FINDINGS Brain: There is no acute intracranial hemorrhage, extra-axial fluid collection, or acute infarct Parenchymal volume is within expected limits for age. The ventricles are normal in size. Gray-white differentiation is preserved The pituitary and suprasellar region are normal. There is no mass lesion there is no mass effect or midline shift. Vascular: There is calcification of the bilateral carotid siphons. Skull: Normal. Negative for fracture or focal lesion.  Sinuses/Orbits: The paranasal sinuses are clear. Bilateral lens implants are in place. The globes and orbits are otherwise unremarkable. Other: The mastoid air cells and middle ear cavities are clear. CT CERVICAL SPINE FINDINGS Alignment: There is no significant antero or retrolisthesis. There is no jumped or perched facet or other evidence of traumatic malalignment. Skull base and vertebrae: Skull base alignment is maintained. Vertebral body heights are preserved there is no evidence of acute fracture. There is no suspicious osseous lesion. Soft tissues and spinal canal: No prevertebral fluid or swelling. No visible canal hematoma. Disc levels: There is bulky degenerative pannus about the dens with mild narrowing of the craniocervical junction. There is moderate disc space narrowing and degenerative endplate change at C5-C6 and bulky multilevel facet arthropathy. There is no evidence of high-grade spinal canal stenosis. Upper chest: The imaged lung apices are clear. Other: None. IMPRESSION: 1. No acute intracranial pathology. 2. No acute fracture or traumatic malalignment of the cervical spine. Electronically Signed   By: Lesia Hausen M.D.   On: 02/10/2023 15:33   CT Cervical Spine Wo Contrast  Result Date: 02/10/2023 CLINICAL DATA:  Fall, head and neck pain. EXAM: CT HEAD WITHOUT CONTRAST CT CERVICAL SPINE WITHOUT CONTRAST TECHNIQUE: Multidetector CT imaging of the head and cervical spine was performed following the standard protocol without intravenous contrast. Multiplanar CT image reconstructions of the cervical spine were also generated. RADIATION DOSE REDUCTION: This exam was performed according to the departmental dose-optimization program which includes automated exposure control, adjustment of the mA and/or kV according to patient size and/or use of iterative reconstruction technique. COMPARISON:  None Available. FINDINGS: CT HEAD FINDINGS Brain: There is no acute intracranial hemorrhage, extra-axial  fluid collection, or acute infarct Parenchymal volume is within expected limits for age. The ventricles are normal in size. Gray-white differentiation is preserved The pituitary and suprasellar region are normal. There is no mass lesion there is no mass effect or midline shift. Vascular: There is calcification of the bilateral carotid siphons. Skull: Normal. Negative for fracture or focal lesion. Sinuses/Orbits: The paranasal sinuses are clear. Bilateral lens implants are in place. The globes and orbits are otherwise unremarkable. Other: The mastoid air cells and middle ear cavities are clear. CT CERVICAL SPINE FINDINGS Alignment: There is no significant antero or retrolisthesis. There is no jumped or perched facet or other evidence of traumatic malalignment. Skull base and vertebrae: Skull base alignment is maintained. Vertebral body heights are preserved there is no evidence of acute fracture. There is no suspicious osseous lesion. Soft tissues and spinal canal: No prevertebral fluid or swelling. No visible canal hematoma. Disc levels: There is bulky degenerative pannus about the dens with mild narrowing of the craniocervical junction. There is moderate disc space narrowing and degenerative endplate change at C5-C6 and bulky multilevel facet arthropathy. There is no evidence of high-grade spinal canal stenosis. Upper chest: The imaged lung apices are clear. Other: None. IMPRESSION: 1. No acute intracranial  pathology. 2. No acute fracture or traumatic malalignment of the cervical spine. Electronically Signed   By: Lesia Hausen M.D.   On: 02/10/2023 15:33   DG Chest 1 View  Result Date: 02/10/2023 CLINICAL DATA:  Preop.  Hip fracture EXAM: CHEST  1 VIEW portable supine COMPARISON:  02/06/2017 FINDINGS: Film is rotated to the left. Calcified and tortuous aorta. Prominent calcifications along the tracheobronchial tree. Stable cardiopericardial silhouette. Interstitial lung changes. Apical pleural thickening. No  consolidation, pneumothorax or effusion. Overlapping cardiac leads. Degenerative changes of the spine with osteopenia IMPRESSION: Rotated radiograph. Chronic changes. No acute cardiopulmonary disease Electronically Signed   By: Karen Kays M.D.   On: 02/10/2023 15:04   DG Hip Unilat With Pelvis 2-3 Views Left  Result Date: 02/10/2023 CLINICAL DATA:  Pain after fall EXAM: DG HIP (WITH OR WITHOUT PELVIS) 3V LEFT COMPARISON:  None Available. FINDINGS: Comminuted intertrochanteric hip fracture with foreshortening and displaced fragments. Severe osteopenia. Mild joint space loss of the hips and sacroiliac joints. Hyperostosis. Moderate degenerative changes of the spine at the edge of the imaging field. Diffuse colonic stool. IMPRESSION: Comminuted foreshortened, displaced and angulated intertrochanteric left hip fracture. Osteopenia with degenerative changes. Electronically Signed   By: Karen Kays M.D.   On: 02/10/2023 15:03        Scheduled Meds:  allopurinol  150 mg Oral Daily   DULoxetine  40 mg Oral Daily   feeding supplement (GLUCERNA SHAKE)  237 mL Oral TID BM   gabapentin  200 mg Oral TID   insulin aspart  0-9 Units Subcutaneous TID WC   ketoconazole  1 Application Topical Daily   levothyroxine  75 mcg Oral Q0600   metoprolol succinate  25 mg Oral Daily   multivitamin with minerals  1 tablet Oral Daily   Continuous Infusions:  lactated ringers 10 mL/hr at 02/12/23 0339   sodium chloride       LOS: 2 days       Charise Killian, MD [facility] Pager [phone]  If 7PM-7AM, please contact night-coverage [email or web address] 02/12/2023, 8:53 AM

## 2023-02-13 ENCOUNTER — Inpatient Hospital Stay: Payer: MEDICARE

## 2023-02-13 ENCOUNTER — Inpatient Hospital Stay (HOSPITAL_COMMUNITY): Admit: 2023-02-13 | Discharge: 2023-02-13 | Disposition: A | Discharge status: Home / Self Care | Payer: MEDICARE

## 2023-02-13 DIAGNOSIS — Z01810 Encounter for preprocedural cardiovascular examination: Secondary | ICD-10-CM | POA: Diagnosis not present

## 2023-02-13 DIAGNOSIS — S72002A Fracture of unspecified part of neck of left femur, initial encounter for closed fracture: Secondary | ICD-10-CM | POA: Diagnosis not present

## 2023-02-13 LAB — BASIC METABOLIC PANEL
Anion gap: 8 (ref 5–15)
BUN: 41 mg/dL — ABNORMAL HIGH (ref 8–23)
CO2: 23 mmol/L (ref 22–32)
Calcium: 8.4 mg/dL — ABNORMAL LOW (ref 8.9–10.3)
Chloride: 106 mmol/L (ref 98–111)
Creatinine, Ser: 0.93 mg/dL (ref 0.44–1.00)
GFR, Estimated: 57 mL/min — ABNORMAL LOW (ref >60)
Glucose, Bld: 168 mg/dL — ABNORMAL HIGH (ref 70–99)
Potassium: 4.4 mmol/L (ref 3.5–5.1)
Sodium: 137 mmol/L (ref 135–145)

## 2023-02-13 LAB — CBC WITH DIFFERENTIAL/PLATELET
Abs Immature Granulocytes: 0.08 10*3/uL — ABNORMAL HIGH (ref 0.00–0.07)
Basophils Absolute: 0 10*3/uL (ref 0.0–0.1)
Basophils Relative: 0 %
Eosinophils Absolute: 0.1 10*3/uL (ref 0.0–0.5)
Eosinophils Relative: 1 %
HCT: 24.9 % — ABNORMAL LOW (ref 36.0–46.0)
Hemoglobin: 8.6 g/dL — ABNORMAL LOW (ref 12.0–15.0)
Immature Granulocytes: 1 %
Lymphocytes Relative: 18 %
Lymphs Abs: 1.6 10*3/uL (ref 0.7–4.0)
MCH: 32 pg (ref 26.0–34.0)
MCHC: 34.5 g/dL (ref 30.0–36.0)
MCV: 92.6 fL (ref 80.0–100.0)
Monocytes Absolute: 0.8 10*3/uL (ref 0.1–1.0)
Monocytes Relative: 8 %
Neutro Abs: 6.6 10*3/uL (ref 1.7–7.7)
Neutrophils Relative %: 72 %
Platelets: 137 10*3/uL — ABNORMAL LOW (ref 150–400)
RBC: 2.69 MIL/uL — ABNORMAL LOW (ref 3.87–5.11)
RDW: 15.9 % — ABNORMAL HIGH (ref 11.5–15.5)
WBC: 9.2 10*3/uL (ref 4.0–10.5)
nRBC: 0.9 % — ABNORMAL HIGH (ref 0.0–0.2)

## 2023-02-13 LAB — ECHOCARDIOGRAM COMPLETE
AR max vel: 2.18 cm2
AV Area VTI: 2.36 cm2
AV Area mean vel: 2.34 cm2
AV Mean grad: 3 mmHg
AV Peak grad: 5.7 mmHg
Ao pk vel: 1.19 m/s
Area-P 1/2: 7.29 cm2
Height: 61.5 in
S' Lateral: 3.2 cm
Weight: 2960 oz

## 2023-02-13 LAB — BPAM RBC
Blood Product Expiration Date: 202410232359
Blood Product Expiration Date: 202410232359
ISSUE DATE / TIME: 202409221535
Unit Type and Rh: 6200
Unit Type and Rh: 6200
Unit Type and Rh: 6200

## 2023-02-13 LAB — GLUCOSE, CAPILLARY
Glucose-Capillary: 153 mg/dL — ABNORMAL HIGH (ref 70–99)
Glucose-Capillary: 172 mg/dL — ABNORMAL HIGH (ref 70–99)
Glucose-Capillary: 163 mg/dL — ABNORMAL HIGH (ref 70–99)
Glucose-Capillary: 163 mg/dL — ABNORMAL HIGH (ref 70–99)
Glucose-Capillary: 145 mg/dL — ABNORMAL HIGH (ref 70–99)

## 2023-02-13 LAB — TYPE AND SCREEN
ABO/RH(D): A POS
Antibody Screen: NEGATIVE
Unit division: 0
Unit division: 0

## 2023-02-13 MED ORDER — OXYCODONE-ACETAMINOPHEN 5-325 MG PO TABS
1.0000 | ORAL_TABLET | Freq: Four times a day (QID) | ORAL | Status: DC | PRN
  Administered 2023-02-13 – 2023-02-15 (×5): 1 via ORAL
  Filled 2023-02-13 (×6): qty 1

## 2023-02-13 NOTE — Plan of Care (Signed)
  Problem: Fluid Volume: Goal: Ability to maintain a balanced intake and output will improve Outcome: Progressing   Problem: Nutritional: Goal: Maintenance of adequate nutrition will improve Outcome: Progressing   Problem: Nutrition: Goal: Adequate nutrition will be maintained Outcome: Progressing   Problem: Coping: Goal: Level of anxiety will decrease Outcome: Progressing   Problem: Pain Managment: Goal: General experience of comfort will improve Outcome: Progressing

## 2023-02-13 NOTE — Progress Notes (Signed)
Physical Therapy Treatment Patient Details Name: Cheryl Rios MRN: [id number] DOB: [birth year] Today's Date: 02/13/2023   History of Present Illness Pt is a 89 y/o female admitted after a mechanical fall in which she sustained a L hip fx s/p cephalomedullary nail.    PT Comments  Pt resting in recliner upon PT arrival; agreeable to therapy.  L hip pain 6/10 at rest beginning of session and 7/10 at rest end of session (nurse notified and reports recent pain meds).  During session pt 2 assist with transfers and walking a couple feet with youth sized RW use.  Limited activity d/t pt fatigue.  Will continue to focus on strengthening and progressive functional mobility per pt tolerance.   If plan is discharge home, recommend the following: Two people to help with walking and/or transfers;A lot of help with bathing/dressing/bathroom;Assistance with cooking/housework;Supervision due to cognitive status;Help with stairs or ramp for entrance;Assist for transportation   Can travel by private vehicle     No  Equipment Recommendations  Other (comment) (TBD at next facility)    Recommendations for Other Services       Precautions / Restrictions Precautions Precautions: Fall Restrictions Weight Bearing Restrictions: Yes LLE Weight Bearing: Weight bearing as tolerated     Mobility  Bed Mobility               General bed mobility comments: Deferred (pt in recliner beginning/end of session)    Transfers Overall transfer level: Needs assistance Equipment used: Rolling walker (2 wheels) (youth sized) Transfers: Sit to/from Stand, Bed to chair/wheelchair/BSC Sit to Stand: Mod assist, +2 physical assistance Stand pivot transfers: Mod assist, +2 physical assistance         General transfer comment: x2 trials standing from recliner and x1 trial standing from [facility] (vc's for UE/LE placement and overall technique); stand pivot transfer to R bed to [facility]    Ambulation/Gait Ambulation/Gait  assistance: Min assist, Mod assist, +2 physical assistance Gait Distance (Feet): 2 Feet (BSC to recliner) Assistive device: Rolling walker (2 wheels) (youth sized)   Gait velocity: decreased     General Gait Details: antalgic; decreased stance time L LE; very small step length and foot clearance; assist to steady   Stairs             Wheelchair Mobility     Tilt Bed    Modified Rankin (Stroke Patients Only)       Balance Overall balance assessment: Needs assistance Sitting-balance support: No upper extremity supported, Feet supported Sitting balance-Leahy Scale: Good Sitting balance - Comments: steady reaching within BOS   Standing balance support: Reliant on assistive device for balance, During functional activity, Bilateral upper extremity supported Standing balance-Leahy Scale: Poor Standing balance comment: assist to steady in standing with B UE support on RW                            Cognition Arousal: Alert Behavior During Therapy: WFL for tasks assessed/performed Overall Cognitive Status: Impaired/Different from baseline Area of Impairment: Safety/judgement, Problem solving                         Safety/Judgement: Decreased awareness of safety, Decreased awareness of deficits   Problem Solving: Difficulty sequencing, Requires verbal cues, Requires tactile cues          Exercises      General Comments  Nursing cleared pt for participation in physical therapy.  Pt agreeable to PT session.  Pt requesting to toilet during session (pt assisted to/from [facility]).  Pt's brother and sister in law present part of session; nephew present end of session.      Pertinent Vitals/Pain Pain Assessment Pain Assessment: 0-10 Pain Score: 7  Pain Location: L hip Pain Descriptors / Indicators: Guarding, Grimacing, Aching, Tender Pain Intervention(s): Limited activity within patient's tolerance, Monitored during session, Premedicated before  session, Repositioned, Other (comment) (RN notified of pt's pain) Vitals (HR and SpO2 on room air) stable and WFL throughout treatment session.    Home Living                          Prior Function            PT Goals (current goals can now be found in the care plan section) Acute Rehab PT Goals Patient Stated Goal: decrease pain PT Goal Formulation: With patient Time For Goal Achievement: 02/26/23 Potential to Achieve Goals: Good Progress towards PT goals: Progressing toward goals    Frequency    Min 1X/week      PT Plan      Co-evaluation              AM-PAC PT "6 Clicks" Mobility   Outcome Measure  Help needed turning from your back to your side while in a flat bed without using bedrails?: A Lot Help needed moving from lying on your back to sitting on the side of a flat bed without using bedrails?: A Lot Help needed moving to and from a bed to a chair (including a wheelchair)?: Total Help needed standing up from a chair using your arms (e.g., wheelchair or bedside chair)?: Total Help needed to walk in hospital room?: Total Help needed climbing 3-5 steps with a railing? : Total 6 Click Score: 8    End of Session Equipment Utilized During Treatment: Gait belt Activity Tolerance: Patient limited by pain Patient left: in chair;with call bell/phone within reach;with chair alarm set;with family/visitor present;with SCD's reapplied Nurse Communication: Mobility status;Precautions;Other (comment);Weight bearing status (Pt's pain status) PT Visit Diagnosis: Other abnormalities of gait and mobility (R26.89)     Time: 1610-9604 PT Time Calculation (min) (ACUTE ONLY): 35 min  Charges:    $Gait Training: 8-22 mins $Therapeutic Activity: 8-22 mins PT General Charges $$ ACUTE PT VISIT: 1 Visit                     Hendricks Limes, PT 02/13/23, 1:02 PM

## 2023-02-13 NOTE — [place] FL2 (Signed)
[place] MEDICAID FL2 LEVEL OF CARE FORM     IDENTIFICATION  Patient Name: Cheryl Rios Birthdate: 04/27/1926 Sex: female Admission Date (Current Location): 02/10/2023  [facility] and [place] Number:  Chiropodist and Address:  [facility], [address] [place], [place] [address]      Provider Number: [id number]  Attending Physician Name and Address:  Charise Killian, MD  Relative Name and Phone Number:  Gabriel Earing [phone]    Current Level of Care: Hospital Recommended Level of Care: Skilled Nursing Facility Prior Approval Number:    Date Approved/Denied:   PASRR Number: [id number]  Discharge Plan: SNF    Current Diagnoses: Patient Active Problem List   Diagnosis Date Noted   Closed left hip fracture, initial encounter (HCC) 02/10/2023   Hip fracture (HCC) 02/10/2023   Chronic kidney disease, unspecified 01/03/2023   Non-pressure chronic ulcer of unspecified part of left lower leg limited to breakdown of skin (HCC) 09/27/2022   Chronic gouty arthritis 10/28/2021   OSA on CPAP 02/01/2021   CPAP use counseling 02/01/2021   Hypertension 02/01/2021   Obesity (BMI 30-39.9) 02/01/2021   Callus 07/13/2020   Pedal edema 11/08/2017   Choledocholithiasis    Sepsis (HCC) 02/05/2017   Fever 02/04/2017   Weakness 02/04/2017   Failure to thrive in adult 02/04/2017   OA (osteoarthritis) 02/04/2017   Status post total left knee replacement 09/19/2016   Hyperlipidemia, unspecified 08/09/2014   Type 2 diabetes mellitus without complications (HCC) 08/09/2014   Osteoporosis 10/04/2013   Elevated LFTs 01/23/2011    Orientation RESPIRATION BLADDER Height & Weight     Self, Time, Situation, Place  Normal Incontinent Weight: 83.9 kg Height:  5' 1.5" (156.2 cm)  BEHAVIORAL SYMPTOMS/MOOD NEUROLOGICAL BOWEL NUTRITION STATUS      Continent Diet (See DC summary)  AMBULATORY STATUS COMMUNICATION OF NEEDS Skin   Extensive Assist  Verbally Normal, Surgical wounds                       Personal Care Assistance Level of Assistance  Bathing Bathing Assistance: Limited assistance         Functional Limitations Info  Sight, Hearing, Speech Sight Info: Adequate Hearing Info: Adequate Speech Info: Adequate    SPECIAL CARE FACTORS FREQUENCY  PT (By licensed PT), OT (By licensed OT)     PT Frequency: 5 times per week OT Frequency: 5 times per week            Contractures Contractures Info: Not present    Additional Factors Info  Code Status, Allergies Code Status Info: DNR Allergies Info: NKDA           Current Medications (02/13/2023):  This is the current hospital active medication list Current Facility-Administered Medications  Medication Dose Route Frequency Provider Last Rate Last Admin   0.9 %  sodium chloride infusion (Manually program via Guardrails IV Fluids)   Intravenous Once Charise Killian, MD       acetaminophen (TYLENOL) tablet 1,000 mg  1,000 mg Oral Q6H PRN Manuela Schwartz, NP   1,000 mg at 02/13/23 0644   allopurinol (ZYLOPRIM) tablet 150 mg  150 mg Oral Daily Campbell Stall, MD   150 mg at 02/13/23 1025   DULoxetine (CYMBALTA) DR capsule 40 mg  40 mg Oral Daily Campbell Stall, MD   40 mg at 02/13/23 1025   feeding supplement (GLUCERNA SHAKE) (GLUCERNA SHAKE) liquid 237 mL  237 mL Oral  TID BM Zafonte, Adelfa Koh, MD   237 mL at 02/13/23 1026   fluticasone (FLONASE) 50 MCG/ACT nasal spray 1 spray  1 spray Each Nare Daily PRN Zafonte, Adelfa Koh, MD       gabapentin (NEURONTIN) capsule 200 mg  200 mg Oral TID Campbell Stall, MD   200 mg at 02/13/23 1026   hydrALAZINE (APRESOLINE) tablet 10 mg  10 mg Oral Q6H PRN Campbell Stall, MD       HYDROmorphone (DILAUDID) injection 0.5 mg  0.5 mg Intravenous Q2H PRN Zafonte, Adelfa Koh, MD       insulin aspart (novoLOG) injection 0-9 Units  0-9 Units Subcutaneous TID WC Zafonte, Adelfa Koh, MD   2 Units  at 02/13/23 1310   ketoconazole (NIZORAL) 2 % cream 1 Application  1 Application Topical Daily Zafonte, Adelfa Koh, MD   1 Application at 02/13/23 1026   lactated ringers infusion   Intravenous Continuous Marisue Humble, MD   Stopped at 02/12/23 1540   levothyroxine (SYNTHROID) tablet 75 mcg  75 mcg Oral Q0600 Campbell Stall, MD   75 mcg at 02/13/23 0530   metoprolol succinate (TOPROL-XL) 24 hr tablet 25 mg  25 mg Oral Daily Campbell Stall, MD   25 mg at 02/13/23 1026   multivitamin with minerals tablet 1 tablet  1 tablet Oral Daily Zafonte, Adelfa Koh, MD   1 tablet at 02/13/23 1025   ondansetron (ZOFRAN) injection 4 mg  4 mg Intravenous Q6H PRN Zafonte, Adelfa Koh, MD       oxyCODONE-acetaminophen (PERCOCET/ROXICET) 5-325 MG per tablet 1 tablet  1 tablet Oral Q6H PRN Charise Killian, MD   1 tablet at 02/13/23 1024   senna-docusate (Senokot-S) tablet 1 tablet  1 tablet Oral QHS PRN Zafonte, Adelfa Koh, MD         Discharge Medications: Please see discharge summary for a list of discharge medications.  Relevant Imaging Results:  Relevant Lab Results:   Additional Information SS# [id number]  Marlowe Sax, RN

## 2023-02-13 NOTE — Plan of Care (Signed)
  Problem: Education: Goal: Ability to describe self-care measures that may prevent or decrease complications (Diabetes Survival Skills Education) will improve Outcome: Progressing   Problem: Pain Managment: Goal: General experience of comfort will improve Outcome: Progressing   Problem: Elimination: Goal: Will not experience complications related to bowel motility Outcome: Progressing   Problem: Skin Integrity: Goal: Risk for impaired skin integrity will decrease Outcome: Progressing   Problem: Safety: Goal: Ability to remain free from injury will improve Outcome: Progressing

## 2023-02-13 NOTE — Progress Notes (Signed)
Nutrition Follow-up  DOCUMENTATION CODES:   Obesity unspecified  INTERVENTION:   -Liberalize diet to carb modified for wider variety of meal selections -Continue Glucerna Shake po TID, each supplement provides 220 kcal and 10 grams of protein  -Continue MVI with minerals daily  NUTRITION DIAGNOSIS:   Increased nutrient needs related to post-op healing as evidenced by estimated needs.  Ongoing  GOAL:   Patient will meet greater than or equal to 90% of their needs  Progressing   MONITOR:   PO intake, Supplement acceptance, Diet advancement  REASON FOR ASSESSMENT:   Consult Assessment of nutrition requirement/status, Hip fracture protocol  ASSESSMENT:   Pt with medical history significant of HTN, HLD, rheumatoid arthritis, IIDM on diet control, peripheral neuropathy, OSA on CPAP at bedtime, hypothyroidism, osteoporosis, morbid obesity, admitted for fall and left hip pain.  9/20- s/p Surgical stabilization of left hip fracture with cephalomedullary nail   Reviewed I/O's: -104 ml x 24 hours and +736 ml since admission  UOP: 1 L x 24 hours  Pt sitting up in recliner chair at time of visit, with family members at bedside. Pt reports that she is feeling better a time of visit. Per pt, she consumed most of her breakfast this morning (eggs, sausage patty, fruit, and grits). Noted meal completions 20-100%.   PTA, pt has a good appetite. She consumes 3 meals per day (Breakfast: eggs and bacon; Lunch: mea,t starch, and vegetable, Dinner: cottage cheese and fruit). Pt also has family members who provide her with meals.   Pt denies any weight loss. Reviewed wt hx; pt has experienced a 1.6% wt loss over the past year, which is not significant for time frame.   Discussed importance of good meal and supplement intake to promote healing. Pt amenable to continue Glucerna supplements.   Pt expressed concern over hyperglycemia in the hospital. Discussed stress response to acute illness  and how this impact blood sugar levels. Reviewed how DM is treated in the hospital in comparison to outpatient.   Medications reviewed and include neurontin and MVI with minerals daily.   Labs reviewed: CBGS: 140-163 (inpatient orders for glycemic control are 0-9 units insulin aspart TID with meals).    NUTRITION - FOCUSED PHYSICAL EXAM:  Flowsheet Row Most Recent Value  Orbital Region No depletion  Upper Arm Region No depletion  Thoracic and Lumbar Region No depletion  Buccal Region No depletion  Temple Region No depletion  Clavicle Bone Region No depletion  Clavicle and Acromion Bone Region No depletion  Scapular Bone Region No depletion  Dorsal Hand No depletion  Patellar Region No depletion  Anterior Thigh Region No depletion  Posterior Calf Region No depletion  Edema (RD Assessment) Mild  Hair Reviewed  Eyes Reviewed  Mouth Reviewed  Skin Reviewed  Nails Reviewed       Diet Order:   Diet Order             Diet heart healthy/carb modified Room service appropriate? Yes; Fluid consistency: Thin  Diet effective now                   EDUCATION NEEDS:   No education needs have been identified at this time  Skin:  Skin Assessment: Reviewed RN Assessment  Last BM:  02/09/23  Height:   Ht Readings from Last 1 Encounters:  02/10/23 5' 1.5" (1.562 m)    Weight:   Wt Readings from Last 1 Encounters:  02/10/23 83.9 kg    Ideal Body Weight:  48.9 kg  BMI:  Body mass index is 34.39 kg/m.  Estimated Nutritional Needs:   Kcal:  1450-1650  Protein:  70-85 grams  Fluid:  > 1.5 L    Levada Schilling, RD, LDN, CDCES Registered Dietitian II Certified Diabetes Care and Education Specialist Please refer to [facility] for RD and/or RD on-call/weekend/after hours pager

## 2023-02-13 NOTE — Progress Notes (Signed)
Occupational Therapy Treatment Patient Details Name: Cheryl Rios MRN: [id number] DOB: [birth year] Today's Date: 02/13/2023   History of present illness Pt is a 89 y/o female admitted after a mechanical fall in which she sustained a L hip fx s/p cephalomedullary nail.   OT comments  Pt seen for OT tx this date (+2 required for pt/therapist safety). Pt reports hip pain 7/10 beginning and end of session (RN notified). Per chart review, pt's RUE swelling is from IV site (sent secure chat to MD as well to notify as family was concerned with worsening presentation). Bed mobility completed with MOD +2 using pad and management of BLE. Occasional MIN A - CGA sitting EOB during grooming task. Youth RW used for standing at bedside. Pt attempts to complete transfer to [facility] with RW and +2, but unable secondary to fatigue and pain. Limited activity tolerated, pt demonstrates difficulties managing RW and WB precautions. Small incontinent void in standing. Pt requires total A for pericare, MOD A to roll on/off bedpan to finish void. MOD - MAX x 2 to return from EOB > supine. Pt left with SCDs reapplied, needs within reach, bed alarm activated and RN notified of pt's status/pain levels. Pt is progressing toward OT goals and continues to benefit from skilled OT services to maximize return to PLOF and minimize risk of future falls, injury, caregiver burden, and readmission. Will continue to follow POC as written. Discharge recommendation remains appropriate.        If plan is discharge home, recommend the following:  Two people to help with walking and/or transfers;Two people to help with bathing/dressing/bathroom;Assistance with cooking/housework;Assist for transportation;Help with stairs or ramp for entrance   Equipment Recommendations  Other (comment)    Recommendations for Other Services Other (comment)    Precautions / Restrictions Precautions Precautions: Fall Restrictions Weight Bearing Restrictions:  Yes LLE Weight Bearing: Weight bearing as tolerated       Mobility Bed Mobility Overal bed mobility: Needs Assistance Bed Mobility: Supine to Sit, Sit to Supine     Supine to sit: Max assist, +2 for physical assistance, HOB elevated, Used rails Sit to supine: Max assist, +2 for physical assistance        Transfers Overall transfer level: Needs assistance Equipment used: Rolling walker (2 wheels) Transfers: Sit to/from Stand, Bed to chair/wheelchair/BSC Sit to Stand: Max assist, +2 physical assistance, +2 safety/equipment, From elevated surface Stand pivot transfers: Mod assist, +2 safety/equipment, +2 physical assistance               Balance Overall balance assessment: Needs assistance Sitting-balance support: No upper extremity supported, Feet supported Sitting balance-Leahy Scale: Good     Standing balance support: Reliant on assistive device for balance, During functional activity, Bilateral upper extremity supported Standing balance-Leahy Scale: Poor                             ADL either performed or assessed with clinical judgement   ADL Overall ADL's : Needs assistance/impaired     Grooming: Wash/dry hands;Wash/dry face;Sitting;Set up;Supervision/safety                   Toilet Transfer: +2 for safety/equipment;+2 for physical assistance;Adhering to hip precautions;Cueing for safety;Cueing for sequencing;Stand-pivot;BSC/3in1 Toilet Transfer Details (indicate cue type and reason): Attempted to transfer pt from sitting EOB to [facility], pt stands and difficulties with sequencing RW and WB precautions. Incontinent void in standing (pt unaware). Pt returned to bed,  placed on bedpan to finish Toileting- Clothing Manipulation and Hygiene: Total assistance       Functional mobility during ADLs: +2 for safety/equipment;+2 for physical assistance;Rolling walker (2 wheels);Cueing for sequencing;Cueing for safety General ADL Comments: Pt unable to take  steps for [facility] transfer, returned to bed on bedpan    Extremity/Trunk Assessment Upper Extremity Assessment Upper Extremity Assessment: RUE deficits/detail (Swelling and bruising (RN notified, reported it was from IV site))             Cognition Arousal: Alert Behavior During Therapy: WFL for tasks assessed/performed Overall Cognitive Status: Impaired/Different from baseline Area of Impairment: Safety/judgement, Problem solving                         Safety/Judgement: Decreased awareness of safety, Decreased awareness of deficits   Problem Solving: Difficulty sequencing, Requires verbal cues, Requires tactile cues                General Comments RUE hand with swelling and bruising (from IV site per chart review)    Pertinent Vitals/ Pain       Pain Assessment Pain Assessment: 0-10 Pain Score: 7  Pain Location: L hip Pain Descriptors / Indicators: Guarding, Grimacing, Aching, Tender Pain Intervention(s): Limited activity within patient's tolerance, Repositioned   Frequency  Min 1X/week        Progress Toward Goals  OT Goals(current goals can now be found in the care plan section)  Progress towards OT goals: Progressing toward goals  Acute Rehab OT Goals OT Goal Formulation: With patient Time For Goal Achievement: 02/26/23 Potential to Achieve Goals: Fair  Plan         AM-PAC OT "6 Clicks" Daily Activity     Outcome Measure   Help from another person eating meals?: None Help from another person taking care of personal grooming?: A Little Help from another person toileting, which includes using toliet, bedpan, or urinal?: Total Help from another person bathing (including washing, rinsing, drying)?: A Lot Help from another person to put on and taking off regular upper body clothing?: A Lot Help from another person to put on and taking off regular lower body clothing?: Total 6 Click Score: 13    End of Session Equipment Utilized During  Treatment: Gait belt;Rolling walker (2 wheels)  OT Visit Diagnosis: Unsteadiness on feet (R26.81);Repeated falls (R29.6);Muscle weakness (generalized) (M62.81)   Activity Tolerance Patient limited by fatigue   Patient Left in bed;with call bell/phone within reach;with bed alarm set   Nurse Communication Mobility status        Time: 1545-1620 OT Time Calculation (min): 35 min  Charges: OT General Charges $OT Visit: 1 Visit OT Treatments $Self Care/Home Management : 23-37 mins  Jamilya Sarrazin L. Gelena Klosinski, OTR/L  02/13/23, 4:37 PM

## 2023-02-13 NOTE — Care Management Important Message (Signed)
Important Message  Patient Details  Name: Cheryl Rios MRN: [id number] Date of Birth: 1925/10/04   Medicare Important Message Given:  N/A - LOS <3 / Initial given by admissions     Olegario Messier A Reyhan Moronta 02/13/2023, 10:27 AM

## 2023-02-13 NOTE — Progress Notes (Signed)
Physical Therapy Treatment Patient Details Name: Cheryl Rios MRN: [id number] DOB: 1925-09-02 Today's Date: 02/13/2023   History of Present Illness Pt is a 89 y/o female admitted after a mechanical fall in which she sustained a L hip fx s/p cephalomedullary nail.    PT Comments  Pt resting in recliner upon PT arrival; pt requesting back to bed but needing to toilet first.  During session pt 2 assist with transfers; 2 assist ambulating 3 feet with youth sized RW use; and 2 assist to lay back down in bed.  Pt requiring assist to advance L LE during ambulation attempt and also required vc's for technique.  Limited distance/activity d/t fatigue and L hip pain (nurse notified of pt's 7/10 L hip pain end of session).  Will continue to focus on strengthening and progressive functional mobility during hospitalization.   If plan is discharge home, recommend the following: Two people to help with walking and/or transfers;A lot of help with bathing/dressing/bathroom;Assistance with cooking/housework;Supervision due to cognitive status;Help with stairs or ramp for entrance;Assist for transportation   Can travel by private vehicle     No  Equipment Recommendations  Other (comment) (TBD at next facility)    Recommendations for Other Services       Precautions / Restrictions Precautions Precautions: Fall Restrictions Weight Bearing Restrictions: Yes LLE Weight Bearing: Weight bearing as tolerated     Mobility  Bed Mobility Overal bed mobility: Needs Assistance Bed Mobility: Sit to Supine       Sit to supine: Mod assist, +2 for physical assistance, HOB elevated   General bed mobility comments: assist for trunk and B LE's    Transfers Overall transfer level: Needs assistance Equipment used: Rolling walker (2 wheels) Transfers: Sit to/from Stand Sit to Stand: Mod assist, +2 physical assistance Stand pivot transfers: Mod assist, +2 physical assistance         General transfer  comment: x1 trial standing from recliner and x1 trial standing from [facility]; vc's for UE/LE placement and overall technique    Ambulation/Gait Ambulation/Gait assistance: Min assist, Mod assist, +2 physical assistance Gait Distance (Feet): 3 Feet Assistive device: Rolling walker (2 wheels) (youth sized)   Gait velocity: decreased     General Gait Details: antalgic; decreased stance time L LE; very small step length and foot clearance; assist to steady   Stairs             Wheelchair Mobility     Tilt Bed    Modified Rankin (Stroke Patients Only)       Balance Overall balance assessment: Needs assistance Sitting-balance support: No upper extremity supported, Feet supported Sitting balance-Leahy Scale: Good Sitting balance - Comments: steady reaching within BOS   Standing balance support: Reliant on assistive device for balance, During functional activity, Bilateral upper extremity supported Standing balance-Leahy Scale: Poor Standing balance comment: assist to steady in standing with B UE support on RW                            Cognition Arousal: Alert Behavior During Therapy: WFL for tasks assessed/performed Overall Cognitive Status: Impaired/Different from baseline Area of Impairment: Safety/judgement, Problem solving                         Safety/Judgement: Decreased awareness of safety, Decreased awareness of deficits   Problem Solving: Difficulty sequencing, Requires verbal cues, Requires tactile cues  Exercises      General Comments General comments (skin integrity, edema, etc.): RUE hand with swelling and bruising (from IV site per chart review)      Pertinent Vitals/Pain Pain Assessment Pain Assessment: 0-10 Pain Score: 7  Pain Location: L hip Pain Descriptors / Indicators: Guarding, Grimacing, Aching, Tender Pain Intervention(s): Limited activity within patient's tolerance, Monitored during session, Premedicated  before session, Repositioned, Other (comment) (RN notified regarding pt's pain status) Vitals (HR and SpO2 on room air) stable and WFL throughout treatment session.    Home Living                          Prior Function            PT Goals (current goals can now be found in the care plan section) Acute Rehab PT Goals Patient Stated Goal: decrease pain PT Goal Formulation: With patient Time For Goal Achievement: 02/26/23 Potential to Achieve Goals: Good Progress towards PT goals: Progressing toward goals    Frequency    Min 1X/week      PT Plan      Co-evaluation              AM-PAC PT "6 Clicks" Mobility   Outcome Measure  Help needed turning from your back to your side while in a flat bed without using bedrails?: A Lot Help needed moving from lying on your back to sitting on the side of a flat bed without using bedrails?: A Lot Help needed moving to and from a bed to a chair (including a wheelchair)?: Total Help needed standing up from a chair using your arms (e.g., wheelchair or bedside chair)?: Total Help needed to walk in hospital room?: Total Help needed climbing 3-5 steps with a railing? : Total 6 Click Score: 8    End of Session Equipment Utilized During Treatment: Gait belt Activity Tolerance: Patient limited by pain Patient left: in bed;with call bell/phone within reach;with bed alarm set;Other (comment) (B LE's elevated via pillow support with heels floating) Nurse Communication: Mobility status;Precautions;Other (comment);Weight bearing status (Pt's pain status) PT Visit Diagnosis: Other abnormalities of gait and mobility (R26.89)     Time: 1331-1401 PT Time Calculation (min) (ACUTE ONLY): 30 min  Charges:    $Therapeutic Activity: 23-37 mins PT General Charges $$ ACUTE PT VISIT: 1 Visit                     Hendricks Limes, PT 02/13/23, 6:15 PM

## 2023-02-13 NOTE — Progress Notes (Signed)
*  PRELIMINARY RESULTS* Echocardiogram 2D Echocardiogram has been performed.  Cheryl Rios 02/13/2023, 8:50 AM

## 2023-02-13 NOTE — Progress Notes (Signed)
PROGRESS NOTE    Cheryl Rios  ZOX:[id number] DOB: 02/20/1926 DOA: 02/10/2023 PCP: Jerl Mina, MD   Assessment & Plan:   Principal Problem:   Hip fracture [facility]) Active Problems:   OSA on CPAP   Hypertension   Hyperlipidemia, unspecified   Closed left hip fracture, initial encounter (HCC)  Assessment and Plan: Left hip fracture: secondary to mechanical fall. S/p surg stabilization of left hip w/ cephalomedullary nail. Percocet, dilaudid prn for pain. Ortho surg following and recs apprec  Acute blood loss anemia: secondary to above recent surg. S/p 2 units of pRBCs transfused so far. Goal to keep Hb >8 as per ortho surg    Fall: continue w/ fall precautions. CT head and neck negative for intracranial bleeding, fracture or dislocation   Osteopenia: continue on vitamin D supplement   HTN: hold metoprolol for MAP < 65 and/or HR < 65. Holding lisinopril, lasix   DM2: likely poorly controlled. Continue on SSI w/ accuchecks    Hypothyroidism: continue on levothyroxine    RA: continue on home dose of hydroxychloroquine, qweekly methotrexate   OSA: CPAP qhs  Obesity: BMI 34. Would benefit from weight loss      DVT prophylaxis: SCDs Code Status: full  Family Communication: discussed pt's care w/ pt's family at bedside and answered their questions Disposition Plan: likely d/c to SNF   Level of care: Med-Surg Status is: Inpatient Remains inpatient appropriate because: severity of illness    Consultants:  Ortho surg   Procedures:   Antimicrobials:  Subjective: Pt c/o hip pain   Objective: Vitals:   02/12/23 1835 02/12/23 2101 02/13/23 0013 02/13/23 0754  BP: (!) 102/49 (!) 107/50 (!) 111/53 (!) 124/59  Pulse: 100 99 99 (!) 103  Resp: 20 17 20 16   Temp: 99.9 F (37.7 C)  98.6 F (37 C) (!) 97.5 F (36.4 C)  TempSrc: Oral     SpO2: 91%  91% 94%  Weight:      Height:        Intake/Output Summary (Last 24 hours) at 02/13/2023 0814 Last data filed at  02/13/2023 0618 Gross per 24 hour  Intake 896.21 ml  Output 800 ml  Net 96.21 ml   Filed Weights   02/10/23 1324  Weight: 83.9 kg    Examination:  General exam: Appears uncomfortable Respiratory system: diminished breath sounds b/l Cardiovascular system: S1 & S2+. No rubs or clicks  Gastrointestinal system: abd is soft, NT, obese & hypoactive bowel sounds Central nervous system: alert & awake. Moves all extremities  Psychiatry: Judgement and insight appears at baseline. Flat mood and affect    Data Reviewed: I have personally reviewed following labs and imaging studies  CBC: Recent Labs  Lab 02/10/23 1333 02/11/23 1249 02/11/23 2116 02/12/23 0418 02/12/23 1154 02/13/23 0624  WBC 5.7 13.3*  --  11.4*  --  9.2  NEUTROABS 4.1  --   --  9.6*  --  6.6  HGB 12.3 9.3* 7.6* 8.2* 7.8* 8.6*  HCT 36.7 28.8* 23.4* 24.3* 23.7* 24.9*  MCV 96.1 97.0  --  93.1  --  92.6  PLT 190 193  --  134*  --  137*   Basic Metabolic Panel: Recent Labs  Lab 02/10/23 1333 02/11/23 1249 02/12/23 0418  NA 137 138 136  K 4.3 4.3 4.7  CL 101 107 105  CO2 25 24 22   GLUCOSE 184* 212* 200*  BUN 34* 30* 45*  CREATININE 0.98 0.95 1.55*  CALCIUM 9.2 8.5* 8.1*  GFR: Estimated Creatinine Clearance: 21.6 mL/min (A) (by C-G formula based on SCr of 1.55 mg/dL (H)). Liver Function Tests: No results for input(s): "AST", "ALT", "ALKPHOS", "BILITOT", "PROT", "ALBUMIN" in the last 168 hours. No results for input(s): "LIPASE", "AMYLASE" in the last 168 hours. No results for input(s): "AMMONIA" in the last 168 hours. Coagulation Profile: No results for input(s): "INR", "PROTIME" in the last 168 hours. Cardiac Enzymes: No results for input(s): "CKTOTAL", "CKMB", "CKMBINDEX", "TROPONINI" in the last 168 hours. BNP (last 3 results) No results for input(s): "PROBNP" in the last 8760 hours. HbA1C: Recent Labs    02/10/23 1635  HGBA1C 6.2*   CBG: Recent Labs  Lab 02/12/23 0745 02/12/23 1138  02/12/23 1633 02/12/23 2359 02/13/23 0757  GLUCAP 161* 189* 161* 140* 172*   Lipid Profile: No results for input(s): "CHOL", "HDL", "LDLCALC", "TRIG", "CHOLHDL", "LDLDIRECT" in the last 72 hours. Thyroid Function Tests: No results for input(s): "TSH", "T4TOTAL", "FREET4", "T3FREE", "THYROIDAB" in the last 72 hours. Anemia Panel: No results for input(s): "VITAMINB12", "FOLATE", "FERRITIN", "TIBC", "IRON", "RETICCTPCT" in the last 72 hours. Sepsis Labs: No results for input(s): "PROCALCITON", "LATICACIDVEN" in the last 168 hours.  No results found for this or any previous visit (from the past 240 hour(s)).       Radiology Studies: DG HIP UNILAT WITH PELVIS 2-3 VIEWS LEFT  Result Date: 02/11/2023 CLINICAL DATA:  [id number] Surgery, elective [id number] EXAM: DG HIP (WITH OR WITHOUT PELVIS) 2-3V LEFT COMPARISON:  February 10, 2023 FINDINGS: Spot fluoroscopy images were obtained for surgical planning purposes. Spot fluoroscopy images demonstrate placement an intramedullary rod. Persistent displacement of the lesser trochanter. Time: 4 minutes 42 seconds Dose: 83.28 mGy Please reference procedure report for further details. IMPRESSION: Spot fluoroscopy images for surgical planning purposes. Electronically Signed   By: Meda Klinefelter M.D.   On: 02/11/2023 15:14   DG FEMUR PORT MIN 2 VIEWS LEFT  Result Date: 02/11/2023 CLINICAL DATA:  Status post femoral nail EXAM: LEFT FEMUR PORTABLE 2 VIEWS COMPARISON:  Preoperative hip radiographs 1 day prior FINDINGS: There has been interval intramedullary nail fixation of the left intertrochanteric fracture with improved fracture alignment. Hardware alignment is within expected limits, without evidence of complication. Postsurgical changes reflecting left knee arthroplasty are also noted without evidence of complication. Femoroacetabular alignment is maintained. IMPRESSION: Status post intramedullary nail fixation of the intertrochanteric fracture with improved  alignment and no evidence of complication. Electronically Signed   By: Lesia Hausen M.D.   On: 02/11/2023 13:30   DG C-Arm 1-60 Min-No Report  Result Date: 02/11/2023 Fluoroscopy was utilized by the requesting physician.  No radiographic interpretation.   DG C-Arm 1-60 Min-No Report  Result Date: 02/11/2023 Fluoroscopy was utilized by the requesting physician.  No radiographic interpretation.   DG C-Arm 1-60 Min-No Report  Result Date: 02/11/2023 Fluoroscopy was utilized by the requesting physician.  No radiographic interpretation.   [place] OR NERVE BLOCK-IMAGE ONLY [facility])  Result Date: 02/11/2023 There is no interpretation for this exam.  This order is for images obtained during a surgical procedure.  Please See "Surgeries" Tab for more information regarding the procedure.        Scheduled Meds:  sodium chloride   Intravenous Once   allopurinol  150 mg Oral Daily   DULoxetine  40 mg Oral Daily   feeding supplement (GLUCERNA SHAKE)  237 mL Oral TID BM   gabapentin  200 mg Oral TID   insulin aspart  0-9 Units Subcutaneous TID WC   ketoconazole  1 Application Topical Daily   levothyroxine  75 mcg Oral Q0600   metoprolol succinate  25 mg Oral Daily   multivitamin with minerals  1 tablet Oral Daily   Continuous Infusions:  lactated ringers Stopped (02/12/23 1540)     LOS: 3 days       Charise Killian, MD [facility] Pager [phone]  If 7PM-7AM, please contact night-coverage [email or web address] 02/13/2023, 8:14 AM

## 2023-02-13 NOTE — TOC Progression Note (Signed)
Transition of Care [facility]) - Progression Note    Patient Details  Name: Cheryl Rios MRN: [id number] Date of Birth: 1925-06-04  Transition of Care [facility]) CM/SW Contact  Marlowe Sax, RN Phone Number: 02/13/2023, 4:11 PM  Clinical Narrative:      Met with the patient and family, they are agreeable to go to [facility], she has been to [facility] before They would like to go there again, Fl2, PASSR and bedsearch completed      Expected Discharge Plan and Services                                               Social Determinants of Health (SDOH) Interventions SDOH Screenings   Food Insecurity: No Food Insecurity (01/19/2023)   Received from [facility]  Transportation Needs: No Transportation Needs (01/19/2023)   Received from [facility]  Utilities: Not At Risk (01/19/2023)   Received from [facility]  Financial Resource Strain: Low Risk  (01/19/2023)   Received from [facility]  Tobacco Use: Low Risk  (01/19/2023)   Received from [facility]    Readmission Risk Interventions     No data to display

## 2023-02-13 NOTE — Progress Notes (Signed)
Orthopedic progress note  Chief complaint: Left hip intertrochanteric fracture  Subjective, no acute events, comfortable in bed, denies any chest pain or shortness of breath  Objective, chart reviewed A.m. labs are pending  Operative bandage is without concern.  Small amount of strikethrough which has been stable since surgery and not consistent with any active bleeding on a small part of the proximal bandage, the distal bandage is completely clean and dry. The thigh is soft and compressible.  She moves ankle foot and toes well.  Neurovascularly intact.  No concerns on the exam  Assessment and plan  89 year old female with left hip intertrochanteric fracture status post surgical stabilization with long cephalomedullary nail, now postoperative day #2.  Overall doing well.  She has acute blood loss anemia.  She received 1 unit of packed red blood cells on postoperative day 0 and a second unit on postoperative day 1.  A.m. labs today are pending.  Chemical DVT prophylaxis is on hold.  She has SCDs.  Recommend holding chemical DVT prophylaxis until H&H stabilizes.  LLE WBAT Goal to keep Hb >8 I placed an order for RN team to change dressings tomorrow 9/24.  There are dressings in patient's room in drawer ready to go - AquacelAg.  I am off service today.  Please page ortho on call with any questions/concerns.    Thank you    Nada Boozer, [facility]

## 2023-02-14 DIAGNOSIS — S72002A Fracture of unspecified part of neck of left femur, initial encounter for closed fracture: Secondary | ICD-10-CM | POA: Diagnosis not present

## 2023-02-14 LAB — CBC WITH DIFFERENTIAL/PLATELET
Abs Immature Granulocytes: 0.12 10*3/uL — ABNORMAL HIGH (ref 0.00–0.07)
Basophils Absolute: 0 10*3/uL (ref 0.0–0.1)
Basophils Relative: 0 %
Eosinophils Absolute: 0.2 10*3/uL (ref 0.0–0.5)
Eosinophils Relative: 2 %
HCT: 25.9 % — ABNORMAL LOW (ref 36.0–46.0)
Hemoglobin: 8.3 g/dL — ABNORMAL LOW (ref 12.0–15.0)
Immature Granulocytes: 1 %
Lymphocytes Relative: 12 %
Lymphs Abs: 1 10*3/uL (ref 0.7–4.0)
MCH: 31.2 pg (ref 26.0–34.0)
MCHC: 32 g/dL (ref 30.0–36.0)
MCV: 97.4 fL (ref 80.0–100.0)
Monocytes Absolute: 0.7 10*3/uL (ref 0.1–1.0)
Monocytes Relative: 9 %
Neutro Abs: 6.4 10*3/uL (ref 1.7–7.7)
Neutrophils Relative %: 76 %
Platelets: 126 10*3/uL — ABNORMAL LOW (ref 150–400)
RBC: 2.66 MIL/uL — ABNORMAL LOW (ref 3.87–5.11)
RDW: 16 % — ABNORMAL HIGH (ref 11.5–15.5)
WBC: 8.5 10*3/uL (ref 4.0–10.5)
nRBC: 0.9 % — ABNORMAL HIGH (ref 0.0–0.2)

## 2023-02-14 LAB — BASIC METABOLIC PANEL
Anion gap: 6 (ref 5–15)
BUN: 31 mg/dL — ABNORMAL HIGH (ref 8–23)
CO2: 26 mmol/L (ref 22–32)
Calcium: 8.3 mg/dL — ABNORMAL LOW (ref 8.9–10.3)
Chloride: 103 mmol/L (ref 98–111)
Creatinine, Ser: 0.77 mg/dL (ref 0.44–1.00)
GFR, Estimated: >60 mL/min (ref >60)
Glucose, Bld: 142 mg/dL — ABNORMAL HIGH (ref 70–99)
Potassium: 4.5 mmol/L (ref 3.5–5.1)
Sodium: 135 mmol/L (ref 135–145)

## 2023-02-14 LAB — GLUCOSE, CAPILLARY
Glucose-Capillary: 136 mg/dL — ABNORMAL HIGH (ref 70–99)
Glucose-Capillary: 161 mg/dL — ABNORMAL HIGH (ref 70–99)
Glucose-Capillary: 140 mg/dL — ABNORMAL HIGH (ref 70–99)
Glucose-Capillary: 125 mg/dL — ABNORMAL HIGH (ref 70–99)

## 2023-02-14 MED ORDER — POLYETHYLENE GLYCOL 3350 17 G PO PACK
17.0000 g | PACK | Freq: Every day | ORAL | Status: DC
  Administered 2023-02-14 – 2023-02-15 (×2): 17 g via ORAL
  Filled 2023-02-14 (×2): qty 1

## 2023-02-14 MED ORDER — ENOXAPARIN SODIUM 40 MG/0.4ML IJ SOSY
40.0000 mg | PREFILLED_SYRINGE | INTRAMUSCULAR | Status: DC
  Administered 2023-02-14 – 2023-02-15 (×2): 40 mg via SUBCUTANEOUS
  Filled 2023-02-14 (×2): qty 0.4

## 2023-02-14 MED ORDER — OXYCODONE-ACETAMINOPHEN 5-325 MG PO TABS: 1.0000 | ORAL_TABLET | Freq: Four times a day (QID) | ORAL | 0 refills | Status: DC | PRN

## 2023-02-14 MED ORDER — ASPIRIN 325 MG PO TBEC: 325.0000 mg | DELAYED_RELEASE_TABLET | Freq: Two times a day (BID) | ORAL | 0 refills | Status: DC

## 2023-02-14 MED ORDER — DOCUSATE SODIUM 100 MG PO CAPS
200.0000 mg | ORAL_CAPSULE | Freq: Two times a day (BID) | ORAL | Status: DC
  Administered 2023-02-14 – 2023-02-15 (×3): 200 mg via ORAL
  Filled 2023-02-14 (×3): qty 2

## 2023-02-14 NOTE — Plan of Care (Signed)
  Problem: Coping: Goal: Ability to adjust to condition or change in health will improve Outcome: Progressing   Problem: Fluid Volume: Goal: Ability to maintain a balanced intake and output will improve Outcome: Progressing   Problem: Nutritional: Goal: Maintenance of adequate nutrition will improve Outcome: Progressing   Problem: Nutrition: Goal: Adequate nutrition will be maintained Outcome: Progressing   Problem: Coping: Goal: Level of anxiety will decrease Outcome: Progressing   Problem: Skin Integrity: Goal: Risk for impaired skin integrity will decrease Outcome: Progressing

## 2023-02-14 NOTE — Care Management Important Message (Signed)
Important Message  Patient Details  Name: Cheryl Rios MRN: [id number] Date of Birth: 07/04/1925   Important Message Given:  N/A - LOS <3 / Initial given by admissions     Olegario Messier A Janelle Spellman 02/14/2023, 9:38 AM

## 2023-02-14 NOTE — Progress Notes (Signed)
Physical Therapy Treatment Patient Details Name: Cheryl Rios MRN: [id number] DOB: [birth year] Today's Date: 02/14/2023   History of Present Illness Pt is a 89 y/o female admitted after a mechanical fall in which she sustained a L hip fx s/p cephalomedullary nail.    PT Comments  Pt resting in recliner upon PT arrival; agreeable to therapy.  L hip pain 6/10 at rest beginning of session and 7/10 at rest end of session (nurse updated on pt's pain status).  Pt stood up to walker (with 2 assist) and immediately incontinent of urine; pt assisted back to sitting in recliner (pt assisted with clean-up and new socks donned); pt assisted to/from [facility] with 2 assist (pt noted to be incontinent of urine again on way back to recliner so pt assisted with clean-up and new socks donned again).  Pt appearing fatigued today and unable to walk with 2 assist and walker use.  Nurse updated on pt's status and that pt would benefit from a bath.  Will continue to focus on strengthening and progressive functional mobility during hospitalization.     If plan is discharge home, recommend the following: Two people to help with walking and/or transfers;A lot of help with bathing/dressing/bathroom;Assistance with cooking/housework;Supervision due to cognitive status;Help with stairs or ramp for entrance;Assist for transportation   Can travel by private vehicle     No  Equipment Recommendations  Other (comment) (TBD at next facility)    Recommendations for Other Services       Precautions / Restrictions Precautions Precautions: Fall Restrictions Weight Bearing Restrictions: Yes LLE Weight Bearing: Weight bearing as tolerated     Mobility  Bed Mobility               General bed mobility comments: Deferred (pt in recliner beginning/end of session)    Transfers Overall transfer level: Needs assistance Equipment used: Rolling walker (2 wheels) (youth sized) Transfers: Sit to/from Stand Sit to Stand: Mod  assist, +2 physical assistance Stand pivot transfers: Mod assist, +2 safety/equipment, +2 physical assistance         General transfer comment: x2 trials standing from recliner and x1 trial standing from [facility]; vc's for UE/LE placement and overall technique    Ambulation/Gait               General Gait Details: pt unable to take any steps today with 2 assist and max cueing and walker use   Stairs             Wheelchair Mobility     Tilt Bed    Modified Rankin (Stroke Patients Only)       Balance Overall balance assessment: Needs assistance Sitting-balance support: No upper extremity supported, Feet supported Sitting balance-Leahy Scale: Good Sitting balance - Comments: steady reaching within BOS   Standing balance support: Reliant on assistive device for balance, During functional activity, Bilateral upper extremity supported Standing balance-Leahy Scale: Poor Standing balance comment: assist to steady in standing with B UE support on RW                            Cognition Arousal: Alert Behavior During Therapy: WFL for tasks assessed/performed Overall Cognitive Status: Impaired/Different from baseline Area of Impairment: Safety/judgement, Problem solving                         Safety/Judgement: Decreased awareness of safety, Decreased awareness of deficits   Problem Solving:  Difficulty sequencing, Requires verbal cues, Requires tactile cues          Exercises      General Comments  Nursing cleared pt for participation in physical therapy.  Pt agreeable to PT session.      Pertinent Vitals/Pain Pain Assessment Pain Assessment: 0-10 Pain Score: 7  Pain Location: L hip Pain Descriptors / Indicators: Guarding, Grimacing, Aching, Tender Pain Intervention(s): Limited activity within patient's tolerance, Monitored during session, Premedicated before session, Repositioned, Other (comment) (RN notified regarding pt's  pain) Vitals (HR and SpO2 on room air) stable and WFL throughout treatment session.    Home Living                          Prior Function            PT Goals (current goals can now be found in the care plan section) Acute Rehab PT Goals Patient Stated Goal: decrease pain PT Goal Formulation: With patient Time For Goal Achievement: 02/26/23 Potential to Achieve Goals: Good Progress towards PT goals: Progressing toward goals    Frequency    Min 1X/week      PT Plan      Co-evaluation              AM-PAC PT "6 Clicks" Mobility   Outcome Measure  Help needed turning from your back to your side while in a flat bed without using bedrails?: A Lot Help needed moving from lying on your back to sitting on the side of a flat bed without using bedrails?: A Lot Help needed moving to and from a bed to a chair (including a wheelchair)?: Total Help needed standing up from a chair using your arms (e.g., wheelchair or bedside chair)?: Total Help needed to walk in hospital room?: Total Help needed climbing 3-5 steps with a railing? : Total 6 Click Score: 8    End of Session Equipment Utilized During Treatment: Gait belt Activity Tolerance: Patient limited by fatigue Patient left: in chair;with call bell/phone within reach;with chair alarm set;with family/visitor present Nurse Communication: Mobility status;Precautions;Other (comment);Weight bearing status (pt's pain status and pt incontinent of urine during session (pt would benefit from bath)) PT Visit Diagnosis: Other abnormalities of gait and mobility (R26.89)     Time: 9147-8295 PT Time Calculation (min) (ACUTE ONLY): 43 min  Charges:    $Therapeutic Activity: 38-52 mins PT General Charges $$ ACUTE PT VISIT: 1 Visit                     Hendricks Limes, PT 02/14/23, 12:32 PM

## 2023-02-14 NOTE — Discharge Instructions (Signed)
Diet: As you were doing prior to hospitalization   Shower:  May shower but keep the wounds dry, use an occlusive plastic wrap, NO SOAKING IN TUB.  If the bandage gets wet, change with a clean dry gauze.  Dressing:  You may change your dressing as needed. Change the dressing with sterile gauze dressing.    Activity:  Increase activity slowly as tolerated, but follow the weight bearing instructions below.  No lifting or driving for 6 weeks.  Weight Bearing:   Weight bearing as tolerated to left lower extremity  To prevent constipation: you may use a stool softener such as -  Colace (over the counter) 100 mg by mouth twice a day  Drink plenty of fluids (prune juice may be helpful) and high fiber foods Miralax (over the counter) for constipation as needed.    Itching:  If you experience itching with your medications, try taking only a single pain pill, or even half a pain pill at a time.  You may take up to 10 pain pills per day, and you can also use benadryl over the counter for itching or also to help with sleep.   Precautions:  If you experience chest pain or shortness of breath - call 911 immediately for transfer to the hospital emergency department!!  If you develop a fever greater that 101 F, purulent drainage from wound, increased redness or drainage from wound, or calf pain-Call [facility]                                              Follow- Up Appointment:  Please call for an appointment to be seen in 2 weeks at [facility]

## 2023-02-14 NOTE — Progress Notes (Signed)
PROGRESS NOTE   HPI was taken from Dr. Irving Burton: Cheryl Rios is a 89 y.o. female with medical history significant of HTN, HLD, rheumatoid arthritis, IIDM on diet control, peripheral neuropathy, OSA on CPAP at bedtime, hypothyroidism, osteoporosis, morbid obesity, brought in by family member for fall and left hip pain.   Patient fell out of her left hip when she was trying to enter a car door with the car door slammed her cane and she lost balance she fell on her left hip.  With excruciating pain she could not stand up on her own via.  She also reported when she fell she also hit her left forehead but denied any LOC.  She denies any chest pain shortness of breath lightheadedness before or after the fall.  She has been healthy for her age, uses a cane to ambulate and denies any shortness of breath chest pain associate with exertion.  No history of MI or stroke in the past.   ED Course: Afebrile, none tachycardia blood pressure 120/55 authorization 9 5% on room air.  Hip x-ray showed acute left intertrochanter comminuted fracture.  Blood work showed hemoglobin 12.3, WBC 5.7, glucose 184, creatinine 0.9.  As per Dr. Mayford Knife 9/21-9/24/24: Pt presented w/ left hip fracture s/p repair. Pt has received 2 units of pRBCs. PT/OT recs SNF. Pt has accepted a bed at [facility] and now waiting on insurance auth.     [name]  WUJ:[id number] DOB: [birth year] DOA: 02/10/2023 PCP: Jerl Mina, MD   Assessment & Plan:   Principal Problem:   Hip fracture [facility]) Active Problems:   OSA on CPAP   Hypertension   Hyperlipidemia, unspecified   Closed left hip fracture, initial encounter (HCC)  Assessment and Plan: Left hip fracture: secondary to mechanical fall. S/p surg stabilization of left hip w/ cephalomedullary nail. Percocet, dilaudid prn for pain. Ortho surg following and recs apprec  Acute blood loss anemia: secondary to above recent surg. S/p 2 units of pRBCs transfused so far H&H are  labile    Fall: continue w/ fall precautions. CT head and neck negative for intracranial bleeding, fracture or dislocation   Osteopenia: continue on vitamin D supplement    HTN: hold metoprolol for MAP < 65 and/or HR < 65. Holding lasix, lisinopril    DM2: well controlled, HbA1c 6.2. Continue on SSI w/ accuchecks    Hypothyroidism: continue on levothyroxine    RA: continue on home dose of hydroxychloroquine, qweekly methotrexate   OSA: CPAP qhs  Obesity: BMI 34. Would benefit from weight loss     DVT prophylaxis: lovenox  Code Status: full  Family Communication: discussed pt's care w/ pt's family at bedside and answered their questions Disposition Plan: likely d/c to SNF   Level of care: Med-Surg Status is: Inpatient Remains inpatient appropriate because: medically stable. Waiting on insurance auth    Consultants:  Ortho surg   Procedures:   Antimicrobials:  Subjective: Pt c/o fatigue   Objective: Vitals:   02/13/23 0754 02/13/23 1635 02/13/23 2351 02/14/23 0750  BP: (!) 124/59 (!) 110/46 (!) 105/49 (!) 102/45  Pulse: (!) 103 93 85 87  Resp: 16 17 18 16   Temp: (!) 97.5 F (36.4 C) (!) 97.4 F (36.3 C) 98.5 F (36.9 C) 98.1 F (36.7 C)  TempSrc:  Oral  Oral  SpO2: 94% 96% 91% 90%  Weight:      Height:        Intake/Output Summary (Last 24 hours) at 02/14/2023  the symptomatic side. No evidence of thrombus. Normal compressibility. Internal Jugular Vein: No evidence of thrombus. Normal compressibility, respiratory phasicity and response to augmentation. Subclavian Vein: No evidence of thrombus. Normal compressibility, respiratory phasicity and response to augmentation. Axillary Vein: No evidence of thrombus. Normal compressibility, respiratory phasicity and response to augmentation. Cephalic Vein: No evidence of thrombus. Normal compressibility, respiratory phasicity and response to augmentation. Basilic Vein: No evidence of thrombus. Normal compressibility, respiratory phasicity and response to augmentation. Brachial Veins: No evidence of thrombus. Normal compressibility, respiratory phasicity and response to augmentation. Radial Veins: No evidence of thrombus. Normal compressibility, respiratory phasicity and response to augmentation. Ulnar Veins: No evidence of thrombus. Normal compressibility, respiratory phasicity and response to augmentation. Venous Reflux:  None visualized.  Other Findings:  None visualized. IMPRESSION: No evidence of DVT within the right upper extremity. Electronically Signed   By: Tish Frederickson M.D.   On: 02/13/2023 22:24   ECHOCARDIOGRAM COMPLETE  Result Date: 02/13/2023    ECHOCARDIOGRAM REPORT   Patient Name:   Cheryl Rios Date of Exam: 02/13/2023 Medical Rec #:  [id number]        Height:       61.5 in Accession #:    [id number]       Weight:       185.0 lb Date of Birth:  1925-12-08        BSA:          1.838 m Patient Age:    89 years         BP:           111/53 mmHg Patient Gender: F                HR:           99 bpm. Exam Location:  [facility] Procedure: 2D Echo, Color Doppler and Cardiac Doppler Indications:     Pre-operative cardiovascular examination Z01.810  History:         Patient has no prior history of Echocardiogram examinations.                  Risk Factors:Diabetes, Hypertension and Sleep Apnea.  Sonographer:     Cristela Blue Referring Phys:  [id number] Adelfa Koh ZAFONTE Diagnosing Phys: Lorine Bears MD  Sonographer Comments: Suboptimal apical window. Pt kept stationary in chair due to recent surgery. IMPRESSIONS  1. Left ventricular ejection fraction, by estimation, is 55 to 60%. The left ventricle has normal function. The left ventricle has no regional wall motion abnormalities. There is mild left ventricular hypertrophy. Left ventricular diastolic parameters are consistent with Grade I diastolic dysfunction (impaired relaxation).  2. Right ventricular systolic function is normal. The right ventricular size is normal. There is mildly elevated pulmonary artery systolic pressure.  3. The mitral valve is normal in structure. No evidence of mitral valve regurgitation. No evidence of mitral stenosis.  4. The aortic valve is normal in structure. Aortic valve regurgitation is not visualized. No aortic stenosis is present. FINDINGS  Left Ventricle: Left ventricular ejection fraction, by estimation, is 55 to 60%. The left ventricle has normal  function. The left ventricle has no regional wall motion abnormalities. The left ventricular internal cavity size was normal in size. There is  mild left ventricular hypertrophy. Left ventricular diastolic parameters are consistent with Grade I diastolic dysfunction (impaired relaxation). Right Ventricle: The right ventricular size is normal. No increase in right ventricular wall thickness. Right ventricular systolic function is normal. There is mildly elevated pulmonary  PROGRESS NOTE   HPI was taken from Dr. Irving Burton: Cheryl Rios is a 89 y.o. female with medical history significant of HTN, HLD, rheumatoid arthritis, IIDM on diet control, peripheral neuropathy, OSA on CPAP at bedtime, hypothyroidism, osteoporosis, morbid obesity, brought in by family member for fall and left hip pain.   Patient fell out of her left hip when she was trying to enter a car door with the car door slammed her cane and she lost balance she fell on her left hip.  With excruciating pain she could not stand up on her own via.  She also reported when she fell she also hit her left forehead but denied any LOC.  She denies any chest pain shortness of breath lightheadedness before or after the fall.  She has been healthy for her age, uses a cane to ambulate and denies any shortness of breath chest pain associate with exertion.  No history of MI or stroke in the past.   ED Course: Afebrile, none tachycardia blood pressure 120/55 authorization 9 5% on room air.  Hip x-ray showed acute left intertrochanter comminuted fracture.  Blood work showed hemoglobin 12.3, WBC 5.7, glucose 184, creatinine 0.9.  As per Dr. Mayford Knife 9/21-9/24/24: Pt presented w/ left hip fracture s/p repair. Pt has received 2 units of pRBCs. PT/OT recs SNF. Pt has accepted a bed at [facility] and now waiting on insurance auth.     [name]  WUJ:[id number] DOB: [birth year] DOA: 02/10/2023 PCP: Jerl Mina, MD   Assessment & Plan:   Principal Problem:   Hip fracture [facility]) Active Problems:   OSA on CPAP   Hypertension   Hyperlipidemia, unspecified   Closed left hip fracture, initial encounter (HCC)  Assessment and Plan: Left hip fracture: secondary to mechanical fall. S/p surg stabilization of left hip w/ cephalomedullary nail. Percocet, dilaudid prn for pain. Ortho surg following and recs apprec  Acute blood loss anemia: secondary to above recent surg. S/p 2 units of pRBCs transfused so far H&H are  labile    Fall: continue w/ fall precautions. CT head and neck negative for intracranial bleeding, fracture or dislocation   Osteopenia: continue on vitamin D supplement    HTN: hold metoprolol for MAP < 65 and/or HR < 65. Holding lasix, lisinopril    DM2: well controlled, HbA1c 6.2. Continue on SSI w/ accuchecks    Hypothyroidism: continue on levothyroxine    RA: continue on home dose of hydroxychloroquine, qweekly methotrexate   OSA: CPAP qhs  Obesity: BMI 34. Would benefit from weight loss     DVT prophylaxis: lovenox  Code Status: full  Family Communication: discussed pt's care w/ pt's family at bedside and answered their questions Disposition Plan: likely d/c to SNF   Level of care: Med-Surg Status is: Inpatient Remains inpatient appropriate because: medically stable. Waiting on insurance auth    Consultants:  Ortho surg   Procedures:   Antimicrobials:  Subjective: Pt c/o fatigue   Objective: Vitals:   02/13/23 0754 02/13/23 1635 02/13/23 2351 02/14/23 0750  BP: (!) 124/59 (!) 110/46 (!) 105/49 (!) 102/45  Pulse: (!) 103 93 85 87  Resp: 16 17 18 16   Temp: (!) 97.5 F (36.4 C) (!) 97.4 F (36.3 C) 98.5 F (36.9 C) 98.1 F (36.7 C)  TempSrc:  Oral  Oral  SpO2: 94% 96% 91% 90%  Weight:      Height:        Intake/Output Summary (Last 24 hours) at 02/14/2023  the symptomatic side. No evidence of thrombus. Normal compressibility. Internal Jugular Vein: No evidence of thrombus. Normal compressibility, respiratory phasicity and response to augmentation. Subclavian Vein: No evidence of thrombus. Normal compressibility, respiratory phasicity and response to augmentation. Axillary Vein: No evidence of thrombus. Normal compressibility, respiratory phasicity and response to augmentation. Cephalic Vein: No evidence of thrombus. Normal compressibility, respiratory phasicity and response to augmentation. Basilic Vein: No evidence of thrombus. Normal compressibility, respiratory phasicity and response to augmentation. Brachial Veins: No evidence of thrombus. Normal compressibility, respiratory phasicity and response to augmentation. Radial Veins: No evidence of thrombus. Normal compressibility, respiratory phasicity and response to augmentation. Ulnar Veins: No evidence of thrombus. Normal compressibility, respiratory phasicity and response to augmentation. Venous Reflux:  None visualized.  Other Findings:  None visualized. IMPRESSION: No evidence of DVT within the right upper extremity. Electronically Signed   By: Tish Frederickson M.D.   On: 02/13/2023 22:24   ECHOCARDIOGRAM COMPLETE  Result Date: 02/13/2023    ECHOCARDIOGRAM REPORT   Patient Name:   Cheryl Rios Date of Exam: 02/13/2023 Medical Rec #:  [id number]        Height:       61.5 in Accession #:    [id number]       Weight:       185.0 lb Date of Birth:  1925-12-08        BSA:          1.838 m Patient Age:    89 years         BP:           111/53 mmHg Patient Gender: F                HR:           99 bpm. Exam Location:  [facility] Procedure: 2D Echo, Color Doppler and Cardiac Doppler Indications:     Pre-operative cardiovascular examination Z01.810  History:         Patient has no prior history of Echocardiogram examinations.                  Risk Factors:Diabetes, Hypertension and Sleep Apnea.  Sonographer:     Cristela Blue Referring Phys:  [id number] Adelfa Koh ZAFONTE Diagnosing Phys: Lorine Bears MD  Sonographer Comments: Suboptimal apical window. Pt kept stationary in chair due to recent surgery. IMPRESSIONS  1. Left ventricular ejection fraction, by estimation, is 55 to 60%. The left ventricle has normal function. The left ventricle has no regional wall motion abnormalities. There is mild left ventricular hypertrophy. Left ventricular diastolic parameters are consistent with Grade I diastolic dysfunction (impaired relaxation).  2. Right ventricular systolic function is normal. The right ventricular size is normal. There is mildly elevated pulmonary artery systolic pressure.  3. The mitral valve is normal in structure. No evidence of mitral valve regurgitation. No evidence of mitral stenosis.  4. The aortic valve is normal in structure. Aortic valve regurgitation is not visualized. No aortic stenosis is present. FINDINGS  Left Ventricle: Left ventricular ejection fraction, by estimation, is 55 to 60%. The left ventricle has normal  function. The left ventricle has no regional wall motion abnormalities. The left ventricular internal cavity size was normal in size. There is  mild left ventricular hypertrophy. Left ventricular diastolic parameters are consistent with Grade I diastolic dysfunction (impaired relaxation). Right Ventricle: The right ventricular size is normal. No increase in right ventricular wall thickness. Right ventricular systolic function is normal. There is mildly elevated pulmonary  PROGRESS NOTE   HPI was taken from Dr. Irving Burton: Cheryl Rios is a 89 y.o. female with medical history significant of HTN, HLD, rheumatoid arthritis, IIDM on diet control, peripheral neuropathy, OSA on CPAP at bedtime, hypothyroidism, osteoporosis, morbid obesity, brought in by family member for fall and left hip pain.   Patient fell out of her left hip when she was trying to enter a car door with the car door slammed her cane and she lost balance she fell on her left hip.  With excruciating pain she could not stand up on her own via.  She also reported when she fell she also hit her left forehead but denied any LOC.  She denies any chest pain shortness of breath lightheadedness before or after the fall.  She has been healthy for her age, uses a cane to ambulate and denies any shortness of breath chest pain associate with exertion.  No history of MI or stroke in the past.   ED Course: Afebrile, none tachycardia blood pressure 120/55 authorization 9 5% on room air.  Hip x-ray showed acute left intertrochanter comminuted fracture.  Blood work showed hemoglobin 12.3, WBC 5.7, glucose 184, creatinine 0.9.  As per Dr. Mayford Knife 9/21-9/24/24: Pt presented w/ left hip fracture s/p repair. Pt has received 2 units of pRBCs. PT/OT recs SNF. Pt has accepted a bed at [facility] and now waiting on insurance auth.     [name]  WUJ:[id number] DOB: [birth year] DOA: 02/10/2023 PCP: Jerl Mina, MD   Assessment & Plan:   Principal Problem:   Hip fracture [facility]) Active Problems:   OSA on CPAP   Hypertension   Hyperlipidemia, unspecified   Closed left hip fracture, initial encounter (HCC)  Assessment and Plan: Left hip fracture: secondary to mechanical fall. S/p surg stabilization of left hip w/ cephalomedullary nail. Percocet, dilaudid prn for pain. Ortho surg following and recs apprec  Acute blood loss anemia: secondary to above recent surg. S/p 2 units of pRBCs transfused so far H&H are  labile    Fall: continue w/ fall precautions. CT head and neck negative for intracranial bleeding, fracture or dislocation   Osteopenia: continue on vitamin D supplement    HTN: hold metoprolol for MAP < 65 and/or HR < 65. Holding lasix, lisinopril    DM2: well controlled, HbA1c 6.2. Continue on SSI w/ accuchecks    Hypothyroidism: continue on levothyroxine    RA: continue on home dose of hydroxychloroquine, qweekly methotrexate   OSA: CPAP qhs  Obesity: BMI 34. Would benefit from weight loss     DVT prophylaxis: lovenox  Code Status: full  Family Communication: discussed pt's care w/ pt's family at bedside and answered their questions Disposition Plan: likely d/c to SNF   Level of care: Med-Surg Status is: Inpatient Remains inpatient appropriate because: medically stable. Waiting on insurance auth    Consultants:  Ortho surg   Procedures:   Antimicrobials:  Subjective: Pt c/o fatigue   Objective: Vitals:   02/13/23 0754 02/13/23 1635 02/13/23 2351 02/14/23 0750  BP: (!) 124/59 (!) 110/46 (!) 105/49 (!) 102/45  Pulse: (!) 103 93 85 87  Resp: 16 17 18 16   Temp: (!) 97.5 F (36.4 C) (!) 97.4 F (36.3 C) 98.5 F (36.9 C) 98.1 F (36.7 C)  TempSrc:  Oral  Oral  SpO2: 94% 96% 91% 90%  Weight:      Height:        Intake/Output Summary (Last 24 hours) at 02/14/2023

## 2023-02-14 NOTE — Progress Notes (Signed)
  Subjective: 3 Days Post-Op Procedure(s) (LRB): INTRAMEDULLARY (IM) NAIL FEMORAL (Left) Patient reports pain as moderate.   Patient is well, and has had no acute complaints or problems Plan is to go Skilled nursing facility after hospital stay. Negative for chest pain and shortness of breath Fever: no Gastrointestinal:Negative for nausea and vomiting  Objective: Vital signs in last 24 hours: Temp:  [97.4 F (36.3 C)-98.5 F (36.9 C)] 98.5 F (36.9 C) (09/23 2351) Pulse Rate:  [85-103] 85 (09/23 2351) Resp:  [16-18] 18 (09/23 2351) BP: (105-124)/(46-59) 105/49 (09/23 2351) SpO2:  [91 %-96 %] 91 % (09/23 2351) FiO2 (%):  [21 %] 21 % (09/23 2230)  Intake/Output from previous day:  Intake/Output Summary (Last 24 hours) at 02/14/2023 0736 Last data filed at 02/13/2023 1920 Gross per 24 hour  Intake 120 ml  Output --  Net 120 ml    Intake/Output this shift: No intake/output data recorded.  Labs: Recent Labs    02/11/23 1249 02/11/23 2116 02/12/23 0418 02/12/23 1154 02/13/23 0624  HGB 9.3* 7.6* 8.2* 7.8* 8.6*   Recent Labs    02/12/23 0418 02/12/23 1154 02/13/23 0624  WBC 11.4*  --  9.2  RBC 2.61*  --  2.69*  HCT 24.3* 23.7* 24.9*  PLT 134*  --  137*   Recent Labs    02/12/23 0418 02/13/23 0624  NA 136 137  K 4.7 4.4  CL 105 106  CO2 22 23  BUN 45* 41*  CREATININE 1.55* 0.93  GLUCOSE 200* 168*  CALCIUM 8.1* 8.4*   No results for input(s): "LABPT", "INR" in the last 72 hours.   EXAM General - Patient is Alert, Appropriate, and Oriented Extremity - ABD soft Neurovascular intact Dorsiflexion/Plantar flexion intact Incision: scant drainage No cellulitis present Compartment soft Dressing/Incision - Mild bloody drainage noted to the most proximal hip incision site. Motor Function - intact, moving foot and toes well on exam.  Abdomen soft with intact bowel sounds.  Past Medical History:  Diagnosis Date   Arrhythmia    Arthritis    Diabetes  mellitus without complication (HCC)    Diverticulosis    Dysrhythmia    Edema of both feet    HOH (hard of hearing)    Hypertension    Hypothyroidism    Neuropathy    Osteoporosis    Sleep apnea    OSA--Use C-PAP   Thyroid disease     Assessment/Plan: 3 Days Post-Op Procedure(s) (LRB): INTRAMEDULLARY (IM) NAIL FEMORAL (Left) Principal Problem:   Hip fracture (HCC) Active Problems:   OSA on CPAP   Hypertension   Hyperlipidemia, unspecified   Closed left hip fracture, initial encounter (HCC)  Estimated body mass index is 34.39 kg/m as calculated from the following:   Height as of this encounter: 5' 1.5" (1.562 m).   Weight as of this encounter: 83.9 kg. Advance diet Up with therapy D/C IV fluids when tolerating po intake.  Vitals reviewed this AM, BP 105/49.  No tachycardia. Labs pending, patient is s/p two transfusions of 1 unit of PRBC.  Hg yesterday 8.6. Up with therapy today, plan is for d/c to SNF. Continue to work on BM. Following discharge, follow-up with [facility] in 10-14 days for skin check and x-rays. At discharge, start on 1 325mg  aspirin twice daily for DVT prophylaxis.  DVT Prophylaxis - TED hose and SCDs Weight-Bearing as tolerated to left leg  J. Horris Latino, PA-C [facility] 02/14/2023, 7:36 AM

## 2023-02-14 NOTE — TOC Progression Note (Signed)
Transition of Care [facility]) - Progression Note    Patient Details  Name: Cheryl Rios MRN: [id number] Date of Birth: 1925/11/26  Transition of [facility]) CM/SW Contact  Marlowe Sax, RN Phone Number: 02/14/2023, 12:57 PM  Clinical Narrative:     Met with the patient and her family in the room and reviewed the bed offers, she has been to [facility] int he past and would like to go to [facility], I accepted the bed offer with Tiffany at [facility]                                               Social Determinants of Health (SDOH) Interventions SDOH Screenings   Food Insecurity: No Food Insecurity (01/19/2023)   Received from [facility]  Transportation Needs: No Transportation Needs (01/19/2023)   Received from [facility]  Utilities: Not At Risk (01/19/2023)   Received from [facility]  Financial Resource Strain: Low Risk  (01/19/2023)   Received from [facility]  Tobacco Use: Low Risk  (01/19/2023)   Received from [facility]    Readmission Risk Interventions     No data to display

## 2023-02-14 NOTE — Plan of Care (Signed)
  Problem: Coping: Goal: Ability to adjust to condition or change in health will improve Outcome: Progressing   Problem: Skin Integrity: Goal: Risk for impaired skin integrity will decrease Outcome: Progressing   Problem: Nutrition: Goal: Adequate nutrition will be maintained Outcome: Progressing   Problem: Activity: Goal: Risk for activity intolerance will decrease Outcome: Progressing   Problem: Pain Managment: Goal: General experience of comfort will improve Outcome: Progressing   Problem: Safety: Goal: Ability to remain free from injury will improve Outcome: Progressing   Problem: Elimination: Goal: Will not experience complications related to bowel motility Outcome: Progressing ( started on bowel regimen 02/14/23)

## 2023-02-15 DIAGNOSIS — F32A Depression, unspecified: Secondary | ICD-10-CM | POA: Insufficient documentation

## 2023-02-15 DIAGNOSIS — M069 Rheumatoid arthritis, unspecified: Secondary | ICD-10-CM | POA: Insufficient documentation

## 2023-02-15 DIAGNOSIS — E1169 Type 2 diabetes mellitus with other specified complication: Secondary | ICD-10-CM | POA: Insufficient documentation

## 2023-02-15 DIAGNOSIS — E039 Hypothyroidism, unspecified: Secondary | ICD-10-CM | POA: Diagnosis present

## 2023-02-15 DIAGNOSIS — S72002A Fracture of unspecified part of neck of left femur, initial encounter for closed fracture: Secondary | ICD-10-CM | POA: Diagnosis not present

## 2023-02-15 LAB — CBC
HCT: 25.1 % — ABNORMAL LOW (ref 36.0–46.0)
Hemoglobin: 8.1 g/dL — ABNORMAL LOW (ref 12.0–15.0)
MCH: 31.6 pg (ref 26.0–34.0)
MCHC: 32.3 g/dL (ref 30.0–36.0)
MCV: 98 fL (ref 80.0–100.0)
Platelets: 162 10*3/uL (ref 150–400)
RBC: 2.56 MIL/uL — ABNORMAL LOW (ref 3.87–5.11)
RDW: 16 % — ABNORMAL HIGH (ref 11.5–15.5)
WBC: 8.3 10*3/uL (ref 4.0–10.5)
nRBC: 1 % — ABNORMAL HIGH (ref 0.0–0.2)

## 2023-02-15 LAB — BASIC METABOLIC PANEL
Anion gap: 6 (ref 5–15)
BUN: 27 mg/dL — ABNORMAL HIGH (ref 8–23)
CO2: 23 mmol/L (ref 22–32)
Calcium: 8.2 mg/dL — ABNORMAL LOW (ref 8.9–10.3)
Chloride: 104 mmol/L (ref 98–111)
Creatinine, Ser: 0.72 mg/dL (ref 0.44–1.00)
GFR, Estimated: >60 mL/min (ref >60)
Glucose, Bld: 125 mg/dL — ABNORMAL HIGH (ref 70–99)
Potassium: 5.1 mmol/L (ref 3.5–5.1)
Sodium: 133 mmol/L — ABNORMAL LOW (ref 135–145)

## 2023-02-15 LAB — GLUCOSE, CAPILLARY
Glucose-Capillary: 142 mg/dL — ABNORMAL HIGH (ref 70–99)
Glucose-Capillary: 158 mg/dL — ABNORMAL HIGH (ref 70–99)

## 2023-02-15 MED ORDER — POLYETHYLENE GLYCOL 3350 17 G PO PACK: 17.0000 g | PACK | Freq: Every day | ORAL | Status: AC

## 2023-02-15 MED ORDER — SORBITOL 70 % SOLN
960.0000 mL | TOPICAL_OIL | Freq: Once | ORAL | Status: AC
  Administered 2023-02-15: 960 mL via RECTAL
  Filled 2023-02-15: qty 240

## 2023-02-15 MED ORDER — ACETAMINOPHEN 500 MG PO TABS: 1000.0000 mg | ORAL_TABLET | Freq: Three times a day (TID) | ORAL | Status: AC | PRN

## 2023-02-15 MED ORDER — ASPIRIN 325 MG PO TBEC: 325.0000 mg | DELAYED_RELEASE_TABLET | Freq: Two times a day (BID) | ORAL | Status: AC

## 2023-02-15 MED ORDER — GLUCERNA SHAKE PO LIQD: 237.0000 mL | Freq: Three times a day (TID) | ORAL | Status: AC

## 2023-02-15 MED ORDER — ADULT MULTIVITAMIN W/MINERALS CH: 1.0000 | ORAL_TABLET | Freq: Every day | ORAL | Status: AC

## 2023-02-15 NOTE — Progress Notes (Signed)
[facility] was called and report was given to nurse Amy. Patient is waiting to be transported by EMS.

## 2023-02-15 NOTE — Care Management Important Message (Signed)
Important Message  Patient Details  Name: Cheryl Rios MRN: [id number] Date of Birth: 05/23/1926   Important Message Given:  Yes - Medicare IM     [name] Temesgen Weightman 02/15/2023, 2:28 PM

## 2023-02-15 NOTE — Progress Notes (Signed)
Subjective: 4 Days Post-Op Procedure(s) (LRB): INTRAMEDULLARY (IM) NAIL FEMORAL (Left) Patient reports pain as moderate.   Patient is well, and has had no acute complaints or problems Plan is to go Skilled nursing facility after hospital stay. Negative for chest pain and shortness of breath Fever: no Gastrointestinal:Negative for nausea and vomiting Patient states that she still has not voided her bowels, reports urinating without any issue. States that she has also been up intermittently with PT, but has only been able to transfer from bed to chair and from chair to commode.   Objective: Vital signs in last 24 hours: Temp:  [97.6 F (36.4 C)-98.4 F (36.9 C)] 98.4 F (36.9 C) (09/24 2310) Pulse Rate:  [86-90] 90 (09/24 2310) Resp:  [16-18] 18 (09/24 2310) BP: (102-117)/(45-57) 117/57 (09/24 2310) SpO2:  [90 %-95 %] 95 % (09/25 0300) FiO2 (%):  [21 %] 21 % (09/25 0300)  Intake/Output from previous day:  Intake/Output Summary (Last 24 hours) at 02/15/2023 0631 Last data filed at 02/15/2023 0600 Gross per 24 hour  Intake 240 ml  Output 250 ml  Net -10 ml    Intake/Output this shift: Total I/O In: -  Out: 250 [Urine:250]  Labs: Recent Labs    02/12/23 1154 02/13/23 0624 02/14/23 0718  HGB 7.8* 8.6* 8.3*   Recent Labs    02/13/23 0624 02/14/23 0718  WBC 9.2 8.5  RBC 2.69* 2.66*  HCT 24.9* 25.9*  PLT 137* 126*   Recent Labs    02/14/23 0718 02/15/23 0539  NA 135 133*  K 4.5 5.1  CL 103 104  CO2 26 23  BUN 31* 27*  CREATININE 0.77 0.72  GLUCOSE 142* 125*  CALCIUM 8.3* 8.2*   No results for input(s): "LABPT", "INR" in the last 72 hours.   EXAM General - Patient is Alert, Appropriate, and Oriented Extremity - ABD soft Neurovascular intact Dorsiflexion/Plantar flexion intact Incision: scant drainage No cellulitis present Compartment soft Dressing/Incision - Mild bloody drainage noted to the most proximal hip incision site appreciated on the  Aquacel bandage.  Distal bandage intact, C/D/I Motor Function - intact, moving foot and toes well on exam.  Able to plantar and dorsiflex with good strength and ROM. Abdomen soft  Past Medical History:  Diagnosis Date   Arrhythmia    Arthritis    Diabetes mellitus without complication (HCC)    Diverticulosis    Dysrhythmia    Edema of both feet    HOH (hard of hearing)    Hypertension    Hypothyroidism    Neuropathy    Osteoporosis    Sleep apnea    OSA--Use C-PAP   Thyroid disease     Assessment/Plan: 4 Days Post-Op Procedure(s) (LRB): INTRAMEDULLARY (IM) NAIL FEMORAL (Left) Principal Problem:   Hip fracture (HCC) Active Problems:   OSA on CPAP   Hypertension   Hyperlipidemia, unspecified   Closed left hip fracture, initial encounter (HCC)  Estimated body mass index is 34.39 kg/m as calculated from the following:   Height as of this encounter: 5' 1.5" (1.562 m).   Weight as of this encounter: 83.9 kg. Advance diet Up with therapy D/C IV fluids when tolerating po intake.  Vitals reviewed this AM, BP 117/57.  No tachycardia. Labs pending, patient is s/p two transfusions of 1 unit of PRBC.  Hg yesterday 8.3 as of 02/14/2023, labs today pending. Up with therapy today, plan is for d/c to SNF pending placement. Continue to work on BM. Following discharge, follow-up with [facility] in 10-14 days for skin check and x-rays. At discharge, start on 1 325mg  aspirin twice daily for DVT prophylaxis.  DVT Prophylaxis - TED hose and SCDs Weight-Bearing as tolerated to left leg  Danise Edge, PA-C [facility] 02/15/2023, 6:31 AM

## 2023-02-15 NOTE — Discharge Summary (Signed)
Physician Discharge Summary   Cheryl Rios  female DOB: [birth year]  WGN:[id number]  PCP: Jerl Mina, MD  Admit date: 02/10/2023 Discharge date: 02/15/2023  Admitted From: home Disposition:  SNF rehab CODE STATUS: DNR  Discharge Instructions     Discharge instructions   Complete by: As directed    Following discharge, follow-up with [facility] in 10-14 days for skin check and x-rays. At discharge, start on 325mg  aspirin twice daily for DVT prophylaxis, for 30 days, or until further instruction with Orthopaedics f/u.   Weight-Bearing as tolerated to left leg [facility] Course:  For full details, please see H&P, progress notes, consult notes and ancillary notes.  Briefly,  Cheryl Rios is a 89 y.o. female with medical history significant of HTN, HLD, rheumatoid arthritis, IIDM on diet control, peripheral neuropathy, OSA on CPAP at bedtime, hypothyroidism, osteoporosis, morbid obesity, brought in by family member for fall and left hip pain.   Patient fell out of her left hip when she was trying to enter a car door with the car door slammed her cane and she lost balance she fell on her left hip.   Left hip fracture:  S/p INTRAMEDULLARY (IM) NAIL on 02/11/23 --Percocet prn for pain.  --Weight-Bearing as tolerated to left leg  --At discharge, start on 325mg  aspirin twice daily for DVT prophylaxis.  --follow-up with [facility] in 10-14 days for skin check and x-rays.    Acute blood loss anemia:  secondary to above recent surg.  Hgb nadir 7.6.  Pt received 2 units of pRBCs.  Hgb stable around 8's prior to discharge.   Fall:  CT head and neck negative for intracranial bleeding, fracture or dislocation   HTN:  --Not on Toprol PTA --resume home Lasix and Lisinopril after discharge. --d/c home hydralazine 50 mg daily   DM2:  well controlled, HbA1c 6.2.    Hypothyroidism:  continue on levothyroxine    RA:  continue home q weekly methotrexate     OSA:  CPAP qhs   Obesity: BMI 34.  Would benefit from weight loss   Unless noted above, medications under "STOP" list are ones pt was not taking PTA.  Discharge Diagnoses:  Principal Problem:   Hip fracture (HCC) Active Problems:   OSA on CPAP   Hypertension   Hyperlipidemia, unspecified   Closed left hip fracture, initial encounter (HCC)   30 Day Unplanned Readmission Risk Score    Flowsheet Row ED to Hosp-Admission (Current) from 02/10/2023 in [facility] (1A)  30 Day Unplanned Readmission Risk Score (%) 16.19 Filed at 02/15/2023 1200       This score is the patient's risk of an unplanned readmission within 30 days of being discharged (0 -100%). The score is based on dignosis, age, lab data, medications, orders, and past utilization.   Low:  0-14.9   Medium: 15-21.9   High: 22-29.9   Extreme: 30 and above         Discharge Instructions:  Allergies as of 02/15/2023   No Known Allergies      Medication List     STOP taking these medications    hydrALAZINE 50 MG tablet Commonly known as: APRESOLINE   potassium chloride 10 MEQ tablet Commonly known as: KLOR-CON   Vitamin B-Complex Tabs       TAKE these medications    Accu-Chek Softclix Lancets lancets USE 1 EACH ONCE DAILY. USE AS INSTRUCTED.   acetaminophen 500  MG tablet Commonly known as: TYLENOL Take 2 tablets (1,000 mg total) by mouth every 8 (eight) hours as needed for mild pain, fever or headache.   allopurinol 100 MG tablet Commonly known as: ZYLOPRIM Take 1.5 tablets by mouth daily.   ammonium lactate 12 % cream Commonly known as: AMLACTIN Apply 1 Application topically as needed for dry skin.   aspirin EC 325 MG tablet Take 1 tablet (325 mg total) by mouth 2 (two) times daily.   DULoxetine 20 MG capsule Commonly known as: CYMBALTA Take 40 mg by mouth daily.   feeding supplement (GLUCERNA SHAKE) Liqd Take 237 mLs by mouth 3 (three) times daily  between meals.   folic acid 1 MG tablet Commonly known as: FOLVITE Take 1 mg by mouth daily.   furosemide 40 MG tablet Commonly known as: LASIX Take 40 mg by mouth daily. In am.   gabapentin 100 MG capsule Commonly known as: NEURONTIN Take 200 mg by mouth 3 (three) times daily.   glucose blood test strip TEST BLOOD SUGAR ONCE DAILY   ketoconazole 2 % cream Commonly known as: NIZORAL Apply 1 Application topically daily.   levothyroxine 75 MCG tablet Commonly known as: SYNTHROID TAKE 1 TABLET BY MOUTH ONCE DAILY ON EMPTY STOMACH WITH WATER 30-60 MIN BEFORE BREAKFAST   lisinopril 20 MG tablet Commonly known as: ZESTRIL TAKE 1 TABLET (20 MG TOTAL) BY MOUTH ONCE DAILY.   methotrexate 2.5 MG tablet Commonly known as: RHEUMATREX Take 15 mg by mouth once a week.   multivitamin with minerals Tabs tablet Take 1 tablet by mouth daily. Start taking on: February 16, 2023   nystatin cream Commonly known as: MYCOSTATIN Apply 1 Application topically 2 (two) times daily.   oxyCODONE-acetaminophen 5-325 MG tablet Commonly known as: PERCOCET/ROXICET Take 1 tablet by mouth every 6 (six) hours as needed for moderate pain.   polyethylene glycol 17 g packet Commonly known as: MIRALAX / GLYCOLAX Take 17 g by mouth daily for 7 days. Start taking on: February 16, 2023         Contact information for follow-up providers     Anson Oregon, PA-C Follow up in 14 day(s).   Specialty: Physician Assistant Why: Skin check and x-rays. Contact information: [address] [place] [address] [phone]         Jerl Mina, MD Follow up.   Specialty: Family Medicine Contact information: [address] [place] [address] [phone]              Contact information for after-discharge care     Destination     [facility] Preferred [facility] .   Service: Skilled Nursing Contact  information: [address] [place] [address] [phone]                     No Known Allergies   The results of significant diagnostics from this hospitalization (including imaging, microbiology, ancillary and laboratory) are listed below for reference.   Consultations:   Procedures/Studies: US Venous Img Upper Uni Right(DVT)  Result Date: 02/13/2023 CLINICAL DATA:  [id number] Edema of right upper arm [id number].  Diabetes EXAM: Right UPPER EXTREMITY VENOUS DOPPLER ULTRASOUND TECHNIQUE: Gray-scale sonography with graded compression, as well as color Doppler and duplex ultrasound were performed to evaluate the upper extremity deep venous system from the level of the subclavian vein and including the jugular, axillary, basilic, radial, ulnar and upper cephalic  vein. Spectral Doppler was utilized to evaluate flow at rest and with distal augmentation maneuvers. COMPARISON:  None Available. FINDINGS: Contralateral Subclavian Vein: Respiratory phasicity is normal and symmetric with the symptomatic side. No evidence of thrombus. Normal compressibility. Internal Jugular Vein: No evidence of thrombus. Normal compressibility, respiratory phasicity and response to augmentation. Subclavian Vein: No evidence of thrombus. Normal compressibility, respiratory phasicity and response to augmentation. Axillary Vein: No evidence of thrombus. Normal compressibility, respiratory phasicity and response to augmentation. Cephalic Vein: No evidence of thrombus. Normal compressibility, respiratory phasicity and response to augmentation. Basilic Vein: No evidence of thrombus. Normal compressibility, respiratory phasicity and response to augmentation. Brachial Veins: No evidence of thrombus. Normal compressibility, respiratory phasicity and response to augmentation. Radial Veins: No evidence of thrombus. Normal compressibility, respiratory phasicity and response to augmentation. Ulnar Veins: No  evidence of thrombus. Normal compressibility, respiratory phasicity and response to augmentation. Venous Reflux:  None visualized. Other Findings:  None visualized. IMPRESSION: No evidence of DVT within the right upper extremity. Electronically Signed   By: Tish Frederickson M.D.   On: 02/13/2023 22:24   ECHOCARDIOGRAM COMPLETE  Result Date: 02/13/2023    ECHOCARDIOGRAM REPORT   Patient Name:   Cheryl Rios Date of Exam: 02/13/2023 Medical Rec #:  [id number]        Height:       61.5 in Accession #:    [id number]       Weight:       185.0 lb Date of Birth:  March 27, 1926        BSA:          1.838 m Patient Age:    89 years         BP:           111/53 mmHg Patient Gender: F                HR:           99 bpm. Exam Location:  [facility] Procedure: 2D Echo, Color Doppler and Cardiac Doppler Indications:     Pre-operative cardiovascular examination Z01.810  History:         Patient has no prior history of Echocardiogram examinations.                  Risk Factors:Diabetes, Hypertension and Sleep Apnea.  Sonographer:     Cristela Blue Referring Phys:  [id number] Adelfa Koh ZAFONTE Diagnosing Phys: Lorine Bears MD  Sonographer Comments: Suboptimal apical window. Pt kept stationary in chair due to recent surgery. IMPRESSIONS  1. Left ventricular ejection fraction, by estimation, is 55 to 60%. The left ventricle has normal function. The left ventricle has no regional wall motion abnormalities. There is mild left ventricular hypertrophy. Left ventricular diastolic parameters are consistent with Grade I diastolic dysfunction (impaired relaxation).  2. Right ventricular systolic function is normal. The right ventricular size is normal. There is mildly elevated pulmonary artery systolic pressure.  3. The mitral valve is normal in structure. No evidence of mitral valve regurgitation. No evidence of mitral stenosis.  4. The aortic valve is normal in structure. Aortic valve regurgitation is not visualized. No aortic stenosis is  present. FINDINGS  Left Ventricle: Left ventricular ejection fraction, by estimation, is 55 to 60%. The left ventricle has normal function. The left ventricle has no regional wall motion abnormalities. The left ventricular internal cavity size was normal in size. There is  mild left ventricular hypertrophy. Left ventricular diastolic parameters are consistent with Grade I  diastolic dysfunction (impaired relaxation). Right Ventricle: The right ventricular size is normal. No increase in right ventricular wall thickness. Right ventricular systolic function is normal. There is mildly elevated pulmonary artery systolic pressure. The tricuspid regurgitant velocity is 2.87  m/s, and with an assumed right atrial pressure of 5 mmHg, the estimated right ventricular systolic pressure is 37.9 mmHg. Left Atrium: Left atrial size was normal in size. Right Atrium: Right atrial size was normal in size. Pericardium: There is no evidence of pericardial effusion. Mitral Valve: The mitral valve is normal in structure. No evidence of mitral valve regurgitation. No evidence of mitral valve stenosis. Tricuspid Valve: The tricuspid valve is normal in structure. Tricuspid valve regurgitation is trivial. No evidence of tricuspid stenosis. Aortic Valve: The aortic valve is normal in structure. Aortic valve regurgitation is not visualized. No aortic stenosis is present. Aortic valve mean gradient measures 3.0 mmHg. Aortic valve peak gradient measures 5.7 mmHg. Aortic valve area, by VTI measures 2.36 cm. Pulmonic Valve: The pulmonic valve was normal in structure. Pulmonic valve regurgitation is not visualized. No evidence of pulmonic stenosis. Aorta: The aortic root is normal in size and structure. Venous: The inferior vena cava was not well visualized. IAS/Shunts: No atrial level shunt detected by color flow Doppler.  LEFT VENTRICLE PLAX 2D LVIDd:         4.60 cm   Diastology LVIDs:         3.20 cm   LV e' medial:    9.03 cm/s LV PW:          1.50 cm   LV E/e' medial:  6.3 LV IVS:        1.20 cm   LV e' lateral:   8.49 cm/s LVOT diam:     2.10 cm   LV E/e' lateral: 6.7 LV SV:         50 LV SV Index:   27 LVOT Area:     3.46 cm  RIGHT VENTRICLE RV Basal diam:  2.30 cm RV Mid diam:    2.40 cm LEFT ATRIUM             Index        RIGHT ATRIUM           Index LA diam:        3.30 cm 1.80 cm/m   RA Area:     12.10 cm LA Vol (A2C):   34.3 ml 18.66 ml/m  RA Volume:   26.30 ml  14.31 ml/m LA Vol (A4C):   37.3 ml 20.29 ml/m LA Biplane Vol: 39.4 ml 21.43 ml/m  AORTIC VALVE AV Area (Vmax):    2.18 cm AV Area (Vmean):   2.34 cm AV Area (VTI):     2.36 cm AV Vmax:           119.00 cm/s AV Vmean:          76.600 cm/s AV VTI:            0.210 m AV Peak Grad:      5.7 mmHg AV Mean Grad:      3.0 mmHg LVOT Vmax:         75.00 cm/s LVOT Vmean:        51.800 cm/s LVOT VTI:          0.143 m LVOT/AV VTI ratio: 0.68  AORTA Ao Root diam: 3.30 cm MITRAL VALVE               TRICUSPID VALVE  MV Area (PHT): 7.29 cm    TR Peak grad:   32.9 mmHg MV Decel Time: 104 msec    TR Vmax:        287.00 cm/s MV E velocity: 57.10 cm/s MV A velocity: 90.10 cm/s  SHUNTS MV E/A ratio:  0.63        Systemic VTI:  0.14 m                            Systemic Diam: 2.10 cm Lorine Bears MD Electronically signed by Lorine Bears MD Signature Date/Time: 02/13/2023/11:55:27 AM    Final    DG HIP UNILAT WITH PELVIS 2-3 VIEWS LEFT  Result Date: 02/11/2023 CLINICAL DATA:  [id number] Surgery, elective [id number] EXAM: DG HIP (WITH OR WITHOUT PELVIS) 2-3V LEFT COMPARISON:  February 10, 2023 FINDINGS: Spot fluoroscopy images were obtained for surgical planning purposes. Spot fluoroscopy images demonstrate placement an intramedullary rod. Persistent displacement of the lesser trochanter. Time: 4 minutes 42 seconds Dose: 83.28 mGy Please reference procedure report for further details. IMPRESSION: Spot fluoroscopy images for surgical planning purposes. Electronically Signed   By: Meda Klinefelter M.D.    On: 02/11/2023 15:14   DG FEMUR PORT MIN 2 VIEWS LEFT  Result Date: 02/11/2023 CLINICAL DATA:  Status post femoral nail EXAM: LEFT FEMUR PORTABLE 2 VIEWS COMPARISON:  Preoperative hip radiographs 1 day prior FINDINGS: There has been interval intramedullary nail fixation of the left intertrochanteric fracture with improved fracture alignment. Hardware alignment is within expected limits, without evidence of complication. Postsurgical changes reflecting left knee arthroplasty are also noted without evidence of complication. Femoroacetabular alignment is maintained. IMPRESSION: Status post intramedullary nail fixation of the intertrochanteric fracture with improved alignment and no evidence of complication. Electronically Signed   By: Lesia Hausen M.D.   On: 02/11/2023 13:30   DG C-Arm 1-60 Min-No Report  Result Date: 02/11/2023 Fluoroscopy was utilized by the requesting physician.  No radiographic interpretation.   DG C-Arm 1-60 Min-No Report  Result Date: 02/11/2023 Fluoroscopy was utilized by the requesting physician.  No radiographic interpretation.   DG C-Arm 1-60 Min-No Report  Result Date: 02/11/2023 Fluoroscopy was utilized by the requesting physician.  No radiographic interpretation.   [place] OR NERVE BLOCK-IMAGE ONLY [facility])  Result Date: 02/11/2023 There is no interpretation for this exam.  This order is for images obtained during a surgical procedure.  Please See "Surgeries" Tab for more information regarding the procedure.   CT Head Wo Contrast  Result Date: 02/10/2023 CLINICAL DATA:  Fall, head and neck pain. EXAM: CT HEAD WITHOUT CONTRAST CT CERVICAL SPINE WITHOUT CONTRAST TECHNIQUE: Multidetector CT imaging of the head and cervical spine was performed following the standard protocol without intravenous contrast. Multiplanar CT image reconstructions of the cervical spine were also generated. RADIATION DOSE REDUCTION: This exam was performed according to the departmental  dose-optimization program which includes automated exposure control, adjustment of the mA and/or kV according to patient size and/or use of iterative reconstruction technique. COMPARISON:  None Available. FINDINGS: CT HEAD FINDINGS Brain: There is no acute intracranial hemorrhage, extra-axial fluid collection, or acute infarct Parenchymal volume is within expected limits for age. The ventricles are normal in size. Gray-white differentiation is preserved The pituitary and suprasellar region are normal. There is no mass lesion there is no mass effect or midline shift. Vascular: There is calcification of the bilateral carotid siphons. Skull: Normal. Negative for fracture or focal lesion. Sinuses/Orbits: The paranasal sinuses are  clear. Bilateral lens implants are in place. The globes and orbits are otherwise unremarkable. Other: The mastoid air cells and middle ear cavities are clear. CT CERVICAL SPINE FINDINGS Alignment: There is no significant antero or retrolisthesis. There is no jumped or perched facet or other evidence of traumatic malalignment. Skull base and vertebrae: Skull base alignment is maintained. Vertebral body heights are preserved there is no evidence of acute fracture. There is no suspicious osseous lesion. Soft tissues and spinal canal: No prevertebral fluid or swelling. No visible canal hematoma. Disc levels: There is bulky degenerative pannus about the dens with mild narrowing of the craniocervical junction. There is moderate disc space narrowing and degenerative endplate change at C5-C6 and bulky multilevel facet arthropathy. There is no evidence of high-grade spinal canal stenosis. Upper chest: The imaged lung apices are clear. Other: None. IMPRESSION: 1. No acute intracranial pathology. 2. No acute fracture or traumatic malalignment of the cervical spine. Electronically Signed   By: Lesia Hausen M.D.   On: 02/10/2023 15:33   CT Cervical Spine Wo Contrast  Result Date: 02/10/2023 CLINICAL  DATA:  Fall, head and neck pain. EXAM: CT HEAD WITHOUT CONTRAST CT CERVICAL SPINE WITHOUT CONTRAST TECHNIQUE: Multidetector CT imaging of the head and cervical spine was performed following the standard protocol without intravenous contrast. Multiplanar CT image reconstructions of the cervical spine were also generated. RADIATION DOSE REDUCTION: This exam was performed according to the departmental dose-optimization program which includes automated exposure control, adjustment of the mA and/or kV according to patient size and/or use of iterative reconstruction technique. COMPARISON:  None Available. FINDINGS: CT HEAD FINDINGS Brain: There is no acute intracranial hemorrhage, extra-axial fluid collection, or acute infarct Parenchymal volume is within expected limits for age. The ventricles are normal in size. Gray-white differentiation is preserved The pituitary and suprasellar region are normal. There is no mass lesion there is no mass effect or midline shift. Vascular: There is calcification of the bilateral carotid siphons. Skull: Normal. Negative for fracture or focal lesion. Sinuses/Orbits: The paranasal sinuses are clear. Bilateral lens implants are in place. The globes and orbits are otherwise unremarkable. Other: The mastoid air cells and middle ear cavities are clear. CT CERVICAL SPINE FINDINGS Alignment: There is no significant antero or retrolisthesis. There is no jumped or perched facet or other evidence of traumatic malalignment. Skull base and vertebrae: Skull base alignment is maintained. Vertebral body heights are preserved there is no evidence of acute fracture. There is no suspicious osseous lesion. Soft tissues and spinal canal: No prevertebral fluid or swelling. No visible canal hematoma. Disc levels: There is bulky degenerative pannus about the dens with mild narrowing of the craniocervical junction. There is moderate disc space narrowing and degenerative endplate change at C5-C6 and bulky  multilevel facet arthropathy. There is no evidence of high-grade spinal canal stenosis. Upper chest: The imaged lung apices are clear. Other: None. IMPRESSION: 1. No acute intracranial pathology. 2. No acute fracture or traumatic malalignment of the cervical spine. Electronically Signed   By: Lesia Hausen M.D.   On: 02/10/2023 15:33   DG Chest 1 View  Result Date: 02/10/2023 CLINICAL DATA:  Preop.  Hip fracture EXAM: CHEST  1 VIEW portable supine COMPARISON:  02/06/2017 FINDINGS: Film is rotated to the left. Calcified and tortuous aorta. Prominent calcifications along the tracheobronchial tree. Stable cardiopericardial silhouette. Interstitial lung changes. Apical pleural thickening. No consolidation, pneumothorax or effusion. Overlapping cardiac leads. Degenerative changes of the spine with osteopenia IMPRESSION: Rotated radiograph. Chronic changes. No  acute cardiopulmonary disease Electronically Signed   By: Karen Kays M.D.   On: 02/10/2023 15:04   DG Hip Unilat With Pelvis 2-3 Views Left  Result Date: 02/10/2023 CLINICAL DATA:  Pain after fall EXAM: DG HIP (WITH OR WITHOUT PELVIS) 3V LEFT COMPARISON:  None Available. FINDINGS: Comminuted intertrochanteric hip fracture with foreshortening and displaced fragments. Severe osteopenia. Mild joint space loss of the hips and sacroiliac joints. Hyperostosis. Moderate degenerative changes of the spine at the edge of the imaging field. Diffuse colonic stool. IMPRESSION: Comminuted foreshortened, displaced and angulated intertrochanteric left hip fracture. Osteopenia with degenerative changes. Electronically Signed   By: Karen Kays M.D.   On: 02/10/2023 15:03      Labs: BNP (last 3 results) No results for input(s): "BNP" in the last 8760 hours. Basic Metabolic Panel: Recent Labs  Lab 02/11/23 1249 02/12/23 0418 02/13/23 0624 02/14/23 0718 02/15/23 0539  NA 138 136 137 135 133*  K 4.3 4.7 4.4 4.5 5.1  CL 107 105 106 103 104  CO2 24 22 23 26 23    GLUCOSE 212* 200* 168* 142* 125*  BUN 30* 45* 41* 31* 27*  CREATININE 0.95 1.55* 0.93 0.77 0.72  CALCIUM 8.5* 8.1* 8.4* 8.3* 8.2*   Liver Function Tests: No results for input(s): "AST", "ALT", "ALKPHOS", "BILITOT", "PROT", "ALBUMIN" in the last 168 hours. No results for input(s): "LIPASE", "AMYLASE" in the last 168 hours. No results for input(s): "AMMONIA" in the last 168 hours. CBC: Recent Labs  Lab 02/10/23 1333 02/11/23 1249 02/11/23 2116 02/12/23 0418 02/12/23 1154 02/13/23 0624 02/14/23 0718 02/15/23 0650  WBC 5.7 13.3*  --  11.4*  --  9.2 8.5 8.3  NEUTROABS 4.1  --   --  9.6*  --  6.6 6.4  --   HGB 12.3 9.3*   < > 8.2* 7.8* 8.6* 8.3* 8.1*  HCT 36.7 28.8*   < > 24.3* 23.7* 24.9* 25.9* 25.1*  MCV 96.1 97.0  --  93.1  --  92.6 97.4 98.0  PLT 190 193  --  134*  --  137* 126* 162   < > = values in this interval not displayed.   Cardiac Enzymes: No results for input(s): "CKTOTAL", "CKMB", "CKMBINDEX", "TROPONINI" in the last 168 hours. BNP: Invalid input(s): "POCBNP" CBG: Recent Labs  Lab 02/14/23 1159 02/14/23 1716 02/14/23 2234 02/15/23 0808 02/15/23 1118  GLUCAP 161* 140* 125* 142* 158*   D-Dimer No results for input(s): "DDIMER" in the last 72 hours. Hgb A1c No results for input(s): "HGBA1C" in the last 72 hours. Lipid Profile No results for input(s): "CHOL", "HDL", "LDLCALC", "TRIG", "CHOLHDL", "LDLDIRECT" in the last 72 hours. Thyroid function studies No results for input(s): "TSH", "T4TOTAL", "T3FREE", "THYROIDAB" in the last 72 hours.  Invalid input(s): "FREET3" Anemia work up No results for input(s): "VITAMINB12", "FOLATE", "FERRITIN", "TIBC", "IRON", "RETICCTPCT" in the last 72 hours. Urinalysis    Component Value Date/Time   COLORURINE AMBER (A) 02/04/2017 1145   APPEARANCEUR CLEAR (A) 02/04/2017 1145   APPEARANCEUR Turbid 06/18/2012 0900   LABSPEC 1.017 02/04/2017 1145   LABSPEC 1.020 06/18/2012 0900   PHURINE 5.0 02/04/2017 1145   GLUCOSEU  NEGATIVE 02/04/2017 1145   GLUCOSEU Negative 06/18/2012 0900   HGBUR NEGATIVE 02/04/2017 1145   BILIRUBINUR NEGATIVE 02/04/2017 1145   BILIRUBINUR Negative 06/18/2012 0900   KETONESUR NEGATIVE 02/04/2017 1145   PROTEINUR NEGATIVE 02/04/2017 1145   NITRITE NEGATIVE 02/04/2017 1145   LEUKOCYTESUR NEGATIVE 02/04/2017 1145   LEUKOCYTESUR Trace 06/18/2012 0900  Sepsis Labs Recent Labs  Lab 02/12/23 0418 02/13/23 0624 02/14/23 0718 02/15/23 0650  WBC 11.4* 9.2 8.5 8.3   Microbiology No results found for this or any previous visit (from the past 240 hour(s)).   Total time spend on discharging this patient, including the last patient exam, discussing the hospital stay, instructions for ongoing care as it relates to all pertinent caregivers, as well as preparing the medical discharge records, prescriptions, and/or referrals as applicable, is 35 minutes.    Darlin Priestly, MD  [facility] 02/15/2023, 12:19 PM

## 2023-02-15 NOTE — Plan of Care (Signed)
  Problem: Coping: Goal: Ability to adjust to condition or change in health will improve Outcome: Progressing   Problem: Metabolic: Goal: Ability to maintain appropriate glucose levels will improve Outcome: Progressing   Problem: Nutritional: Goal: Maintenance of adequate nutrition will improve Outcome: Progressing   Problem: Tissue Perfusion: Goal: Adequacy of tissue perfusion will improve Outcome: Progressing   Problem: Clinical Measurements: Goal: Ability to maintain clinical measurements within normal limits will improve Outcome: Progressing

## 2023-02-15 NOTE — TOC Progression Note (Signed)
Transition of Care [facility]) - Progression Note    Patient Details  Name: Cheryl Rios MRN: [id number] Date of Birth: 1926-01-26  Transition of Care [facility]) CM/SW Contact  Marlowe Sax, RN Phone Number: 02/15/2023, 1:27 PM  Clinical Narrative:    The patient is going to [facility], patient's family in the room and is aware She is next on EMS list   Expected Discharge Plan: Skilled Nursing Facility Barriers to Discharge: Barriers Resolved  Expected Discharge Plan and Services       Living arrangements for the past 2 months: Single Family Home Expected Discharge Date: 02/15/23                                     Social Determinants of Health (SDOH) Interventions SDOH Screenings   Food Insecurity: No Food Insecurity (01/19/2023)   Received from [facility]  Transportation Needs: No Transportation Needs (01/19/2023)   Received from [facility]  Utilities: Not At Risk (01/19/2023)   Received from [facility]  Financial Resource Strain: Low Risk  (01/19/2023)   Received from [facility]  Tobacco Use: Low Risk  (01/19/2023)   Received from [facility]    Readmission Risk Interventions     No data to display

## 2023-02-15 NOTE — Progress Notes (Signed)
Physical Therapy Treatment Patient Details Name: Cheryl Rios MRN: [id number] DOB: Jul 14, 1925 Today's Date: 02/15/2023   History of Present Illness Pt is a 89 y/o female admitted after a mechanical fall in which she sustained a L hip fx s/p cephalomedullary nail.    PT Comments  Patient received in bed, she is agreeable to PT session. RN reports she received pain meds this am. Patient reports mod pain in L LE. She requires +2 total assist for bed mobility and to scoot out to edge of bed in preparation for standing. Patient is able to stand with +2 mod A and is able to take several small steps with RW and +2 mod a from bed to recliner. Patient will continue to benefit from skilled PT to improve independence and strength.    If plan is discharge home, recommend the following: Two people to help with walking and/or transfers;A lot of help with bathing/dressing/bathroom;Assistance with cooking/housework;Supervision due to cognitive status;Help with stairs or ramp for entrance;Assist for transportation;Assistance with feeding   Can travel by private vehicle     No  Equipment Recommendations  Other (comment) (TBD)    Recommendations for Other Services       Precautions / Restrictions Precautions Precautions: Fall Restrictions Weight Bearing Restrictions: Yes LLE Weight Bearing: Weight bearing as tolerated     Mobility  Bed Mobility Overal bed mobility: Needs Assistance Bed Mobility: Supine to Sit     Supine to sit: Total assist, +2 for physical assistance, HOB elevated          Transfers Overall transfer level: Needs assistance Equipment used: Rolling walker (2 wheels) Transfers: Sit to/from Stand Sit to Stand: Mod assist, +2 physical assistance   Step pivot transfers: Mod assist, +2 physical assistance, +2 safety/equipment       General transfer comment: patient able to take steps toward recliner this session with RW, cues and mod +2 assist     Ambulation/Gait Ambulation/Gait assistance: Mod assist, +2 physical assistance Gait Distance (Feet): 3 Feet Assistive device: Rolling walker (2 wheels) Gait Pattern/deviations: Step-to pattern, Decreased step length - right, Decreased step length - left, Decreased stride length Gait velocity: decreased     General Gait Details: took several steps from bed to recliner   Stairs             Wheelchair Mobility     Tilt Bed    Modified Rankin (Stroke Patients Only)       Balance Overall balance assessment: Needs assistance Sitting-balance support: No upper extremity supported, Feet unsupported Sitting balance-Leahy Scale: Good     Standing balance support: Bilateral upper extremity supported, During functional activity, Reliant on assistive device for balance Standing balance-Leahy Scale: Poor Standing balance comment: 1 lob in standing.                            Cognition Arousal: Alert Behavior During Therapy: WFL for tasks assessed/performed Overall Cognitive Status: Within Functional Limits for tasks assessed Area of Impairment: Safety/judgement, Problem solving                         Safety/Judgement: Decreased awareness of safety, Decreased awareness of deficits   Problem Solving: Difficulty sequencing, Requires verbal cues, Requires tactile cues General Comments: Pt is pleasant and cooperative throughout session        Exercises      General Comments        Pertinent  Vitals/Pain Pain Assessment Pain Assessment: Faces Faces Pain Scale: Hurts a little bit Pain Location: L hip Pain Descriptors / Indicators: Guarding, Grimacing, Aching, Tender Pain Intervention(s): Monitored during session, Repositioned, Premedicated before session    Home Living                          Prior Function            PT Goals (current goals can now be found in the care plan section) Acute Rehab PT Goals Patient Stated Goal:  decrease pain PT Goal Formulation: With patient Time For Goal Achievement: 02/26/23 Potential to Achieve Goals: Good Progress towards PT goals: Progressing toward goals    Frequency    Min 1X/week      PT Plan      Co-evaluation              AM-PAC PT "6 Clicks" Mobility   Outcome Measure  Help needed turning from your back to your side while in a flat bed without using bedrails?: Total Help needed moving from lying on your back to sitting on the side of a flat bed without using bedrails?: Total Help needed moving to and from a bed to a chair (including a wheelchair)?: A Lot Help needed standing up from a chair using your arms (e.g., wheelchair or bedside chair)?: A Lot Help needed to walk in hospital room?: A Lot Help needed climbing 3-5 steps with a railing? : Total 6 Click Score: 9    End of Session Equipment Utilized During Treatment: Gait belt Activity Tolerance: Patient limited by fatigue Patient left: in chair;with call bell/phone within reach;with chair alarm set Nurse Communication: Mobility status;Precautions;Weight bearing status PT Visit Diagnosis: Other abnormalities of gait and mobility (R26.89);Difficulty in walking, not elsewhere classified (R26.2);Unsteadiness on feet (R26.81);Pain;Muscle weakness (generalized) (M62.81);History of falling (Z91.81) Pain - Right/Left: Left Pain - part of body: Leg     Time: 0950-1002 PT Time Calculation (min) (ACUTE ONLY): 12 min  Charges:    $Gait Training: 8-22 mins PT General Charges $$ ACUTE PT VISIT: 1 Visit                     Elmyra Banwart, PT, GCS 02/15/23,11:10 AM

## 2023-02-16 DIAGNOSIS — R1312 Dysphagia, oropharyngeal phase: Secondary | ICD-10-CM | POA: Insufficient documentation

## 2023-02-24 ENCOUNTER — Ambulatory Visit: Payer: Self-pay | Admitting: Urology

## 2023-03-09 ENCOUNTER — Encounter: Payer: Self-pay | Admitting: Podiatry

## 2023-03-09 ENCOUNTER — Ambulatory Visit (INDEPENDENT_AMBULATORY_CARE_PROVIDER_SITE_OTHER): Payer: MEDICARE | Admitting: Podiatry

## 2023-03-09 DIAGNOSIS — B351 Tinea unguium: Secondary | ICD-10-CM | POA: Diagnosis not present

## 2023-03-09 DIAGNOSIS — L03032 Cellulitis of left toe: Secondary | ICD-10-CM

## 2023-03-09 DIAGNOSIS — L03031 Cellulitis of right toe: Secondary | ICD-10-CM | POA: Diagnosis not present

## 2023-03-09 DIAGNOSIS — E1149 Type 2 diabetes mellitus with other diabetic neurological complication: Secondary | ICD-10-CM | POA: Diagnosis not present

## 2023-03-09 DIAGNOSIS — M79676 Pain in unspecified toe(s): Secondary | ICD-10-CM | POA: Diagnosis not present

## 2023-03-14 NOTE — Progress Notes (Signed)
Subjective:  Patient ID: Cheryl Rios, female    DOB: Aug 19, 1925,  MRN: [id number]  Cheryl Rios presents to clinic today for at risk foot care with history of diabetic neuropathy and painful thick toenails that are difficult to trim. Pain interferes with ambulation. Aggravating factors include wearing enclosed shoe gear. Pain is relieved with periodic professional debridement.   Patient accompanied by her son on today's visit. She has sustained left hip fx since our last visit. She has had hip repair and is currently in rehab facility.   I noticed patient's great toes and right 2nd toe are erythematous consistent with cellulitis. Patient and son cannot recall any episode of trauma during interval from last visit.  PCP is Jerl Mina, MD.  No Known Allergies  Review of Systems: Negative except as noted in the HPI.  Objective: No changes noted in today's physical examination. There were no vitals filed for this visit. Cheryl Rios is a pleasant 89 y.o. female frail, in NAD. AAO x 3.  Vascular Examination: Capillary refill time immediate b/l. Vascular status intact b/l with palpable pedal pulses. Pedal hair absent b/l. No pain with calf compression b/l. Skin temperature gradient WNL b/l. No cyanosis or clubbing b/l. No ischemia or gangrene noted b/l. Dependent edema noted b/l LE.  Neurological Examination: Sensation grossly intact b/l with 10 gram monofilament. Vibratory sensation intact b/l. Pt has subjective symptoms of neuropathy.  Dermatological Examination: Pedal skin with normal turgor, texture and tone b/l.  No open wounds. No interdigital macerations.   Toenails 1-5 b/l thick, discolored, elongated with subungual debris and pain on dorsal palpation.   Resolved porokeratotic lesion(s) submet head 5 b/l due to wheel chair use. No erythema, no edema, no drainage, no fluctuance.   Assessment/Plan: 1. Pain due to onychomycosis of toenail   2. Cellulitis of second  toe, left   3. Cellulitis of great toe of right foot   4. Cellulitis of great toe of left foot   5. Type II diabetes mellitus with neurological manifestations [facility])    -Patient's family member present. All questions/concerns addressed on today's visit. -Consent given for treatment as described below: -Examined patient. -Discussed cellulitis of toes and need for oral antibiotics. Order written for doxycycline 100 mg po bid x 10 days. Follow up with Dr. Logan Bores in 2-3 weeks for re-evaluation. -Patient to continue soft, supportive shoe gear daily. -Toenails 1-5 b/l were debrided in length and girth with sterile nail nippers and dremel without iatrogenic bleeding.  -Patient/POA to call should there be question/concern in the interim.   Return in about 3 months (around 06/09/2023).  Freddie Breech, DPM

## 2023-03-22 ENCOUNTER — Ambulatory Visit: Payer: Self-pay | Admitting: Urology

## 2023-04-04 ENCOUNTER — Ambulatory Visit: Payer: MEDICARE | Admitting: Podiatry

## 2023-04-24 ENCOUNTER — Other Ambulatory Visit: Payer: Self-pay | Admitting: Student

## 2023-04-24 DIAGNOSIS — M7989 Other specified soft tissue disorders: Secondary | ICD-10-CM

## 2023-04-25 ENCOUNTER — Emergency Department: Payer: MEDICARE

## 2023-04-25 ENCOUNTER — Ambulatory Visit
Admission: RE | Admit: 2023-04-25 | Discharge: 2023-04-25 | Disposition: A | Discharge status: Home / Self Care | Payer: MEDICARE | Source: Ambulatory Visit | Attending: Student | Admitting: Student

## 2023-04-25 ENCOUNTER — Inpatient Hospital Stay
Admission: EM | Admit: 2023-04-25 | Discharge: 2023-05-02 | DRG: 270 | Disposition: A | Discharge status: Hospice | Payer: MEDICARE | Attending: Hospitalist | Admitting: Hospitalist

## 2023-04-25 DIAGNOSIS — I871 Compression of vein: Secondary | ICD-10-CM | POA: Diagnosis present

## 2023-04-25 DIAGNOSIS — M549 Dorsalgia, unspecified: Secondary | ICD-10-CM | POA: Diagnosis present

## 2023-04-25 DIAGNOSIS — L89102 Pressure ulcer of unspecified part of back, stage 2: Secondary | ICD-10-CM | POA: Diagnosis present

## 2023-04-25 DIAGNOSIS — Z79899 Other long term (current) drug therapy: Secondary | ICD-10-CM

## 2023-04-25 DIAGNOSIS — I82220 Acute embolism and thrombosis of inferior vena cava: Secondary | ICD-10-CM | POA: Diagnosis present

## 2023-04-25 DIAGNOSIS — I959 Hypotension, unspecified: Secondary | ICD-10-CM | POA: Diagnosis not present

## 2023-04-25 DIAGNOSIS — L89313 Pressure ulcer of right buttock, stage 3: Secondary | ICD-10-CM | POA: Diagnosis present

## 2023-04-25 DIAGNOSIS — E119 Type 2 diabetes mellitus without complications: Secondary | ICD-10-CM

## 2023-04-25 DIAGNOSIS — E785 Hyperlipidemia, unspecified: Secondary | ICD-10-CM | POA: Diagnosis present

## 2023-04-25 DIAGNOSIS — E039 Hypothyroidism, unspecified: Secondary | ICD-10-CM | POA: Diagnosis present

## 2023-04-25 DIAGNOSIS — I2699 Other pulmonary embolism without acute cor pulmonale: Secondary | ICD-10-CM | POA: Insufficient documentation

## 2023-04-25 DIAGNOSIS — G4733 Obstructive sleep apnea (adult) (pediatric): Secondary | ICD-10-CM | POA: Diagnosis present

## 2023-04-25 DIAGNOSIS — N1831 Chronic kidney disease, stage 3a: Secondary | ICD-10-CM | POA: Diagnosis present

## 2023-04-25 DIAGNOSIS — I82412 Acute embolism and thrombosis of left femoral vein: Secondary | ICD-10-CM | POA: Diagnosis not present

## 2023-04-25 DIAGNOSIS — I2510 Atherosclerotic heart disease of native coronary artery without angina pectoris: Secondary | ICD-10-CM | POA: Diagnosis present

## 2023-04-25 DIAGNOSIS — I82432 Acute embolism and thrombosis of left popliteal vein: Secondary | ICD-10-CM | POA: Diagnosis present

## 2023-04-25 DIAGNOSIS — Z79890 Hormone replacement therapy (postmenopausal): Secondary | ICD-10-CM | POA: Diagnosis not present

## 2023-04-25 DIAGNOSIS — Z66 Do not resuscitate: Secondary | ICD-10-CM | POA: Diagnosis present

## 2023-04-25 DIAGNOSIS — Z9841 Cataract extraction status, right eye: Secondary | ICD-10-CM

## 2023-04-25 DIAGNOSIS — Z9842 Cataract extraction status, left eye: Secondary | ICD-10-CM

## 2023-04-25 DIAGNOSIS — Z7901 Long term (current) use of anticoagulants: Secondary | ICD-10-CM | POA: Diagnosis not present

## 2023-04-25 DIAGNOSIS — I129 Hypertensive chronic kidney disease with stage 1 through stage 4 chronic kidney disease, or unspecified chronic kidney disease: Secondary | ICD-10-CM | POA: Diagnosis present

## 2023-04-25 DIAGNOSIS — L89223 Pressure ulcer of left hip, stage 3: Secondary | ICD-10-CM | POA: Diagnosis present

## 2023-04-25 DIAGNOSIS — I2693 Single subsegmental pulmonary embolism without acute cor pulmonale: Secondary | ICD-10-CM | POA: Diagnosis present

## 2023-04-25 DIAGNOSIS — I82403 Acute embolism and thrombosis of unspecified deep veins of lower extremity, bilateral: Secondary | ICD-10-CM

## 2023-04-25 DIAGNOSIS — R0902 Hypoxemia: Secondary | ICD-10-CM | POA: Diagnosis present

## 2023-04-25 DIAGNOSIS — Z96653 Presence of artificial knee joint, bilateral: Secondary | ICD-10-CM | POA: Diagnosis present

## 2023-04-25 DIAGNOSIS — I82413 Acute embolism and thrombosis of femoral vein, bilateral: Secondary | ICD-10-CM | POA: Diagnosis present

## 2023-04-25 DIAGNOSIS — E1122 Type 2 diabetes mellitus with diabetic chronic kidney disease: Secondary | ICD-10-CM | POA: Diagnosis present

## 2023-04-25 DIAGNOSIS — E1142 Type 2 diabetes mellitus with diabetic polyneuropathy: Secondary | ICD-10-CM | POA: Diagnosis present

## 2023-04-25 DIAGNOSIS — M810 Age-related osteoporosis without current pathological fracture: Secondary | ICD-10-CM | POA: Diagnosis present

## 2023-04-25 DIAGNOSIS — E871 Hypo-osmolality and hyponatremia: Secondary | ICD-10-CM | POA: Diagnosis present

## 2023-04-25 DIAGNOSIS — M7989 Other specified soft tissue disorders: Secondary | ICD-10-CM | POA: Insufficient documentation

## 2023-04-25 DIAGNOSIS — M1990 Unspecified osteoarthritis, unspecified site: Secondary | ICD-10-CM | POA: Diagnosis present

## 2023-04-25 DIAGNOSIS — I10 Essential (primary) hypertension: Secondary | ICD-10-CM | POA: Diagnosis present

## 2023-04-25 DIAGNOSIS — Z961 Presence of intraocular lens: Secondary | ICD-10-CM | POA: Diagnosis present

## 2023-04-25 DIAGNOSIS — I82422 Acute embolism and thrombosis of left iliac vein: Secondary | ICD-10-CM | POA: Diagnosis not present

## 2023-04-25 DIAGNOSIS — L8990 Pressure ulcer of unspecified site, unspecified stage: Secondary | ICD-10-CM | POA: Insufficient documentation

## 2023-04-25 DIAGNOSIS — Z90710 Acquired absence of both cervix and uterus: Secondary | ICD-10-CM

## 2023-04-25 DIAGNOSIS — Z9049 Acquired absence of other specified parts of digestive tract: Secondary | ICD-10-CM

## 2023-04-25 DIAGNOSIS — R0781 Pleurodynia: Secondary | ICD-10-CM | POA: Diagnosis not present

## 2023-04-25 LAB — CBC WITH DIFFERENTIAL/PLATELET
Abs Immature Granulocytes: 0.09 10*3/uL — ABNORMAL HIGH (ref 0.00–0.07)
Basophils Absolute: 0 10*3/uL (ref 0.0–0.1)
Basophils Relative: 1 %
Eosinophils Absolute: 0.2 10*3/uL (ref 0.0–0.5)
Eosinophils Relative: 4 %
HCT: 41 % (ref 36.0–46.0)
Hemoglobin: 13 g/dL (ref 12.0–15.0)
Immature Granulocytes: 1 %
Lymphocytes Relative: 22 %
Lymphs Abs: 1.5 10*3/uL (ref 0.7–4.0)
MCH: 30.2 pg (ref 26.0–34.0)
MCHC: 31.7 g/dL (ref 30.0–36.0)
MCV: 95.3 fL (ref 80.0–100.0)
Monocytes Absolute: 0.6 10*3/uL (ref 0.1–1.0)
Monocytes Relative: 9 %
Neutro Abs: 4.1 10*3/uL (ref 1.7–7.7)
Neutrophils Relative %: 63 %
Platelets: 254 10*3/uL (ref 150–400)
RBC: 4.3 MIL/uL (ref 3.87–5.11)
RDW: 19 % — ABNORMAL HIGH (ref 11.5–15.5)
WBC: 6.6 10*3/uL (ref 4.0–10.5)
nRBC: 0 % (ref 0.0–0.2)

## 2023-04-25 LAB — PROTIME-INR
INR: 1 (ref 0.8–1.2)
Prothrombin Time: 12.8 s (ref 11.4–15.2)

## 2023-04-25 LAB — COMPREHENSIVE METABOLIC PANEL
ALT: 25 U/L (ref 0–44)
AST: 31 U/L (ref 15–41)
Albumin: 3.7 g/dL (ref 3.5–5.0)
Alkaline Phosphatase: 108 U/L (ref 38–126)
Anion gap: 12 (ref 5–15)
BUN: 26 mg/dL — ABNORMAL HIGH (ref 8–23)
CO2: 27 mmol/L (ref 22–32)
Calcium: 9.9 mg/dL (ref 8.9–10.3)
Chloride: 96 mmol/L — ABNORMAL LOW (ref 98–111)
Creatinine, Ser: 0.97 mg/dL (ref 0.44–1.00)
GFR, Estimated: 54 mL/min — ABNORMAL LOW (ref >60)
Glucose, Bld: 168 mg/dL — ABNORMAL HIGH (ref 70–99)
Potassium: 5 mmol/L (ref 3.5–5.1)
Sodium: 135 mmol/L (ref 135–145)
Total Bilirubin: 0.6 mg/dL (ref <1.2)
Total Protein: 7.5 g/dL (ref 6.5–8.1)

## 2023-04-25 LAB — APTT: aPTT: 28 s (ref 24–36)

## 2023-04-25 LAB — TROPONIN I (HIGH SENSITIVITY): Troponin I (High Sensitivity): 7 ng/L (ref <18)

## 2023-04-25 MED ORDER — ALLOPURINOL 300 MG PO TABS
150.0000 mg | ORAL_TABLET | Freq: Every day | ORAL | Status: DC
  Administered 2023-04-26 – 2023-05-02 (×6): 150 mg via ORAL
  Filled 2023-04-25 (×7): qty 1

## 2023-04-25 MED ORDER — OXYCODONE HCL 5 MG PO TABS
5.0000 mg | ORAL_TABLET | Freq: Four times a day (QID) | ORAL | Status: DC | PRN
  Administered 2023-04-26 – 2023-04-27 (×4): 5 mg via ORAL
  Filled 2023-04-25 (×4): qty 1

## 2023-04-25 MED ORDER — GABAPENTIN 100 MG PO CAPS
200.0000 mg | ORAL_CAPSULE | Freq: Three times a day (TID) | ORAL | Status: DC
  Administered 2023-04-25 – 2023-04-30 (×16): 200 mg via ORAL
  Filled 2023-04-25 (×17): qty 2

## 2023-04-25 MED ORDER — HEPARIN BOLUS VIA INFUSION
4000.0000 [IU] | Freq: Once | INTRAVENOUS | Status: AC
  Administered 2023-04-25: 4000 [IU] via INTRAVENOUS
  Filled 2023-04-25: qty 4000

## 2023-04-25 MED ORDER — SODIUM CHLORIDE 0.9% FLUSH
3.0000 mL | Freq: Two times a day (BID) | INTRAVENOUS | Status: DC
  Administered 2023-04-26 – 2023-05-02 (×10): 3 mL via INTRAVENOUS

## 2023-04-25 MED ORDER — FOLIC ACID 1 MG PO TABS
1.0000 mg | ORAL_TABLET | Freq: Every day | ORAL | Status: DC
  Administered 2023-04-26 – 2023-05-02 (×6): 1 mg via ORAL
  Filled 2023-04-25 (×7): qty 1

## 2023-04-25 MED ORDER — ONDANSETRON HCL 4 MG/2ML IJ SOLN: 4.0000 mg | Freq: Four times a day (QID) | INTRAMUSCULAR | Status: DC | PRN

## 2023-04-25 MED ORDER — IOHEXOL 350 MG/ML SOLN
125.0000 mL | Freq: Once | INTRAVENOUS | Status: AC | PRN
  Administered 2023-04-25: 125 mL via INTRAVENOUS

## 2023-04-25 MED ORDER — POLYETHYLENE GLYCOL 3350 17 G PO PACK: 17.0000 g | PACK | Freq: Every day | ORAL | Status: DC | PRN

## 2023-04-25 MED ORDER — ACETAMINOPHEN 650 MG RE SUPP: 650.0000 mg | Freq: Four times a day (QID) | RECTAL | Status: DC | PRN

## 2023-04-25 MED ORDER — DULOXETINE HCL 20 MG PO CPEP
40.0000 mg | ORAL_CAPSULE | Freq: Every day | ORAL | Status: DC
  Administered 2023-04-26 – 2023-05-02 (×6): 40 mg via ORAL
  Filled 2023-04-25 (×7): qty 2

## 2023-04-25 MED ORDER — ACETAMINOPHEN 325 MG PO TABS
650.0000 mg | ORAL_TABLET | Freq: Four times a day (QID) | ORAL | Status: DC | PRN
Start: 2023-04-25 — End: 2023-05-02
  Administered 2023-04-28: 650 mg via ORAL
  Filled 2023-04-25: qty 2

## 2023-04-25 MED ORDER — ONDANSETRON HCL 4 MG PO TABS: 4.0000 mg | ORAL_TABLET | Freq: Four times a day (QID) | ORAL | Status: DC | PRN

## 2023-04-25 MED ORDER — INSULIN ASPART 100 UNIT/ML IJ SOLN: 0.0000 [IU] | Freq: Three times a day (TID) | INTRAMUSCULAR | Status: DC

## 2023-04-25 MED ORDER — LEVOTHYROXINE SODIUM 50 MCG PO TABS
75.0000 ug | ORAL_TABLET | Freq: Every day | ORAL | Status: DC
  Administered 2023-04-26 – 2023-05-02 (×7): 75 ug via ORAL
  Filled 2023-04-25 (×7): qty 1

## 2023-04-25 MED ORDER — HEPARIN (PORCINE) 25000 UT/250ML-% IV SOLN
1150.0000 [IU]/h | INTRAVENOUS | Status: DC
  Administered 2023-04-25 – 2023-04-28 (×3): 1050 [IU]/h via INTRAVENOUS
  Filled 2023-04-25 (×3): qty 250

## 2023-04-25 NOTE — Consult Note (Signed)
PHARMACY - ANTICOAGULATION CONSULT NOTE  Pharmacy Consult for heparin Indication: DVT  No Known Allergies  Patient Measurements: Height: 5' 1.5" (156.2 cm) Weight: 83 kg (182 lb 15.7 oz) IBW/kg (Calculated) : 48.95 Heparin Dosing Weight: 67.7 kg  Vital Signs: Temp: 98.2 F (36.8 C) (12/03 1707) Temp Source: Oral (12/03 1707) BP: 131/66 (12/03 1707) Pulse Rate: 95 (12/03 1707)  Labs: Recent Labs    04/25/23 1738  HGB 13.0  HCT 41.0  PLT 254  CREATININE 0.97    Estimated Creatinine Clearance: 34.3 mL/min (by C-G formula based on SCr of 0.97 mg/dL).   Medical History: Past Medical History:  Diagnosis Date   Arrhythmia    Arthritis    Diabetes mellitus without complication (HCC)    Diverticulosis    Dysrhythmia    Edema of both feet    HOH (hard of hearing)    Hypertension    Hypothyroidism    Neuropathy    Osteoporosis    Sleep apnea    OSA--Use C-PAP   Thyroid disease    Baseline labs: No baseline labs   Medications:  Per chart review no PTA anticoagulation meds identified Previously filled apixaban for 3 day supply  Assessment: Cheryl Rios is a 89 yo f who presented to the DI due to a large blood clot in her L left via venous US. Pharmacy was consulted to manage and dose heparin.   Goal of Therapy:  Heparin level 0.3-0.7 units/ml Monitor platelets by anticoagulation protocol: Yes   Plan:  Give 4000 units bolus x 1 Start heparin infusion at 1050 units/hr Check anti-Xa level in 8 hours and daily while on heparin Continue to monitor H&H and platelets  Effie Shy, PharmD Pharmacy Resident  04/25/2023 6:50 PM

## 2023-04-25 NOTE — H&P (Signed)
History and Physical    Patient: Cheryl Rios NWG:[id number] DOB: May 26, 1925 DOA: 04/25/2023 DOS: the patient was seen and examined on 04/25/2023 PCP: Jerl Mina, MD  Patient coming from: Home  Chief Complaint:  Chief Complaint  Patient presents with   DVT   HPI: Cheryl Rios is a 89 y.o. female with medical history significant of CAD, CKD stage IIIa, hypertension, hypothyroidism, OSA on CPAP, osteoporosis, type 2 diabetes who presents to the ED due to A-T.  [name] states that she has been experiencing left lower extremity swelling for quite some time now.  She went to her PCP and was given antibiotics approximately 2 weeks ago without any improvement.  She endorses redness of her left leg but denies any pain, numbness or tingling.  She denies any chest pain, palpitations or shortness of breath.  Her brother at bedside states that she has been improving how much she can ambulate after coming home from rehab but is still only able to take a few steps using a walker before she has to rest.  Due to this, she does have a pressure ulcer that they have been working on improving.  ED course: On arrival to the ED, patient was normotensive at 131/66 with heart rate of 95.  She was saturating at 97% on room air.  She was afebrile at 98.2.  Initial workup notable for normal CBC, and CMP with glucose of 168, BUN 26, creatinine 0.97 and GFR 54.  CT venogram was obtained that demonstrated large occlusive thrombus extending from the left femoral vein just above the knee to the infrarenal IVC, nonocclusive thrombus of the right common femoral vein, right lower lobe segmental PE without right heart strain.  Vascular surgery consulted and patient started on heparin.  TRH contacted for admission.  Review of Systems: As mentioned in the history of present illness. All other systems reviewed and are negative.  Past Medical History:  Diagnosis Date   Arrhythmia    Arthritis    Diabetes mellitus  without complication (HCC)    Diverticulosis    Dysrhythmia    Edema of both feet    HOH (hard of hearing)    Hypertension    Hypothyroidism    Neuropathy    Osteoporosis    Sleep apnea    OSA--Use C-PAP   Thyroid disease    Past Surgical History:  Procedure Laterality Date   ABDOMINAL HYSTERECTOMY     CATARACT EXTRACTION W/PHACO Right 07/19/2016   Procedure: CATARACT EXTRACTION PHACO AND INTRAOCULAR LENS PLACEMENT (IOC);  Surgeon: Lockie Mola, MD;  Location: [facility] ORS;  Service: Ophthalmology;  Laterality: Right;  [place] 0:55.8 AP% 14.3 CDE 9.52 Fluid pack lot # [id number]   CATARACT EXTRACTION W/PHACO Left 08/18/2016   Procedure: CATARACT EXTRACTION PHACO AND INTRAOCULAR LENS PLACEMENT (IOC);  Surgeon: Lockie Mola, MD;  Location: [facility] ORS;  Service: Ophthalmology;  Laterality: Left;  US1:03.1 AP%20.6 CDE13.2 Fluid pack lot # [id number]   CHOLECYSTECTOMY     ERCP N/A 02/07/2017   Procedure: ENDOSCOPIC RETROGRADE CHOLANGIOPANCREATOGRAPHY (ERCP);  Surgeon: Midge Minium, MD;  Location: [facility] ENDOSCOPY;  Service: Endoscopy;  Laterality: N/A;   FEMUR IM NAIL Left 02/11/2023   Procedure: INTRAMEDULLARY (IM) NAIL FEMORAL;  Surgeon: Campbell Stall, MD;  Location: [facility] ORS;  Service: Orthopedics;  Laterality: Left;   GALLBLADDER SURGERY     HEMORRHOID SURGERY     JOINT REPLACEMENT Bilateral    KNEE REPLACEMENT   KNEE SURGERY Right    Social History:  reports that she has never smoked. She has never used smokeless tobacco. She reports that she does not drink alcohol and does not use drugs.  No Known Allergies  Family History  Problem Relation Age of Onset   Breast cancer Neg Hx     Prior to Admission medications   Medication Sig Start Date End Date Taking? Authorizing Provider  ACCU-CHEK SOFTCLIX LANCETS lancets USE 1 EACH ONCE DAILY. USE AS INSTRUCTED. 05/31/18   [provider]  acetaminophen (TYLENOL) 500 MG tablet Take 2 tablets (1,000 mg total) by mouth  every 8 (eight) hours as needed for mild pain, fever or headache. 02/15/23   Darlin Priestly, MD  allopurinol (ZYLOPRIM) 100 MG tablet Take 1.5 tablets by mouth daily. 12/14/20   [provider]  ammonium lactate (AMLACTIN) 12 % cream Apply 1 Application topically as needed for dry skin. 08/29/22   McDonald, Rachelle Hora, DPM  DULoxetine (CYMBALTA) 20 MG capsule Take 40 mg by mouth daily. 12/21/22   [provider]  feeding supplement, GLUCERNA SHAKE, (GLUCERNA SHAKE) LIQD Take 237 mLs by mouth 3 (three) times daily between meals. 02/15/23   Darlin Priestly, MD  folic acid (FOLVITE) 1 MG tablet Take 1 mg by mouth daily. 11/03/22   [provider]  furosemide (LASIX) 40 MG tablet Take 40 mg by mouth daily. In am.    [provider]  gabapentin (NEURONTIN) 100 MG capsule Take 200 mg by mouth 3 (three) times daily.    [provider]  glucose blood test strip TEST BLOOD SUGAR ONCE DAILY 05/29/18   [provider]  ketoconazole (NIZORAL) 2 % cream Apply 1 Application topically daily. 12/05/22   Freddie Breech, DPM  levothyroxine (SYNTHROID, LEVOTHROID) 75 MCG tablet TAKE 1 TABLET BY MOUTH ONCE DAILY ON EMPTY STOMACH WITH WATER 30-60 MIN BEFORE BREAKFAST 05/18/18   [provider]  lisinopril (PRINIVIL,ZESTRIL) 20 MG tablet TAKE 1 TABLET (20 MG TOTAL) BY MOUTH ONCE DAILY. 05/18/18   [provider]  Multiple Vitamin (MULTIVITAMIN WITH MINERALS) TABS tablet Take 1 tablet by mouth daily. 02/16/23   Darlin Priestly, MD  nystatin cream (MYCOSTATIN) Apply 1 Application topically 2 (two) times daily. 05/14/18   [provider]  oxyCODONE-acetaminophen (PERCOCET/ROXICET) 5-325 MG tablet Take 1 tablet by mouth every 6 (six) hours as needed for moderate pain. 02/14/23   Anson Oregon, PA-C    Physical Exam: Vitals:   04/25/23 1707  BP: 131/66  Pulse: 95  Resp: 18  Temp: 98.2 F (36.8 C)  TempSrc: Oral  SpO2: 97%  Weight: 83 kg  Height: 5' 1.5"  (1.562 m)   Physical Exam Vitals and nursing note reviewed.  Constitutional:      General: She is not in acute distress.    Appearance: She is obese.  HENT:     Mouth/Throat:     Mouth: Mucous membranes are dry.  Cardiovascular:     Rate and Rhythm: Normal rate and regular rhythm.     Heart sounds: No murmur heard. Pulmonary:     Effort: Pulmonary effort is normal. No respiratory distress.     Breath sounds: Normal breath sounds. No wheezing, rhonchi or rales.  Musculoskeletal:     Comments: Bilateral pitting edema, trace on the right and +1 on the left.  Left lower extremity erythema  Skin:    General: Skin is warm and dry.  Neurological:     General: No focal deficit present.     Mental Status: She  is alert and oriented to person, place, and time. Mental status is at baseline.  Psychiatric:        Mood and Affect: Mood normal.        Behavior: Behavior normal.    Data Reviewed: CBC with WBC of 6.6, hemoglobin of 13.0, platelets of 254 CMP with sodium of 135, potassium 5.0, bicarb 27, glucose 168, BUN 26, creatinine 0.97, AST 31, ALT 25, GFR 54 INR 1.0 PTT 28  CT VENOGRAM ABD/PELVIS/LOWER EXT BILAT  Result Date: 04/25/2023 CLINICAL DATA:  DVT throughout the entire left lower extremity. EXAM: CT VENOGRAM ABDOMEN AND PELVIS AND LOWER EXTREMITY BILATERAL TECHNIQUE: Venographic phase images of the abdomen, pelvis and lower extremities were obtained following the administration of intravenous contrast. Multiplanar reformats and maximum intensity projections were generated. RADIATION DOSE REDUCTION: This exam was performed according to the departmental dose-optimization program which includes automated exposure control, adjustment of the mA and/or kV according to patient size and/or use of iterative reconstruction technique. CONTRAST:  OMNIPAQUE IOHEXOL 350 MG/ML SOLN COMPARISON:  Left lower extremity ultrasound dated 04/25/2023. FINDINGS: Lower chest: The visualized lung bases  are clear. Right lower lobe segmental pulmonary artery embolus. The pulmonary arteries are only partially visualized on this CT of the abdomen pelvis. There is however no CT evidence of right heart straining. No intra-abdominal free air or free fluid. Hepatobiliary: The liver is unremarkable. No biliary dilatation. Cholecystectomy. No retained calcified stone noted in the central CBD. 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 No active inflammatory changes. No dilatation of the main pancreatic duct or gland atrophy. Spleen: Normal in size without focal abnormality. Adrenals/Urinary Tract: The adrenal glands are unremarkable. Small right renal cyst. There is no hydronephrosis on either side. There is symmetric enhancement of the kidneys bilaterally. The visualized ureters and urinary bladder appear unremarkable. Stomach/Bowel: Severe sigmoid diverticulosis and scattered colonic diverticula without active inflammatory changes. Apparent focal area of wall thickening and irregularity of the gastric antrum with a focus of air (28/7 and coronal 39/5) likely related to respiratory motion. A gastric ulcer is less likely but not excluded. There is no bowel obstruction. The appendix is not visualized with certainty. No inflammatory changes identified in the right lower quadrant. Vascular/Lymphatic: Advanced aortoiliac atherosclerotic disease. Large occlusive thrombus within the entire left femoral and deep femoral vein extending into the infrarenal IVC with proximal tip approximately 4.5 cm distal to the level of the main renal veins. There is however an accessory left renal vein at the level of the proximal tip of the thrombus extending from the IVC to the left main renal vein. Minimal peripheral and nonocclusive thrombus noted in the right common femoral vein, age indeterminate. The SMV, splenic vein, and main portal  vein are patent. No portal venous gas. There is no adenopathy. Reproductive: Hysterectomy.  No suspicious adnexal masses. Other: Midline vertical anterior pelvic wall incisional scar. Musculoskeletal: Osteopenia with degenerative changes of the spine. Status post prior ORIF of left femoral neck comminuted fracture with nonunion at this time. Bilateral total knee arthroplasties. No acute osseous pathology. IMPRESSION: 1. Large occlusive thrombus extending from the left femoral vein just above the knee into the infrarenal IVC. The proximal tip of the clot is at the level of an accessory left renal vein. 2. Minimal peripheral and nonocclusive thrombus in the right common femoral vein, age indeterminate. 3. Right lower lobe segmental pulmonary artery  embolus. No CT evidence of right heart straining. 4. Severe sigmoid diverticulosis. No bowel obstruction. 5.  Aortic Atherosclerosis (ICD10-I70.0). These results were called by telephone at the time of interpretation on 04/25/2023 at 9:09 pm to provider [facility] , who verbally acknowledged these results. Electronically Signed   By: Elgie Collard M.D.   On: 04/25/2023 21:11   US Venous Img Lower Unilateral Left (DVT)  Result Date: 04/25/2023 CLINICAL DATA:  left leg swelling EXAM: LEFT LOWER EXTREMITY VENOUS DOPPLER ULTRASOUND TECHNIQUE: Gray-scale sonography with compression, as well as color and duplex ultrasound, were performed to evaluate the deep venous system(s) from the level of the common femoral vein through the popliteal and proximal calf veins. COMPARISON:  LEFT lower extremity venous duplex, 02/21/2022. MRI abdomen, 02/06/2017. FINDINGS: VENOUS Heterogeneously echogenic, occlusive filling defect within and incompressibility of the imaged portions of the LEFT common femoral, superficial femoral, and popliteal veins, as well as the visualized calf veins. Thrombus extension into the visualized portions of profunda femoral vein and great saphenous vein.  Limited views of the contralateral common femoral vein are unremarkable. OTHER No abnormal fluid collection. Limitations: none IMPRESSION: Acute, extensive and occlusive DVT within the imaged LEFT lower extremity The central extension of the thrombus is not visualized on this evaluation. Recommend CT venogram abdomen pelvis to evaluate/rule out pelvic versus caval extension, and consider consultation by vascular interventional specialist for potential further evaluation and management. These results will be called to the ordering clinician or representative by the Radiologist Assistant, and communication documented in the PACS or [employer]. Roanna Banning, MD [facility] Electronically Signed   By: Roanna Banning M.D.   On: 04/25/2023 15:42    There are no new results to review at this time.  Assessment and Plan:  Acute deep vein thrombosis (DVT) of both lower extremities (HCC) Lower extremity Dopplers were ordered yesterday and performed today, notable for large occlusive thrombus.  Given extensive nature, vascular surgery has been consulted. Likely 2/2 hip fracture in Sep 2024, s/p surgery with associated increased sedentary lifestyle since.  Only pain experienced at this time is back pain.  - Telemetry monitoring - Vascular surgery consulted; appreciate their recommendations - Heparin per pharmacy dosing - Tylenol and oxycodone for pain as needed  Acute pulmonary embolism (HCC) Workup regarding DVT was notable for subsegmental PE without evidence of right heart strain.  Patient is not experiencing shortness of breath, and vital signs are stable with no tachycardia or hypoxia.  - Telemetry monitoring - Troponin pending - Echocardiogram ordered - Heparin per pharmacy dosing  CKD stage 3a, GFR 45-59 ml/min (HCC) Renal function currently at baseline.  - Continue to monitor while admitted  Hypothyroidism, unspecified - Continue  home Synthroid  Type 2 diabetes mellitus without complications (HCC) Diet-controlled type 2 diabetes with last A1c of 6.2%.  No indication for SSI at this time  Hypertension - Hold home antihypertensives for now given high risk for hypotension in the setting of PE and DVT.  Restart if blood pressure remains stable  OSA on CPAP - CPAP at bedtime  Advance Care Planning:   Code Status: Limited: Do not attempt resuscitation (DNR) -DNR-LIMITED -Do Not Intubate/DNI confirmed by patient and her brother at bedside  Consults: Vascular surgery  Family Communication: Patient's brother updated at bedside  Severity of Illness: The appropriate patient status for this patient is INPATIENT. Inpatient status is judged to be reasonable and necessary in order to provide the required intensity of  service to ensure the patient's safety. The patient's presenting symptoms, physical exam findings, and initial radiographic and laboratory data in the context of their chronic comorbidities is felt to place them at high risk for further clinical deterioration. Furthermore, it is not anticipated that the patient will be medically stable for discharge from the hospital within 2 midnights of admission.   * I certify that at the point of admission it is my clinical judgment that the patient will require inpatient hospital care spanning beyond 2 midnights from the point of admission due to high intensity of service, high risk for further deterioration and high frequency of surveillance required.*  Author: Verdene Lennert, MD 04/25/2023 10:13 PM  For on call review [email or web address].

## 2023-04-25 NOTE — Assessment & Plan Note (Signed)
 CPAP at bedtime

## 2023-04-25 NOTE — Assessment & Plan Note (Signed)
-   Continue home Synthroid °

## 2023-04-25 NOTE — Assessment & Plan Note (Signed)
Renal function currently at baseline.    - Continue to monitor while admitted 

## 2023-04-25 NOTE — ED Notes (Signed)
Report given April, RN

## 2023-04-25 NOTE — Assessment & Plan Note (Signed)
-   Hold home antihypertensives for now given high risk for hypotension in the setting of PE and DVT.  Restart if blood pressure remains stable

## 2023-04-25 NOTE — ED Triage Notes (Signed)
Pt presents to the ED via POV from home with brother. Pt was sent here due to a large blood clot in her LEFT leg that was found on venous US. Pt did have blood work drawn yesterday which showed an elevated potassium. Pt denies pain. Brother (HPOA) present during triage.

## 2023-04-25 NOTE — Assessment & Plan Note (Addendum)
Lower extremity Dopplers were ordered yesterday and performed today, notable for large occlusive thrombus.  Given extensive nature, vascular surgery has been consulted. Likely 2/2 hip fracture in Sep 2024, s/p surgery with associated increased sedentary lifestyle since.  Only pain experienced at this time is back pain.  - Telemetry monitoring - Vascular surgery consulted; appreciate their recommendations - Heparin per pharmacy dosing - Tylenol and oxycodone for pain as needed

## 2023-04-25 NOTE — ED Provider Notes (Signed)
[facility] Provider Note    Event Date/Time   First MD Initiated Contact with Patient 04/25/23 1819     (approximate)   History   DVT   HPI Cheryl Rios is a 89 y.o. female with history of DM2, HTN, HLD, gout presenting today for DVT.  Patient states she has noted leg swelling for several months that has gotten worse recently.  Denied any pain in her legs except for when you are pressing on it.  Was initially seen outpatient in mid November and put on Keflex which apparently did help initially.  Follow-up visit today and had ultrasound the left lower extremity showing evidence of DVT.  Sent to the ED for further evaluation.  Denies chest pain, shortness of breath, abdominal pain.  Chart review: Patient seen by primary care provider earlier today for left leg swelling.  Outpatient ultrasound to evaluate for DVT shows extensive DVT throughout the left lower extremity involving left common femoral, superficial femoral, and popliteal veins.     Physical Exam   Triage Vital Signs: ED Triage Vitals [04/25/23 1707]  Encounter Vitals Group     BP 131/66     Systolic BP Percentile      Diastolic BP Percentile      Pulse Rate 95     Resp 18     Temp 98.2 F (36.8 C)     Temp Source Oral     SpO2 97 %     Weight 182 lb 15.7 oz (83 kg)     Height 5' 1.5" (1.562 m)     Head Circumference      Peak Flow      Pain Score 0     Pain Loc      Pain Education      Exclude from Growth Chart     Most recent vital signs: Vitals:   04/25/23 1707  BP: 131/66  Pulse: 95  Resp: 18  Temp: 98.2 F (36.8 C)  SpO2: 97%   I have reviewed the vital signs. General:  Awake, alert, no acute distress. Head:  Normocephalic, Atraumatic. EENT:  PERRL, EOMI, Oral mucosa pink and moist, Neck is supple. Cardiovascular: Regular rate, 2+ distal pulses. Respiratory:  Normal respiratory effort, symmetrical expansion, no distress.   Extremities: Marked edema, erythema, and  tenderness palpation behind the calf of left lower extremity Neuro:  Alert and oriented.  Interacting appropriately.   Skin:  Warm, dry, no rash.   Psych: Appropriate affect.    ED Results / Procedures / Treatments   Labs (all labs ordered are listed, but only abnormal results are displayed) Labs Reviewed  CBC WITH DIFFERENTIAL/PLATELET - Abnormal; Notable for the following components:      Result Value   RDW 19.0 (*)    Abs Immature Granulocytes 0.09 (*)    All other components within normal limits  COMPREHENSIVE METABOLIC PANEL - Abnormal; Notable for the following components:   Chloride 96 (*)    Glucose, Bld 168 (*)    BUN 26 (*)    GFR, Estimated 54 (*)    All other components within normal limits  PROTIME-INR  APTT  HEPARIN LEVEL (UNFRACTIONATED)  CBC WITH DIFFERENTIAL/PLATELET  BASIC METABOLIC PANEL  TROPONIN I (HIGH SENSITIVITY)     EKG    RADIOLOGY Independently interpreted CT imaging showing evidence of DVT and PE   PROCEDURES:  Critical Care performed: Yes, see critical care procedure note(s)  .Critical Care  Performed by: Anner Crete,  Donetta Potts, MD Authorized by: Janith Lima, MD   Critical care provider statement:    Critical care time (minutes):  30   Critical care was necessary to treat or prevent imminent or life-threatening deterioration of the following conditions:  Circulatory failure (PE and extensive DVT)   Critical care was time spent personally by me on the following activities:  Development of treatment plan with patient or surrogate, discussions with consultants, evaluation of patient's response to treatment, examination of patient, ordering and review of laboratory studies, ordering and review of radiographic studies, ordering and performing treatments and interventions, pulse oximetry, re-evaluation of patient's condition and review of old charts   I assumed direction of critical care for this patient from another provider in my specialty: no      Care discussed with: admitting provider      MEDICATIONS ORDERED IN ED: Medications  heparin ADULT infusion 100 units/mL (25000 units/273mL) (1,050 Units/hr Intravenous New Bag/Given 04/25/23 2013)  sodium chloride flush (NS) 0.9 % injection 3 mL (has no administration in time range)  acetaminophen (TYLENOL) tablet 650 mg (has no administration in time range)    Or  acetaminophen (TYLENOL) suppository 650 mg (has no administration in time range)  oxyCODONE (Oxy IR/ROXICODONE) immediate release tablet 5 mg (has no administration in time range)  polyethylene glycol (MIRALAX / GLYCOLAX) packet 17 g (has no administration in time range)  ondansetron (ZOFRAN) tablet 4 mg (has no administration in time range)    Or  ondansetron (ZOFRAN) injection 4 mg (has no administration in time range)  allopurinol (ZYLOPRIM) tablet 150 mg (has no administration in time range)  DULoxetine (CYMBALTA) DR capsule 40 mg (has no administration in time range)  folic acid (FOLVITE) tablet 1 mg (has no administration in time range)  gabapentin (NEURONTIN) capsule 200 mg (200 mg Oral Given 04/25/23 2233)  levothyroxine (SYNTHROID) tablet 75 mcg (has no administration in time range)  heparin bolus via infusion 4,000 Units (4,000 Units Intravenous Bolus from Bag 04/25/23 2014)  iohexol (OMNIPAQUE) 350 MG/ML injection 125 mL (125 mLs Intravenous Contrast Given 04/25/23 1951)     IMPRESSION / MDM / ASSESSMENT AND PLAN / ED COURSE  I reviewed the triage vital signs and the nursing notes.                              Differential diagnosis includes, but is not limited to, extensive occlusive DVT  Patient's presentation is most consistent with acute presentation with potential threat to life or bodily function.  Patient is a 89 year old female presenting today for outpatient ultrasound showing extensive occlusive DVT throughout the venous system of her left lower extremity.  Given significant nature and it cannot be  fully captured on ultrasound, did get follow-up CT venogram abdomen/pelvis for further evaluation.  Patient was also started on heparin at this time.  CT venogram shows extensive clot in the left lower extremity extending all the way up the infrarenal IVC with occlusion.  Also noted a right lower subsegmental PE without evidence of right heart strain.  Discussed case with vascular surgery who agrees with heparin and admission.  Patient admitted to hospitalist for further care.  The patient is on the cardiac monitor to evaluate for evidence of arrhythmia and/or significant heart rate changes.     FINAL CLINICAL IMPRESSION(S) / ED DIAGNOSES   Final diagnoses:  Acute deep vein thrombosis (DVT) of femoral vein of left lower extremity (HCC)  Pulmonary embolism on right [facility])     Rx / DC Orders   ED Discharge Orders     None        Note:  This document was prepared using Dragon voice recognition software and may include unintentional dictation errors.   Janith Lima, MD 04/25/23 [id number]

## 2023-04-25 NOTE — Assessment & Plan Note (Signed)
Workup regarding DVT was notable for subsegmental PE without evidence of right heart strain.  Patient is not experiencing shortness of breath, and vital signs are stable with no tachycardia or hypoxia.  - Telemetry monitoring - Troponin pending - Echocardiogram ordered - Heparin per pharmacy dosing

## 2023-04-25 NOTE — ED Notes (Signed)
Waiting on admission room to be cleansed, will transport pt after room ready

## 2023-04-25 NOTE — Assessment & Plan Note (Addendum)
Diet-controlled type 2 diabetes with last A1c of 6.2%.  No indication for SSI at this time

## 2023-04-25 NOTE — ED Notes (Signed)
Pt brief changed and pt put into a gown, non skid socks on pt. Pt given water and repositioned. Denies other needs at this time

## 2023-04-26 ENCOUNTER — Other Ambulatory Visit (HOSPITAL_COMMUNITY): Payer: Self-pay

## 2023-04-26 ENCOUNTER — Telehealth (HOSPITAL_COMMUNITY): Payer: Self-pay | Admitting: Pharmacy Technician

## 2023-04-26 ENCOUNTER — Other Ambulatory Visit: Payer: Self-pay

## 2023-04-26 ENCOUNTER — Inpatient Hospital Stay (HOSPITAL_COMMUNITY)
Admit: 2023-04-26 | Discharge: 2023-04-26 | Disposition: A | Discharge status: Home / Self Care | Payer: MEDICARE | Attending: Internal Medicine | Admitting: Internal Medicine

## 2023-04-26 DIAGNOSIS — I2693 Single subsegmental pulmonary embolism without acute cor pulmonale: Secondary | ICD-10-CM

## 2023-04-26 DIAGNOSIS — L8990 Pressure ulcer of unspecified site, unspecified stage: Secondary | ICD-10-CM | POA: Insufficient documentation

## 2023-04-26 DIAGNOSIS — I82412 Acute embolism and thrombosis of left femoral vein: Secondary | ICD-10-CM | POA: Diagnosis not present

## 2023-04-26 DIAGNOSIS — I82413 Acute embolism and thrombosis of femoral vein, bilateral: Secondary | ICD-10-CM | POA: Diagnosis not present

## 2023-04-26 DIAGNOSIS — I2699 Other pulmonary embolism without acute cor pulmonale: Secondary | ICD-10-CM | POA: Diagnosis not present

## 2023-04-26 LAB — CBC WITH DIFFERENTIAL/PLATELET
Abs Immature Granulocytes: 0.06 10*3/uL (ref 0.00–0.07)
Basophils Absolute: 0 10*3/uL (ref 0.0–0.1)
Basophils Relative: 1 %
Eosinophils Absolute: 0.3 10*3/uL (ref 0.0–0.5)
Eosinophils Relative: 5 %
HCT: 33.4 % — ABNORMAL LOW (ref 36.0–46.0)
Hemoglobin: 10.7 g/dL — ABNORMAL LOW (ref 12.0–15.0)
Immature Granulocytes: 1 %
Lymphocytes Relative: 30 %
Lymphs Abs: 1.6 10*3/uL (ref 0.7–4.0)
MCH: 30.2 pg (ref 26.0–34.0)
MCHC: 32 g/dL (ref 30.0–36.0)
MCV: 94.4 fL (ref 80.0–100.0)
Monocytes Absolute: 0.5 10*3/uL (ref 0.1–1.0)
Monocytes Relative: 10 %
Neutro Abs: 2.9 10*3/uL (ref 1.7–7.7)
Neutrophils Relative %: 53 %
Platelets: 214 10*3/uL (ref 150–400)
RBC: 3.54 MIL/uL — ABNORMAL LOW (ref 3.87–5.11)
RDW: 18.6 % — ABNORMAL HIGH (ref 11.5–15.5)
WBC: 5.4 10*3/uL (ref 4.0–10.5)
nRBC: 0 % (ref 0.0–0.2)

## 2023-04-26 LAB — GLUCOSE, CAPILLARY
Glucose-Capillary: 150 mg/dL — ABNORMAL HIGH (ref 70–99)
Glucose-Capillary: 110 mg/dL — ABNORMAL HIGH (ref 70–99)
Glucose-Capillary: 242 mg/dL — ABNORMAL HIGH (ref 70–99)
Glucose-Capillary: 141 mg/dL — ABNORMAL HIGH (ref 70–99)
Glucose-Capillary: 132 mg/dL — ABNORMAL HIGH (ref 70–99)

## 2023-04-26 LAB — BASIC METABOLIC PANEL
Anion gap: 9 (ref 5–15)
BUN: 26 mg/dL — ABNORMAL HIGH (ref 8–23)
CO2: 26 mmol/L (ref 22–32)
Calcium: 9.1 mg/dL (ref 8.9–10.3)
Chloride: 97 mmol/L — ABNORMAL LOW (ref 98–111)
Creatinine, Ser: 0.86 mg/dL (ref 0.44–1.00)
GFR, Estimated: >60 mL/min (ref >60)
Glucose, Bld: 124 mg/dL — ABNORMAL HIGH (ref 70–99)
Potassium: 4.4 mmol/L (ref 3.5–5.1)
Sodium: 132 mmol/L — ABNORMAL LOW (ref 135–145)

## 2023-04-26 LAB — HEPARIN LEVEL (UNFRACTIONATED)
Heparin Unfractionated: 0.46 [IU]/mL (ref 0.30–0.70)
Heparin Unfractionated: 0.43 [IU]/mL (ref 0.30–0.70)

## 2023-04-26 MED ORDER — CEFAZOLIN SODIUM-DEXTROSE 2-4 GM/100ML-% IV SOLN: 2.0000 g | INTRAVENOUS | Status: DC

## 2023-04-26 MED ORDER — HEPARIN (PORCINE) 25000 UT/250ML-% IV SOLN
INTRAVENOUS | Status: AC
  Filled 2023-04-26: qty 250

## 2023-04-26 MED ORDER — DIPHENHYDRAMINE HCL 50 MG/ML IJ SOLN: 50.0000 mg | Freq: Once | INTRAMUSCULAR | Status: DC | PRN

## 2023-04-26 MED ORDER — SODIUM CHLORIDE 0.9 % IV SOLN: INTRAVENOUS | Status: DC

## 2023-04-26 MED ORDER — METHYLPREDNISOLONE SODIUM SUCC 125 MG IJ SOLR: 125.0000 mg | Freq: Once | INTRAMUSCULAR | Status: DC | PRN

## 2023-04-26 MED ORDER — MIDAZOLAM HCL 2 MG/ML PO SYRP
8.0000 mg | ORAL_SOLUTION | Freq: Once | ORAL | Status: DC | PRN
  Filled 2023-04-26: qty 5

## 2023-04-26 MED ORDER — FAMOTIDINE 20 MG PO TABS: 40.0000 mg | ORAL_TABLET | Freq: Once | ORAL | Status: DC | PRN

## 2023-04-26 MED ORDER — FENTANYL CITRATE PF 50 MCG/ML IJ SOSY: 12.5000 ug | PREFILLED_SYRINGE | Freq: Once | INTRAMUSCULAR | Status: DC | PRN

## 2023-04-26 NOTE — Consult Note (Signed)
PHARMACY - ANTICOAGULATION CONSULT NOTE  Pharmacy Consult for heparin Indication: DVT  Patient Measurements: Height: 5\' 1"  (154.9 cm) Weight: 78.4 kg (172 lb 13.5 oz) IBW/kg (Calculated) : 47.8 Heparin Dosing Weight: 67.7 kg  Labs: Recent Labs    04/25/23 1738 04/25/23 1854 04/26/23 0310 04/26/23 1049  HGB 13.0  --  10.7*  --   HCT 41.0  --  33.4*  --   PLT 254  --  214  --   APTT  --  28  --   --   LABPROT  --  12.8  --   --   INR  --  1.0  --   --   HEPARINUNFRC  --   --  0.46 0.43  CREATININE 0.97  --  0.86  --   TROPONINIHS 7  --   --   --     Estimated Creatinine Clearance: 37.1 mL/min (by C-G formula based on SCr of 0.86 mg/dL).   Medical History: Past Medical History:  Diagnosis Date   Arrhythmia    Arthritis    Diabetes mellitus without complication (HCC)    Diverticulosis    Dysrhythmia    Edema of both feet    HOH (hard of hearing)    Hypertension    Hypothyroidism    Neuropathy    Osteoporosis    Sleep apnea    OSA--Use C-PAP   Thyroid disease    Baseline labs: No baseline labs   Medications:  Per chart review no PTA anticoagulation meds identified Previously filled apixaban for 3 day supply  Assessment: Cheryl Rios is a 89 yo f who presented to the DI due to a large blood clot in her L left via venous US. Pharmacy was consulted to manage and dose heparin.   Goal of Therapy:  Heparin level 0.3-0.7 units/ml Monitor platelets by anticoagulation protocol: Yes   Plan:  --12/4 at 1049, HL 0.43; therapeutic x 2. Continue heparin infusion at 1050 units/hr --Re-check HL / CBC tomorrow AM --Pending intervention in vascular lab 12/4  Tressie Ellis 04/26/2023 11:33 AM

## 2023-04-26 NOTE — TOC Initial Note (Signed)
Transition of Care [facility]) - Initial/Assessment Note    Patient Details  Name: Cheryl Rios MRN: [id number] Date of Birth: 03/06/1926  Transition of Care [facility]) CM/SW Contact:    Marlowe Sax, RN Phone Number: 04/26/2023, 10:49 AM  Clinical Narrative:                 The patient comes from home with her brother who is the Desiree Hane was in [employer] for [employer] in Late  Sept She has a rolling walker, single point cane and a 3 in1 at home, [facility] will continue to follow for needs  Expected Discharge Plan: Home w Hospice Care Barriers to Discharge: Continued Medical Work up   Patient Goals and CMS Choice            Expected Discharge Plan and Services   Discharge Planning Services: CM Consult   Living arrangements for the past 2 months: Single Family Home                 DME Arranged: N/A                    Prior Living Arrangements/Services Living arrangements for the past 2 months: Single Family Home Lives with:: Self, Relatives (Brother Esperanza Heir) Patient language and need for interpreter reviewed:: Yes Do you feel safe going back to the place where you live?: Yes      Need for Family Participation in Patient Care: Yes (Comment) Care giver support system in place?: Yes (comment) Current home services: DME (Rolling walker, 3 in 1, Single [employer]) Criminal Activity/Legal Involvement Pertinent to Current Situation/Hospitalization: No - Comment as needed  Activities of Daily Living   ADL Screening (condition at time of admission) Independently performs ADLs?: No Does the patient have a NEW difficulty with bathing/dressing/toileting/self-feeding that is expected to last >3 days?: No Does the patient have a NEW difficulty with getting in/out of bed, walking, or climbing stairs that is expected to last >3 days?: No Does the patient have a NEW difficulty with communication that is expected to last >3 days?: No Is the patient deaf or have  difficulty hearing?: No Does the patient have difficulty seeing, even when wearing glasses/contacts?: No Does the patient have difficulty concentrating, remembering, or making decisions?: No  Permission Sought/Granted   Permission granted to share information with : Yes, Verbal Permission Granted              Emotional Assessment Appearance:: Appears stated age   Affect (typically observed): Accepting Orientation: : Oriented to Self, Oriented to Place, Oriented to  Time Alcohol / Substance Use: Not Applicable Psych Involvement: No (comment)  Admission diagnosis:  Pulmonary embolism on right [facility]) [I26.99] Acute deep vein thrombosis (DVT) of femoral vein of left lower extremity (HCC) [I82.412] Acute deep vein thrombosis (DVT) of both lower extremities (HCC) [I82.403] Patient Active Problem List   Diagnosis Date Noted   Acute deep vein thrombosis (DVT) of femoral vein of left lower extremity (HCC) 04/26/2023   Pressure injury of skin 04/26/2023   Acute deep vein thrombosis (DVT) of both lower extremities (HCC) 04/25/2023   Pulmonary embolism on right (HCC) 04/25/2023   CKD stage 3a, GFR 45-59 ml/min (HCC) 04/25/2023   Dysphagia, oropharyngeal phase 02/16/2023   Hypothyroidism, unspecified 02/15/2023   Rheumatoid arthritis, unspecified (HCC) 02/15/2023   Closed left hip fracture, initial encounter (HCC) 02/10/2023   Hip fracture (HCC) 02/10/2023   Chronic kidney disease, unspecified 01/03/2023   Idiopathic  gout, unspecified site 01/03/2023   Non-pressure chronic ulcer of unspecified part of left lower leg limited to breakdown of skin (HCC) 09/27/2022   Chronic gouty arthritis 10/28/2021   OSA on CPAP 02/01/2021   CPAP use counseling 02/01/2021   Hypertension 02/01/2021   Obesity (BMI 30-39.9) 02/01/2021   Callus 07/13/2020   Pedal edema 11/08/2017   Choledocholithiasis    Sepsis (HCC) 02/05/2017   Fever 02/04/2017   Weakness 02/04/2017   Failure to thrive in adult  02/04/2017   OA (osteoarthritis) 02/04/2017   Status post total left knee replacement 09/19/2016   Hyperlipidemia, unspecified 08/09/2014   Type 2 diabetes mellitus without complications (HCC) 08/09/2014   Osteoporosis 10/04/2013   Elevated LFTs 01/23/2011   PCP:  Jerl Mina, MD Pharmacy:   [facility] [address] [place] - [address] [place] [address] Phone: [phone] Fax: [phone]     Social Determinants of Health (SDOH) Social History: SDOH Screenings   Food Insecurity: No Food Insecurity (04/26/2023)  Housing: Low Risk  (04/26/2023)  Transportation Needs: No Transportation Needs (04/26/2023)  Utilities: Not At Risk (04/26/2023)  Financial Resource Strain: Low Risk  (01/19/2023)   Received from [facility]  Tobacco Use: Low Risk  (04/25/2023)   SDOH Interventions:     Readmission Risk Interventions     No data to display

## 2023-04-26 NOTE — Telephone Encounter (Signed)
Patient Product/process development scientist completed.    The patient is insured through [employer]. Patient has Medicare and is not eligible for a copay card, but may be able to apply for patient assistance, if available.    Ran test claim for Eliquis 5 mg and the current 30 day co-pay is $161.19 due to a deductible remaining.  Ran test claim for Xarelto 20 mg and the current 30 day co-pay is $157.29 due to a deductible remaining.  This test claim was processed through [facility]- copay amounts may vary at other pharmacies due to pharmacy/plan contracts, or as the patient moves through the different stages of their insurance plan.     Roland Earl, CPHT Pharmacy Technician III Certified Patient Advocate [facility] Patient Advocate Team Direct Number: [phone]  Fax: [phone]

## 2023-04-26 NOTE — Consult Note (Signed)
PHARMACY - ANTICOAGULATION CONSULT NOTE  Pharmacy Consult for heparin Indication: DVT  No Known Allergies  Patient Measurements: Height: 5' 1.5" (156.2 cm) Weight: 83 kg (182 lb 15.7 oz) IBW/kg (Calculated) : 48.95 Heparin Dosing Weight: 67.7 kg  Vital Signs: Temp: 98.7 F (37.1 C) (12/04 0033) Temp Source: Oral (12/04 0033) BP: 110/60 (12/04 0033) Pulse Rate: 88 (12/04 0033)  Labs: Recent Labs    04/25/23 1738 04/25/23 1854 04/26/23 0310  HGB 13.0  --  10.7*  HCT 41.0  --  33.4*  PLT 254  --  214  APTT  --  28  --   LABPROT  --  12.8  --   INR  --  1.0  --   HEPARINUNFRC  --   --  0.46  CREATININE 0.97  --  0.86  TROPONINIHS 7  --   --     Estimated Creatinine Clearance: 38.7 mL/min (by C-G formula based on SCr of 0.86 mg/dL).   Medical History: Past Medical History:  Diagnosis Date   Arrhythmia    Arthritis    Diabetes mellitus without complication (HCC)    Diverticulosis    Dysrhythmia    Edema of both feet    HOH (hard of hearing)    Hypertension    Hypothyroidism    Neuropathy    Osteoporosis    Sleep apnea    OSA--Use C-PAP   Thyroid disease    Baseline labs: No baseline labs   Medications:  Per chart review no PTA anticoagulation meds identified Previously filled apixaban for 3 day supply  Assessment: DD is a 89 yo f who presented to the DI due to a large blood clot in her L left via venous US. Pharmacy was consulted to manage and dose heparin.   Goal of Therapy:  Heparin level 0.3-0.7 units/ml Monitor platelets by anticoagulation protocol: Yes   Plan:  12/4:  HL @ 0310 = 0.46, therapeutic X 1 - Will continue pt on current rate and recheck HL in 8 hrs  Hieu Herms D, PharmD 04/26/2023 3:59 AM

## 2023-04-26 NOTE — Plan of Care (Signed)
  Problem: Coping: Goal: Ability to adjust to condition or change in health will improve Outcome: Progressing   Problem: Fluid Volume: Goal: Ability to maintain a balanced intake and output will improve Outcome: Progressing   Problem: Metabolic: Goal: Ability to maintain appropriate glucose levels will improve Outcome: Progressing   Problem: Nutritional: Goal: Maintenance of adequate nutrition will improve Outcome: Progressing   Problem: Skin Integrity: Goal: Risk for impaired skin integrity will decrease Outcome: Progressing   Problem: Tissue Perfusion: Goal: Adequacy of tissue perfusion will improve Outcome: Progressing   Problem: Clinical Measurements: Goal: Diagnostic test results will improve Outcome: Progressing Goal: Respiratory complications will improve Outcome: Progressing Goal: Cardiovascular complication will be avoided Outcome: Progressing   Problem: Activity: Goal: Risk for activity intolerance will decrease Outcome: Progressing   Problem: Nutrition: Goal: Adequate nutrition will be maintained Outcome: Progressing   Problem: Elimination: Goal: Will not experience complications related to bowel motility Outcome: Progressing Goal: Will not experience complications related to urinary retention Outcome: Progressing   Problem: Pain Management: Goal: General experience of comfort will improve Outcome: Progressing   Problem: Safety: Goal: Ability to remain free from injury will improve Outcome: Progressing   Problem: Skin Integrity: Goal: Risk for impaired skin integrity will decrease Outcome: Progressing

## 2023-04-26 NOTE — Consult Note (Signed)
Hospital Consult    Reason for Consult:  Pulmonary Embolism with bilateral lower extremity DVT's Requesting Physician:  Dr Imagene Gurney MD  MRN #:  [id number]  History of Present Illness: This is a 89 y.o. female with medical history significant of CAD, CKD stage IIIa, hypertension, hypothyroidism, OSA on CPAP, osteoporosis, type 2 diabetes who presents to the ED due to left lower extremity swelling.  Upon work up patient underwent a CT venogram that demonstrated large occlusive thrombus extending from the left femoral vein just above the knee to the infrarenal IVC, nonocclusive thrombus of the right common femoral vein, right lower lobe segmental PE without right heart strain. Vascular Surgery consulted to evaluate.    Past Medical History:  Diagnosis Date   Arrhythmia    Arthritis    Diabetes mellitus without complication (HCC)    Diverticulosis    Dysrhythmia    Edema of both feet    HOH (hard of hearing)    Hypertension    Hypothyroidism    Neuropathy    Osteoporosis    Sleep apnea    OSA--Use C-PAP   Thyroid disease     Past Surgical History:  Procedure Laterality Date   ABDOMINAL HYSTERECTOMY     CATARACT EXTRACTION W/PHACO Right 07/19/2016   Procedure: CATARACT EXTRACTION PHACO AND INTRAOCULAR LENS PLACEMENT (IOC);  Surgeon: Lockie Mola, MD;  Location: [facility] ORS;  Service: Ophthalmology;  Laterality: Right;  [place] 0:55.8 AP% 14.3 CDE 9.52 Fluid pack lot # [id number]   CATARACT EXTRACTION W/PHACO Left 08/18/2016   Procedure: CATARACT EXTRACTION PHACO AND INTRAOCULAR LENS PLACEMENT (IOC);  Surgeon: Lockie Mola, MD;  Location: [facility] ORS;  Service: Ophthalmology;  Laterality: Left;  US1:03.1 AP%20.6 CDE13.2 Fluid pack lot # [id number]   CHOLECYSTECTOMY     ERCP N/A 02/07/2017   Procedure: ENDOSCOPIC RETROGRADE CHOLANGIOPANCREATOGRAPHY (ERCP);  Surgeon: Midge Minium, MD;  Location: [facility] ENDOSCOPY;  Service: Endoscopy;  Laterality: N/A;   FEMUR IM NAIL Left  02/11/2023   Procedure: INTRAMEDULLARY (IM) NAIL FEMORAL;  Surgeon: Campbell Stall, MD;  Location: [facility] ORS;  Service: Orthopedics;  Laterality: Left;   GALLBLADDER SURGERY     HEMORRHOID SURGERY     JOINT REPLACEMENT Bilateral    KNEE REPLACEMENT   KNEE SURGERY Right     No Known Allergies  Prior to Admission medications   Medication Sig Start Date End Date Taking? Authorizing Provider  ACCU-CHEK SOFTCLIX LANCETS lancets USE 1 EACH ONCE DAILY. USE AS INSTRUCTED. 05/31/18   [provider]  acetaminophen (TYLENOL) 500 MG tablet Take 2 tablets (1,000 mg total) by mouth every 8 (eight) hours as needed for mild pain, fever or headache. 02/15/23   Darlin Priestly, MD  allopurinol (ZYLOPRIM) 100 MG tablet Take 1.5 tablets by mouth daily. 12/14/20   [provider]  ammonium lactate (AMLACTIN) 12 % cream Apply 1 Application topically as needed for dry skin. 08/29/22   McDonald, Rachelle Hora, DPM  DULoxetine (CYMBALTA) 20 MG capsule Take 40 mg by mouth daily. 12/21/22   [provider]  feeding supplement, GLUCERNA SHAKE, (GLUCERNA SHAKE) LIQD Take 237 mLs by mouth 3 (three) times daily between meals. 02/15/23   Darlin Priestly, MD  folic acid (FOLVITE) 1 MG tablet Take 1 mg by mouth daily. 11/03/22   [provider]  furosemide (LASIX) 40 MG tablet Take 40 mg by mouth daily. In am.    [provider]  gabapentin (NEURONTIN) 100 MG capsule Take 200 mg by mouth 3 (three) times daily.  [provider]  glucose blood test strip TEST BLOOD SUGAR ONCE DAILY 05/29/18   [provider]  ketoconazole (NIZORAL) 2 % cream Apply 1 Application topically daily. 12/05/22   Freddie Breech, DPM  levothyroxine (SYNTHROID, LEVOTHROID) 75 MCG tablet TAKE 1 TABLET BY MOUTH ONCE DAILY ON EMPTY STOMACH WITH WATER 30-60 MIN BEFORE BREAKFAST 05/18/18   [provider]  lisinopril (PRINIVIL,ZESTRIL) 20 MG tablet TAKE 1 TABLET (20 MG TOTAL) BY MOUTH ONCE DAILY. 05/18/18    [provider]  Multiple Vitamin (MULTIVITAMIN WITH MINERALS) TABS tablet Take 1 tablet by mouth daily. 02/16/23   Darlin Priestly, MD  nystatin cream (MYCOSTATIN) Apply 1 Application topically 2 (two) times daily. 05/14/18   [provider]  oxyCODONE-acetaminophen (PERCOCET/ROXICET) 5-325 MG tablet Take 1 tablet by mouth every 6 (six) hours as needed for moderate pain. 02/14/23   Anson [place], PA-C    Social History   Socioeconomic History   Marital status: Widowed    Spouse name: Not on file   Number of children: Not on file   Years of education: Not on file   Highest education level: Not on file  Occupational History   Not on file  Tobacco Use   Smoking status: Never   Smokeless tobacco: Never  Vaping Use   Vaping status: Never Used  Substance and Sexual Activity   Alcohol use: No   Drug use: No   Sexual activity: Never  Other Topics Concern   Not on file  Social History Narrative   Not on file   Social Determinants of Health   Financial Resource Strain: Low Risk  (01/19/2023)   Received from [facility]   Overall Financial Resource Strain (CARDIA)    Difficulty of Paying Living Expenses: Not hard at all  Food Insecurity: No Food Insecurity (04/26/2023)   Hunger Vital Sign    Worried About Running Out of Food in the Last Year: Never true    Ran Out of Food in the Last Year: Never true  Transportation Needs: No Transportation Needs (04/26/2023)   PRAPARE - Administrator, Civil Service (Medical): No    Lack of Transportation (Non-Medical): No  Physical Activity: Not on file  Stress: Not on file  Social Connections: Not on file  Intimate Partner Violence: Not At Risk (04/26/2023)   Humiliation, Afraid, Rape, and Kick questionnaire    Fear of Current or Ex-Partner: No    Emotionally Abused: No    Physically Abused: No    Sexually Abused: No     Family History  Problem Relation Age of Onset   Breast cancer Neg Hx      ROS: Otherwise negative unless mentioned in HPI  Physical Examination  Vitals:   04/25/23 2330 04/26/23 0033  BP:  110/60  Pulse: 91 88  Resp: 16 18  Temp:  98.7 F (37.1 C)  SpO2: 100% 94%   Body mass index is 32.66 kg/m.  General:  WDWN in NAD Gait: Not observed HENT: WNL, normocephalic Patient very Hard of Hearing Pulmonary: normal non-labored breathing, without Rales, rhonchi,  wheezing Cardiac: regular, without  Murmurs, rubs or gallops; without carotid bruits Abdomen: Positive bowel sounds throughout, soft, NT/ND, no masses Skin: without rashes Vascular Exam/Pulses: Palpable pulses throughout except Left lower DP/PT due to +3 swelling/Edema Extremities: without ischemic changes, without Gangrene , with cellulitis; without open wounds;  Musculoskeletal: no muscle wasting or atrophy. Noted Stage 3 decubitus right posterior hip. Dressing intact.  Neurologic: A&O X 3;  No focal weakness or paresthesias are detected; speech is fluent/normal Psychiatric:  The pt has Normal affect. Lymph:  Unremarkable  CBC    Component Value Date/Time   WBC 5.4 04/26/2023 0310   RBC 3.54 (L) 04/26/2023 0310   HGB 10.7 (L) 04/26/2023 0310   HGB 12.5 07/06/2012 0431   HCT 33.4 (L) 04/26/2023 0310   HCT 42.2 06/18/2012 0917   PLT 214 04/26/2023 0310   PLT 150 07/06/2012 0431   MCV 94.4 04/26/2023 0310   MCV 89 06/18/2012 0917   MCH 30.2 04/26/2023 0310   MCHC 32.0 04/26/2023 0310   RDW 18.6 (H) 04/26/2023 0310   RDW 14.1 06/18/2012 0917   LYMPHSABS 1.6 04/26/2023 0310   MONOABS 0.5 04/26/2023 0310   EOSABS 0.3 04/26/2023 0310   BASOSABS 0.0 04/26/2023 0310    BMET    Component Value Date/Time   NA 132 (L) 04/26/2023 0310   NA 142 07/06/2012 0431   K 4.4 04/26/2023 0310   K 4.5 07/06/2012 0431   CL 97 (L) 04/26/2023 0310   CL 111 (H) 07/06/2012 0431   CO2 26 04/26/2023 0310   CO2 27 07/06/2012 0431   GLUCOSE 124 (H) 04/26/2023 0310   GLUCOSE 102 (H) 07/06/2012 0431    BUN 26 (H) 04/26/2023 0310   BUN 17 07/06/2012 0431   CREATININE 0.86 04/26/2023 0310   CREATININE 0.90 07/06/2012 0431   CALCIUM 9.1 04/26/2023 0310   CALCIUM 8.7 07/06/2012 0431   GFRNONAA >60 04/26/2023 0310   GFRNONAA 58 (L) 07/06/2012 0431   GFRAA >60 02/08/2017 0346   GFRAA >60 07/06/2012 0431    COAGS: Lab Results  Component Value Date   INR 1.0 04/25/2023   INR 0.9 06/18/2012     Non-Invasive Vascular Imaging:   EXAM:04/25/23 LEFT LOWER EXTREMITY VENOUS DOPPLER ULTRASOUND   TECHNIQUE: Gray-scale sonography with compression, as well as color and duplex ultrasound, were performed to evaluate the deep venous system(s) from the level of the common femoral vein through the popliteal and proximal calf veins.   COMPARISON:  LEFT lower extremity venous duplex, 02/21/2022. MRI abdomen, 02/06/2017.   FINDINGS: VENOUS   Heterogeneously echogenic, occlusive filling defect within and incompressibility of the imaged portions of the LEFT common femoral, superficial femoral, and popliteal veins, as well as the visualized calf veins. Thrombus extension into the visualized portions of profunda femoral vein and great saphenous vein.   Limited views of the contralateral common femoral vein are unremarkable.   OTHER   No abnormal fluid collection.   Limitations: none   IMPRESSION: Acute, extensive and occlusive DVT within the imaged LEFT lower extremity   The central extension of the thrombus is not visualized on this evaluation. Recommend CT venogram abdomen pelvis to evaluate/rule out pelvic versus caval extension, and consider consultation by vascular interventional specialist for potential further evaluation and management.  EXAM:04/25/23 CT VENOGRAM ABDOMEN AND PELVIS AND LOWER EXTREMITY BILATERAL   TECHNIQUE: Venographic phase images of the abdomen, pelvis and lower extremities were obtained following the administration of intravenous contrast. Multiplanar  reformats and maximum intensity projections were generated.   RADIATION DOSE REDUCTION: This exam was performed according to the departmental dose-optimization program which includes automated exposure control, adjustment of the mA and/or kV according to patient size and/or use of iterative reconstruction technique.   CONTRAST:  OMNIPAQUE IOHEXOL 350 MG/ML SOLN   COMPARISON:  Left lower extremity ultrasound dated 04/25/2023.   FINDINGS: Lower chest: The visualized  lung bases are clear. Right lower lobe segmental pulmonary artery embolus. The pulmonary arteries are only partially visualized on this CT of the abdomen pelvis. There is however no CT evidence of right heart straining.   No intra-abdominal free air or free fluid.   Hepatobiliary: The liver is unremarkable. No biliary dilatation. Cholecystectomy. No retained calcified stone noted in the central CBD.   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 No active inflammatory changes. No dilatation of the main pancreatic duct or gland atrophy.   Spleen: Normal in size without focal abnormality.   Adrenals/Urinary Tract: The adrenal glands are unremarkable. Small right renal cyst. There is no hydronephrosis on either side. There is symmetric enhancement of the kidneys bilaterally. The visualized ureters and urinary bladder appear unremarkable.   Stomach/Bowel: Severe sigmoid diverticulosis and scattered colonic diverticula without active inflammatory changes. Apparent focal area of wall thickening and irregularity of the gastric antrum with a focus of air (28/7 and coronal 39/5) likely related to respiratory motion. A gastric ulcer is less likely but not excluded. There is no bowel obstruction. The appendix is not visualized with certainty. No inflammatory changes identified in the right lower quadrant.    Vascular/Lymphatic: Advanced aortoiliac atherosclerotic disease. Large occlusive thrombus within the entire left femoral and deep femoral vein extending into the infrarenal IVC with proximal tip approximately 4.5 cm distal to the level of the main renal veins. There is however an accessory left renal vein at the level of the proximal tip of the thrombus extending from the IVC to the left main renal vein. Minimal peripheral and nonocclusive thrombus noted in the right common femoral vein, age indeterminate.   The SMV, splenic vein, and main portal vein are patent. No portal venous gas. There is no adenopathy.   Reproductive: Hysterectomy.  No suspicious adnexal masses.   Other: Midline vertical anterior pelvic wall incisional scar.   Musculoskeletal: Osteopenia with degenerative changes of the spine. Status post prior ORIF of left femoral neck comminuted fracture with nonunion at this time. Bilateral total knee arthroplasties. No acute osseous pathology.   IMPRESSION: 1. Large occlusive thrombus extending from the left femoral vein just above the knee into the infrarenal IVC. The proximal tip of the clot is at the level of an accessory left renal vein. 2. Minimal peripheral and nonocclusive thrombus in the right common femoral vein, age indeterminate. 3. Right lower lobe segmental pulmonary artery embolus. No CT evidence of right heart straining. 4. Severe sigmoid diverticulosis. No bowel obstruction. 5.  Aortic Atherosclerosis (ICD10-I70.0).   These results were called by telephone at the time of interpretation on 04/25/2023 at 9:09 pm to provider DAVID [facility] , who verbally acknowledged these results.  Statin:  No. Beta Blocker:  No. Aspirin:  No. ACEI:  Yes.   ARB:  No. CCB use:  No Other antiplatelets/anticoagulants:  No.    ASSESSMENT/PLAN: This is a 89 y.o. female who presents to [facility] emergency department due to increasing left lower extremity swelling and pain.  Upon  workup the patient underwent a CT venogram that demonstrated a large occlusive thrombus extending from the left femoral vein just above the knee to the IVC.  She is also noted to have a nonocclusive thrombus of the right common femoral vein and a right lower lobe segmental PE without any heart strain.  Vascular surgery plans on taking the patient to the vascular lab  later today on 04/26/2023 for left lower extremity and IVC thrombectomy with possible IVC filter placement.  I discussed in detail with the patient and her brother who is power of attorney Mr. Gwyndolyn Kaufman the procedure, benefits, risk, and complications.  Both verbalized her understanding and wished to proceed as soon as possible.  I answered all their questions this morning.  Patient is consented she has been made n.p.o. for the rest of the day for the procedure.  Patient was placed on a heparin infusion in the emergency department and continues to be on that as we speak.  I will be on-call to the procedure.  Nursing made aware of the patient's schedule procedure later this afternoon.   -I discussed the plan in detail with Dr Vilinda Flake MD and he agrees with the plan.    Marcie Bal [facility] 04/26/2023 7:39 AM

## 2023-04-26 NOTE — Consult Note (Signed)
WOC Nurse Consult Note: Reason for Consult:multiple pressure injuries Reviewed nursing flow sheets Wound type: Right upper buttocks: Stage 3 Pressure Injury: 1.5cm x 1.0cm  Right mid buttock: Stage 2 Pressure Injury; 1.0cm x 0.5cm  Right lower buttock; Stage 2 Pressure Injury: 0.5cm x 0.5cm  Left trochanter; Stage 3 Pressure Injury: 1cm x 1cm  Back; Stage 2 Pressure Injury: 1cm x 0.5cm  Pressure Injury POA: Yes Measurement: see above  Wound bed:all wounds described 100% pink and clean Drainage (amount, consistency, odor) no significant drainage from any of her wounds Periwound: intact  Dressing procedure/placement/frequency: Continue to manage wounds with nursing skin care order set. Adding single layer of xeroform gauze to the wound, top with foam. Changing every other day and PRN soilage.  Low air loss mattress for high risk patient with pressure injuries on more than one turning surface Prevalon boots bilaterally for high risk patient.     Re consult if needed, will not follow at this time. Thanks  Kassidi Elza [employer], RN,CWOCN, CNS, CWON-AP [phone])

## 2023-04-26 NOTE — Consult Note (Signed)
I have placed a request via Secure Chat to Dr. Fran Lowes requesting photos of the wound areas of concern to be placed in the EMR.      Brynda Heick [facility] MSN, RN,CWOCN, CNS, CWON-AP [phone])

## 2023-04-26 NOTE — Progress Notes (Signed)
Pt. States she ate 100% of late breakfast, including eggs, sausage,fruit, toast & a drink. MD made aware: procedure cancelled for today & pt. Put on schedule for Friday. Pt. And her brother made aware.

## 2023-04-26 NOTE — Progress Notes (Signed)
PROGRESS NOTE    Cheryl Rios  WNI:[id number] DOB: 1925/05/29 DOA: 04/25/2023 PCP: Jerl Mina, MD  142A/142A-AA  LOS: 1 day   Brief hospital course:   Assessment & Plan: Cheryl Rios is a 89 y.o. female with medical history significant of CAD, CKD stage IIIa, hypertension, hypothyroidism, OSA on CPAP, osteoporosis, type 2 diabetes who presents to the ED due to A-T.   Cheryl Rios states that she has been experiencing left lower extremity swelling for quite some time now.  She went to her PCP and was given antibiotics approximately 2 weeks ago without any improvement.  She endorses redness of her left leg but denies any pain, numbness or tingling.  She denies any chest pain, palpitations or shortness of breath.  Her brother at bedside states that she has been improving how much she can ambulate after coming home from rehab but is still only able to take a few steps using a walker before she has to rest.  Due to this, she does have a pressure ulcer that they have been working on improving.   * Acute deep vein thrombosis (DVT) of both lower extremities (HCC) Lower extremity Dopplers were notable for large occlusive thrombus.  Given extensive nature, vascular surgery has been consulted. Likely 2/2 hip fracture in Sep 2024, s/p surgery with associated increased sedentary lifestyle since.  Only pain experienced at this time is back pain. --cont heparin gtt --thrombectomy and possible IVC filter on Friday  Pulmonary embolism on right [facility]) Workup regarding DVT was notable for subsegmental PE without evidence of right heart strain.  Patient is not experiencing shortness of breath, and vital signs are stable with no tachycardia or hypoxia. --cont heparin gtt --thrombectomy and possible IVC filter on Friday  CKD stage 3a, GFR 45-59 ml/min (HCC) Renal function currently at baseline.  Hypothyroidism, unspecified --cont Synthroid  Type 2 diabetes mellitus without complications  (HCC) Diet-controlled type 2 diabetes with last A1c of 6.2%.  No indication for SSI at this time  Hypertension - Hold home antihypertensives for now given high risk for hypotension in the setting of PE and DVT.  Restart if blood pressure remains stable  OSA on CPAP - CPAP at bedtime  Pressure Injury, multiple --pending eval   DVT prophylaxis: FG:[id number] gtt Code Status: DNR  Family Communication:  Level of care: Telemetry Medical Dispo:   The patient is from: home Anticipated d/c is to: home Anticipated d/c date is: 2-3 days   Subjective and Interval History:  Did not go for thrombectomy today as planned due to having eaten breakfast.   Objective: Vitals:   04/26/23 0033 04/26/23 0829 04/26/23 1445 04/26/23 1551  BP: 110/60 (!) 97/48 (!) 106/57 (!) 104/50  Pulse: 88 69 95 92  Resp: 18 17 18 17   Temp: 98.7 F (37.1 C) (!) 97.3 F (36.3 C) 98 F (36.7 C) 98.9 F (37.2 C)  TempSrc: Oral  Oral   SpO2: 94% 94% 90% 92%  Weight: 78.4 kg     Height: 5\' 1"  (1.549 m)       Intake/Output Summary (Last 24 hours) at 04/26/2023 2101 Last data filed at 04/26/2023 1917 Gross per 24 hour  Intake 589.87 ml  Output 200 ml  Net 389.87 ml   Filed Weights   04/25/23 1707 04/26/23 0033  Weight: 83 kg 78.4 kg    Examination:   Constitutional: NAD, AAOx3 HEENT: conjunctivae and lids normal, EOMI CV: No cyanosis.   RESP: normal respiratory effort, on RA  Data Reviewed: I have personally reviewed labs and imaging studies  Time spent: 50 minutes  Darlin Priestly, MD [facility] If 7PM-7AM, please contact night-coverage 04/26/2023, 9:01 PM

## 2023-04-26 NOTE — Progress Notes (Signed)
*  PRELIMINARY RESULTS* Echocardiogram 2D Echocardiogram has been performed.  Cheryl Rios 04/26/2023, 2:22 PM

## 2023-04-26 NOTE — Plan of Care (Signed)

## 2023-04-27 DIAGNOSIS — I82413 Acute embolism and thrombosis of femoral vein, bilateral: Secondary | ICD-10-CM | POA: Diagnosis not present

## 2023-04-27 LAB — CBC
HCT: 32 % — ABNORMAL LOW (ref 36.0–46.0)
Hemoglobin: 10.3 g/dL — ABNORMAL LOW (ref 12.0–15.0)
MCH: 30.6 pg (ref 26.0–34.0)
MCHC: 32.2 g/dL (ref 30.0–36.0)
MCV: 95 fL (ref 80.0–100.0)
Platelets: 179 10*3/uL (ref 150–400)
RBC: 3.37 MIL/uL — ABNORMAL LOW (ref 3.87–5.11)
RDW: 18.7 % — ABNORMAL HIGH (ref 11.5–15.5)
WBC: 6.9 10*3/uL (ref 4.0–10.5)
nRBC: 0 % (ref 0.0–0.2)

## 2023-04-27 LAB — BASIC METABOLIC PANEL
Anion gap: 7 (ref 5–15)
BUN: 32 mg/dL — ABNORMAL HIGH (ref 8–23)
CO2: 27 mmol/L (ref 22–32)
Calcium: 9.1 mg/dL (ref 8.9–10.3)
Chloride: 100 mmol/L (ref 98–111)
Creatinine, Ser: 1.15 mg/dL — ABNORMAL HIGH (ref 0.44–1.00)
GFR, Estimated: 44 mL/min — ABNORMAL LOW (ref >60)
Glucose, Bld: 117 mg/dL — ABNORMAL HIGH (ref 70–99)
Potassium: 4.2 mmol/L (ref 3.5–5.1)
Sodium: 134 mmol/L — ABNORMAL LOW (ref 135–145)

## 2023-04-27 LAB — HEPARIN LEVEL (UNFRACTIONATED): Heparin Unfractionated: 0.46 [IU]/mL (ref 0.30–0.70)

## 2023-04-27 LAB — MAGNESIUM: Magnesium: 2.2 mg/dL (ref 1.7–2.4)

## 2023-04-27 MED ORDER — SODIUM CHLORIDE 0.9 % IV BOLUS
500.0000 mL | Freq: Once | INTRAVENOUS | Status: AC
  Administered 2023-04-27: 500 mL via INTRAVENOUS

## 2023-04-27 NOTE — Consult Note (Signed)
PHARMACY - ANTICOAGULATION CONSULT NOTE  Pharmacy Consult for heparin Indication: DVT  Patient Measurements: Height: 5\' 1"  (154.9 cm) Weight: 78.4 kg (172 lb 13.5 oz) IBW/kg (Calculated) : 47.8 Heparin Dosing Weight: 67.7 kg  Labs: Recent Labs    04/25/23 1738 04/25/23 1854 04/26/23 0310 04/26/23 1049 04/27/23 0442  HGB 13.0  --  10.7*  --  10.3*  HCT 41.0  --  33.4*  --  32.0*  PLT 254  --  214  --  179  APTT  --  28  --   --   --   LABPROT  --  12.8  --   --   --   INR  --  1.0  --   --   --   HEPARINUNFRC  --   --  0.46 0.43 0.46  CREATININE 0.97  --  0.86  --  1.15*  TROPONINIHS 7  --   --   --   --     Estimated Creatinine Clearance: 27.7 mL/min (A) (by C-G formula based on SCr of 1.15 mg/dL (H)).   Medical History: Past Medical History:  Diagnosis Date   Arrhythmia    Arthritis    Diabetes mellitus without complication (HCC)    Diverticulosis    Dysrhythmia    Edema of both feet    HOH (hard of hearing)    Hypertension    Hypothyroidism    Neuropathy    Osteoporosis    Sleep apnea    OSA--Use C-PAP   Thyroid disease    Baseline labs: No baseline labs   Medications:  Per chart review no PTA anticoagulation meds identified Previously filled apixaban for 3 day supply  Assessment: Cheryl Rios is a 89 yo f who presented to the DI due to a large blood clot in her L left via venous US. Pharmacy was consulted to manage and dose heparin.   Goal of Therapy:  Heparin level 0.3-0.7 units/ml Monitor platelets by anticoagulation protocol: Yes   Plan:  12/5: HL @ 0442 = 0.46, therapeutic X 3 - will continue pt on current rate and recheck HL on 12/6 with AM labs.  --Pending intervention in vascular lab 12/4  Cheryl Rios D 04/27/2023 5:54 AM

## 2023-04-27 NOTE — Progress Notes (Signed)
PROGRESS NOTE    Cheryl Rios  EXB:[id number] DOB: [birth year] DOA: 04/25/2023 PCP: Jerl Mina, MD  142A/142A-AA  LOS: 2 days   Brief hospital course:   Assessment & Plan: Cheryl Rios is a 89 y.o. female with medical history significant of CAD, CKD stage IIIa, hypertension, hypothyroidism, OSA on CPAP, osteoporosis, type 2 diabetes who presents to the ED due to A-T.   Cheryl Rios states that she has been experiencing left lower extremity swelling for quite some time now.  She went to her PCP and was given antibiotics approximately 2 weeks ago without any improvement.  She endorses redness of her left leg but denies any pain, numbness or tingling.  She denies any chest pain, palpitations or shortness of breath.  Her brother at bedside states that she has been improving how much she can ambulate after coming home from rehab but is still only able to take a few steps using a walker before she has to rest.  Due to this, she does have a pressure ulcer that they have been working on improving.   * Acute deep vein thrombosis (DVT) of both lower extremities (HCC) Lower extremity Dopplers were notable for large occlusive thrombus.  Given extensive nature, vascular surgery has been consulted. Likely 2/2 hip fracture in Sep 2024, s/p surgery with associated increased sedentary lifestyle since.  Only pain experienced at this time is back pain. --cont heparin gtt --thrombectomy and possible IVC filter tomorrow  Pulmonary embolism on right [facility]) Workup regarding DVT was notable for subsegmental PE without evidence of right heart strain.  Patient is not experiencing shortness of breath, and vital signs are stable with no tachycardia or hypoxia. --cont heparin gtt --thrombectomy and possible IVC filter on Friday  CKD stage 3a, GFR 45-59 ml/min (HCC) Renal function currently at baseline.  Hypothyroidism, unspecified --cont Synthroid  Type 2 diabetes mellitus without complications  (HCC) Diet-controlled type 2 diabetes with last A1c of 6.2%.  No indication for SSI at this time  Hypertension - Hold home antihypertensives for now given high risk for hypotension in the setting of PE and DVT.  Restart if blood pressure remains stable  OSA on CPAP - CPAP at bedtime  Multiple pressure ulcers --photos taken, pending wound RN eval   DVT prophylaxis: [id number] gtt Code Status: DNR  Family Communication:  Level of care: Telemetry Medical Dispo:   The patient is from: home Anticipated d/c is to: home Anticipated d/c date is: 2-3 days   Subjective and Interval History:  BP intermittently soft today.  Pressure ulcer wound check and photos taken today.   Objective: Vitals:   04/27/23 0051 04/27/23 0316 04/27/23 1606 04/27/23 1724  BP: (!) 94/40 (!) 93/46 (!) 88/50 139/82  Pulse: 86 81 93   Resp: 20     Temp: 98.7 F (37.1 C)  98.5 F (36.9 C)   TempSrc: Oral  Oral   SpO2: 94% 94% 96%   Weight:      Height:       No intake or output data in the 24 hours ending 04/27/23 2050  Filed Weights   04/25/23 1707 04/26/23 0033  Weight: 83 kg 78.4 kg    Examination:   Constitutional: NAD, AAOx3 HEENT: conjunctivae and lids normal, EOMI CV: No cyanosis.   RESP: normal respiratory effort, on RA SKIN: warm, dry Neuro: II - XII grossly intact.     Data Reviewed: I have personally reviewed labs and imaging studies  Time spent: 35 minutes  Cheryl Priestly, MD [facility] If 7PM-7AM, please contact night-coverage 04/27/2023, 8:50 PM

## 2023-04-27 NOTE — Progress Notes (Signed)
Progress Note    04/27/2023 11:58 AM * Surgery Date in Future *  Subjective:  This is a 89 y.o. female with medical history significant of CAD, CKD stage IIIa, hypertension, hypothyroidism, OSA on CPAP, osteoporosis, type 2 diabetes who presents to the ED due to left lower extremity swelling.   Upon work up patient underwent a CT venogram that demonstrated large occlusive thrombus extending from the left femoral vein just above the knee to the infrarenal IVC, nonocclusive thrombus of the right common femoral vein, right lower lobe segmental PE without right heart strain. Vascular Surgery consulted to evaluate.     Vitals:   04/27/23 0051 04/27/23 0316  BP: (!) 94/40 (!) 93/46  Pulse: 86 81  Resp: 20   Temp: 98.7 F (37.1 C)   SpO2: 94% 94%   Physical Exam: Cardiac: RRR, Normal S1, S2, No murmurs Lungs:  normal non-labored breathing, without Rales, rhonchi, wheezing  Incisions:  None Extremities:   without ischemic changes, without Gangrene , with cellulitis; without open wounds; Left lower extremity with +3 edema and swelling. Unable to palpate pulses in LLE Abdomen:  Positive bowel sounds throughout, soft, NT/ND, no masses  Neurologic: A&O X 3; No focal weakness or paresthesias are detected; speech is fluent/normal  CBC    Component Value Date/Time   WBC 6.9 04/27/2023 0442   RBC 3.37 (L) 04/27/2023 0442   HGB 10.3 (L) 04/27/2023 0442   HGB 12.5 07/06/2012 0431   HCT 32.0 (L) 04/27/2023 0442   HCT 42.2 06/18/2012 0917   PLT 179 04/27/2023 0442   PLT 150 07/06/2012 0431   MCV 95.0 04/27/2023 0442   MCV 89 06/18/2012 0917   MCH 30.6 04/27/2023 0442   MCHC 32.2 04/27/2023 0442   RDW 18.7 (H) 04/27/2023 0442   RDW 14.1 06/18/2012 0917   LYMPHSABS 1.6 04/26/2023 0310   MONOABS 0.5 04/26/2023 0310   EOSABS 0.3 04/26/2023 0310   BASOSABS 0.0 04/26/2023 0310    BMET    Component Value Date/Time   NA 134 (L) 04/27/2023 0442   NA 142 07/06/2012 0431   K 4.2  04/27/2023 0442   K 4.5 07/06/2012 0431   CL 100 04/27/2023 0442   CL 111 (H) 07/06/2012 0431   CO2 27 04/27/2023 0442   CO2 27 07/06/2012 0431   GLUCOSE 117 (H) 04/27/2023 0442   GLUCOSE 102 (H) 07/06/2012 0431   BUN 32 (H) 04/27/2023 0442   BUN 17 07/06/2012 0431   CREATININE 1.15 (H) 04/27/2023 0442   CREATININE 0.90 07/06/2012 0431   CALCIUM 9.1 04/27/2023 0442   CALCIUM 8.7 07/06/2012 0431   GFRNONAA 44 (L) 04/27/2023 0442   GFRNONAA 58 (L) 07/06/2012 0431   GFRAA >60 02/08/2017 0346   GFRAA >60 07/06/2012 0431    INR    Component Value Date/Time   INR 1.0 04/25/2023 1854   INR 0.9 06/18/2012 0917     Intake/Output Summary (Last 24 hours) at 04/27/2023 1158 Last data filed at 04/26/2023 1917 Gross per 24 hour  Intake 345.38 ml  Output --  Net 345.38 ml     Assessment/Plan:  89 y.o. female who presented to [facility] emergency department due to increasing left lower extremity swelling and pain.  Noted to have a large occlusive thrombus extending from the left femoral vein just above the knee to the IVC.  Also noted to have a nonocclusive thrombus of the right common femoral vein and right lower lobe segmental PE without heart strain.* Surgery Date  in Future *   Vascular surgery plans on taking the patient to the vascular lab tomorrow on 04/28/2023 for left lower extremity and IVC thrombectomy with possible IVC filter placement.  Again today I discussed the procedure with the patient and her brother Gwyndolyn Kaufman.  Plan remains the same and we will proceed as discussed yesterday.  Patient was made n.p.o. after midnight tonight for procedure tomorrow.  Patient remain on a heparin infusion until the procedure.  DVT prophylaxis: Heparin infusion  I discussed the plan in detail with Dr. [name] MD and he agrees with the plan.   Marcie Bal [facility] 04/27/2023 11:58 AM

## 2023-04-27 NOTE — Plan of Care (Signed)

## 2023-04-27 NOTE — H&P (View-Only) (Signed)
 Progress Note    04/27/2023 11:58 AM * Surgery Date in Future *  Subjective:  This is a 89 y.o. female with medical history significant of CAD, CKD stage IIIa, hypertension, hypothyroidism, OSA on CPAP, osteoporosis, type 2 diabetes who presents to the ED due to left lower extremity swelling.   Upon work up patient underwent a CT venogram that demonstrated large occlusive thrombus extending from the left femoral vein just above the knee to the infrarenal IVC, nonocclusive thrombus of the right common femoral vein, right lower lobe segmental PE without right heart strain. Vascular Surgery consulted to evaluate.     Vitals:   04/27/23 0051 04/27/23 0316  BP: (!) 94/40 (!) 93/46  Pulse: 86 81  Resp: 20   Temp: 98.7 F (37.1 C)   SpO2: 94% 94%   Physical Exam: Cardiac: RRR, Normal S1, S2, No murmurs Lungs:  normal non-labored breathing, without Rales, rhonchi, wheezing  Incisions:  None Extremities:   without ischemic changes, without Gangrene , with cellulitis; without open wounds; Left lower extremity with +3 edema and swelling. Unable to palpate pulses in LLE Abdomen:  Positive bowel sounds throughout, soft, NT/ND, no masses  Neurologic: A&O X 3; No focal weakness or paresthesias are detected; speech is fluent/normal  CBC    Component Value Date/Time   WBC 6.9 04/27/2023 0442   RBC 3.37 (L) 04/27/2023 0442   HGB 10.3 (L) 04/27/2023 0442   HGB 12.5 07/06/2012 0431   HCT 32.0 (L) 04/27/2023 0442   HCT 42.2 06/18/2012 0917   PLT 179 04/27/2023 0442   PLT 150 07/06/2012 0431   MCV 95.0 04/27/2023 0442   MCV 89 06/18/2012 0917   MCH 30.6 04/27/2023 0442   MCHC 32.2 04/27/2023 0442   RDW 18.7 (H) 04/27/2023 0442   RDW 14.1 06/18/2012 0917   LYMPHSABS 1.6 04/26/2023 0310   MONOABS 0.5 04/26/2023 0310   EOSABS 0.3 04/26/2023 0310   BASOSABS 0.0 04/26/2023 0310    BMET    Component Value Date/Time   NA 134 (L) 04/27/2023 0442   NA 142 07/06/2012 0431   K 4.2  04/27/2023 0442   K 4.5 07/06/2012 0431   CL 100 04/27/2023 0442   CL 111 (H) 07/06/2012 0431   CO2 27 04/27/2023 0442   CO2 27 07/06/2012 0431   GLUCOSE 117 (H) 04/27/2023 0442   GLUCOSE 102 (H) 07/06/2012 0431   BUN 32 (H) 04/27/2023 0442   BUN 17 07/06/2012 0431   CREATININE 1.15 (H) 04/27/2023 0442   CREATININE 0.90 07/06/2012 0431   CALCIUM 9.1 04/27/2023 0442   CALCIUM 8.7 07/06/2012 0431   GFRNONAA 44 (L) 04/27/2023 0442   GFRNONAA 58 (L) 07/06/2012 0431   GFRAA >60 02/08/2017 0346   GFRAA >60 07/06/2012 0431    INR    Component Value Date/Time   INR 1.0 04/25/2023 1854   INR 0.9 06/18/2012 0917     Intake/Output Summary (Last 24 hours) at 04/27/2023 1158 Last data filed at 04/26/2023 1917 Gross per 24 hour  Intake 345.38 ml  Output --  Net 345.38 ml     Assessment/Plan:  89 y.o. female who presented to [facility] emergency department due to increasing left lower extremity swelling and pain.  Noted to have a large occlusive thrombus extending from the left femoral vein just above the knee to the IVC.  Also noted to have a nonocclusive thrombus of the right common femoral vein and right lower lobe segmental PE without heart strain.* Surgery Date  in Future *   Vascular surgery plans on taking the patient to the vascular lab tomorrow on 04/28/2023 for left lower extremity and IVC thrombectomy with possible IVC filter placement.  Again today I discussed the procedure with the patient and her brother Gwyndolyn Kaufman.  Plan remains the same and we will proceed as discussed yesterday.  Patient was made n.p.o. after midnight tonight for procedure tomorrow.  Patient remain on a heparin infusion until the procedure.  DVT prophylaxis: Heparin infusion  I discussed the plan in detail with Dr. [name] MD and he agrees with the plan.   Marcie Bal [facility] 04/27/2023 11:58 AM

## 2023-04-28 ENCOUNTER — Encounter
Admission: EM | Disposition: A | Discharge status: Home / Self Care | Payer: Self-pay | Source: Home / Self Care | Attending: Hospitalist

## 2023-04-28 DIAGNOSIS — I82432 Acute embolism and thrombosis of left popliteal vein: Secondary | ICD-10-CM

## 2023-04-28 DIAGNOSIS — I82220 Acute embolism and thrombosis of inferior vena cava: Secondary | ICD-10-CM | POA: Diagnosis not present

## 2023-04-28 DIAGNOSIS — I82413 Acute embolism and thrombosis of femoral vein, bilateral: Secondary | ICD-10-CM | POA: Diagnosis not present

## 2023-04-28 DIAGNOSIS — I82422 Acute embolism and thrombosis of left iliac vein: Secondary | ICD-10-CM

## 2023-04-28 DIAGNOSIS — R0902 Hypoxemia: Secondary | ICD-10-CM

## 2023-04-28 DIAGNOSIS — I871 Compression of vein: Secondary | ICD-10-CM | POA: Diagnosis not present

## 2023-04-28 DIAGNOSIS — R0781 Pleurodynia: Secondary | ICD-10-CM | POA: Diagnosis not present

## 2023-04-28 DIAGNOSIS — I82412 Acute embolism and thrombosis of left femoral vein: Secondary | ICD-10-CM | POA: Diagnosis not present

## 2023-04-28 HISTORY — PX: PERIPHERAL VASCULAR THROMBECTOMY: CATH118306

## 2023-04-28 LAB — BASIC METABOLIC PANEL
Anion gap: 7 (ref 5–15)
BUN: 25 mg/dL — ABNORMAL HIGH (ref 8–23)
CO2: 26 mmol/L (ref 22–32)
Calcium: 9 mg/dL (ref 8.9–10.3)
Chloride: 101 mmol/L (ref 98–111)
Creatinine, Ser: 0.96 mg/dL (ref 0.44–1.00)
GFR, Estimated: 54 mL/min — ABNORMAL LOW (ref >60)
Glucose, Bld: 136 mg/dL — ABNORMAL HIGH (ref 70–99)
Potassium: 4.1 mmol/L (ref 3.5–5.1)
Sodium: 134 mmol/L — ABNORMAL LOW (ref 135–145)

## 2023-04-28 LAB — CBC
HCT: 33.4 % — ABNORMAL LOW (ref 36.0–46.0)
Hemoglobin: 10.6 g/dL — ABNORMAL LOW (ref 12.0–15.0)
MCH: 30.5 pg (ref 26.0–34.0)
MCHC: 31.7 g/dL (ref 30.0–36.0)
MCV: 96 fL (ref 80.0–100.0)
Platelets: 191 10*3/uL (ref 150–400)
RBC: 3.48 MIL/uL — ABNORMAL LOW (ref 3.87–5.11)
RDW: 18.5 % — ABNORMAL HIGH (ref 11.5–15.5)
WBC: 6.9 10*3/uL (ref 4.0–10.5)
nRBC: 0 % (ref 0.0–0.2)

## 2023-04-28 LAB — ECHOCARDIOGRAM COMPLETE
AR max vel: 2.44 cm2
AV Area VTI: 2.26 cm2
AV Area mean vel: 2.21 cm2
AV Mean grad: 4 mm[Hg]
AV Peak grad: 8.2 mm[Hg]
Ao pk vel: 1.43 m/s
Area-P 1/2: 4.44 cm2
Height: 61 in
MV VTI: 2.61 cm2
S' Lateral: 2.9 cm
Weight: 2765.45 [oz_av]

## 2023-04-28 LAB — HEPARIN LEVEL (UNFRACTIONATED): Heparin Unfractionated: 0.4 [IU]/mL (ref 0.30–0.70)

## 2023-04-28 LAB — MAGNESIUM: Magnesium: 2.2 mg/dL (ref 1.7–2.4)

## 2023-04-28 LAB — GLUCOSE, CAPILLARY
Glucose-Capillary: 110 mg/dL — ABNORMAL HIGH (ref 70–99)
Glucose-Capillary: 113 mg/dL — ABNORMAL HIGH (ref 70–99)

## 2023-04-28 SURGERY — PERIPHERAL VASCULAR THROMBECTOMY
Anesthesia: Moderate Sedation | Laterality: Left

## 2023-04-28 MED ORDER — HEPARIN SODIUM (PORCINE) 1000 UNIT/ML IJ SOLN
INTRAMUSCULAR | Status: AC
  Filled 2023-04-28: qty 10

## 2023-04-28 MED ORDER — DIPHENHYDRAMINE HCL 50 MG/ML IJ SOLN: 50.0000 mg | Freq: Once | INTRAMUSCULAR | Status: DC | PRN

## 2023-04-28 MED ORDER — FENTANYL CITRATE (PF) 100 MCG/2ML IJ SOLN
INTRAMUSCULAR | Status: AC
  Filled 2023-04-28: qty 2

## 2023-04-28 MED ORDER — HEPARIN SODIUM (PORCINE) 1000 UNIT/ML IJ SOLN
INTRAMUSCULAR | Status: DC | PRN
  Administered 2023-04-28: 4000 [IU] via INTRAVENOUS

## 2023-04-28 MED ORDER — METHYLPREDNISOLONE SODIUM SUCC 125 MG IJ SOLR: 125.0000 mg | Freq: Once | INTRAMUSCULAR | Status: DC | PRN

## 2023-04-28 MED ORDER — FENTANYL CITRATE PF 50 MCG/ML IJ SOSY
12.5000 ug | PREFILLED_SYRINGE | Freq: Once | INTRAMUSCULAR | Status: DC | PRN
Start: 2023-04-28 — End: 2023-04-28

## 2023-04-28 MED ORDER — IODIXANOL 320 MG/ML IV SOLN
INTRAVENOUS | Status: DC | PRN
  Administered 2023-04-28: 35 mL

## 2023-04-28 MED ORDER — LIDOCAINE HCL (PF) 1 % IJ SOLN
INTRAMUSCULAR | Status: DC | PRN
  Administered 2023-04-28: 10 mL

## 2023-04-28 MED ORDER — SODIUM CHLORIDE 0.9 % IV SOLN: INTRAVENOUS | Status: DC

## 2023-04-28 MED ORDER — CEFAZOLIN SODIUM-DEXTROSE 2-4 GM/100ML-% IV SOLN: 2.0000 g | INTRAVENOUS | Status: DC

## 2023-04-28 MED ORDER — MIDAZOLAM HCL 5 MG/5ML IJ SOLN
INTRAMUSCULAR | Status: AC
  Filled 2023-04-28: qty 5

## 2023-04-28 MED ORDER — HEPARIN (PORCINE) IN NACL 1000-0.9 UT/500ML-% IV SOLN
INTRAVENOUS | Status: DC | PRN
  Administered 2023-04-28: 1000 mL

## 2023-04-28 MED ORDER — FAMOTIDINE 20 MG PO TABS: 40.0000 mg | ORAL_TABLET | Freq: Once | ORAL | Status: DC | PRN

## 2023-04-28 MED ORDER — MIDAZOLAM HCL 2 MG/2ML IJ SOLN
INTRAMUSCULAR | Status: DC | PRN
  Administered 2023-04-28: 1 mg via INTRAVENOUS
  Administered 2023-04-28 (×3): .5 mg via INTRAVENOUS

## 2023-04-28 MED ORDER — MIDAZOLAM HCL 2 MG/ML PO SYRP
8.0000 mg | ORAL_SOLUTION | Freq: Once | ORAL | Status: DC | PRN
  Filled 2023-04-28: qty 5

## 2023-04-28 MED ORDER — FENTANYL CITRATE (PF) 100 MCG/2ML IJ SOLN
INTRAMUSCULAR | Status: DC | PRN
  Administered 2023-04-28 (×4): 25 ug via INTRAVENOUS

## 2023-04-28 MED ORDER — SODIUM CHLORIDE 0.9 % IV SOLN: INTRAVENOUS | Status: AC

## 2023-04-28 SURGICAL SUPPLY — 19 items
BALLN DORADO 10X80X80 (BALLOONS) ×1
BALLOON DORADO 10X80X80 (BALLOONS) IMPLANT
CANISTER PENUMBRA ENGINE (MISCELLANEOUS) IMPLANT
CATH ANGIO 5F PIGTAIL 65CM (CATHETERS) IMPLANT
CATH BEACON 5.038 65CM KMP-01 (CATHETERS) IMPLANT
CATH LIGHTNI FLASH 16XTORQ 100 (CATHETERS) IMPLANT
CLOSURE PERCLOSE PROSTYLE (VASCULAR PRODUCTS) IMPLANT
COVER PROBE ULTRASOUND 5X96 (MISCELLANEOUS) IMPLANT
DEVICE PRESTO INFLATION (MISCELLANEOUS) IMPLANT
GLIDEWIRE ADV .035X180CM (WIRE) IMPLANT
NDL ENTRY 21GA 7CM ECHOTIP (NEEDLE) IMPLANT
NEEDLE ENTRY 21GA 7CM ECHOTIP (NEEDLE) ×1
PACK ANGIOGRAPHY (CUSTOM PROCEDURE TRAY) ×1 IMPLANT
SET INTRO CAPELLA COAXIAL (SET/KITS/TRAYS/PACK) IMPLANT
SHEATH BRITE TIP 6FRX11 (SHEATH) IMPLANT
SHEATH INTRO CHECKFLO 16F 13 (SHEATH) IMPLANT
SUT MON AB 4-0 PC3 18 (SUTURE) IMPLANT
WIRE GUIDERIGHT .035X150 (WIRE) IMPLANT
WIRE NITINOL .018 (WIRE) IMPLANT

## 2023-04-28 NOTE — Consult Note (Signed)
PHARMACY - ANTICOAGULATION CONSULT NOTE  Pharmacy Consult for heparin Indication: DVT  Patient Measurements: Height: 5\' 1"  (154.9 cm) Weight: 78.4 kg (172 lb 13.5 oz) IBW/kg (Calculated) : 47.8 Heparin Dosing Weight: 67.7 kg  Labs: Recent Labs    04/25/23 1738 04/25/23 1738 04/25/23 1854 04/26/23 0310 04/26/23 1049 04/27/23 0442 04/28/23 0445  HGB 13.0  --   --  10.7*  --  10.3* 10.6*  HCT 41.0  --   --  33.4*  --  32.0* 33.4*  PLT 254  --   --  214  --  179 191  APTT  --   --  28  --   --   --   --   LABPROT  --   --  12.8  --   --   --   --   INR  --   --  1.0  --   --   --   --   HEPARINUNFRC  --    < >  --  0.46 0.43 0.46 0.40  CREATININE 0.97  --   --  0.86  --  1.15* 0.96  TROPONINIHS 7  --   --   --   --   --   --    < > = values in this interval not displayed.    Estimated Creatinine Clearance: 33.2 mL/min (by C-G formula based on SCr of 0.96 mg/dL).   Medical History: Past Medical History:  Diagnosis Date   Arrhythmia    Arthritis    Diabetes mellitus without complication (HCC)    Diverticulosis    Dysrhythmia    Edema of both feet    HOH (hard of hearing)    Hypertension    Hypothyroidism    Neuropathy    Osteoporosis    Sleep apnea    OSA--Use C-PAP   Thyroid disease    Baseline labs: No baseline labs   Medications:  Per chart review no PTA anticoagulation meds identified Previously filled apixaban for 3 day supply  Assessment: Cheryl Rios is a 89 yo f who presented to the DI due to a large blood clot in her L left via venous US. Pharmacy was consulted to manage and dose heparin.   Goal of Therapy:  Heparin level 0.3-0.7 units/ml Monitor platelets by anticoagulation protocol: Yes   Plan:  12/6:  HL @ 0445 = 0.40, therapeutic X 4  - will continue pt on current rate and recheck HL on 12/7 with AM labs.  --Pending intervention in vascular lab 12/4  Cheryl Rios D 04/28/2023 5:45 AM

## 2023-04-28 NOTE — Op Note (Signed)
VEIN AND VASCULAR SURGERY                                                                               OPERATIVE NOTE    Date of Surgery:  04/28/2023  PRE-OPERATIVE DIAGNOSIS: Symptomatic extensive left leg DVT extending into the inferior vena cava up to the level of the renal veins  POST-OPERATIVE DIAGNOSIS: Same  PROCEDURE: 1.    Ultrasound guidance for vascular access to the left superficial femoral vein  2.    Introduction catheter into inferior vena cava from the left superficial femoral vein 3.    Inferior venacavogram 4.    Mechanical thrombectomy of the left common femoral vein, left external iliac vein, left common iliac vein as well as the inferior vena cava from the confluence up to the level of the renal veins using the penumbra CAT 16 5.    Percutaneous transluminal angioplasty of the left common iliac vein to 10 mm   SURGEON: [name]  ASSISTANT(S): None  ANESTHESIA: Conscious sedation was administered by the interventional radiology RN under my direct supervision. IV Versed plus fentanyl were utilized. Continuous ECG, pulse oximetry and blood pressure was monitored throughout the entire procedure.  Conscious sedation was administered for a total of 53 minutes.  ESTIMATED BLOOD LOSS: 200  Contrast: 35  Fluoroscopy time: 6.0  FINDING(S): 1.  Thrombus within the IVC; thrombus within the left popliteal, superficial femoral vein and common femoral vein as well as the left iliac veins  SPECIMEN(S):  none  INDICATIONS:    Cheryl Rios is a 89 y.o. year old female who presents with massive swelling of the left leg quite painful in association with pleuritic chest pains and hypoxia as well as shortness of breath.  Inferior vena cava filter is indicated for this reason.  Risks and benefits including filter thrombosis, migration, fracture, bleeding, and infection were all discussed.  We  discussed that all IVC filters that we place can be removed if desired from the patient once the need for the filter has passed.    DESCRIPTION: After obtaining full informed written consent, the patient was brought back to the vascular suite. The skin was sterilely prepped and draped in a sterile surgical field was created.  Ultrasound was placed in a sterile sleeve.  The left superficial femoral vein was image with the ultrasound.  It was echolucent and non-compressible indicating thrombus as previously identified.  Image was recorded for the permanent record. The left superficial femoral vein was accessed under direct ultrasound guidance without difficulty with a micropuncture needle and a microwire was advanced without difficulty.   A microsheath was then inserted and then a J-wire was then placed. The dilator is passed over the wire and the 6 French sheath was placed into left superficial femoral vein.  Hand-injection contrast  demonstrated thrombus throughout the common femoral and distal external iliac vein.  An advantage wire was then negotiated with a Kumpe catheter into the inferior vena cava..  Inferior venacavogram was performed. This demonstrated a thrombus within the IVC to the level of the renal veins.  Advantage wire was reinserted and a 16 French sheath was placed.  The penumbra CAT 16 was then used to perform mechanical thrombectomy from the origin of the superficial femoral vein on the left through the common femoral vein external iliac vein common iliac vein and the IVC.  A total of 5 passes were made both going in the antegrade as well as the retrograde direction.  A large amount of thrombus was retrieved.  Hand-injection of contrast through the 16 French sheath demonstrated obstruction in the mid iliac venous system.  Therefore the Kumpe catheter was reintroduced over the wire and inferior venacavogram was performed with the Kumpe catheter and the distal IVC.  This now appears to be free  of thrombus.  The proximal left common iliac vein was then imaged and this was free of thrombus however there appears to be a greater than 80% stenosis in the distal common iliac vein.  The distal external iliac vein and the common femoral vein appeared to be essentially free of thrombus.  Having removed the overwhelming majority of the thrombus I elected to angioplasty and treat the venous stricture in the common iliac vein.  A 10 mm x 100 mm Dorado balloon was advanced across the lesion.  Inflation was then to 10 atm for approximately 1 minute.  The balloon was deflated repositioned slightly and a second angioplasty was performed to 8 atm for 1 minute.  Follow-up imaging now demonstrated patency of the venous system from the common femoral into the inferior vena cava and up to the atrium.  At this point I elected to terminate the procedure.  The previously placed Perclose was secured.  There was still significant oozing and a second Perclose device was deployed.  I then placed Surgicel down into the subcutaneous space and closed the skin with a Monocryl subcuticular stitch.  Pressure was then held for 10 minutes.  Interpretation: Initial images demonstrated thrombus within the vena cava all the way up to the renal veins. Initial images the left lower extremity then demonstrated extensive thrombus throughout the superficial and common as well as the external iliac and common iliac veins.  After successful thrombectomy the vast majority of the clot has been retrieved and removed.  This uncovered a venous stricture in the common iliac vein.  After angioplasty a 10 mm there appears to be wide patency of the iliac veins with now rapid flow of contrast into the inferior vena cava. COMPLICATIONS: None  CONDITION: Stable  [name]  04/28/2023,5:31 PM

## 2023-04-28 NOTE — Progress Notes (Addendum)
PROGRESS NOTE    JAMA ANKRUM  BJY:[id number] DOB: [birth year] DOA: 04/25/2023 PCP: Jerl Mina, MD  142A/142A-AA  LOS: 3 days   Brief hospital course:   Assessment & Plan: Cheryl Rios is a 89 y.o. female with medical history significant of CAD, CKD stage IIIa, hypertension, hypothyroidism, OSA on CPAP, osteoporosis, type 2 diabetes who presents to the ED due to A-T.   Cheryl Rios states that she has been experiencing left lower extremity swelling for quite some time now.  She went to her PCP and was given antibiotics approximately 2 weeks ago without any improvement.  She endorses redness of her left leg but denies any pain, numbness or tingling.  She denies any chest pain, palpitations or shortness of breath.  Her brother at bedside states that she has been improving how much she can ambulate after coming home from rehab but is still only able to take a few steps using a walker before she has to rest.  Due to this, she does have a pressure ulcer that they have been working on improving.   * Acute deep vein thrombosis (DVT) of both lower extremities (HCC) Lower extremity Dopplers were notable for large occlusive thrombus.  Given extensive nature, vascular surgery has been consulted. Likely 2/2 hip fracture in Sep 2024, s/p surgery with associated increased sedentary lifestyle since.  Only pain experienced at this time is back pain. --cont heparin gtt --thrombectomy today  Pulmonary embolism on right [facility]) Workup regarding DVT was notable for subsegmental PE without evidence of right heart strain.  Patient is not experiencing shortness of breath, and vital signs are stable with no tachycardia or hypoxia. --cont heparin gtt for now  CKD stage 3a, GFR 45-59 ml/min (HCC) Renal function currently at baseline.  Hypothyroidism, unspecified --cont Synthroid  Type 2 diabetes mellitus without complications (HCC) Diet-controlled type 2 diabetes with last A1c of 6.2%.  No indication  for SSI at this time  Hypertension - Hold home antihypertensives for now given high risk for hypotension in the setting of PE and DVT.  Restart if blood pressure remains stable  OSA on CPAP - CPAP at bedtime  Multiple pressure ulcers --wound care per order  Hypotension --MIVF@75  for 12 hours  Hyponatremia --mild, of unclear significance    DVT prophylaxis: [id number] gtt Code Status: DNR  Family Communication:  Level of care: Telemetry Medical Dispo:   The patient is from: home Anticipated d/c is to: to be determined Anticipated d/c date is: 1-2 days   Subjective and Interval History:  Pt received thrombectomy today.   Objective: Vitals:   04/28/23 1545 04/28/23 1600 04/28/23 1610 04/28/23 1637  BP: 110/61 (!) 113/58 (!) 108/56 (!) 113/56  Pulse: 90 86 89 86  Resp: (!) 22 16 15 16   Temp:      TempSrc:      SpO2: 95% 96% 95% 99%  Weight:      Height:        Intake/Output Summary (Last 24 hours) at 04/28/2023 1912 Last data filed at 04/28/2023 1103 Gross per 24 hour  Intake 123.73 ml  Output 500 ml  Net -376.27 ml    Filed Weights   04/25/23 1707 04/26/23 0033  Weight: 83 kg 78.4 kg    Examination:   Constitutional: NAD, alert HEENT: conjunctivae and lids normal, EOMI CV: No cyanosis.   RESP: normal respiratory effort, on RA   Data Reviewed: I have personally reviewed labs and imaging studies  Time spent: 35 minutes  Darlin Priestly, MD [facility] If 7PM-7AM, please contact night-coverage 04/28/2023, 7:12 PM

## 2023-04-28 NOTE — Interval H&P Note (Signed)
History and Physical Interval Note:  04/28/2023 2:31 PM  Cheryl Rios  has presented today for surgery, with the diagnosis of IVC thrombus with Bilateral lower extremity DVT.  The various methods of treatment have been discussed with the patient and family. After consideration of risks, benefits and other options for treatment, the patient has consented to  Procedure(s): PERIPHERAL VASCULAR THROMBECTOMY (Left) as a surgical intervention.  The patient's history has been reviewed, patient examined, no change in status, stable for surgery.  I have reviewed the patient's chart and labs.  Questions were answered to the patient's satisfaction.     [name]

## 2023-04-28 NOTE — Plan of Care (Signed)

## 2023-04-29 DIAGNOSIS — I82413 Acute embolism and thrombosis of femoral vein, bilateral: Secondary | ICD-10-CM | POA: Diagnosis not present

## 2023-04-29 LAB — BASIC METABOLIC PANEL
Anion gap: 5 (ref 5–15)
BUN: 20 mg/dL (ref 8–23)
CO2: 27 mmol/L (ref 22–32)
Calcium: 8.8 mg/dL — ABNORMAL LOW (ref 8.9–10.3)
Chloride: 102 mmol/L (ref 98–111)
Creatinine, Ser: 0.81 mg/dL (ref 0.44–1.00)
GFR, Estimated: >60 mL/min (ref >60)
Glucose, Bld: 125 mg/dL — ABNORMAL HIGH (ref 70–99)
Potassium: 3.9 mmol/L (ref 3.5–5.1)
Sodium: 134 mmol/L — ABNORMAL LOW (ref 135–145)

## 2023-04-29 LAB — CBC
HCT: 29.1 % — ABNORMAL LOW (ref 36.0–46.0)
Hemoglobin: 9.5 g/dL — ABNORMAL LOW (ref 12.0–15.0)
MCH: 30.4 pg (ref 26.0–34.0)
MCHC: 32.6 g/dL (ref 30.0–36.0)
MCV: 93 fL (ref 80.0–100.0)
Platelets: 186 10*3/uL (ref 150–400)
RBC: 3.13 MIL/uL — ABNORMAL LOW (ref 3.87–5.11)
RDW: 18.2 % — ABNORMAL HIGH (ref 11.5–15.5)
WBC: 6.3 10*3/uL (ref 4.0–10.5)
nRBC: 0.3 % — ABNORMAL HIGH (ref 0.0–0.2)

## 2023-04-29 LAB — MAGNESIUM: Magnesium: 1.9 mg/dL (ref 1.7–2.4)

## 2023-04-29 LAB — HEPARIN LEVEL (UNFRACTIONATED)
Heparin Unfractionated: 0.28 [IU]/mL — ABNORMAL LOW (ref 0.30–0.70)
Heparin Unfractionated: 0.42 [IU]/mL (ref 0.30–0.70)

## 2023-04-29 MED ORDER — HEPARIN BOLUS VIA INFUSION
1000.0000 [IU] | Freq: Once | INTRAVENOUS | Status: AC
  Administered 2023-04-29: 1000 [IU] via INTRAVENOUS
  Filled 2023-04-29: qty 1000

## 2023-04-29 MED ORDER — APIXABAN 5 MG PO TABS
5.0000 mg | ORAL_TABLET | Freq: Two times a day (BID) | ORAL | Status: DC
  Administered 2023-04-29 (×2): 5 mg via ORAL
  Filled 2023-04-29 (×2): qty 1

## 2023-04-29 NOTE — Progress Notes (Signed)
Subjective: Interval History: none..   Objective: Vital signs in last 24 hours: Temp:  [97.5 F (36.4 C)-97.9 F (36.6 C)] 97.8 F (36.6 C) (12/07 0827) Pulse Rate:  [0-101] 80 (12/07 0827) Resp:  [15-28] 17 (12/07 0827) BP: (95-137)/(43-98) 100/43 (12/07 0827) SpO2:  [89 %-100 %] 95 % (12/07 0827)  Intake/Output from previous day: 12/06 0701 - 12/07 0700 In: -  Out: 500 [Urine:500] Intake/Output this shift: No intake/output data recorded.  General appearance: alert  Some bruising at the insertion site for access in the left SFA, leg less edematous per the patient.  Lab Results: Recent Labs    04/28/23 0445 04/29/23 0239  WBC 6.9 6.3  HGB 10.6* 9.5*  HCT 33.4* 29.1*  PLT 191 186   BMET Recent Labs    04/28/23 0445 04/29/23 0239  NA 134* 134*  K 4.1 3.9  CL 101 102  CO2 26 27  GLUCOSE 136* 125*  BUN 25* 20  CREATININE 0.96 0.81  CALCIUM 9.0 8.8*    Studies/Results: PERIPHERAL VASCULAR CATHETERIZATION  Result Date: 04/28/2023 See surgical note for result.  ECHOCARDIOGRAM COMPLETE  Result Date: 04/28/2023    ECHOCARDIOGRAM REPORT   Patient Name:   Cheryl Rios Date of Exam: 04/26/2023 Medical Rec #:  [id number]        Height:       61.0 in Accession #:    [id number]       Weight:       172.8 lb Date of Birth:  1926/01/12        BSA:          1.775 m Patient Age:    89 years         BP:           97/48 mmHg Patient Gender: F                HR:           92 bpm. Exam Location:  [facility] Procedure: 2D Echo, Cardiac Doppler and Color Doppler Indications:     Pulmonary embolus  History:         Patient has prior history of Echocardiogram examinations, most                  recent 02/12/2023. Risk Factors:Hypertension, Diabetes,                  Dyslipidemia and Sleep Apnea. CKD, Technically difficult study.                  Patient is in a sitting position.  Sonographer:     Mikki Harbor Referring Phys:  [id number] Verdene Lennert Diagnosing Phys: Clotilde Dieter   Sonographer Comments: Technically difficult study due to poor echo windows and suboptimal apical window. Image acquisition challenging due to respiratory motion. IMPRESSIONS  1. Left ventricular ejection fraction, by estimation, is 60 to 65%. The left ventricle has normal function. The left ventricle has no regional wall motion abnormalities. Left ventricular diastolic parameters are consistent with Grade I diastolic dysfunction (impaired relaxation).  2. +McConnells sign consistent with PE. Right ventricular systolic function is mildly reduced. The right ventricular size is mildly enlarged. There is normal pulmonary artery systolic pressure. The estimated right ventricular systolic pressure is 24.5 mmHg.  3. The mitral valve is grossly normal. No evidence of mitral valve regurgitation. No evidence of mitral stenosis.  4. The aortic valve was not well visualized. Aortic valve regurgitation is not visualized. No  aortic stenosis is present. FINDINGS  Left Ventricle: Left ventricular ejection fraction, by estimation, is 60 to 65%. The left ventricle has normal function. The left ventricle has no regional wall motion abnormalities. The left ventricular internal cavity size was normal in size. There is  no left ventricular hypertrophy. Left ventricular diastolic parameters are consistent with Grade I diastolic dysfunction (impaired relaxation). Right Ventricle: +McConnells sign consistent with PE. The right ventricular size is mildly enlarged. No increase in right ventricular wall thickness. Right ventricular systolic function is mildly reduced. There is normal pulmonary artery systolic pressure. The tricuspid regurgitant velocity is 2.32 m/s, and with an assumed right atrial pressure of 3 mmHg, the estimated right ventricular systolic pressure is 24.5 mmHg. Left Atrium: Left atrial size was normal in size. Right Atrium: Right atrial size was normal in size. Pericardium: There is no evidence of pericardial effusion.  Mitral Valve: The mitral valve is grossly normal. No evidence of mitral valve regurgitation. No evidence of mitral valve stenosis. MV peak gradient, 4.2 mmHg. The mean mitral valve gradient is 2.0 mmHg. Tricuspid Valve: The tricuspid valve is not well visualized. Tricuspid valve regurgitation is trivial. Aortic Valve: The aortic valve was not well visualized. Aortic valve regurgitation is not visualized. No aortic stenosis is present. Aortic valve mean gradient measures 4.0 mmHg. Aortic valve peak gradient measures 8.2 mmHg. Aortic valve area, by VTI measures 2.26 cm. Pulmonic Valve: The pulmonic valve was not well visualized. Pulmonic valve regurgitation is not visualized. Aorta: The aortic root is normal in size and structure. IAS/Shunts: No atrial level shunt detected by color flow Doppler.  LEFT VENTRICLE PLAX 2D LVIDd:         4.60 cm   Diastology LVIDs:         2.90 cm   LV e' medial:    6.31 cm/s LV PW:         1.10 cm   LV E/e' medial:  12.4 LV IVS:        1.10 cm   LV e' lateral:   8.27 cm/s LVOT diam:     2.00 cm   LV E/e' lateral: 9.4 LV SV:         59 LV SV Index:   33 LVOT Area:     3.14 cm  RIGHT VENTRICLE RV Basal diam:  2.75 cm RV Mid diam:    2.80 cm LEFT ATRIUM             Index        RIGHT ATRIUM           Index LA diam:        3.30 cm 1.86 cm/m   RA Area:     13.80 cm LA Vol (A2C):   42.1 ml 23.72 ml/m  RA Volume:   30.50 ml  17.18 ml/m LA Vol (A4C):   18.5 ml 10.42 ml/m LA Biplane Vol: 28.3 ml 15.94 ml/m  AORTIC VALVE                    PULMONIC VALVE AV Area (Vmax):    2.44 cm     PV Vmax:       0.88 m/s AV Area (Vmean):   2.21 cm     PV Peak grad:  3.1 mmHg AV Area (VTI):     2.26 cm AV Vmax:           143.00 cm/s AV Vmean:  91.600 cm/s AV VTI:            0.260 m AV Peak Grad:      8.2 mmHg AV Mean Grad:      4.0 mmHg LVOT Vmax:         111.00 cm/s LVOT Vmean:        64.400 cm/s LVOT VTI:          0.187 m LVOT/AV VTI ratio: 0.72  AORTA Ao Root diam: 3.50 cm MITRAL VALVE                 TRICUSPID VALVE MV Area (PHT): 4.44 cm     TR Peak grad:   21.5 mmHg MV Area VTI:   2.61 cm     TR Vmax:        232.00 cm/s MV Peak grad:  4.2 mmHg MV Mean grad:  2.0 mmHg     SHUNTS MV Vmax:       1.03 m/s     Systemic VTI:  0.19 m MV Vmean:      57.9 cm/s    Systemic Diam: 2.00 cm MV Decel Time: 171 msec MV E velocity: 78.10 cm/s MV A velocity: 108.00 cm/s MV E/A ratio:  0.72 Sabina Custovic Electronically signed by Clotilde Dieter Signature Date/Time: 04/28/2023/12:57:48 PM    Final    CT VENOGRAM ABD/PELVIS/LOWER EXT BILAT  Result Date: 04/25/2023 CLINICAL DATA:  DVT throughout the entire left lower extremity. EXAM: CT VENOGRAM ABDOMEN AND PELVIS AND LOWER EXTREMITY BILATERAL TECHNIQUE: Venographic phase images of the abdomen, pelvis and lower extremities were obtained following the administration of intravenous contrast. Multiplanar reformats and maximum intensity projections were generated. RADIATION DOSE REDUCTION: This exam was performed according to the departmental dose-optimization program which includes automated exposure control, adjustment of the mA and/or kV according to patient size and/or use of iterative reconstruction technique. CONTRAST:  OMNIPAQUE IOHEXOL 350 MG/ML SOLN COMPARISON:  Left lower extremity ultrasound dated 04/25/2023. FINDINGS: Lower chest: The visualized lung bases are clear. Right lower lobe segmental pulmonary artery embolus. The pulmonary arteries are only partially visualized on this CT of the abdomen pelvis. There is however no CT evidence of right heart straining. No intra-abdominal free air or free fluid. Hepatobiliary: The liver is unremarkable. No biliary dilatation. Cholecystectomy. No retained calcified stone noted in the central CBD. 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 No active inflammatory changes. No dilatation of the main pancreatic  duct or gland atrophy. Spleen: Normal in size without focal abnormality. Adrenals/Urinary Tract: The adrenal glands are unremarkable. Small right renal cyst. There is no hydronephrosis on either side. There is symmetric enhancement of the kidneys bilaterally. The visualized ureters and urinary bladder appear unremarkable. Stomach/Bowel: Severe sigmoid diverticulosis and scattered colonic diverticula without active inflammatory changes. Apparent focal area of wall thickening and irregularity of the gastric antrum with a focus of air (28/7 and coronal 39/5) likely related to respiratory motion. A gastric ulcer is less likely but not excluded. There is no bowel obstruction. The appendix is not visualized with certainty. No inflammatory changes identified in the right lower quadrant. Vascular/Lymphatic: Advanced aortoiliac atherosclerotic disease. Large occlusive thrombus within the entire left femoral and deep femoral vein extending into the infrarenal IVC with proximal tip approximately 4.5 cm distal to the level of the main renal veins. There is however an accessory left renal vein at the level  of the proximal tip of the thrombus extending from the IVC to the left main renal vein. Minimal peripheral and nonocclusive thrombus noted in the right common femoral vein, age indeterminate. The SMV, splenic vein, and main portal vein are patent. No portal venous gas. There is no adenopathy. Reproductive: Hysterectomy.  No suspicious adnexal masses. Other: Midline vertical anterior pelvic wall incisional scar. Musculoskeletal: Osteopenia with degenerative changes of the spine. Status post prior ORIF of left femoral neck comminuted fracture with nonunion at this time. Bilateral total knee arthroplasties. No acute osseous pathology. IMPRESSION: 1. Large occlusive thrombus extending from the left femoral vein just above the knee into the infrarenal IVC. The proximal tip of the clot is at the level of an accessory left renal  vein. 2. Minimal peripheral and nonocclusive thrombus in the right common femoral vein, age indeterminate. 3. Right lower lobe segmental pulmonary artery embolus. No CT evidence of right heart straining. 4. Severe sigmoid diverticulosis. No bowel obstruction. 5.  Aortic Atherosclerosis (ICD10-I70.0). These results were called by telephone at the time of interpretation on 04/25/2023 at 9:09 pm to provider DAVID [facility] , who verbally acknowledged these results. Electronically Signed   By: Elgie Collard M.D.   On: 04/25/2023 21:11   US Venous Img Lower Unilateral Left (DVT)  Result Date: 04/25/2023 CLINICAL DATA:  left leg swelling EXAM: LEFT LOWER EXTREMITY VENOUS DOPPLER ULTRASOUND TECHNIQUE: Gray-scale sonography with compression, as well as color and duplex ultrasound, were performed to evaluate the deep venous system(s) from the level of the common femoral vein through the popliteal and proximal calf veins. COMPARISON:  LEFT lower extremity venous duplex, 02/21/2022. MRI abdomen, 02/06/2017. FINDINGS: VENOUS Heterogeneously echogenic, occlusive filling defect within and incompressibility of the imaged portions of the LEFT common femoral, superficial femoral, and popliteal veins, as well as the visualized calf veins. Thrombus extension into the visualized portions of profunda femoral vein and great saphenous vein. Limited views of the contralateral common femoral vein are unremarkable. OTHER No abnormal fluid collection. Limitations: none IMPRESSION: Acute, extensive and occlusive DVT within the imaged LEFT lower extremity The central extension of the thrombus is not visualized on this evaluation. Recommend CT venogram abdomen pelvis to evaluate/rule out pelvic versus caval extension, and consider consultation by vascular interventional specialist for potential further evaluation and management. These results will be called to the ordering clinician or representative by the Radiologist Assistant, and  communication documented in the PACS or [employer]. Roanna Banning, MD [facility] Electronically Signed   By: Roanna Banning M.D.   On: 04/25/2023 15:42   Anti-infectives: Anti-infectives (From admission, onward)    Start     Dose/Rate Route Frequency Ordered Stop   04/28/23 1327  ceFAZolin (ANCEF) IVPB 2g/100 mL premix  Status:  Discontinued        2 g 200 mL/hr over 30 Minutes Intravenous 30 min pre-op 04/28/23 1327 04/28/23 1628   04/26/23 1354  ceFAZolin (ANCEF) IVPB 2g/100 mL premix  Status:  Discontinued        2 g 200 mL/hr over 30 Minutes Intravenous 30 min pre-op 04/26/23 1354 04/26/23 1550       Assessment/Plan: s/p Procedure(s): PERIPHERAL VASCULAR THROMBECTOMY (Left) Plan: At this point, on heparin drip, can be converted to either Lovenox for 4 to 6 weeks versus DOAC.  At that point she can be converted to Plavix.  No stent was placed. No limitations on ambulation at this point from a vascular standpoint, she needs to  work toward ADLs to potentially get home with independent living as she did before.  Again no restrictions on mobility. Learta Codding 04/29/2023, 9:01 AM

## 2023-04-29 NOTE — Consult Note (Signed)
PHARMACY - ANTICOAGULATION CONSULT NOTE  Pharmacy Consult for heparin Indication: DVT  Patient Measurements: Height: 5\' 1"  (154.9 cm) Weight: 78.4 kg (172 lb 13.5 oz) IBW/kg (Calculated) : 47.8 Heparin Dosing Weight: 67.7 kg  Labs: Recent Labs    04/27/23 0442 04/28/23 0445 04/29/23 0239  HGB 10.3* 10.6* 9.5*  HCT 32.0* 33.4* 29.1*  PLT 179 191 186  HEPARINUNFRC 0.46 0.40 0.28*  CREATININE 1.15* 0.96 0.81    Estimated Creatinine Clearance: 39.4 mL/min (by C-G formula based on SCr of 0.81 mg/dL).   Medical History: Past Medical History:  Diagnosis Date   Arrhythmia    Arthritis    Diabetes mellitus without complication (HCC)    Diverticulosis    Dysrhythmia    Edema of both feet    HOH (hard of hearing)    Hypertension    Hypothyroidism    Neuropathy    Osteoporosis    Sleep apnea    OSA--Use C-PAP   Thyroid disease    Baseline labs: No baseline labs   Medications:  Per chart review no PTA anticoagulation meds identified Previously filled apixaban for 3 day supply  Assessment: DD is a 89 yo f who presented to the DI due to a large blood clot in her L left via venous US. Pharmacy was consulted to manage and dose heparin.   Goal of Therapy:  Heparin level 0.3-0.7 units/ml Monitor platelets by anticoagulation protocol: Yes   Plan:  12/7:  HL @ 0239 = 0.28, SUBtherapeutic  - Will order heparin 1000 units IV X 1 bolus and increase drip rate to 1150 units/hr - Will recheck HL 8 hrs after rate change  Carolle Ishii D 04/29/2023 3:45 AM

## 2023-04-29 NOTE — Progress Notes (Signed)
PHARMACY - ANTICOAGULATION CONSULT NOTE  Pharmacy Consult for apixaban Indication: DVT, PE    s/p heparin drip 12/3-12/6 and thrombectomy 12/6   No Known Allergies  Patient Measurements: Height: 5\' 1"  (154.9 cm) Weight: 78.4 kg (172 lb 13.5 oz) IBW/kg (Calculated) : 47.8 Heparin Dosing Weight:    Vital Signs: Temp: 97.8 F (36.6 C) (12/07 0827) BP: 100/43 (12/07 0827) Pulse Rate: 80 (12/07 0827)  Labs: Recent Labs    04/27/23 0442 04/28/23 0445 04/29/23 0239  HGB 10.3* 10.6* 9.5*  HCT 32.0* 33.4* 29.1*  PLT 179 191 186  HEPARINUNFRC 0.46 0.40 0.28*  CREATININE 1.15* 0.96 0.81    Estimated Creatinine Clearance: 39.4 mL/min (by C-G formula based on SCr of 0.81 mg/dL).   Medical History: Past Medical History:  Diagnosis Date   Arrhythmia    Arthritis    Diabetes mellitus without complication (HCC)    Diverticulosis    Dysrhythmia    Edema of both feet    HOH (hard of hearing)    Hypertension    Hypothyroidism    Neuropathy    Osteoporosis    Sleep apnea    OSA--Use C-PAP   Thyroid disease     Medications:  Medications Prior to Admission  Medication Sig Dispense Refill Last Dose   ACCU-CHEK SOFTCLIX LANCETS lancets USE 1 EACH ONCE DAILY. USE AS INSTRUCTED.      acetaminophen (TYLENOL) 500 MG tablet Take 2 tablets (1,000 mg total) by mouth every 8 (eight) hours as needed for mild pain, fever or headache.      allopurinol (ZYLOPRIM) 100 MG tablet Take 1.5 tablets by mouth daily.      ammonium lactate (AMLACTIN) 12 % cream Apply 1 Application topically as needed for dry skin. 385 g 2    DULoxetine (CYMBALTA) 20 MG capsule Take 40 mg by mouth daily.      feeding supplement, GLUCERNA SHAKE, (GLUCERNA SHAKE) LIQD Take 237 mLs by mouth 3 (three) times daily between meals.      folic acid (FOLVITE) 1 MG tablet Take 1 mg by mouth daily.      furosemide (LASIX) 40 MG tablet Take 40 mg by mouth daily. In am.      gabapentin (NEURONTIN) 100 MG capsule Take 200 mg by  mouth 3 (three) times daily.      glucose blood test strip TEST BLOOD SUGAR ONCE DAILY      ketoconazole (NIZORAL) 2 % cream Apply 1 Application topically daily. 60 g 2    levothyroxine (SYNTHROID, LEVOTHROID) 75 MCG tablet TAKE 1 TABLET BY MOUTH ONCE DAILY ON EMPTY STOMACH WITH WATER 30-60 MIN BEFORE BREAKFAST      lisinopril (PRINIVIL,ZESTRIL) 20 MG tablet TAKE 1 TABLET (20 MG TOTAL) BY MOUTH ONCE DAILY.      Multiple Vitamin (MULTIVITAMIN WITH MINERALS) TABS tablet Take 1 tablet by mouth daily.      nystatin cream (MYCOSTATIN) Apply 1 Application topically 2 (two) times daily.      oxyCODONE-acetaminophen (PERCOCET/ROXICET) 5-325 MG tablet Take 1 tablet by mouth every 6 (six) hours as needed for moderate pain. 30 tablet 0    Scheduled:   allopurinol  150 mg Oral Daily   apixaban  5 mg Oral BID   DULoxetine  40 mg Oral Daily   folic acid  1 mg Oral Daily   gabapentin  200 mg Oral TID   levothyroxine  75 mcg Oral Q0600   sodium chloride flush  3 mL Intravenous Q12H   Infusions:  Assessment: 89 yo F with bilateral DVTs and subsegmental PE. Likely 2/2 hip fracture in Sep 2024, s/p surgery with associated increased sedentary lifestyle since.   Hx: CAD, CKD stage IIIa, hypertension, hypothyroidism, OSA on CPAP, osteoporosis, type 2 diabetes   Heparin drip started 12/3. S/p Thrombectomy 12/6 and heparin drip resumed. Now to transition to apixaban Hgb 9.5  Plt 186  Scr 0.81  Goal of Therapy:  Monitor platelets by anticoagulation protocol: Yes   Plan:  Will transition heparin drip to apixaban 5 mg po BID  F/u CBC, renal fxn per protocol    Bari Mantis PharmD Clinical Pharmacist 04/29/2023

## 2023-04-29 NOTE — Progress Notes (Signed)
PROGRESS NOTE    Cheryl Rios  MVH:[id number] DOB: 08-28-1925 DOA: 04/25/2023 PCP: Jerl Mina, MD  [id number]-[id number]  LOS: 4 days   Brief hospital course:   Assessment & Plan: Cheryl Rios is a 89 y.o. female with medical history significant of CAD, CKD stage IIIa, hypertension, hypothyroidism, OSA on CPAP, osteoporosis, type 2 diabetes who presents to the ED due to A-T.   Cheryl Rios states that she has been experiencing left lower extremity swelling for quite some time now.  She went to her PCP and was given antibiotics approximately 2 weeks ago without any improvement.  She endorses redness of her left leg but denies any pain, numbness or tingling.  She denies any chest pain, palpitations or shortness of breath.  Her brother at bedside states that she has been improving how much she can ambulate after coming home from rehab but is still only able to take a few steps using a walker before she has to rest.  Due to this, she does have a pressure ulcer that they have been working on improving.   * Acute deep vein thrombosis (DVT) of both lower extremities (HCC) Lower extremity Dopplers were notable for large occlusive thrombus.  Given extensive nature, vascular surgery has been consulted. Likely 2/2 hip fracture in Sep 2024, s/p surgery with associated increased sedentary lifestyle since.  Only pain experienced at this time is back pain. --s/p thrombectomy on 12/6 --transition to Eliquis today  Pulmonary embolism on right [facility]) Workup regarding DVT was notable for subsegmental PE without evidence of right heart strain.  Patient is not experiencing shortness of breath, and vital signs are stable with no tachycardia or hypoxia. --transition to Eliquis today  CKD stage 3a, GFR 45-59 ml/min (HCC) Renal function currently at baseline.  Hypothyroidism, unspecified --cont Synthroid  Type 2 diabetes mellitus without complications (HCC) Diet-controlled type 2 diabetes with last A1c of  6.2%.  No indication for SSI at this time  Hypertension - Hold home antihypertensives for now given high risk for hypotension in the setting of PE and DVT.  Restart if blood pressure remains stable  OSA on CPAP - CPAP at bedtime  Multiple pressure ulcers --wound care per order  Hypotension, resolved --improved with MIVF. --oral hydration now  Hyponatremia --mild, of unclear significance    DVT prophylaxis: WU:[id number] Code Status: DNR  Family Communication:  Level of care: Telemetry Medical Dispo:   The patient is from: home Anticipated d/c is to: to be determined Anticipated d/c date is: 1-2 days   Subjective and Interval History:  No dyspnea.  Pt reported eating well.   Objective: Vitals:   04/28/23 1637 04/28/23 2344 04/29/23 0827 04/29/23 1607  BP: (!) 113/56 (!) 95/52 (!) 100/43 (!) 124/52  Pulse: 86 85 80 87  Resp: 16 16 17 16   Temp:  97.9 F (36.6 C) 97.8 F (36.6 C) 97.7 F (36.5 C)  TempSrc:  Oral  Oral  SpO2: 99% 96% 95% 97%  Weight:      Height:        Intake/Output Summary (Last 24 hours) at 04/29/2023 1901 Last data filed at 04/29/2023 1500 Gross per 24 hour  Intake 576.77 ml  Output --  Net 576.77 ml    Filed Weights   04/25/23 1707 04/26/23 0033  Weight: 83 kg 78.4 kg    Examination:   Constitutional: NAD, AAOx3 HEENT: conjunctivae and lids normal, EOMI CV: No cyanosis.   RESP: normal respiratory effort, on RA Neuro: II -  XII grossly intact.   Psych: Normal mood and affect.  Appropriate judgement and reason   Data Reviewed: I have personally reviewed labs and imaging studies  Time spent: 35 minutes  Darlin Priestly, MD [facility] If 7PM-7AM, please contact night-coverage 04/29/2023, 7:01 PM

## 2023-04-29 NOTE — Plan of Care (Signed)
  Problem: Education: Goal: Ability to describe self-care measures that may prevent or decrease complications (Diabetes Survival Skills Education) will improve Outcome: Progressing Goal: Individualized Educational Video(s) Outcome: Progressing   Problem: Coping: Goal: Ability to adjust to condition or change in health will improve Outcome: Progressing   Problem: Fluid Volume: Goal: Ability to maintain a balanced intake and output will improve Outcome: Progressing   Problem: Health Behavior/Discharge Planning: Goal: Ability to identify and utilize available resources and services will improve Outcome: Progressing Goal: Ability to manage health-related needs will improve Outcome: Progressing   Problem: Nutritional: Goal: Maintenance of adequate nutrition will improve Outcome: Progressing Goal: Progress toward achieving an optimal weight will improve Outcome: Progressing   Problem: Metabolic: Goal: Ability to maintain appropriate glucose levels will improve Outcome: Progressing   Problem: Skin Integrity: Goal: Risk for impaired skin integrity will decrease Outcome: Progressing   Problem: Tissue Perfusion: Goal: Adequacy of tissue perfusion will improve Outcome: Progressing   Problem: Health Behavior/Discharge Planning: Goal: Ability to manage health-related needs will improve Outcome: Progressing   Problem: Clinical Measurements: Goal: Ability to maintain clinical measurements within normal limits will improve Outcome: Progressing Goal: Will remain free from infection Outcome: Progressing Goal: Diagnostic test results will improve Outcome: Progressing Goal: Respiratory complications will improve Outcome: Progressing Goal: Cardiovascular complication will be avoided Outcome: Progressing   Problem: Nutrition: Goal: Adequate nutrition will be maintained Outcome: Progressing   Problem: Activity: Goal: Risk for activity intolerance will decrease Outcome:  Progressing   Problem: Coping: Goal: Level of anxiety will decrease Outcome: Progressing   Problem: Elimination: Goal: Will not experience complications related to bowel motility Outcome: Progressing Goal: Will not experience complications related to urinary retention Outcome: Progressing   Problem: Pain Management: Goal: General experience of comfort will improve Outcome: Progressing   Problem: Safety: Goal: Ability to remain free from injury will improve Outcome: Progressing   Problem: Skin Integrity: Goal: Risk for impaired skin integrity will decrease Outcome: Progressing

## 2023-04-29 NOTE — Evaluation (Addendum)
Physical Therapy Evaluation Patient Details Name: Cheryl Rios MRN: [id number] DOB: 1926-02-21 Today's Date: 04/29/2023  History of Present Illness  Pt is a 89 yo F presenting with LLE swelling, pleuritic chest pains, and hypoxia found with large acute DVT, now s/p peripheral vascular thrombectomy without stent. Current MD assessment includes CKD stage 3a, hypothyroidism, DM2, HTN, OSA, and pressure ulcers.  Clinical Impression  Pt was pleasant and motivated to participate during the session and put forth good effort throughout. Pt received in bed, A,O x4, and able to give detailed history. Pt struggled with supine>sit with min A at trunk and HHA x1 intermittently. Pt attempted transfers with min-mod A, however exhibited instability due to pushing through posterior lower legs against bed to complete. This resulted in difficulty static standing, with patient unable to correct posterior lean despite heavy multimodal cuing and attempted standing exercise; ultimately unable to static stand without physical assist, even with RW. Pt returned to bed with +2 max A for sit>supine. Pt's BP, HR, and SpO2 were monitored during session and remained WNL on RA, pt reporting no adverse symptoms throughout session. Pt will benefit from continued PT services upon discharge to safely address deficits listed in patient problem list for decreased caregiver assistance and eventual return to PLOF.        If plan is discharge home, recommend the following: Assistance with cooking/housework;Assist for transportation;Help with stairs or ramp for entrance;Two people to help with walking and/or transfers;Two people to help with bathing/dressing/bathroom;Direct supervision/assist for medications management;Direct supervision/assist for financial management   Can travel by private vehicle        Equipment Recommendations None recommended by PT  Recommendations for Other Services       Functional Status Assessment  Patient has had a recent decline in their functional status and demonstrates the ability to make significant improvements in function in a reasonable and predictable amount of time.     Precautions / Restrictions Precautions Precautions: Fall Restrictions Weight Bearing Restrictions: No      Mobility  Bed Mobility Overal bed mobility: Needs Assistance Bed Mobility: Supine to Sit, Sit to Supine     Supine to sit: Min assist, HOB elevated, Used rails Sit to supine: +2 for physical assistance, Max assist   General bed mobility comments: completed supine>sit slowly in small increments, with cuing for sequencing intermittent light HHA x1, trunk assist - reporting close to baseline; opted for +2 max A sit>supine due to pt fatigue and for safety/line management    Transfers Overall transfer level: Needs assistance Equipment used: Rolling walker (2 wheels) Transfers: Sit to/from Stand Sit to Stand: Min assist, Mod assist           General transfer comment: able to push through BLEs and pushoff through BUEs with fair strength for task, however, difficulty with coming to full stand despite heavy multimodal cuing for anterior weight shift at hips and upright posture, unable to fully static stand without lower legs against bed or physical trunk assist, heavy cuing for feet placement, hand placement, sequencing; two attempts    Ambulation/Gait               General Gait Details: unable 2/2 not being able to stand without assist/posterior lean despite heavy cuing  Stairs            Wheelchair Mobility     Tilt Bed    Modified Rankin (Stroke Patients Only)       Balance Overall balance assessment: Needs assistance Sitting-balance  support: Bilateral upper extremity supported, Feet supported Sitting balance-Leahy Scale: Fair Sitting balance - Comments: steady in static sitting without assist, but substantial use of hands on bed to widen BOS Postural control:  Posterior lean Standing balance support: Bilateral upper extremity supported, During functional activity, Reliant on assistive device for balance Standing balance-Leahy Scale: Poor Standing balance comment: unable to static stand without assist, self-selecting use of posterior lower legs against bed despite cuing for anterior hip translation and upright posture, RW wheels often lifting up despite heavy cuing to push down through RW - limited by posterior lean                             Pertinent Vitals/Pain Pain Assessment Pain Assessment: Faces Faces Pain Scale: No hurt    Home Living Family/patient expects to be discharged to:: Private residence Living Arrangements: Alone (family stays intermittently) Available Help at Discharge: Family;Personal care attendant;Available 24 hours/day (brother and sister n' law are there when personal aides are not) Type of Home: Other(Comment) (Condo) Home Access: Stairs to enter Entrance Stairs-Rails: Right;Left;Can reach both Entrance Stairs-Number of Steps: "several"   Home Layout: One level Home Equipment: Agricultural consultant (2 wheels);Cane - single point;BSC/3in1;Wheelchair - manual      Prior Function Prior Level of Function : Needs assist             Mobility Comments: limited household amb using RW very minimally, reporting that brother/aid pushes her in w/c for longer home distances; for leaving condo they will carry her in w/c over the steps (prior to hip fx/surgery amb with SPC) ADLs Comments: needing assist for most ADLs, IADLs due to limited mobility (prior to hip fx/surgery ind with ADLs and most IADLs)     Extremity/Trunk Assessment   Upper Extremity Assessment Upper Extremity Assessment: Generalized weakness    Lower Extremity Assessment Lower Extremity Assessment: Generalized weakness    Cervical / Trunk Assessment Cervical / Trunk Assessment: Kyphotic  Communication   Communication Communication: No  apparent difficulties Cueing Techniques: Verbal cues;Tactile cues;Visual cues  Cognition Arousal: Alert Behavior During Therapy: WFL for tasks assessed/performed Overall Cognitive Status: Within Functional Limits for tasks assessed                                          General Comments      Exercises General Exercises - Lower Extremity Hip Flexion/Marching: AROM, Both, 5 reps, Standing, Other (comment) (in attempt to correct posterior lean, proving difficult) Heel Raises: AROM, Both, 5 reps, Standing, Other (comment) (in attempt to correct posterior lean)   Assessment/Plan    PT Assessment Patient needs continued PT services  PT Problem List Decreased strength;Decreased mobility;Decreased range of motion;Decreased coordination;Decreased activity tolerance;Decreased balance;Decreased knowledge of use of DME;Decreased safety awareness       PT Treatment Interventions DME instruction;Therapeutic exercise;Gait training;Stair training;Functional mobility training;Therapeutic activities;Patient/family education;Balance training;Wheelchair mobility training    PT Goals (Current goals can be found in the Care Plan section)  Acute Rehab PT Goals Patient Stated Goal: get home PT Goal Formulation: With patient Time For Goal Achievement: 05/12/23 Potential to Achieve Goals: Good    Frequency Min 1X/week     Co-evaluation               AM-PAC PT "6 Clicks" Mobility  Outcome Measure Help needed turning from your back to  your side while in a flat bed without using bedrails?: A Little Help needed moving from lying on your back to sitting on the side of a flat bed without using bedrails?: A Lot Help needed moving to and from a bed to a chair (including a wheelchair)?: A Lot Help needed standing up from a chair using your arms (e.g., wheelchair or bedside chair)?: A Little Help needed to walk in hospital room?: A Lot Help needed climbing 3-5 steps with a railing?  : A Lot 6 Click Score: 14    End of Session Equipment Utilized During Treatment: Gait belt Activity Tolerance: Patient tolerated treatment well Patient left: in bed;with call bell/phone within reach;with bed alarm set;with nursing/sitter in room Nurse Communication: Mobility status;Other (comment) (pt requesting ice/cold water) PT Visit Diagnosis: Muscle weakness (generalized) (M62.81);Unsteadiness on feet (R26.81)    Time: 1610-9604 PT Time Calculation (min) (ACUTE ONLY): 56 min   Charges:               Rosiland Oz SPT 04/29/23, 4:24 PM This entire session was performed under direct supervision and direction of a licensed therapist/therapist assistant. I have personally read, edited and approve of the note as written.  Loran Senters, DPT

## 2023-04-30 DIAGNOSIS — I82413 Acute embolism and thrombosis of femoral vein, bilateral: Secondary | ICD-10-CM | POA: Diagnosis not present

## 2023-04-30 LAB — BASIC METABOLIC PANEL
Anion gap: 7 (ref 5–15)
BUN: 16 mg/dL (ref 8–23)
CO2: 26 mmol/L (ref 22–32)
Calcium: 9.2 mg/dL (ref 8.9–10.3)
Chloride: 101 mmol/L (ref 98–111)
Creatinine, Ser: 0.83 mg/dL (ref 0.44–1.00)
GFR, Estimated: >60 mL/min (ref >60)
Glucose, Bld: 125 mg/dL — ABNORMAL HIGH (ref 70–99)
Potassium: 3.9 mmol/L (ref 3.5–5.1)
Sodium: 134 mmol/L — ABNORMAL LOW (ref 135–145)

## 2023-04-30 LAB — MAGNESIUM: Magnesium: 1.9 mg/dL (ref 1.7–2.4)

## 2023-04-30 MED ORDER — APIXABAN 5 MG PO TABS: 5.0000 mg | ORAL_TABLET | Freq: Two times a day (BID) | ORAL | Status: DC

## 2023-04-30 MED ORDER — APIXABAN 5 MG PO TABS
10.0000 mg | ORAL_TABLET | Freq: Two times a day (BID) | ORAL | Status: DC
  Administered 2023-04-30 – 2023-05-02 (×4): 10 mg via ORAL
  Filled 2023-04-30 (×5): qty 2

## 2023-04-30 NOTE — Plan of Care (Signed)

## 2023-04-30 NOTE — Progress Notes (Signed)
PHARMACY - ANTICOAGULATION CONSULT NOTE  Pharmacy Consult for apixaban Indication: DVT, PE    s/p heparin drip 12/3-12/6 and thrombectomy 12/6   No Known Allergies  Patient Measurements: Height: 5\' 1"  (154.9 cm) Weight: 78.4 kg (172 lb 13.5 oz) IBW/kg (Calculated) : 47.8 Heparin Dosing Weight:    Vital Signs: Temp: 97.8 F (36.6 C) (12/07 2328) BP: 113/58 (12/07 2328) Pulse Rate: 88 (12/07 2328)  Labs: Recent Labs    04/28/23 0445 04/29/23 0239 04/29/23 1251 04/30/23 0338  HGB 10.6* 9.5*  --   --   HCT 33.4* 29.1*  --   --   PLT 191 186  --   --   HEPARINUNFRC 0.40 0.28* 0.42  --   CREATININE 0.96 0.81  --  0.83    Estimated Creatinine Clearance: 38.4 mL/min (by C-G formula based on SCr of 0.83 mg/dL).   Medical History: Past Medical History:  Diagnosis Date   Arrhythmia    Arthritis    Diabetes mellitus without complication (HCC)    Diverticulosis    Dysrhythmia    Edema of both feet    HOH (hard of hearing)    Hypertension    Hypothyroidism    Neuropathy    Osteoporosis    Sleep apnea    OSA--Use C-PAP   Thyroid disease     Medications:  Medications Prior to Admission  Medication Sig Dispense Refill Last Dose   ACCU-CHEK SOFTCLIX LANCETS lancets USE 1 EACH ONCE DAILY. USE AS INSTRUCTED.      acetaminophen (TYLENOL) 500 MG tablet Take 2 tablets (1,000 mg total) by mouth every 8 (eight) hours as needed for mild pain, fever or headache.      allopurinol (ZYLOPRIM) 100 MG tablet Take 1.5 tablets by mouth daily.      ammonium lactate (AMLACTIN) 12 % cream Apply 1 Application topically as needed for dry skin. 385 g 2    DULoxetine (CYMBALTA) 20 MG capsule Take 40 mg by mouth daily.      feeding supplement, GLUCERNA SHAKE, (GLUCERNA SHAKE) LIQD Take 237 mLs by mouth 3 (three) times daily between meals.      folic acid (FOLVITE) 1 MG tablet Take 1 mg by mouth daily.      furosemide (LASIX) 40 MG tablet Take 40 mg by mouth daily. In am.      gabapentin  (NEURONTIN) 100 MG capsule Take 200 mg by mouth 3 (three) times daily.      glucose blood test strip TEST BLOOD SUGAR ONCE DAILY      ketoconazole (NIZORAL) 2 % cream Apply 1 Application topically daily. 60 g 2    levothyroxine (SYNTHROID, LEVOTHROID) 75 MCG tablet TAKE 1 TABLET BY MOUTH ONCE DAILY ON EMPTY STOMACH WITH WATER 30-60 MIN BEFORE BREAKFAST      lisinopril (PRINIVIL,ZESTRIL) 20 MG tablet TAKE 1 TABLET (20 MG TOTAL) BY MOUTH ONCE DAILY.      Multiple Vitamin (MULTIVITAMIN WITH MINERALS) TABS tablet Take 1 tablet by mouth daily.      nystatin cream (MYCOSTATIN) Apply 1 Application topically 2 (two) times daily.      oxyCODONE-acetaminophen (PERCOCET/ROXICET) 5-325 MG tablet Take 1 tablet by mouth every 6 (six) hours as needed for moderate pain. 30 tablet 0    Scheduled:   allopurinol  150 mg Oral Daily   apixaban  10 mg Oral BID   Followed by   Cheryl Rios ON 05/07/2023] apixaban  5 mg Oral BID   DULoxetine  40 mg Oral Daily  folic acid  1 mg Oral Daily   gabapentin  200 mg Oral TID   levothyroxine  75 mcg Oral Q0600   sodium chloride flush  3 mL Intravenous Q12H   Infusions:   Assessment: 89 yo F with bilateral DVTs and subsegmental PE. Likely 2/2 hip fracture in Sep 2024, s/p surgery with associated increased sedentary lifestyle since.   Hx: CAD, CKD stage IIIa, hypertension, hypothyroidism, OSA on CPAP, osteoporosis, type 2 diabetes   Heparin drip started 12/3. S/p Thrombectomy 12/6 and heparin drip resumed. Now to transition to apixaban Hgb 9.5  Plt 186  Scr 0.81  Goal of Therapy:  Monitor platelets by anticoagulation protocol: Yes   Plan:  Per message with vascular MD-MD prefers using DVT treatment dosing of apixaban 10 mg po BID x 7 days then 5 mg po bid. F/u CBC, renal fxn per protocol    Bari Mantis PharmD Clinical Pharmacist 04/30/2023

## 2023-04-30 NOTE — Progress Notes (Signed)
PROGRESS NOTE    Cheryl Rios  UUV:[id number] DOB: December 10, 1925 DOA: 04/25/2023 PCP: Jerl Mina, MD  142A/142A-AA  LOS: 5 days   Brief hospital course:   Assessment & Plan: Cheryl Rios is a 89 y.o. female with medical history significant of CAD, CKD stage IIIa, hypertension, hypothyroidism, OSA on CPAP, osteoporosis, type 2 diabetes who presents to the ED due to A-T.   Cheryl Rios states that she has been experiencing left lower extremity swelling for quite some time now.  She went to her PCP and was given antibiotics approximately 2 weeks ago without any improvement.  She endorses redness of her left leg but denies any pain, numbness or tingling.  She denies any chest pain, palpitations or shortness of breath.  Her brother at bedside states that she has been improving how much she can ambulate after coming home from rehab but is still only able to take a few steps using a walker before she has to rest.  Due to this, she does have a pressure ulcer that they have been working on improving.   * Acute deep vein thrombosis (DVT) of both lower extremities (HCC) Lower extremity Dopplers were notable for large occlusive thrombus.  Given extensive nature, vascular surgery has been consulted. Likely 2/2 hip fracture in Sep 2024, s/p surgery with associated increased sedentary lifestyle since.  Only pain experienced at this time is back pain. --s/p thrombectomy on 12/6 --cont Eliquis  Pulmonary embolism on right [facility]) Workup regarding DVT was notable for subsegmental PE without evidence of right heart strain.  Patient is not experiencing shortness of breath, and vital signs are stable with no tachycardia or hypoxia. --cont Eliquis  CKD stage 3a, GFR 45-59 ml/min (HCC) Renal function currently at baseline.  Hypothyroidism, unspecified --cont Synthroid  Type 2 diabetes mellitus without complications (HCC) Diet-controlled type 2 diabetes with last A1c of 6.2%.  No indication for SSI  at this time  Hypertension - Hold home antihypertensives for now given high risk for hypotension in the setting of PE and DVT.  Restart if blood pressure remains stable  OSA on CPAP - CPAP at bedtime  Multiple pressure ulcers --wound care per order  Hypotension, resolved --improved with MIVF. --oral hydration now  Hyponatremia --mild, of unclear significance    DVT prophylaxis: [id number] Code Status: DNR  Family Communication: brother updated on the phone today Level of care: Telemetry Medical Dispo:   The patient is from: home Anticipated d/c is to: to be determined Anticipated d/c date is: when disposition determined   Subjective and Interval History:  Pt with no new complaint today.  RN reported more confusion as the day went on.   Objective: Vitals:   04/29/23 1607 04/29/23 2328 04/30/23 0812 04/30/23 1450  BP: (!) 124/52 (!) 113/58 (!) 148/99 (!) 143/60  Pulse: 87 88 (!) 106 92  Resp: 16 20 16 17   Temp: 97.7 F (36.5 C) 97.8 F (36.6 C) 97.7 F (36.5 C) 97.9 F (36.6 C)  TempSrc: Oral  Oral Oral  SpO2: 97% 96% 98% 97%  Weight:      Height:        Intake/Output Summary (Last 24 hours) at 04/30/2023 1847 Last data filed at 04/30/2023 1508 Gross per 24 hour  Intake --  Output 1050 ml  Net -1050 ml    Filed Weights   04/25/23 1707 04/26/23 0033  Weight: 83 kg 78.4 kg    Examination:   Constitutional: NAD, AAOx3 HEENT: conjunctivae and lids normal,  EOMI CV: No cyanosis.   RESP: normal respiratory effort, on RA Neuro: II - XII grossly intact.   Psych: Normal mood and affect.     Data Reviewed: I have personally reviewed labs and imaging studies  Time spent: 35 minutes  Darlin Priestly, MD [facility] If 7PM-7AM, please contact night-coverage 04/30/2023, 6:47 PM

## 2023-05-01 ENCOUNTER — Encounter: Payer: Self-pay | Admitting: Vascular Surgery

## 2023-05-01 DIAGNOSIS — I82413 Acute embolism and thrombosis of femoral vein, bilateral: Secondary | ICD-10-CM | POA: Diagnosis not present

## 2023-05-01 NOTE — TOC Progression Note (Addendum)
Transition of Care [facility]) - Progression Note    Patient Details  Name: Cheryl Rios MRN: [id number] Date of Birth: 26-Sep-1925  [facility] of [facility] CM/SW Contact  Marlowe Sax, RN Phone Number: 05/01/2023, 10:36 AM  Clinical Narrative:     [facility] in [place] will accept today for Long term care Medicaid Pending The brother is agreeable and is aware he will need to take her CPAP to them sometime once she is there Plan to DC tomorrow  Expected Discharge Plan: Home w Hospice Care Barriers to Discharge: Continued Medical Work up  Expected Discharge Plan and Services   Discharge Planning Services: CM Consult   Living arrangements for the past 2 months: Single Family Home                 DME Arranged: N/A                     Social Determinants of Health (SDOH) Interventions SDOH Screenings   Food Insecurity: No Food Insecurity (04/26/2023)  Housing: Low Risk  (04/26/2023)  Transportation Needs: No Transportation Needs (04/26/2023)  Utilities: Not At Risk (04/26/2023)  Financial Resource Strain: Low Risk  (01/19/2023)   Received from [facility]  Tobacco Use: Low Risk  (04/25/2023)    Readmission Risk Interventions     No data to display

## 2023-05-01 NOTE — Progress Notes (Signed)
PROGRESS NOTE    Cheryl Rios  ZOX:[id number] DOB: 1926-02-17 DOA: 04/25/2023 PCP: Jerl Mina, MD  142A/142A-AA  LOS: 6 days   Brief hospital course:   Assessment & Plan: Cheryl Rios is a 89 y.o. female with medical history significant of CAD, CKD stage IIIa, hypertension, hypothyroidism, OSA on CPAP, osteoporosis, type 2 diabetes who presents to the ED due to A-T.   Cheryl Rios states that she has been experiencing left lower extremity swelling for quite some time now.  She went to her PCP and was given antibiotics approximately 2 weeks ago without any improvement.  She endorses redness of her left leg but denies any pain, numbness or tingling.  She denies any chest pain, palpitations or shortness of breath.  Her brother at bedside states that she has been improving how much she can ambulate after coming home from rehab but is still only able to take a few steps using a walker before she has to rest.  Due to this, she does have a pressure ulcer that they have been working on improving.   * Acute deep vein thrombosis (DVT) of both lower extremities (HCC) Lower extremity Dopplers were notable for large occlusive thrombus.  Given extensive nature, vascular surgery has been consulted. Likely 2/2 hip fracture in Sep 2024, s/p surgery with associated increased sedentary lifestyle since.  Only pain experienced at this time is back pain. --s/p thrombectomy on 12/6 --cont Eliquis  Pulmonary embolism on right [facility]) Workup regarding DVT was notable for subsegmental PE without evidence of right heart strain.  Patient is not experiencing shortness of breath, and vital signs are stable with no tachycardia or hypoxia. --cont Eliquis  CKD stage 3a, GFR 45-59 ml/min (HCC) Renal function currently at baseline.  Hypothyroidism, unspecified --cont Synthroid  Type 2 diabetes mellitus without complications (HCC) Diet-controlled type 2 diabetes with last A1c of 6.2%.  No indication for SSI  at this time  Hypertension - Hold home antihypertensives for now given high risk for hypotension in the setting of PE and DVT.  Restart if blood pressure remains stable  OSA on CPAP - CPAP at bedtime  Multiple pressure ulcers --wound care per order  Hypotension, resolved --improved with MIVF. --oral hydration now  Hyponatremia --mild, of unclear significance    DVT prophylaxis: [id number] Code Status: DNR  Family Communication: brother updated at the bedside today Level of care: Telemetry Medical Dispo:   The patient is from: home Anticipated d/c is to: SNF LTC Anticipated d/c date is: tomorrow   Subjective and Interval History:  Pt was very sleepy today, didn't want to get up to eat.  When awoken was with it and oriented.   Objective: Vitals:   04/30/23 1450 04/30/23 2240 05/01/23 0838 05/01/23 1357  BP: (!) 143/60 138/71 120/65 (!) 139/58  Pulse: 92 (!) 110 84 70  Resp: 17 20 18 16   Temp: 97.9 F (36.6 C) 97.8 F (36.6 C) 97.6 F (36.4 C) 97.7 F (36.5 C)  TempSrc: Oral  Oral Oral  SpO2: 97% 96% 100% 100%  Weight:      Height:       No intake or output data in the 24 hours ending 05/01/23 1928   Filed Weights   04/25/23 1707 04/26/23 0033  Weight: 83 kg 78.4 kg    Examination:   Constitutional: NAD, very sleepy but oriented HEENT: conjunctivae and lids normal, EOMI CV: No cyanosis.   RESP: normal respiratory effort, on RA SKIN: warm, dry  Data Reviewed: I have personally reviewed labs and imaging studies  Time spent: 25 minutes  Darlin Priestly, MD [facility] If 7PM-7AM, please contact night-coverage 05/01/2023, 7:28 PM

## 2023-05-01 NOTE — [place] FL2 (Signed)
[place] MEDICAID FL2 LEVEL OF CARE FORM     IDENTIFICATION  Patient Name: Cheryl Rios Birthdate: 17-Mar-1926 Sex: female Admission Date (Current Location): 04/25/2023  [facility] and [place] Number:  Chiropodist and Address:  [facility], [address] [place], [place] [address]      Provider Number: [id number]  Attending Physician Name and Address:  Darlin Priestly, MD  Relative Name and Phone Number:  Gabriel Earing [phone]    Current Level of Care: Hospital Recommended Level of Care: Nursing Facility Prior Approval Number:    Date Approved/Denied:   PASRR Number: [id number]  Discharge Plan: Other (Comment)    Current Diagnoses: Patient Active Problem List   Diagnosis Date Noted   Acute deep vein thrombosis (DVT) of femoral vein of left lower extremity (HCC) 04/26/2023   Pressure injury of skin 04/26/2023   Acute deep vein thrombosis (DVT) of both lower extremities (HCC) 04/25/2023   Pulmonary embolism on right (HCC) 04/25/2023   CKD stage 3a, GFR 45-59 ml/min (HCC) 04/25/2023   Dysphagia, oropharyngeal phase 02/16/2023   Hypothyroidism, unspecified 02/15/2023   Rheumatoid arthritis, unspecified (HCC) 02/15/2023   Closed left hip fracture, initial encounter (HCC) 02/10/2023   Hip fracture (HCC) 02/10/2023   Chronic kidney disease, unspecified 01/03/2023   Idiopathic gout, unspecified site 01/03/2023   Non-pressure chronic ulcer of unspecified part of left lower leg limited to breakdown of skin (HCC) 09/27/2022   Chronic gouty arthritis 10/28/2021   OSA on CPAP 02/01/2021   CPAP use counseling 02/01/2021   Hypertension 02/01/2021   Obesity (BMI 30-39.9) 02/01/2021   Callus 07/13/2020   Pedal edema 11/08/2017   Choledocholithiasis    Sepsis (HCC) 02/05/2017   Fever 02/04/2017   Weakness 02/04/2017   Failure to thrive in adult 02/04/2017   OA (osteoarthritis) 02/04/2017   Status post total left knee replacement 09/19/2016    Hyperlipidemia, unspecified 08/09/2014   Type 2 diabetes mellitus without complications (HCC) 08/09/2014   Osteoporosis 10/04/2013   Elevated LFTs 01/23/2011    Orientation RESPIRATION BLADDER Height & Weight     Self  O2 (2 liters, cpap at night) Incontinent Weight: 78.4 kg Height:  5\' 1"  (154.9 cm)  BEHAVIORAL SYMPTOMS/MOOD NEUROLOGICAL BOWEL NUTRITION STATUS      Incontinent Diet (See DC Summary)  AMBULATORY STATUS COMMUNICATION OF NEEDS Skin   Extensive Assist (+2 assist) Verbally Normal                       Personal Care Assistance Level of Assistance  Bathing, Feeding, Dressing Bathing Assistance: Limited assistance Feeding assistance: Limited assistance Dressing Assistance: Limited assistance     Functional Limitations Info  Sight, Hearing, Speech Sight Info: Adequate Hearing Info: Adequate Speech Info: Adequate    SPECIAL CARE FACTORS FREQUENCY  PT (By licensed PT), OT (By licensed OT)     PT Frequency: 2 times per week OT Frequency: 2 times per week            Contractures Contractures Info: Not present    Additional Factors Info  Code Status, Allergies Code Status Info: DNR Allergies Info: NKDA           Current Medications (05/01/2023):  This is the current hospital active medication list Current Facility-Administered Medications  Medication Dose Route Frequency Provider Last Rate Last Admin   acetaminophen (TYLENOL) tablet 650 mg  650 mg Oral Q6H PRN Schnier, Latina Craver, MD   650 mg at 04/28/23 1214  Or   acetaminophen (TYLENOL) suppository 650 mg  650 mg Rectal Q6H PRN Schnier, Latina Craver, MD       allopurinol (ZYLOPRIM) tablet 150 mg  150 mg Oral Daily Schnier, Latina Craver, MD   150 mg at 05/01/23 1003   apixaban (ELIQUIS) tablet 10 mg  10 mg Oral BID Angelique Blonder, RPH   10 mg at 05/01/23 1003   Followed by   Melene Muller ON 05/07/2023] apixaban (ELIQUIS) tablet 5 mg  5 mg Oral BID Bari Mantis A, RPH       DULoxetine (CYMBALTA) DR  capsule 40 mg  40 mg Oral Daily Schnier, Latina Craver, MD   40 mg at 05/01/23 1003   folic acid (FOLVITE) tablet 1 mg  1 mg Oral Daily Schnier, Latina Craver, MD   1 mg at 05/01/23 1003   gabapentin (NEURONTIN) capsule 200 mg  200 mg Oral TID Renford Dills, MD   200 mg at 04/30/23 2231   levothyroxine (SYNTHROID) tablet 75 mcg  75 mcg Oral Q0600 Renford Dills, MD   75 mcg at 05/01/23 0619   ondansetron (ZOFRAN) tablet 4 mg  4 mg Oral Q6H PRN Schnier, Latina Craver, MD       Or   ondansetron [facility]) injection 4 mg  4 mg Intravenous Q6H PRN Schnier, Latina Craver, MD       polyethylene glycol (MIRALAX / GLYCOLAX) packet 17 g  17 g Oral Daily PRN Schnier, Latina Craver, MD       sodium chloride flush (NS) 0.9 % injection 3 mL  3 mL Intravenous Q12H Schnier, Latina Craver, MD   3 mL at 05/01/23 1003     Discharge Medications: Please see discharge summary for a list of discharge medications.  Relevant Imaging Results:  Relevant Lab Results:   Additional Information SS# [id number]  Marlowe Sax, RN

## 2023-05-01 NOTE — Progress Notes (Signed)
Physical Therapy Treatment Patient Details Name: Cheryl Rios MRN: [id number] DOB: Mar 24, 1926 Today's Date: 05/01/2023   History of Present Illness Pt is a 89 yo F presenting with LLE swelling, pleuritic chest pains, and hypoxia found with large acute DVT, now s/p peripheral vascular thrombectomy without stent. Current MD assessment includes CKD stage 3a, hypothyroidism, DM2, HTN, OSA, and pressure ulcers.    PT Comments  Pt sleeping in bed upon PT arrival; pt able to intermittently briefly open eyes when cued but unable to keep eyes opened; pt verbalized yes when asked if she wanted to sit on EOB.  During session pt max assist x2 with bed mobility and max assist with sitting balance d/t posterior lean (limited time sitting d/t safety concerns--pt starting to slide forward on EOB so pt assisted back to bed).  Pt's brother present and reports pt more drowsy than normal--discussed this with pt's nurse who reported she also noted concerns with pt's level of alertness this morning and would notify MD.  Will continue to focus on strengthening, balance, and progressive functional mobility per pt tolerance.   If plan is discharge home, recommend the following: Assistance with cooking/housework;Assist for transportation;Help with stairs or ramp for entrance;Two people to help with walking and/or transfers;Two people to help with bathing/dressing/bathroom;Direct supervision/assist for medications management;Direct supervision/assist for financial management   Can travel by private vehicle      No  Equipment Recommendations  Rolling walker (2 wheels);BSC/3in1;Wheelchair (measurements PT);Wheelchair cushion (measurements PT);Hospital bed;Hoyer lift    Recommendations for Other Services       Precautions / Restrictions Precautions Precautions: Fall Restrictions Weight Bearing Restrictions: No     Mobility  Bed Mobility Overal bed mobility: Needs Assistance Bed Mobility: Supine to Sit, Sit to  Supine     Supine to sit: Max assist, +2 for physical assistance, HOB elevated Sit to supine: Max assist, +2 for physical assistance, HOB elevated   General bed mobility comments: assist for trunk and B LE's; vc's for technique    Transfers                   General transfer comment: pt with posterior lean in sitting requiring max assist to maintain upright posture; limited time sitting on EOB d/t safety concerns (pt starting to slide forward on bed)    Ambulation/Gait               General Gait Details: unable to maintain sitting balance safely to attempt to stand/ambulate   Stairs             Wheelchair Mobility     Tilt Bed    Modified Rankin (Stroke Patients Only)       Balance Overall balance assessment: Needs assistance Sitting-balance support: Bilateral upper extremity supported, Feet supported Sitting balance-Leahy Scale: Poor Sitting balance - Comments: significant posterior lean noted in sitting requiring max assist to maintain sitting balance Postural control: Posterior lean                                  Cognition Arousal: Lethargic Behavior During Therapy: Flat affect                                   General Comments: Pt briefly opening eyes intermittently with cueing and either nodding head yes/no or verbalizing 1 word responses occasionally  Exercises      General Comments  Pt agreeable to PT session.      Pertinent Vitals/Pain Pain Assessment Pain Assessment: Faces Faces Pain Scale: No hurt Pain Intervention(s): Limited activity within patient's tolerance, Monitored during session Vitals (HR and SpO2 on room air) stable and WFL throughout treatment session.    Home Living                          Prior Function            PT Goals (current goals can now be found in the care plan section) Acute Rehab PT Goals Patient Stated Goal: get home PT Goal Formulation: With  patient Time For Goal Achievement: 05/12/23 Potential to Achieve Goals: Fair Progress towards PT goals: Not progressing toward goals - comment (pt lethargic today; difficulty with sitting balance)    Frequency    Min 1X/week      PT Plan      Co-evaluation              AM-PAC PT "6 Clicks" Mobility   Outcome Measure  Help needed turning from your back to your side while in a flat bed without using bedrails?: A Lot Help needed moving from lying on your back to sitting on the side of a flat bed without using bedrails?: Total Help needed moving to and from a bed to a chair (including a wheelchair)?: Total Help needed standing up from a chair using your arms (e.g., wheelchair or bedside chair)?: Total Help needed to walk in hospital room?: Total Help needed climbing 3-5 steps with a railing? : Total 6 Click Score: 7    End of Session   Activity Tolerance: Patient limited by lethargy Patient left: in bed;with call bell/phone within reach;with bed alarm set;with family/visitor present;Other (comment) (B heels floating via pillow support) Nurse Communication: Mobility status;Precautions;Other (comment) (Pt's lethargy) PT Visit Diagnosis: Muscle weakness (generalized) (M62.81);Unsteadiness on feet (R26.81)     Time: 1610-9604 PT Time Calculation (min) (ACUTE ONLY): 16 min  Charges:    $Therapeutic Activity: 8-22 mins PT General Charges $$ ACUTE PT VISIT: 1 Visit                     Hendricks Limes, PT 05/01/23, 4:38 PM

## 2023-05-01 NOTE — TOC Progression Note (Signed)
Transition of Care [facility]) - Progression Note    Patient Details  Name: Cheryl Rios MRN: [id number] Date of Birth: 25-May-1925  Transition of Care [facility]) CM/SW Contact  Marlowe Sax, RN Phone Number: 05/01/2023, 9:09 AM  Clinical Narrative:    The patient was at [facility] for 42 days and went home 03/29/23, she has not had a custodial break for her days to restart    Expected Discharge Plan: Home w Hospice Care Barriers to Discharge: Continued Medical Work up  Expected Discharge Plan and Services   Discharge Planning Services: CM Consult   Living arrangements for the past 2 months: Single Family Home                 DME Arranged: N/A                     Social Determinants of Health (SDOH) Interventions SDOH Screenings   Food Insecurity: No Food Insecurity (04/26/2023)  Housing: Low Risk  (04/26/2023)  Transportation Needs: No Transportation Needs (04/26/2023)  Utilities: Not At Risk (04/26/2023)  Financial Resource Strain: Low Risk  (01/19/2023)   Received from [facility]  Tobacco Use: Low Risk  (04/25/2023)    Readmission Risk Interventions     No data to display

## 2023-05-01 NOTE — Progress Notes (Signed)
Progress Note    05/01/2023 11:37 AM 3 Days Post-Op  Subjective:  This is a 89 y.o. female with medical history significant of CAD, CKD stage IIIa, hypertension, hypothyroidism, OSA on CPAP, osteoporosis, type 2 diabetes who presents to the ED due to left lower extremity swelling.   Upon work up patient underwent a CT venogram that demonstrated large occlusive thrombus extending from the left femoral vein just above the knee to the infrarenal IVC, nonocclusive thrombus of the right common femoral vein, right lower lobe segmental PE without right heart strain.  Patient is POD #3 from inferior venogram with mechanical thrombectomy of the left common femoral vein, left external iliac vein and inferior vena cava. Patient brother at the bedside. Endorses patient sleeping a lot post procedure. Patient easily aroused. Denies any pain to her lower extremities. Recovering as expected. Not ambulating with PT yet.  Vitals:   04/30/23 2240 05/01/23 0838  BP: 138/71 120/65  Pulse: (!) 110 84  Resp: 20 18  Temp: 97.8 F (36.6 C) 97.6 F (36.4 C)  SpO2: 96% 100%   Physical Exam: Cardiac: RRR, Normal S1, S2, No murmurs Lungs:  normal non-labored breathing, without Rales, rhonchi, wheezing  Incisions:  None Extremities:   without ischemic changes, without Gangrene , with cellulitis; without open wounds; Left lower extremity with +3 edema and swelling. Unable to palpate pulses in LLE Abdomen:  Positive bowel sounds throughout, soft, NT/ND, no masses  Neurologic: A&O X 3; No focal weakness or paresthesias are detected; speech is fluent/normal  CBC    Component Value Date/Time   WBC 6.3 04/29/2023 0239   RBC 3.13 (L) 04/29/2023 0239   HGB 9.5 (L) 04/29/2023 0239   HGB 12.5 07/06/2012 0431   HCT 29.1 (L) 04/29/2023 0239   HCT 42.2 06/18/2012 0917   PLT 186 04/29/2023 0239   PLT 150 07/06/2012 0431   MCV 93.0 04/29/2023 0239   MCV 89 06/18/2012 0917   MCH 30.4 04/29/2023 0239   MCHC 32.6  04/29/2023 0239   RDW 18.2 (H) 04/29/2023 0239   RDW 14.1 06/18/2012 0917   LYMPHSABS 1.6 04/26/2023 0310   MONOABS 0.5 04/26/2023 0310   EOSABS 0.3 04/26/2023 0310   BASOSABS 0.0 04/26/2023 0310    BMET    Component Value Date/Time   NA 134 (L) 04/30/2023 0338   NA 142 07/06/2012 0431   K 3.9 04/30/2023 0338   K 4.5 07/06/2012 0431   CL 101 04/30/2023 0338   CL 111 (H) 07/06/2012 0431   CO2 26 04/30/2023 0338   CO2 27 07/06/2012 0431   GLUCOSE 125 (H) 04/30/2023 0338   GLUCOSE 102 (H) 07/06/2012 0431   BUN 16 04/30/2023 0338   BUN 17 07/06/2012 0431   CREATININE 0.83 04/30/2023 0338   CREATININE 0.90 07/06/2012 0431   CALCIUM 9.2 04/30/2023 0338   CALCIUM 8.7 07/06/2012 0431   GFRNONAA >60 04/30/2023 0338   GFRNONAA 58 (L) 07/06/2012 0431   GFRAA >60 02/08/2017 0346   GFRAA >60 07/06/2012 0431    INR    Component Value Date/Time   INR 1.0 04/25/2023 1854   INR 0.9 06/18/2012 0917     Intake/Output Summary (Last 24 hours) at 05/01/2023 1137 Last data filed at 04/30/2023 1508 Gross per 24 hour  Intake --  Output 500 ml  Net -500 ml     Assessment/Plan:  89 y.o. female is s/p inferior venogram with mechanical thrombectomy of the left common femoral vein, left external iliac vein and  inferior vena cava. 3 Days Post-Op   PLAN: Okay to discharge home once medically stable and on oral anticoagulation. Continue to work with physical therapy and Occupational Therapy. Pain medication as needed. Diet as tolerated. Once discharged patient to follow-up in vein and vascular clinic in 1 month for bilateral lower extremity with IVC ultrasound.  DVT prophylaxis: Eliquis 10 mg twice daily.   Marcie Bal [facility] 05/01/2023 11:37 AM

## 2023-05-01 NOTE — Plan of Care (Signed)

## 2023-05-01 NOTE — TOC Progression Note (Signed)
Transition of Care [facility]) - Progression Note    Patient Details  Name: Cheryl Rios MRN: [id number] Date of Birth: [birth year]  Transition of Care [facility]) CM/SW Contact  Marlowe Sax, RN Phone Number: 05/01/2023, 10:06 AM  Clinical Narrative:    Met with the patient's brother and the patient in the room, she is on oxygen currently Her brother tells me that the patient has been at [employer] and since going home she has been followed by [facility] and has care givers thru [facility] 7 AM until 11 and again from 5 PM til 9, and family in between, [facility] is assisting them in finding long term placement He stated that they found one place in [place] and 1 in [place] however they are not happy with the one in [place], I explained it is my role to work out a disposition for DC when she is medically ready, he stated that until they can get long term then they will go home with [facility] services such as before,  He stated that she has a RW and a wc at home and a high rise toilet riser  He doesn't think she will need any additional DME   Expected Discharge Plan: Home w Hospice Care Barriers to Discharge: Continued Medical Work up  Expected Discharge Plan and Services   Discharge Planning Services: CM Consult   Living arrangements for the past 2 months: Single Family Home                 DME Arranged: N/A                     Social Determinants of Health (SDOH) Interventions SDOH Screenings   Food Insecurity: No Food Insecurity (04/26/2023)  Housing: Low Risk  (04/26/2023)  Transportation Needs: No Transportation Needs (04/26/2023)  Utilities: Not At Risk (04/26/2023)  Financial Resource Strain: Low Risk  (01/19/2023)   Received from [facility]  Tobacco Use: Low Risk  (04/25/2023)    Readmission Risk Interventions     No data to display

## 2023-05-02 DIAGNOSIS — I82413 Acute embolism and thrombosis of femoral vein, bilateral: Secondary | ICD-10-CM | POA: Diagnosis not present

## 2023-05-02 MED ORDER — APIXABAN 5 MG PO TABS: ORAL_TABLET | ORAL | Status: DC

## 2023-05-02 MED ORDER — APIXABAN (ELIQUIS) VTE STARTER PACK (10MG AND 5MG)
ORAL_TABLET | ORAL | 0 refills | Status: DC
Start: 2023-05-02 — End: 2024-02-05

## 2023-05-02 MED ORDER — APIXABAN 5 MG PO TABS: 5.0000 mg | ORAL_TABLET | Freq: Two times a day (BID) | ORAL | 5 refills | Status: AC

## 2023-05-02 MED ORDER — APIXABAN (ELIQUIS) VTE STARTER PACK (10MG AND 5MG): ORAL_TABLET | ORAL | 0 refills | Status: DC

## 2023-05-02 MED ORDER — APIXABAN 5 MG PO TABS: 5.0000 mg | ORAL_TABLET | Freq: Two times a day (BID) | ORAL | 5 refills | Status: DC

## 2023-05-02 NOTE — TOC Progression Note (Signed)
Transition of Care [facility]) - Progression Note    Patient Details  Name: Cheryl Rios MRN: [id number] Date of Birth: September 03, 1925  Transition [facility] Care [facility]) CM/SW Contact  Marlowe Sax, RN Phone Number: 05/02/2023, 11:30 AM  Clinical Narrative:     Met with the brother again, the patient doe snot need oxygen she is sating well on room air, he feels very confident about taking her home in a car, I advised against it and encouraged him to use EMS he declined, he stated they will have 3 people to assit her once home  Expected Discharge Plan: Home w Hospice Care Barriers to Discharge: Continued Medical Work up  Expected Discharge Plan and Services   Discharge Planning Services: CM Consult   Living arrangements for the past 2 months: Single Family Home Expected Discharge Date: 05/01/23               DME Arranged: N/A                     Social Determinants of Health (SDOH) Interventions SDOH Screenings   Food Insecurity: No Food Insecurity (04/26/2023)  Housing: Low Risk  (04/26/2023)  Transportation Needs: No Transportation Needs (04/26/2023)  Utilities: Not At Risk (04/26/2023)  Financial Resource Strain: Low Risk  (01/19/2023)   Received from [facility]  Tobacco Use: Low Risk  (04/25/2023)    Readmission Risk Interventions     No data to display

## 2023-05-02 NOTE — Plan of Care (Signed)

## 2023-05-02 NOTE — Discharge Summary (Signed)
Physician Discharge Summary   Cheryl Rios  female DOB: September 06, 1925  ZOX:[id number]  PCP: Jerl Mina, MD  Admit date: 04/25/2023 Discharge date: 05/02/2023  Admitted From: home Disposition:  home.  Pt was accepted at [facility], however, brother decided to take pt home with in-home care.  Home Health: Yes CODE STATUS: DNR  Discharge Instructions     Discharge instructions   Complete by: As directed    Vascular surgery has removed most of your blood clots.  Please take blood thinner Eliquis starter pack as directed for the first month, and then Eliquis 5 mg twice daily from 2nd month on for another 5 months.  Follow-up in vein and vascular clinic in 1 month.  Because your blood pressure has been low, please stop taking home Lasix and Lisinopril.  Continue wound care on your pressure ulcers. - -   Discharge wound care:   Complete by: As directed    Cleanse all buttock/back/hip wounds with water, pat dry Cover each one with single layer of xeroform gauze, top with foam Change every other day and as needed when soiled.  [facility] Course:  For full details, please see H&P, progress notes, consult notes and ancillary notes.  Briefly,  Cheryl Rios is a 89 y.o. female with medical history significant of CAD, CKD stage IIIa, hypertension, OSA on CPAP, type 2 diabetes who presented to the ED due to DVT.   Mrs. Lello states that she has been experiencing left lower extremity swelling for quite some time now.  She went to her PCP and was given antibiotics approximately 2 weeks ago without any improvement.  Her brother at bedside states that she has been improving how much she can ambulate after coming home from rehab but is still only able to take a few steps using a walker before she has to rest.  Due to this, she does have pressure ulcers that they have been working on improving.   * Acute deep vein thrombosis (DVT) of both lower extremities (HCC) s/p left  thrombectomy on 12/6 CT venogram showed "Large occlusive thrombus extending from the left femoral vein just above the knee into the infrarenal IVC. The proximal tip of the clot is at the level of an accessory left renal vein." Likely 2/2 hip fracture in Sep 2024, s/p surgery with associated increased sedentary lifestyle since.   --thrombectomy was successful, pt did not need IVC filter. --cont Eliquis   Pulmonary embolism on right (HCC) subsegmental PE without evidence of right heart strain.  Patient is not experiencing shortness of breath, and vital signs are stable with no tachycardia or hypoxia. --cont Eliquis   CKD stage 3a, GFR 45-59 ml/min (HCC) Renal function currently at baseline.   Hypothyroidism, unspecified --cont Synthroid   Type 2 diabetes mellitus without complications (HCC) Diet-controlled type 2 diabetes with last A1c of 6.2%.  No need for Accu-check.   Hypertension, not currently active - home lasix and Lisinopril d/c'ed due to soft BP.  Hypotension, resolved --improved with MIVF.   OSA on CPAP - CPAP at bedtime   Multiple pressure ulcers --wound care per order   Hyponatremia --mild, of unclear significance    Discharge Diagnoses:  Principal Problem:   Acute deep vein thrombosis (DVT) of both lower extremities (HCC) Active Problems:   Pulmonary embolism on right (HCC)   OSA on CPAP   Hypertension   Type 2 diabetes mellitus without complications (HCC)   Hypothyroidism, unspecified  CKD stage 3a, GFR 45-59 ml/min (HCC)   Acute deep vein thrombosis (DVT) of femoral vein of left lower extremity (HCC)   Pressure injury of skin   30 Day Unplanned Readmission Risk Score    Flowsheet Row ED to Hosp-Admission (Current) from 04/25/2023 in [facility] (1A)  30 Day Unplanned Readmission Risk Score (%) 12.92 Filed at 05/02/2023 0801       This score is the patient's risk of an unplanned readmission within 30 days of being  discharged (0 -100%). The score is based on dignosis, age, lab data, medications, orders, and past utilization.   Low:  0-14.9   Medium: 15-21.9   High: 22-29.9   Extreme: 30 and above         Discharge Instructions:  Allergies as of 05/02/2023   No Known Allergies      Medication List     STOP taking these medications    Accu-Chek Softclix Lancets lancets   furosemide 40 MG tablet Commonly known as: LASIX   glucose blood test strip   lisinopril 20 MG tablet Commonly known as: ZESTRIL   oxyCODONE-acetaminophen 5-325 MG tablet Commonly known as: PERCOCET/ROXICET       TAKE these medications    acetaminophen 500 MG tablet Commonly known as: TYLENOL Take 2 tablets (1,000 mg total) by mouth every 8 (eight) hours as needed for mild pain, fever or headache.   allopurinol 100 MG tablet Commonly known as: ZYLOPRIM Take 1.5 tablets by mouth daily.   ammonium lactate 12 % cream Commonly known as: AMLACTIN Apply 1 Application topically as needed for dry skin.   Apixaban Starter Pack (10mg  and 5mg ) Commonly known as: ELIQUIS STARTER PACK Take as directed on package: start with two-5mg  tablets twice daily for 7 days. On day 8, switch to one-5mg  tablet twice daily.   apixaban 5 MG Tabs tablet Commonly known as: ELIQUIS Take 1 tablet (5 mg total) by mouth 2 (two) times daily. Start taking on: June 02, 2023   DULoxetine 20 MG capsule Commonly known as: CYMBALTA Take 40 mg by mouth daily.   feeding supplement (GLUCERNA SHAKE) Liqd Take 237 mLs by mouth 3 (three) times daily between meals.   folic acid 1 MG tablet Commonly known as: FOLVITE Take 1 mg by mouth daily.   gabapentin 100 MG capsule Commonly known as: NEURONTIN Take 200 mg by mouth 3 (three) times daily.   ketoconazole 2 % cream Commonly known as: NIZORAL Apply 1 Application topically daily.   levothyroxine 75 MCG tablet Commonly known as: SYNTHROID TAKE 1 TABLET BY MOUTH ONCE DAILY ON EMPTY  STOMACH WITH WATER 30-60 MIN BEFORE BREAKFAST   multivitamin with minerals Tabs tablet Take 1 tablet by mouth daily.   nystatin cream Commonly known as: MYCOSTATIN Apply 1 Application topically 2 (two) times daily.               Discharge Care Instructions  (From admission, onward)           Start     Ordered   05/02/23 0000  Discharge wound care:       Comments: Cleanse all buttock/back/hip wounds with water, pat dry Cover each one with single layer of xeroform gauze, top with foam Change every other day and as needed when soiled.  - -   05/02/23 9629             Contact information for follow-up providers     Georgiana Spinner, NP Follow  up in 1 month(s).   Specialty: Vascular Surgery Why: IVC and Bilateral lower extremity Ultrasound for DVT evaluation Contact information: [address] [place] [address] [phone]         Jerl Mina, MD Follow up in 1 week(s).   Specialty: Family Medicine Contact information: [address] [place] [address] [phone]              Contact information for after-discharge care     Destination     [facility] .   Service: Skilled Nursing Contact information: [address] [place] [address] [phone]                     No Known Allergies   The results of significant diagnostics from this hospitalization (including imaging, microbiology, ancillary and laboratory) are listed below for reference.   Consultations:   Procedures/Studies: PERIPHERAL VASCULAR CATHETERIZATION  Result Date: 04/28/2023 See surgical note for result.  ECHOCARDIOGRAM COMPLETE  Result Date: 04/28/2023    ECHOCARDIOGRAM REPORT   Patient Name:   Cheryl Rios Date of Exam: 04/26/2023 Medical Rec #:  [id number]        Height:       61.0 in Accession #:    [id number]       Weight:       172.8 lb Date of Birth:  1925-07-16        BSA:          1.775 m  Patient Age:    89 years         BP:           97/48 mmHg Patient Gender: F                HR:           92 bpm. Exam Location:  [facility] Procedure: 2D Echo, Cardiac Doppler and Color Doppler Indications:     Pulmonary embolus  History:         Patient has prior history of Echocardiogram examinations, most                  recent 02/12/2023. Risk Factors:Hypertension, Diabetes,                  Dyslipidemia and Sleep Apnea. CKD, Technically difficult study.                  Patient is in a sitting position.  Sonographer:     Mikki Harbor Referring Phys:  [id number] Verdene Lennert Diagnosing Phys: Clotilde Dieter  Sonographer Comments: Technically difficult study due to poor echo windows and suboptimal apical window. Image acquisition challenging due to respiratory motion. IMPRESSIONS  1. Left ventricular ejection fraction, by estimation, is 60 to 65%. The left ventricle has normal function. The left ventricle has no regional wall motion abnormalities. Left ventricular diastolic parameters are consistent with Grade I diastolic dysfunction (impaired relaxation).  2. +McConnells sign consistent with PE. Right ventricular systolic function is mildly reduced. The right ventricular size is mildly enlarged. There is normal pulmonary artery systolic pressure. The estimated right ventricular systolic pressure is 24.5 mmHg.  3. The mitral valve is grossly normal. No evidence of mitral valve regurgitation. No evidence of mitral stenosis.  4. The aortic valve was not well visualized. Aortic valve regurgitation is not visualized. No aortic stenosis is present. FINDINGS  Left Ventricle: Left ventricular ejection fraction, by estimation, is 60 to 65%. The left  ventricle has normal function. The left ventricle has no regional wall motion abnormalities. The left ventricular internal cavity size was normal in size. There is  no left ventricular hypertrophy. Left ventricular diastolic parameters are consistent with Grade I diastolic  dysfunction (impaired relaxation). Right Ventricle: +McConnells sign consistent with PE. The right ventricular size is mildly enlarged. No increase in right ventricular wall thickness. Right ventricular systolic function is mildly reduced. There is normal pulmonary artery systolic pressure. The tricuspid regurgitant velocity is 2.32 m/s, and with an assumed right atrial pressure of 3 mmHg, the estimated right ventricular systolic pressure is 24.5 mmHg. Left Atrium: Left atrial size was normal in size. Right Atrium: Right atrial size was normal in size. Pericardium: There is no evidence of pericardial effusion. Mitral Valve: The mitral valve is grossly normal. No evidence of mitral valve regurgitation. No evidence of mitral valve stenosis. MV peak gradient, 4.2 mmHg. The mean mitral valve gradient is 2.0 mmHg. Tricuspid Valve: The tricuspid valve is not well visualized. Tricuspid valve regurgitation is trivial. Aortic Valve: The aortic valve was not well visualized. Aortic valve regurgitation is not visualized. No aortic stenosis is present. Aortic valve mean gradient measures 4.0 mmHg. Aortic valve peak gradient measures 8.2 mmHg. Aortic valve area, by VTI measures 2.26 cm. Pulmonic Valve: The pulmonic valve was not well visualized. Pulmonic valve regurgitation is not visualized. Aorta: The aortic root is normal in size and structure. IAS/Shunts: No atrial level shunt detected by color flow Doppler.  LEFT VENTRICLE PLAX 2D LVIDd:         4.60 cm   Diastology LVIDs:         2.90 cm   LV e' medial:    6.31 cm/s LV PW:         1.10 cm   LV E/e' medial:  12.4 LV IVS:        1.10 cm   LV e' lateral:   8.27 cm/s LVOT diam:     2.00 cm   LV E/e' lateral: 9.4 LV SV:         59 LV SV Index:   33 LVOT Area:     3.14 cm  RIGHT VENTRICLE RV Basal diam:  2.75 cm RV Mid diam:    2.80 cm LEFT ATRIUM             Index        RIGHT ATRIUM           Index LA diam:        3.30 cm 1.86 cm/m   RA Area:     13.80 cm LA Vol (A2C):    42.1 ml 23.72 ml/m  RA Volume:   30.50 ml  17.18 ml/m LA Vol (A4C):   18.5 ml 10.42 ml/m LA Biplane Vol: 28.3 ml 15.94 ml/m  AORTIC VALVE                    PULMONIC VALVE AV Area (Vmax):    2.44 cm     PV Vmax:       0.88 m/s AV Area (Vmean):   2.21 cm     PV Peak grad:  3.1 mmHg AV Area (VTI):     2.26 cm AV Vmax:           143.00 cm/s AV Vmean:          91.600 cm/s AV VTI:            0.260 m AV Peak  Grad:      8.2 mmHg AV Mean Grad:      4.0 mmHg LVOT Vmax:         111.00 cm/s LVOT Vmean:        64.400 cm/s LVOT VTI:          0.187 m LVOT/AV VTI ratio: 0.72  AORTA Ao Root diam: 3.50 cm MITRAL VALVE                TRICUSPID VALVE MV Area (PHT): 4.44 cm     TR Peak grad:   21.5 mmHg MV Area VTI:   2.61 cm     TR Vmax:        232.00 cm/s MV Peak grad:  4.2 mmHg MV Mean grad:  2.0 mmHg     SHUNTS MV Vmax:       1.03 m/s     Systemic VTI:  0.19 m MV Vmean:      57.9 cm/s    Systemic Diam: 2.00 cm MV Decel Time: 171 msec MV E velocity: 78.10 cm/s MV A velocity: 108.00 cm/s MV E/A ratio:  0.72 Sabina Custovic Electronically signed by Clotilde Dieter Signature Date/Time: 04/28/2023/12:57:48 PM    Final    CT VENOGRAM ABD/PELVIS/LOWER EXT BILAT  Result Date: 04/25/2023 CLINICAL DATA:  DVT throughout the entire left lower extremity. EXAM: CT VENOGRAM ABDOMEN AND PELVIS AND LOWER EXTREMITY BILATERAL TECHNIQUE: Venographic phase images of the abdomen, pelvis and lower extremities were obtained following the administration of intravenous contrast. Multiplanar reformats and maximum intensity projections were generated. RADIATION DOSE REDUCTION: This exam was performed according to the departmental dose-optimization program which includes automated exposure control, adjustment of the mA and/or kV according to patient size and/or use of iterative reconstruction technique. CONTRAST:  OMNIPAQUE IOHEXOL 350 MG/ML SOLN COMPARISON:  Left lower extremity ultrasound dated 04/25/2023. FINDINGS: Lower chest: The  visualized lung bases are clear. Right lower lobe segmental pulmonary artery embolus. The pulmonary arteries are only partially visualized on this CT of the abdomen pelvis. There is however no CT evidence of right heart straining. No intra-abdominal free air or free fluid. Hepatobiliary: The liver is unremarkable. No biliary dilatation. Cholecystectomy. No retained calcified stone noted in the central CBD. 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 No active inflammatory changes. No dilatation of the main pancreatic duct or gland atrophy. Spleen: Normal in size without focal abnormality. Adrenals/Urinary Tract: The adrenal glands are unremarkable. Small right renal cyst. There is no hydronephrosis on either side. There is symmetric enhancement of the kidneys bilaterally. The visualized ureters and urinary bladder appear unremarkable. Stomach/Bowel: Severe sigmoid diverticulosis and scattered colonic diverticula without active inflammatory changes. Apparent focal area of wall thickening and irregularity of the gastric antrum with a focus of air (28/7 and coronal 39/5) likely related to respiratory motion. A gastric ulcer is less likely but not excluded. There is no bowel obstruction. The appendix is not visualized with certainty. No inflammatory changes identified in the right lower quadrant. Vascular/Lymphatic: Advanced aortoiliac atherosclerotic disease. Large occlusive thrombus within the entire left femoral and deep femoral vein extending into the infrarenal IVC with proximal tip approximately 4.5 cm distal to the level of the main renal veins. There is however an accessory left renal vein at the level of the proximal tip of the thrombus extending from the IVC to the left main renal vein. Minimal peripheral and  nonocclusive thrombus noted in the right common femoral vein, age indeterminate. The SMV, splenic  vein, and main portal vein are patent. No portal venous gas. There is no adenopathy. Reproductive: Hysterectomy.  No suspicious adnexal masses. Other: Midline vertical anterior pelvic wall incisional scar. Musculoskeletal: Osteopenia with degenerative changes of the spine. Status post prior ORIF of left femoral neck comminuted fracture with nonunion at this time. Bilateral total knee arthroplasties. No acute osseous pathology. IMPRESSION: 1. Large occlusive thrombus extending from the left femoral vein just above the knee into the infrarenal IVC. The proximal tip of the clot is at the level of an accessory left renal vein. 2. Minimal peripheral and nonocclusive thrombus in the right common femoral vein, age indeterminate. 3. Right lower lobe segmental pulmonary artery embolus. No CT evidence of right heart straining. 4. Severe sigmoid diverticulosis. No bowel obstruction. 5.  Aortic Atherosclerosis (ICD10-I70.0). These results were called by telephone at the time of interpretation on 04/25/2023 at 9:09 pm to provider [facility] , who verbally acknowledged these results. Electronically Signed   By: Elgie Collard M.D.   On: 04/25/2023 21:11   US Venous Img Lower Unilateral Left (DVT)  Result Date: 04/25/2023 CLINICAL DATA:  left leg swelling EXAM: LEFT LOWER EXTREMITY VENOUS DOPPLER ULTRASOUND TECHNIQUE: Gray-scale sonography with compression, as well as color and duplex ultrasound, were performed to evaluate the deep venous system(s) from the level of the common femoral vein through the popliteal and proximal calf veins. COMPARISON:  LEFT lower extremity venous duplex, 02/21/2022. MRI abdomen, 02/06/2017. FINDINGS: VENOUS Heterogeneously echogenic, occlusive filling defect within and incompressibility of the imaged portions of the LEFT common femoral, superficial femoral, and popliteal veins, as well as the visualized calf veins. Thrombus extension into the visualized portions of profunda femoral vein and  great saphenous vein. Limited views of the contralateral common femoral vein are unremarkable. OTHER No abnormal fluid collection. Limitations: none IMPRESSION: Acute, extensive and occlusive DVT within the imaged LEFT lower extremity The central extension of the thrombus is not visualized on this evaluation. Recommend CT venogram abdomen pelvis to evaluate/rule out pelvic versus caval extension, and consider consultation by vascular interventional specialist for potential further evaluation and management. These results will be called to the ordering clinician or representative by the Radiologist Assistant, and communication documented in the PACS or [employer]. Roanna Banning, MD [facility] Electronically Signed   By: Roanna Banning M.D.   On: 04/25/2023 15:42      Labs: BNP (last 3 results) No results for input(s): "BNP" in the last 8760 hours. Basic Metabolic Panel: Recent Labs  Lab 04/26/23 0310 04/27/23 0442 04/28/23 0445 04/29/23 0239 04/30/23 0338  NA 132* 134* 134* 134* 134*  K 4.4 4.2 4.1 3.9 3.9  CL 97* 100 101 102 101  CO2 26 27 26 27 26   GLUCOSE 124* 117* 136* 125* 125*  BUN 26* 32* 25* 20 16  CREATININE 0.86 1.15* 0.96 0.81 0.83  CALCIUM 9.1 9.1 9.0 8.8* 9.2  MG  --  2.2 2.2 1.9 1.9   Liver Function Tests: Recent Labs  Lab 04/25/23 1738  AST 31  ALT 25  ALKPHOS 108  BILITOT 0.6  PROT 7.5  ALBUMIN 3.7   No results for input(s): "LIPASE", "AMYLASE" in the last 168 hours. No results for input(s): "AMMONIA" in the last 168 hours. CBC: Recent Labs  Lab 04/25/23 1738 04/26/23 0310 04/27/23 0442 04/28/23 0445 04/29/23 0239  WBC 6.6 5.4 6.9 6.9 6.3  NEUTROABS 4.1 2.9  --   --   --   HGB 13.0 10.7* 10.3* 10.6* 9.5*  HCT 41.0 33.4* 32.0* 33.4* 29.1*  MCV 95.3 94.4 95.0 96.0 93.0  PLT 254 214 179 191 186   Cardiac Enzymes: No results for input(s): "CKTOTAL", "CKMB", "CKMBINDEX", "TROPONINI" in the  last 168 hours. BNP: Invalid input(s): "POCBNP" CBG: Recent Labs  Lab 04/26/23 1253 04/26/23 1451 04/26/23 1641 04/28/23 1359 04/28/23 1545  GLUCAP 242* 141* 132* 110* 113*   D-Dimer No results for input(s): "DDIMER" in the last 72 hours. Hgb A1c No results for input(s): "HGBA1C" in the last 72 hours. Lipid Profile No results for input(s): "CHOL", "HDL", "LDLCALC", "TRIG", "CHOLHDL", "LDLDIRECT" in the last 72 hours. Thyroid function studies No results for input(s): "TSH", "T4TOTAL", "T3FREE", "THYROIDAB" in the last 72 hours.  Invalid input(s): "FREET3" Anemia work up No results for input(s): "VITAMINB12", "FOLATE", "FERRITIN", "TIBC", "IRON", "RETICCTPCT" in the last 72 hours. Urinalysis    Component Value Date/Time   COLORURINE AMBER (A) 02/04/2017 1145   APPEARANCEUR CLEAR (A) 02/04/2017 1145   APPEARANCEUR Turbid 06/18/2012 0900   LABSPEC 1.017 02/04/2017 1145   LABSPEC 1.020 06/18/2012 0900   PHURINE 5.0 02/04/2017 1145   GLUCOSEU NEGATIVE 02/04/2017 1145   GLUCOSEU Negative 06/18/2012 0900   HGBUR NEGATIVE 02/04/2017 1145   BILIRUBINUR NEGATIVE 02/04/2017 1145   BILIRUBINUR Negative 06/18/2012 0900   KETONESUR NEGATIVE 02/04/2017 1145   PROTEINUR NEGATIVE 02/04/2017 1145   NITRITE NEGATIVE 02/04/2017 1145   LEUKOCYTESUR NEGATIVE 02/04/2017 1145   LEUKOCYTESUR Trace 06/18/2012 0900   Sepsis Labs Recent Labs  Lab 04/26/23 0310 04/27/23 0442 04/28/23 0445 04/29/23 0239  WBC 5.4 6.9 6.9 6.3   Microbiology No results found for this or any previous visit (from the past 240 hour(s)).   Total time spend on discharging this patient, including the last patient exam, discussing the hospital stay, instructions for ongoing care as it relates to all pertinent caregivers, as well as preparing the medical discharge records, prescriptions, and/or referrals as applicable, is 40 minutes.    Darlin Priestly, MD  [facility] 05/02/2023, 9:07 AM

## 2023-05-02 NOTE — Plan of Care (Signed)

## 2023-05-02 NOTE — Progress Notes (Signed)
Physical Therapy Treatment Patient Details Name: Cheryl Rios MRN: [id number] DOB: November 21, 1925 Today's Date: 05/02/2023   History of Present Illness Pt is a 89 yo F presenting with LLE swelling, pleuritic chest pains, and hypoxia found with large acute DVT, now s/p peripheral vascular thrombectomy without stent. Current MD assessment includes CKD stage 3a, hypothyroidism, DM2, HTN, OSA, and pressure ulcers.    PT Comments  Pt resting in bed upon PT arrival; alert and agreeable to therapy; pt's brother present throughout session.  During session pt CGA semi-supine to sitting EOB (increased effort/time for pt to perform on own); mod assist x2 to stand from bed up to RW; and min assist x2 to ambulate a few feet bed to recliner with RW use.  Consistent assist and cueing required for upright balance/posture d/t posterior lean.  Discussed pt's current assist levels/needs with pt's brother and therapy recommendations (pt's brother requesting home discharge); TOC and MD updated on pt's status.   If plan is discharge home, recommend the following: Two people to help with walking and/or transfers;A lot of help with bathing/dressing/bathroom;Assistance with cooking/housework;Direct supervision/assist for medications management;Direct supervision/assist for financial management;Assist for transportation;Help with stairs or ramp for entrance   Can travel by private vehicle     No  Equipment Recommendations  Rolling walker (2 wheels);BSC/3in1;Wheelchair (measurements PT);Wheelchair cushion (measurements PT);Hospital bed;Hoyer lift    Recommendations for Other Services       Precautions / Restrictions Precautions Precautions: Fall Restrictions Weight Bearing Restrictions: No     Mobility  Bed Mobility Overal bed mobility: Needs Assistance Bed Mobility: Supine to Sit     Supine to sit: HOB elevated, Used rails, Contact guard     General bed mobility comments: increased effort/time to perform  on own    Transfers Overall transfer level: Needs assistance Equipment used: Rolling walker (2 wheels) Transfers: Sit to/from Stand Sit to Stand: Mod assist, +2 physical assistance           General transfer comment: assist to initiate and come to full stand; vc's for UE/LE placement; feet blocked to prevent from sliding forward; assist to control descent sitting    Ambulation/Gait Ambulation/Gait assistance: Min assist, +2 physical assistance Gait Distance (Feet): 3 Feet (bed to recliner) Assistive device: Rolling walker (2 wheels) Gait Pattern/deviations: Decreased step length - right, Decreased step length - left Gait velocity: decreased     General Gait Details: assist for standing balance (d/t posterior lean); vc's for taking steps; assist for walker management   Stairs             Wheelchair Mobility     Tilt Bed    Modified Rankin (Stroke Patients Only)       Balance Overall balance assessment: Needs assistance Sitting-balance support: Bilateral upper extremity supported, Feet supported Sitting balance-Leahy Scale: Fair Sitting balance - Comments: steady static sitting   Standing balance support: Bilateral upper extremity supported, Reliant on assistive device for balance Standing balance-Leahy Scale: Poor Standing balance comment: pt with posterior lean in standing requiring physical assist to maintain upright balance (pt unable to correct with cueing and unable to maintain upright position once corrected)                            Cognition Arousal: Alert Behavior During Therapy: Flat affect Overall Cognitive Status: History of cognitive impairments - at baseline  Exercises      General Comments  Nursing cleared pt for participation in physical therapy.  Pt agreeable to PT session.      Pertinent Vitals/Pain Pain Assessment Pain Assessment: Faces Faces Pain Scale: No  hurt Pain Intervention(s): Limited activity within patient's tolerance, Monitored during session, Repositioned Vitals (HR and SpO2 on room air) stable and WFL throughout treatment session.    Home Living                          Prior Function            PT Goals (current goals can now be found in the care plan section) Acute Rehab PT Goals Patient Stated Goal: get home PT Goal Formulation: With patient Time For Goal Achievement: 05/12/23 Potential to Achieve Goals: Fair Progress towards PT goals: Progressing toward goals    Frequency    Min 1X/week      PT Plan      Co-evaluation              AM-PAC PT "6 Clicks" Mobility   Outcome Measure  Help needed turning from your back to your side while in a flat bed without using bedrails?: A Little Help needed moving from lying on your back to sitting on the side of a flat bed without using bedrails?: A Little Help needed moving to and from a bed to a chair (including a wheelchair)?: Total Help needed standing up from a chair using your arms (e.g., wheelchair or bedside chair)?: Total Help needed to walk in hospital room?: Total Help needed climbing 3-5 steps with a railing? : Total 6 Click Score: 10    End of Session Equipment Utilized During Treatment: Gait belt Activity Tolerance: Patient limited by fatigue Patient left: in chair;with call bell/phone within reach;with chair alarm set Nurse Communication: Mobility status;Precautions PT Visit Diagnosis: Muscle weakness (generalized) (M62.81);Unsteadiness on feet (R26.81)     Time: 4098-1191 PT Time Calculation (min) (ACUTE ONLY): 20 min  Charges:    $Therapeutic Activity: 8-22 mins PT General Charges $$ ACUTE PT VISIT: 1 Visit                    Hendricks Limes, PT 05/02/23, 2:31 PM

## 2023-05-02 NOTE — Care Management Important Message (Signed)
Important Message  Patient Details  Name: Cheryl Rios MRN: [id number] Date of Birth: 10/19/1925   Important Message Given:  Yes - Medicare IM     [name] [name] 05/02/2023, 8:58 AM

## 2023-05-02 NOTE — Progress Notes (Signed)
Progress Note    05/02/2023 10:28 AM 4 Days Post-Op  Subjective:   This is a 89 y.o. female with medical history significant of CAD, CKD stage IIIa, hypertension, hypothyroidism, OSA on CPAP, osteoporosis, type 2 diabetes who presents to the ED due to left lower extremity swelling.   Upon work up patient underwent a CT venogram that demonstrated large occlusive thrombus extending from the left femoral vein just above the knee to the infrarenal IVC, nonocclusive thrombus of the right common femoral vein, right lower lobe segmental PE without right heart strain.   Patient is POD #4 from inferior venogram with mechanical thrombectomy of the left common femoral vein, left external iliac vein and inferior vena cava. Patient brother at the bedside. Endorses patient sleeping a lot post procedure. Patient easily aroused. Denies any pain to her lower extremities. Recovering as expected. Cheryl Rios patients brother has decided to take the patient home with hired care. He wishes Physical Therapy come see her before going home today.    Vitals:   05/01/23 2321 05/02/23 0812  BP: (!) 119/48 120/65  Pulse: 73 87  Resp: 18 17  Temp: 99 F (37.2 C) 97.8 F (36.6 C)  SpO2: 98% 97%   Physical Exam: Cardiac: RRR, Normal S1, S2, No murmurs Lungs:  normal non-labored breathing, without Rales, rhonchi, wheezing  Incisions:  None Extremities:   without ischemic changes, without Gangrene , with cellulitis; without open wounds; Left lower extremity with +3 edema and swelling. Unable to palpate pulses in LLE Abdomen:  Positive bowel sounds throughout, soft, NT/ND, no masses  Neurologic: A&O X 3; No focal weakness or paresthesias are detected; speech is fluent/normal  CBC    Component Value Date/Time   WBC 6.3 04/29/2023 0239   RBC 3.13 (L) 04/29/2023 0239   HGB 9.5 (L) 04/29/2023 0239   HGB 12.5 07/06/2012 0431   HCT 29.1 (L) 04/29/2023 0239   HCT 42.2 06/18/2012 0917   PLT 186 04/29/2023 0239   PLT  150 07/06/2012 0431   MCV 93.0 04/29/2023 0239   MCV 89 06/18/2012 0917   MCH 30.4 04/29/2023 0239   MCHC 32.6 04/29/2023 0239   RDW 18.2 (H) 04/29/2023 0239   RDW 14.1 06/18/2012 0917   LYMPHSABS 1.6 04/26/2023 0310   MONOABS 0.5 04/26/2023 0310   EOSABS 0.3 04/26/2023 0310   BASOSABS 0.0 04/26/2023 0310    BMET    Component Value Date/Time   NA 134 (L) 04/30/2023 0338   NA 142 07/06/2012 0431   K 3.9 04/30/2023 0338   K 4.5 07/06/2012 0431   CL 101 04/30/2023 0338   CL 111 (H) 07/06/2012 0431   CO2 26 04/30/2023 0338   CO2 27 07/06/2012 0431   GLUCOSE 125 (H) 04/30/2023 0338   GLUCOSE 102 (H) 07/06/2012 0431   BUN 16 04/30/2023 0338   BUN 17 07/06/2012 0431   CREATININE 0.83 04/30/2023 0338   CREATININE 0.90 07/06/2012 0431   CALCIUM 9.2 04/30/2023 0338   CALCIUM 8.7 07/06/2012 0431   GFRNONAA >60 04/30/2023 0338   GFRNONAA 58 (L) 07/06/2012 0431   GFRAA >60 02/08/2017 0346   GFRAA >60 07/06/2012 0431    INR    Component Value Date/Time   INR 1.0 04/25/2023 1854   INR 0.9 06/18/2012 0917    No intake or output data in the 24 hours ending 05/02/23 1028   Assessment/Plan:  89 y.o. female is s/p inferior venogram with mechanical thrombectomy of the left common femoral vein, left external  iliac vein and inferior vena cava.  4 Days Post-Op   PLAN: Okay to discharge home once medically stable and on oral anticoagulation. Continue to work with physical therapy and Occupational Therapy. Pain medication as needed. Diet as tolerated. Once discharged patient to follow-up in vein and vascular clinic in 1 month for bilateral lower extremity with IVC ultrasound.  DVT prophylaxis:  10 mg twice daily.    Cheryl Rios [facility] 05/02/2023 10:28 AM

## 2023-05-02 NOTE — TOC Progression Note (Signed)
Transition of Care [facility]) - Progression Note    Patient Details  Name: TORRI CARNLEY MRN: [id number] Date of Birth: [birth year]  [facility] CM/SW Contact  Marlowe Sax, RN Phone Number: 05/02/2023, 11:48 AM  Clinical Narrative:    Brother tells nurse he would like EMS transport to [address]  Called EMS to arrange   Expected Discharge Plan: Home w Hospice Care Barriers to Discharge: Continued Medical Work up  Expected Discharge Plan and Services   Discharge Planning Services: CM Consult   Living arrangements for the past 2 months: Single Family Home Expected Discharge Date: 05/01/23               DME Arranged: N/A                     Social Determinants of Health (SDOH) Interventions SDOH Screenings   Food Insecurity: No Food Insecurity (04/26/2023)  Housing: Low Risk  (04/26/2023)  Transportation Needs: No Transportation Needs (04/26/2023)  Utilities: Not At Risk (04/26/2023)  Financial Resource Strain: Low Risk  (01/19/2023)   Received from [facility]  Tobacco Use: Low Risk  (04/25/2023)    Readmission Risk Interventions     No data to display

## 2023-05-02 NOTE — TOC Progression Note (Signed)
Transition of Care [facility]) - Progression Note    Patient Details  Name: CLAUDEAN SIEMSEN MRN: [id number] Date of Birth: [birth year]  Transition of Care [facility]) CM/SW Contact  Marlowe Sax, RN Phone Number: 05/02/2023, 8:53 AM  Clinical Narrative:    Met with the patient's brother and he stated that he went to see Lacinda Axon and they will not be going there, they are planning to take her home with hired help as before with [facility], [facility] will continue to assist with placement and applying for Medicaid, he is requesting to have PT work with her again before going home so that they can get a good idea of physical needs   Expected Discharge Plan: Home w Hospice Care Barriers to Discharge: Continued Medical Work up  Expected Discharge Plan and Services   Discharge Planning Services: CM Consult   Living arrangements for the past 2 months: Single Family Home Expected Discharge Date: 05/01/23               DME Arranged: N/A                     Social Determinants of Health (SDOH) Interventions SDOH Screenings   Food Insecurity: No Food Insecurity (04/26/2023)  Housing: Low Risk  (04/26/2023)  Transportation Needs: No Transportation Needs (04/26/2023)  Utilities: Not At Risk (04/26/2023)  Financial Resource Strain: Low Risk  (01/19/2023)   [facility]  Tobacco Use: Low Risk  (04/25/2023)    Readmission Risk Interventions     No data to display

## 2023-05-19 ENCOUNTER — Encounter: Payer: MEDICARE | Attending: Internal Medicine | Admitting: Internal Medicine

## 2023-05-19 DIAGNOSIS — L89312 Pressure ulcer of right buttock, stage 2: Secondary | ICD-10-CM | POA: Diagnosis present

## 2023-05-19 DIAGNOSIS — S60511D Abrasion of right hand, subsequent encounter: Secondary | ICD-10-CM | POA: Diagnosis not present

## 2023-05-19 DIAGNOSIS — X58XXXD Exposure to other specified factors, subsequent encounter: Secondary | ICD-10-CM | POA: Diagnosis not present

## 2023-05-19 DIAGNOSIS — Z86718 Personal history of other venous thrombosis and embolism: Secondary | ICD-10-CM | POA: Insufficient documentation

## 2023-05-19 DIAGNOSIS — Z7901 Long term (current) use of anticoagulants: Secondary | ICD-10-CM | POA: Insufficient documentation

## 2023-05-19 NOTE — Progress Notes (Signed)
JAMEAH, ROUBIDEAUX ([id number]) [phone] [id number] Page 1 of 5 Visit Report for 05/19/2023 Abuse Risk Screen Details Patient Name: Date of Service: Cheryl Martinet, DO RIS M. 05/19/2023 10:00 A M Medical Record Number: [id number] Patient Account Number: [id number] Date of Birth/Sex: Treating RN: [birth year] (89 y.o. Cheryl Rios Primary Care Emrick Hensch: Jerl Mina Other Clinician: Referring Rafaelita Foister: Treating Saul Fabiano/Extender: RO BSO N, MICHA EL Randol Kern in Treatment: 0 Abuse Risk Screen Items Answer ABUSE RISK SCREEN: Has anyone close to you tried to hurt or harm you recentlyo No Do you feel uncomfortable with anyone in your familyo No Has anyone forced you do things that you didnt want to doo No Electronic Signature(s) Signed: 05/19/2023 12:38:04 PM By: Angelina Pih Entered By: Angelina Pih on 05/19/2023 07:17:09 -------------------------------------------------------------------------------- Activities of Daily Living Details Patient Name: Date of Service: Cheryl Martinet, DO RIS M. 05/19/2023 10:00 A M Medical Record Number: [id number] Patient Account Number: [id number] Date of Birth/Sex: Treating RN: 09/07/1925 (89 y.o. Cheryl Rios Primary Care Rainna Nearhood: Jerl Mina Other Clinician: Referring Troi Bechtold: Treating Saturnino Liew/Extender: RO BSO N, MICHA EL Randol Kern in Treatment: 0 Activities of Daily Living Items Answer Activities of Daily Living (Please select one for each item) Drive Automobile Not Able T Medications ake Need Assistance Use T elephone Need Assistance Care for Appearance Need Assistance Use T oilet Need Assistance Bath / Shower Need Assistance Dress Self Need Assistance Feed Self Need Assistance Walk Need Assistance Get In / Out Bed Need Assistance Housework Need Assistance Cheryl Rios, Cheryl Rios [place] ([id number]) [id number] Page 2 of 5 Prepare Meals Need  Assistance Handle Money Need Assistance Shop for Self Need Assistance Electronic Signature(s) Signed: 05/19/2023 12:38:04 PM By: Angelina Pih Entered By: Angelina Pih on 05/19/2023 07:17:57 -------------------------------------------------------------------------------- Education Screening Details Patient Name: Date of Service: Cheryl Martinet, DO RIS M. 05/19/2023 10:00 A M Medical Record Number: [id number] Patient Account Number: [id number] Date of Birth/Sex: Treating RN: 23-Oct-1925 (89 y.o. Cheryl Rios Primary Care Dalexa Gentz: Jerl Mina Other Clinician: Referring Raven Harmes: Treating Eun Vermeer/Extender: RO BSO Dorris Carnes, MICHA EL Randol Kern in Treatment: 0 Primary Learner Assessed: Patient Learning Preferences/Education Level/Primary Language Learning Preference: Explanation, Demonstration, Video, Communication Board, Printed Material Preferred Language: English Cognitive Barrier Language Barrier: No Translator Needed: No Memory Deficit: No Emotional Barrier: No Cultural/Religious Beliefs Affecting Medical Care: No Physical Barrier Impaired Vision: No Impaired Hearing: Yes Decreased Hand dexterity: No Knowledge/Comprehension Knowledge Level: High Comprehension Level: High Ability to understand written instructions: High Ability to understand verbal instructions: High Motivation Anxiety Level: Calm Cooperation: Cooperative Education Importance: Acknowledges Need Interest in Health Problems: Asks Questions Perception: Coherent Willingness to Engage in Self-Management High Activities: Readiness to Engage in Self-Management High Activities: Electronic Signature(s) Signed: 05/19/2023 12:38:04 PM By: Angelina Pih Entered By: Angelina Pih on 05/19/2023 07:18:19 Cheryl Rios ([id number]) [id number] Page 3 of 5 -------------------------------------------------------------------------------- Fall Risk Assessment  Details Patient Name: Date of Service: Cheryl Martinet, DO RIS M. 05/19/2023 10:00 A M Medical Record Number: [id number] Patient Account Number: [id number] Date of Birth/Sex: Treating RN: 1925-10-30 (89 y.o. Cheryl Rios Primary Care Havier Deeb: Jerl Mina Other Clinician: Referring Shaira Sova: Treating Colin Norment/Extender: RO BSO N, MICHA EL Randol Kern in Treatment: 0 Fall Risk Assessment Items Have you had 2 or more falls in the last 12 monthso 0 No Have you had any fall that resulted in injury in the last 12 monthso 0 Yes FALLS RISK SCREEN History of falling - immediate or  within 3 months 0 No Secondary diagnosis (Do you have 2 or more medical diagnoseso) 0 No Ambulatory aid None/bed rest/wheelchair/nurse 0 No Crutches/cane/walker 15 Yes Furniture 0 No Intravenous therapy Access/Saline/Heparin Lock 0 No Gait/Transferring Normal/ bed rest/ wheelchair 0 No Weak (short steps with or without shuffle, stooped but able to lift head while walking, may seek 10 Yes support from furniture) Impaired (short steps with shuffle, may have difficulty arising from chair, head down, impaired 0 No balance) Mental Status Oriented to own ability 0 Yes Electronic Signature(s) Signed: 05/19/2023 12:38:04 PM By: Angelina Pih Entered By: Angelina Pih on 05/19/2023 07:18:43 -------------------------------------------------------------------------------- Foot Assessment Details Patient Name: Date of Service: Cheryl Martinet, DO RIS M. 05/19/2023 10:00 A M Medical Record Number: [id number] Patient Account Number: [id number] Date of Birth/Sex: Treating RN: 09/12/1925 (89 y.o. Cheryl Rios Primary Care Maliki Gignac: Jerl Mina Other Clinician: Referring Biagio Snelson: Treating Jacere Pangborn/Extender: RO BSO Dorris Carnes, MICHA EL Randol Kern in Treatment: 0 Foot Assessment Items Site Locations [place], [place] ([id number]) [id number] Page 4 of 5 + =  Sensation present, - = Sensation absent, C = Callus, U = Ulcer R = Redness, W = Warmth, M = Maceration, PU = Pre-ulcerative lesion F = Fissure, S = Swelling, D = Dryness Assessment Right: Left: Other Deformity: No No Prior Foot Ulcer: No No Prior Amputation: No No Charcot Joint: No No Ambulatory Status: Ambulatory With Help Assistance Device: Walker Gait: Unsteady Notes no wounds on feet or lower extremities Electronic Signature(s) Signed: 05/19/2023 12:38:04 PM By: Angelina Pih Entered By: Angelina Pih on 05/19/2023 07:19:10 -------------------------------------------------------------------------------- Nutrition Risk Screening Details Patient Name: Date of Service: Cheryl Martinet, DO RIS M. 05/19/2023 10:00 A M Medical Record Number: [id number] Patient Account Number: [id number] Date of Birth/Sex: Treating RN: 05-Oct-1925 (89 y.o. Cheryl Rios Primary Care Indra Wolters: Jerl Mina Other Clinician: Referring Merric Yost: Treating Socorro Kanitz/Extender: RO BSO N, MICHA EL Randol Kern in Treatment: 0 Height (in): 61 Weight (lbs): 175 Body Mass Index (BMI): 33.1 Nutrition Risk Screening Items Score Screening NUTRITION RISK SCREEN: I have an illness or condition that made me change the kind and/or amount of food I eat 0 No Cheryl Rios, Cheryl Rios ([id number]) [id number] Page 5 of 5 I eat fewer than two meals per day 0 No I eat few fruits and vegetables, or milk products 0 No I have three or more drinks of beer, liquor or wine almost every day 0 No I have tooth or mouth problems that make it hard for me to eat 0 No I don't always have enough money to buy the food I need 0 No I eat alone most of the time 0 No I take three or more different prescribed or over-the-counter drugs a day 0 No Without wanting to, I have lost or gained 10 pounds in the last six months 0 No I am not always physically able to shop, cook and/or feed myself 0  No Nutrition Protocols Good Risk Protocol 0 No interventions needed Moderate Risk Protocol High Risk Proctocol Risk Level: Good Risk Score: 0 Electronic Signature(s) Signed: 05/19/2023 12:38:04 PM By: Angelina Pih Entered By: Angelina Pih on 05/19/2023 07:18:49

## 2023-05-22 NOTE — Progress Notes (Signed)
EMALYNE, BRECEDA ([id number]) [id number] Page 1 of 10 Visit Report for 05/19/2023 Chief Complaint Document Details Patient Name: Date of Service: Cheryl Martinet, DO RIS M. 05/19/2023 10:00 A M Medical Record Number: [id number] Patient Account Number: [id number] Date of Birth/Sex: Treating RN: November 13, 1925 (89 y.o. [name] Primary Care Provider: Jerl Mina Other Clinician: Referring Provider: Treating Provider/Extender: RO BSO Dorris Carnes, MICHA EL Randol Kern in Treatment: 0 Information Obtained from: Patient Chief Complaint 05/19/2023; patient arrives for review of separate wound issues on the right buttock and a more recent abrasion injury on the right dorsal hand Electronic Signature(s) Signed: 05/22/2023 4:31:08 PM By: Baltazar Najjar MD Entered By: Baltazar Najjar on 05/19/2023 08:12:38 -------------------------------------------------------------------------------- Debridement Details Patient Name: Date of Service: Cheryl Martinet, DO RIS M. 05/19/2023 10:00 A M Medical Record Number: [id number] Patient Account Number: [id number] Date of Birth/Sex: Treating RN: 1926/05/19 (89 y.o. [name] Primary Care Provider: Jerl Mina Other Clinician: Referring Provider: Treating Provider/Extender: RO BSO Dorris Carnes, MICHA EL Randol Kern in Treatment: 0 Debridement Performed for Assessment: Wound #2 Right Gluteus Performed By: Physician Maxwell Caul, MD The following information was scribed by: Angelina Pih The information was scribed for: Baltazar Najjar G Debridement Type: Debridement Level of Consciousness (Pre-procedure): Awake and Alert Pre-procedure Verification/Time Out Yes - 10:55 Taken: Pain Control: Lidocaine 4% T opical Solution Percent of Wound Bed Debrided: 100% T Area Debrided (cm): otal 0.31 Tissue and other material debrided: Viable, Non-Viable, Slough, Subcutaneous, Slough Level: Skin/Subcutaneous  Tissue Debridement Description: Excisional Instrument: Curette Bleeding: Minimum Hemostasis Achieved: Pressure Response to Treatment: Procedure was tolerated well Level of Consciousness UNIKA, HOLDCROFT ([id number]) [id number] Page 2 of 10 Level of Consciousness (Post- Awake and Alert procedure): Post Debridement Measurements of Total Wound Length: (cm) 0.8 Stage: Category/Stage III Width: (cm) 0.5 Depth: (cm) 0.1 Volume: (cm) 0.031 Character of Wound/Ulcer Post Debridement: Stable Post Procedure Diagnosis Same as Pre-procedure Electronic Signature(s) Signed: 05/19/2023 11:27:06 AM By: Angelina Pih Signed: 05/22/2023 4:31:08 PM By: Baltazar Najjar MD Entered By: Angelina Pih on 05/19/2023 08:27:06 -------------------------------------------------------------------------------- HPI Details Patient Name: Date of Service: Cheryl Martinet, DO RIS M. 05/19/2023 10:00 A M Medical Record Number: [id number] Patient Account Number: [id number] Date of Birth/Sex: Treating RN: August 19, 1925 (89 y.o. [name] Primary Care Provider: Jerl Mina Other Clinician: Referring Provider: Treating Provider/Extender: RO BSO N, MICHA EL Randol Kern in Treatment: 0 History of Present Illness HPI Description: ADMISSION 05/18/2024 This is a a somewhat frail elderly woman who lives in her own home cared for predominantly by her brother. It seems that her most recent problems began on 02/10/2023 with a left hip fracture. Subsequently she developed a acute DVT ultimately requiring a thrombectomy of the left femoral vein, left external iliac and left common iliac veins, as well as the inferior vena cava. This was all done for a CT venogram that demonstrated large occlusive thrombus extending from the left femoral vein just above the knee to the infrarenal IVC, nonocclusive thrombus of the right common femoral vein and the right lower segment lung  PE. She is now on Eliquis. She spent some time in rehabilitation and in that timeframe developed a pressure ulcer on her right buttock. There are using silver alginate and bordered foam. Now that she is at home she has [facility] home care. More recently she traumatized her right dorsal hand on a wheelchair. It sounds as though this was initially a hematoma. With some portion of the  surface epithelium did not hold. Past medical history includes hypothyroidism, type 2 diabetes, left hip fracture, exceptionally large left-sided DVT as noted above, now on Eliquis, gout. The patient is now at home she walks with a walker apparently sleeps over the recliner. They have some form of pressure relief cushion on the recliner. Apparently she sleeps in a recliner because of back pain Electronic Signature(s) Signed: 05/22/2023 4:31:08 PM By: Baltazar Najjar MD Entered By: Baltazar Najjar on 05/19/2023 08:18:34 Rickard Rhymes ([id number]) [id number] Page 3 of 10 -------------------------------------------------------------------------------- Physical Exam Details Patient Name: Date of Service: Cheryl Martinet, DO RIS M. 05/19/2023 10:00 A M Medical Record Number: [id number] Patient Account Number: [id number] Date of Birth/Sex: Treating RN: 06-01-1925 (89 y.o. [name] Primary Care Provider: Jerl Mina Other Clinician: Referring Provider: Treating Provider/Extender: RO BSO N, MICHA EL Randol Kern in Treatment: 0 Constitutional Sitting or standing Blood Pressure is within target range for patient.. Slightly tachycardic. appears in no distress. Respiratory Respiratory effort is easy and symmetric bilaterally. Rate is normal at rest and on room air.. Notes Wound exam Right buttock. Small stage II pressure ulcer. I 100% covered with a fibrinous surface I used a #3 curette to remove this hemostasis with direct pressure. There is no tunneling area. I was also  concerned about the firmness of the tissue around this. I do not believe this is an abscess or an infection. It is nontender. Right dorsal hand. Clean abrasion type injury which I think might of been in the setting of a hematoma originally. Below the open area the skin looks somewhat threatened but given the history I think this should stay intact. I did not remove this. Electronic Signature(s) Signed: 05/22/2023 4:31:08 PM By: Baltazar Najjar MD Entered By: Baltazar Najjar on 05/19/2023 02:72:53 -------------------------------------------------------------------------------- Physician Orders Details Patient Name: Date of Service: Cheryl Martinet, DO RIS M. 05/19/2023 10:00 A M Medical Record Number: [id number] Patient Account Number: [id number] Date of Birth/Sex: Treating RN: [birth year] (89 y.o. [name] Primary Care Provider: Jerl Mina Other Clinician: Referring Provider: Treating Provider/Extender: RO BSO Dorris Carnes, MICHA EL Randol Kern in Treatment: 0 The following information was scribed by: Angelina Pih The information was scribed for: Maxwell Caul Verbal / Phone Orders: No Diagnosis Coding Follow-up Appointments Return Appointment in 2 weeks. Home Health Home Health Company: - [facility] for wound care. May utilize formulary equivalent dressing for wound treatment orders unless otherwise specified. Home Health Nurse may visit PRN to address patients wound care needs. Scheduled days for dressing changes to be completed; exception, patient has scheduled wound care visit that day. **Please direct any NON-WOUND related issues/requests for orders to patient's Primary Care Physician. **If current dressing causes HERMALINDA, ERTEL ([id number]) [id number] Page 4 of 10 regression in wound condition, may D/C ordered dressing product/s and apply Normal Saline Moist Dressing daily until next Wound Healing Center or Other MD  appointment. **Notify Wound Healing Center of regression in wound condition at [phone]. Bathing/ Shower/ Hygiene May shower with wound dressing protected with water repellent cover or cast protector. No tub bath. Anesthetic (Use 'Patient Medications' Section for Anesthetic Order Entry) Lidocaine applied to wound bed Off-Loading Turn and reposition every 2 hours - try not to sleep in recliner Wound Treatment Wound #1 - Hand - Dorsum Wound Laterality: Right Cleanser: Soap and Water 1 x Per Day/30 Days Discharge Instructions: Gently cleanse wound with antibacterial soap, rinse and pat dry prior to dressing wounds Cleanser:  Wound Cleanser 1 x Per Day/30 Days Discharge Instructions: Wash your hands with soap and water. Remove old dressing, discard into plastic bag and place into trash. Cleanse the wound with Wound Cleanser prior to applying a clean dressing using gauze sponges, not tissues or cotton balls. Do not scrub or use excessive force. Pat dry using gauze sponges, not tissue or cotton balls. Prim Dressing: Xeroform 4x4-HBD (in/in) 1 x Per Day/30 Days ary Discharge Instructions: DOUBLE LAYER Apply Xeroform 4x4-HBD (in/in) as directed Secondary Dressing: Gauze 1 x Per Day/30 Days Discharge Instructions: As directed: dry Secured With: Kerlix Roll Sterile or Non-Sterile 6-ply 4.5x4 (yd/yd) 1 x Per Day/30 Days Discharge Instructions: Apply Kerlix as directed Secured With: Stretch Net Dressing, Latex-free, Size 5, Small-Head / Shoulder / Thigh 1 x Per Day/30 Days Wound #2 - Gluteus Wound Laterality: Right Cleanser: Soap and Water 3 x Per Week/30 Days Discharge Instructions: Gently cleanse wound with antibacterial soap, rinse and pat dry prior to dressing wounds Cleanser: Wound Cleanser 3 x Per Week/30 Days Discharge Instructions: Wash your hands with soap and water. Remove old dressing, discard into plastic bag and place into trash. Cleanse the wound with Wound Cleanser prior to  applying a clean dressing using gauze sponges, not tissues or cotton balls. Do not scrub or use excessive force. Pat dry using gauze sponges, not tissue or cotton balls. Prim Dressing: Prisma 4.34 (in) 3 x Per Week/30 Days ary Discharge Instructions: Moisten w surgi lube Secondary Dressing: (BORDER) Zetuvit Plus SILICONE BORDER Dressing 4x4 (in/in) 3 x Per Week/30 Days Discharge Instructions: Please do not put silicone bordered dressings under wraps. Use non-bordered dressing only. Electronic Signature(s) Signed: 05/19/2023 12:38:04 PM By: Angelina Pih Signed: 05/22/2023 4:31:08 PM By: Baltazar Najjar MD Entered By: Angelina Pih on 05/19/2023 08:27:44 -------------------------------------------------------------------------------- Problem List Details Patient Name: Date of Service: Cheryl Martinet, DO RIS M. 05/19/2023 10:00 A M Medical Record Number: [id number] Patient Account Number: [id number] Date of Birth/Sex: Treating RN: [birth year] (89 y.o. [name] Primary Care Provider: Jerl Mina Other Clinician: Referring Provider: Treating Provider/Extender: Chauncey Mann, MICHA EL Randol Kern in Treatment: 0 TELECIA, HINA ([id number]) [id number] Page 5 of 10 Active Problems ICD-10 Encounter Code Description Active Date MDM Diagnosis L89.312 Pressure ulcer of right buttock, stage 2 05/19/2023 No Yes S60.511D Abrasion of right hand, subsequent encounter 05/19/2023 No Yes Inactive Problems Resolved Problems Electronic Signature(s) Signed: 05/22/2023 4:31:08 PM By: Baltazar Najjar MD Entered By: Baltazar Najjar on 05/19/2023 08:11:56 -------------------------------------------------------------------------------- Progress Note Details Patient Name: Date of Service: Cheryl Martinet, DO RIS M. 05/19/2023 10:00 A M Medical Record Number: [id number] Patient Account Number: [id number] Date of Birth/Sex: Treating RN: Jun 07, 1925 (89 y.o. [name] Primary Care Provider: Jerl Mina Other Clinician: Referring Provider: Treating Provider/Extender: Chauncey Mann, MICHA EL Randol Kern in Treatment: 0 Subjective Chief Complaint Information obtained from Patient 05/19/2023; patient arrives for review of separate wound issues on the right buttock and a more recent abrasion injury on the right dorsal hand History of Present Illness (HPI) ADMISSION 05/18/2024 This is a a somewhat frail elderly woman who lives in her own home cared for predominantly by her brother. It seems that her most recent problems began on 02/10/2023 with a left hip fracture. Subsequently she developed a acute DVT ultimately requiring a thrombectomy of the left femoral vein, left external iliac and left common iliac veins, as well as the inferior vena cava. This was all done for a CT  venogram that demonstrated large occlusive thrombus extending from the left femoral vein just above the knee to the infrarenal IVC, nonocclusive thrombus of the right common femoral vein and the right lower segment lung PE. She is now on Eliquis. She spent some time in rehabilitation and in that timeframe developed a pressure ulcer on her right buttock. There are using silver alginate and bordered foam. Now that she is at home she has [facility] home care. More recently she traumatized her right dorsal hand on a wheelchair. It sounds as though this was initially a hematoma. With some portion of the surface epithelium did not hold. Past medical history includes hypothyroidism, type 2 diabetes, left hip fracture, exceptionally large left-sided DVT as noted above, now on Eliquis, gout. The patient is now at home she walks with a walker apparently sleeps over the recliner. They have some form of pressure relief cushion on the recliner. Apparently she sleeps in a recliner because of back pain Patient History Information obtained from Patient, Chart. Allergies No Known Drug  Allergies Social History NOLLIE, LUJAN ([id number]) [id number] Page 6 of 10 Never smoker, Alcohol Use - Never, Drug Use - No History, Caffeine Use - Daily - tea diet coke. Medical History Respiratory Patient has history of Sleep Apnea Cardiovascular Patient has history of Deep Vein Thrombosis, Hypertension Endocrine Patient has history of Type II Diabetes Integumentary (Skin) Patient has history of History of pressure wounds Musculoskeletal Patient has history of Gout, Rheumatoid Arthritis, Osteoarthritis Patient is treated with Controlled Diet. Blood sugar is not tested. Review of Systems (ROS) Constitutional Symptoms (General Health) Denies complaints or symptoms of Fatigue, Fever, Chills, Marked Weight Change. Eyes Denies complaints or symptoms of Dry Eyes, Vision Changes, Glasses / Contacts. Ear/Nose/Mouth/Throat Denies complaints or symptoms of Difficult clearing ears, Sinusitis. Hematologic/Lymphatic Complains or has symptoms of Bleeding / Clotting Disorders, recent DVT in Eliquis Gastrointestinal Denies complaints or symptoms of Frequent diarrhea, Nausea, Vomiting. Endocrine Complains or has symptoms of Thyroid disease - hypo. Genitourinary CKD stage 3a Immunological Denies complaints or symptoms of Hives, Itching. Integumentary (Skin) Complains or has symptoms of Wounds. Neurologic Denies complaints or symptoms of Numbness/parasthesias, Focal/Weakness. Psychiatric Denies complaints or symptoms of Anxiety, Claustrophobia. Objective Constitutional Sitting or standing Blood Pressure is within target range for patient.. Slightly tachycardic. appears in no distress. Vitals Time Taken: 10:13 AM, Height: 61 in, Source: Stated, Weight: 175 lbs, Source: Stated, BMI: 33.1, Temperature: 97.6 F, Pulse: 105 bpm, Respiratory Rate: 18 breaths/min, Blood Pressure: 146/83 mmHg. Respiratory Respiratory effort is easy and symmetric bilaterally. Rate  is normal at rest and on room air.. General Notes: Wound exam  Right buttock. Small stage II pressure ulcer. I 100% covered with a fibrinous surface I used a #3 curette to remove this hemostasis with direct pressure. There is no tunneling area. I was also concerned about the firmness of the tissue around this. I do not believe this is an abscess or an infection. It is nontender.  Right dorsal hand. Clean abrasion type injury which I think might of been in the setting of a hematoma originally. Below the open area the skin looks somewhat threatened but given the history I think this should stay intact. I did not remove this. Integumentary (Hair, Skin) Wound #1 status is Open. Original cause of wound was Skin T ear/Laceration. The date acquired was: 05/16/2023. The wound is located on the Right Hand - Dorsum. The wound measures 3.2cm length x 2.5cm width x 0.1cm depth; 6.283cm^2 area and 0.628cm^3 volume. There  is Fat Layer (Subcutaneous Tissue) exposed. There is no tunneling or undermining noted. There is a medium amount of serosanguineous drainage noted. There is large (67-100%) red, pink granulation within the wound bed. There is no necrotic tissue within the wound bed. Wound #2 status is Open. Original cause of wound was Pressure Injury. The date acquired was: 04/05/2023. The wound is located on the Right Gluteus. The wound measures 0.8cm length x 0.5cm width x 0.1cm depth; 0.314cm^2 area and 0.031cm^3 volume. There is Fat Layer (Subcutaneous Tissue) exposed. There is no tunneling or undermining noted. There is a medium amount of serosanguineous drainage noted. There is small (1-33%) red granulation within the wound bed. There is a large (67-100%) amount of necrotic tissue within the wound bed including Adherent Slough. Assessment Active Problems ICD-10 Pressure ulcer of right buttock, stage 2 Abrasion of right hand, subsequent encounter SEQUENA, ZEIDAN ([id number])  [phone].pdf Page 7 of 10 Plan Follow-up Appointments: Return Appointment in 2 weeks. Home Health: Home Health Company: - [facility] for wound care. May utilize formulary equivalent dressing for wound treatment orders unless otherwise specified. Home Health Nurse may visit PRN to address patients wound care needs. Scheduled days for dressing changes to be completed; exception, patient has scheduled wound care visit that day. **Please direct any NON-WOUND related issues/requests for orders to patient's Primary Care Physician. **If current dressing causes regression in wound condition, may D/C ordered dressing product/s and apply Normal Saline Moist Dressing daily until next Wound Healing Center or Other MD appointment. **Notify Wound Healing Center of regression in wound condition at [phone]. Bathing/ Shower/ Hygiene: May shower with wound dressing protected with water repellent cover or cast protector. No tub bath. Anesthetic (Use 'Patient Medications' Section for Anesthetic Order Entry): Lidocaine applied to wound bed Off-Loading: Turn and reposition every 2 hours - try not to sleep in recliner WOUND #1: - Hand - Dorsum Wound Laterality: Right Cleanser: Soap and Water 1 x Per Day/30 Days Discharge Instructions: Gently cleanse wound with antibacterial soap, rinse and pat dry prior to dressing wounds Cleanser: Wound Cleanser 1 x Per Day/30 Days Discharge Instructions: Wash your hands with soap and water. Remove old dressing, discard into plastic bag and place into trash. Cleanse the wound with Wound Cleanser prior to applying a clean dressing using gauze sponges, not tissues or cotton balls. Do not scrub or use excessive force. Pat dry using gauze sponges, not tissue or cotton balls. Prim Dressing: Xeroform 4x4-HBD (in/in) 1 x Per Day/30 Days ary Discharge Instructions: DOUBLE LAYER Apply Xeroform 4x4-HBD (in/in) as directed Secondary  Dressing: Gauze 1 x Per Day/30 Days Discharge Instructions: As directed: dry Secured With: Kerlix Roll Sterile or Non-Sterile 6-ply 4.5x4 (yd/yd) 1 x Per Day/30 Days Discharge Instructions: Apply Kerlix as directed Secured With: Stretch Net Dressing, Latex-free, Size 5, Small-Head / Shoulder / Thigh 1 x Per Day/30 Days WOUND #2: - Gluteus Wound Laterality: Right Cleanser: Soap and Water 3 x Per Week/30 Days Discharge Instructions: Gently cleanse wound with antibacterial soap, rinse and pat dry prior to dressing wounds Cleanser: Wound Cleanser 3 x Per Week/30 Days Discharge Instructions: Wash your hands with soap and water. Remove old dressing, discard into plastic bag and place into trash. Cleanse the wound with Wound Cleanser prior to applying a clean dressing using gauze sponges, not tissues or cotton balls. Do not scrub or use excessive force. Pat dry using gauze sponges, not tissue or cotton balls. Prim Dressing: Prisma 4.34 (in) 3 x Per Week/30  Days ary Discharge Instructions: Moisten w surgi lube Secondary Dressing: (BORDER) Zetuvit Plus SILICONE BORDER Dressing 4x4 (in/in) 3 x Per Week/30 Days Discharge Instructions: Please do not put silicone bordered dressings under wraps. Use non-bordered dressing only. 1. I change the dressing on the buttock wound to silver collagen border foam this can be changed every second day to 3 times a week 2 T the right dorsal hand Xeroform and gauze change daily o #3 I was concerned about the patient sleeping in a recliner not just given the small open wound she has but the surrounding firm tissue which might represent subcutaneous tissue at risk. I went over this with the patient and her brother. 4. No concrete evidence of infection in either area. No cultures or antibiotics Electronic Signature(s) Signed: 05/22/2023 4:31:08 PM By: Baltazar Najjar MD Entered By: Baltazar Najjar on 05/19/2023  08:21:57 -------------------------------------------------------------------------------- ROS/PFSH Details Patient Name: Date of Service: Cheryl Martinet, DO RIS M. 05/19/2023 10:00 A M Medical Record Number: [id number] Patient Account Number: [id number] Date of Birth/Sex: Treating RN: 06-29-1925 (89 y.o. [name] Primary Care Provider: Jerl Mina Other Clinician: Referring Provider: Treating Provider/Extender: Chauncey Mann, MICHA EL Randol Kern in Treatment: 0 JOSPHINE, KNAUB ([id number]) [id number] Page 8 of 10 Information Obtained From Patient Chart Constitutional Symptoms (General Health) Complaints and Symptoms: Negative for: Fatigue; Fever; Chills; Marked Weight Change Eyes Complaints and Symptoms: Negative for: Dry Eyes; Vision Changes; Glasses / Contacts Ear/Nose/Mouth/Throat Complaints and Symptoms: Negative for: Difficult clearing ears; Sinusitis Hematologic/Lymphatic Complaints and Symptoms: Positive for: Bleeding / Clotting Disorders Review of System Notes: recent DVT in Eliquis Gastrointestinal Complaints and Symptoms: Negative for: Frequent diarrhea; Nausea; Vomiting Endocrine Complaints and Symptoms: Positive for: Thyroid disease - hypo Medical History: Positive for: Type II Diabetes Time with diabetes: 22 years Treated with: Diet Blood sugar tested every day: No Immunological Complaints and Symptoms: Negative for: Hives; Itching Integumentary (Skin) Complaints and Symptoms: Positive for: Wounds Medical History: Positive for: History of pressure wounds Neurologic Complaints and Symptoms: Negative for: Numbness/parasthesias; Focal/Weakness Psychiatric Complaints and Symptoms: Negative for: Anxiety; Claustrophobia Respiratory Medical History: Positive for: Sleep Apnea Cardiovascular Medical History: Positive for: Deep Vein Thrombosis; Hypertension Genitourinary Complaints and Symptoms: Review of  System Notes: CKD stage JULITZA, BRADSHER ([id number]) [phone].pdf Page 9 of 10 Musculoskeletal Medical History: Positive for: Gout; Rheumatoid Arthritis; Osteoarthritis Oncologic Immunizations Pneumococcal Vaccine: Received Pneumococcal Vaccination: Yes Received Pneumococcal Vaccination On or After 60th Birthday: Yes Implantable Devices None Family and Social History Never smoker; Alcohol Use: Never; Drug Use: No History; Caffeine Use: Daily - tea diet coke Social Determinants of Health (SDOH) 1. In the past 2 months, did you or others you live with eat smaller meals or skip meals because you didn't have money for foodo : No 2. Are you homeless or worried that you might be in the futureo : No 3. Do you have trouble paying for your utilities (gas, electricity, phone)o : No 4. Do you have trouble finding or paying for a rideo : No 5. Do you need daycare, or better daycare, for your kidso : No 6. Are you unemployed or without regular incomeo : No 7. Do you need help finding a better jobo : No 8. Do you need help getting more educationo : No 9. Are you concerned about someone in your home using drugs or alcoholo : No 10. Do you feel unsafe in your daily lifeo : No 11. Is anyone in your home threatening or abusing  youo : No 12. Do you lack quality relationships that make you feel valued and supportedo : No 13. Do you need help getting cultural information in a language you understando : No 14. Do you need help getting internet accesso : No Advanced Directives and Instructions Electronic Signature(s) Signed: 05/19/2023 12:38:04 PM By: Angelina Pih Signed: 05/22/2023 4:31:08 PM By: Baltazar Najjar MD Entered By: Angelina Pih on 05/19/2023 07:17:04 -------------------------------------------------------------------------------- SuperBill Details Patient Name: Date of Service: Cheryl Martinet, DO RIS M. 05/19/2023 Medical Record Number: [id number] Patient  Account Number: [id number] Date of Birth/Sex: Treating RN: 17-Jul-1925 (89 y.o. [name] Primary Care Provider: Jerl Mina Other Clinician: Referring Provider: Treating Provider/Extender: RO BSO N, MICHA EL Randol Kern in Treatment: 0 Diagnosis Coding ICD-10 Codes Code Description [phone] Pressure ulcer of right buttock, stage 2 S60.511D Abrasion of right hand, subsequent encounter Facility Procedures : [facility] Code[place]lin, [place] ([id number]) [id number] Description: [phone] 213 - WOUND CARE VISIT-LEV 3 EST PT Modifier: [id number] Quantity: .pdf Page 10 of 10 Physician Procedures : CPT4 Code Description Modifier [id number] WC PHYS LEVEL 3 NEW PT ICD-10 Diagnosis Description L89.312 Pressure ulcer of right buttock, stage 2 S60.511D Abrasion of right hand, subsequent encounter Quantity: 1 Electronic Signature(s) Signed: 05/19/2023 11:28:09 AM By: Angelina Pih Signed: 05/22/2023 4:31:08 PM By: Baltazar Najjar MD Entered By: Angelina Pih on 05/19/2023 08:28:09

## 2023-05-22 NOTE — Progress Notes (Signed)
Cheryl Rios, Cheryl Rios ([id number]) [id number] Page 1 of 11 Visit Report for 05/19/2023 Allergy List Details Patient Name: Date of Service: Cheryl Martinet, DO RIS M. 05/19/2023 10:00 A M Medical Record Number: [id number] Patient Account Number: [id number] Date of Birth/Sex: Treating RN: 1925-06-26 (89 y.o. Cheryl Rios Primary Care Kristal Perl: Jerl Mina Other Clinician: Referring Domanique Luckett: Treating Rhia Blatchford/Extender: RO BSO N, MICHA EL Randol Kern in Treatment: 0 Allergies Active Allergies No Known Drug Allergies Allergy Notes Electronic Signature(s) Signed: 05/19/2023 12:38:04 PM By: Angelina Pih Entered By: Angelina Pih on 05/19/2023 07:13:49 -------------------------------------------------------------------------------- Arrival Information Details Patient Name: Date of Service: Cheryl Martinet, DO RIS M. 05/19/2023 10:00 A M Medical Record Number: [id number] Patient Account Number: [id number] Date of Birth/Sex: Treating RN: 10/12/1925 (89 y.o. Cheryl Rios Primary Care Madisin Hasan: Jerl Mina Other Clinician: Referring Deon Ivey: Treating Kacen Mellinger/Extender: RO BSO Dorris Carnes, MICHA EL Randol Kern in Treatment: 0 Visit Information Patient Arrived: Wheel Chair Arrival Time: 10:01 Accompanied By: family Transfer Assistance: None Patient Identification Verified: Yes Secondary Verification Process Completed: Yes Patient Has Alerts: Yes Patient Alerts: Patient on Blood Thinner type 2 diabetic Fall Risk Electronic Signature(s) Signed: 05/19/2023 12:14:35 PM By: Lurena Nida, Sharyn Lull ([id number]) [id number] Page 2 of 11 Entered By: Angelina Pih on 05/19/2023 09:14:35 -------------------------------------------------------------------------------- Clinic Level of Care Assessment Details Patient Name: Date of Service: Cheryl Martinet, DO RIS M. 05/19/2023 10:00 A M Medical Record Number:  [id number] Patient Account Number: [id number] Date of Birth/Sex: Treating RN: 07/26/1925 (89 y.o. Cheryl Rios Primary Care Alexus Galka: Jerl Mina Other Clinician: Referring Juanisha Bautch: Treating Arthor Gorter/Extender: RO BSO Dorris Carnes, MICHA EL Randol Kern in Treatment: 0 Clinic Level of Care Assessment Items TOOL 1 Quantity Score []  - 0 Use when EandM and Procedure is performed on INITIAL visit ASSESSMENTS - Nursing Assessment / Reassessment X- 1 20 General Physical Exam (combine w/ comprehensive assessment (listed just below) when performed on new pt. evals) X- 1 25 Comprehensive Assessment (HX, ROS, Risk Assessments, Wounds Hx, etc.) ASSESSMENTS - Wound and Skin Assessment / Reassessment []  - 0 Dermatologic / Skin Assessment (not related to wound area) ASSESSMENTS - Ostomy and/or Continence Assessment and Care []  - 0 Incontinence Assessment and Management []  - 0 Ostomy Care Assessment and Management (repouching, etc.) PROCESS - Coordination of Care X - Simple Patient / Family Education for ongoing care 1 15 []  - 0 Complex (extensive) Patient / Family Education for ongoing care X- 1 10 Staff obtains Chiropractor, Records, T Results / Process Orders est []  - 0 Staff telephones HHA, Nursing Homes / Clarify orders / etc []  - 0 Routine Transfer to another Facility (non-emergent condition) []  - 0 Routine Hospital Admission (non-emergent condition) X- 1 15 New Admissions / Manufacturing engineer / Ordering NPWT Apligraf, etc. , []  - 0 Emergency Hospital Admission (emergent condition) PROCESS - Special Needs []  - 0 Pediatric / Minor Patient Management []  - 0 Isolation Patient Management []  - 0 Hearing / Language / Visual special needs []  - 0 Assessment of Community assistance (transportation, D/C planning, etc.) []  - 0 Additional assistance / Altered mentation []  - 0 Support Surface(s) Assessment (bed, cushion, seat, etc.) INTERVENTIONS - Miscellaneous []  -  0 External ear exam X- 1 10 Patient Transfer (multiple staff / Nurse, adult / Similar devices) []  - 0 Simple Staple / Suture removal (25 or less) []  - 0 Complex Staple / Suture removal (26 or more) AARYANNA, RICARDO ([id number]) [id number] Page 3 of 11 []  -  0 Hypo/Hyperglycemic Management (do not check if billed separately) []  - 0 Ankle / Brachial Index (ABI) - do not check if billed separately Has the patient been seen at the hospital within the last three years: Yes Total Score: 95 Level Of Care: New/Established - Level 3 Electronic Signature(s) Signed: 05/19/2023 12:38:04 PM By: Angelina Pih Entered By: Angelina Pih on 05/19/2023 08:28:01 -------------------------------------------------------------------------------- Encounter Discharge Information Details Patient Name: Date of Service: Cheryl Martinet, DO RIS M. 05/19/2023 10:00 A M Medical Record Number: [id number] Patient Account Number: [id number] Date of Birth/Sex: Treating RN: Mar 04, 1926 (89 y.o. Cheryl Rios Primary Care Margaurite Salido: Jerl Mina Other Clinician: Referring Brandii Lakey: Treating Daphnie Venturini/Extender: RO BSO N, MICHA EL Randol Kern in Treatment: 0 Encounter Discharge Information Items Post Procedure Vitals Discharge Condition: Stable Temperature (F): 97.6 Ambulatory Status: Ambulatory Pulse (bpm): 105 Discharge Destination: Home Respiratory Rate (breaths/min): 18 Transportation: Private Auto Blood Pressure (mmHg): 146/83 Accompanied By: family Schedule Follow-up Appointment: Yes Clinical Summary of Care: Electronic Signature(s) Signed: 05/19/2023 11:31:52 AM By: Angelina Pih Entered By: Angelina Pih on 05/19/2023 08:31:52 -------------------------------------------------------------------------------- Lower Extremity Assessment Details Patient Name: Date of Service: Cheryl Martinet, DO RIS M. 05/19/2023 10:00 A M Medical Record Number: [id number] Patient  Account Number: [id number] Date of Birth/Sex: Treating RN: Oct 01, 1925 (89 y.o. Cheryl Rios Primary Care Tashiana Lamarca: Jerl Mina Other Clinician: Referring Kindred Heying: Treating Jacquelynne Guedes/Extender: RO BSO N, MICHA EL Randol Kern in Treatment: 0 Electronic Signature(s) Signed: 05/19/2023 12:38:04 PM By: Angelina Pih Entered By: Angelina Pih on 05/19/2023 07:19:25 Cheryl Rios ([id number]) [id number] Page 4 of 11 -------------------------------------------------------------------------------- Multi Wound Chart Details Patient Name: Date of Service: Cheryl Martinet, DO RIS M. 05/19/2023 10:00 A M Medical Record Number: [id number] Patient Account Number: [id number] Date of Birth/Sex: Treating RN: 05-10-1926 (89 y.o. Cheryl Rios Primary Care Lenwood Balsam: Jerl Mina Other Clinician: Referring Prosperity Darrough: Treating Babetta Paterson/Extender: RO BSO N, MICHA EL Randol Kern in Treatment: 0 Vital Signs Height(in): 61 Pulse(bpm): 105 Weight(lbs): 175 Blood Pressure(mmHg): 146/83 Body Mass Index(BMI): 33.1 Temperature(F): 97.6 Respiratory Rate(breaths/min): 18 [1:Photos:] [N/A:N/A] Right Hand - Dorsum Right Gluteus N/A Wound Location: Skin Tear/Laceration Pressure Injury N/A Wounding Event: Skin Tear Pressure Ulcer N/A Primary Etiology: Sleep Apnea, Deep Vein Thrombosis, Sleep Apnea, Deep Vein Thrombosis, N/A Comorbid History: Hypertension, Type II Diabetes, Hypertension, Type II Diabetes, History of pressure wounds, Gout, History of pressure wounds, Gout, Rheumatoid Arthritis, Osteoarthritis Rheumatoid Arthritis, Osteoarthritis 05/16/2023 04/05/2023 N/A Date Acquired: 0 0 N/A Weeks of Treatment: Open Open N/A Wound Status: No No N/A Wound Recurrence: 3.2x2.5x0.1 0.8x0.5x0.1 N/A Measurements L x W x D (cm) 6.283 0.314 N/A A (cm) : rea 0.628 0.031 N/A Volume (cm) : Full Thickness Without Exposed Category/Stage III  N/A Classification: Support Structures Medium Medium N/A Exudate A mount: Serosanguineous Serosanguineous N/A Exudate Type: red, brown red, brown N/A Exudate Color: Large (67-100%) Small (1-33%) N/A Granulation A mount: Red, Pink Red N/A Granulation Quality: None Present (0%) Large (67-100%) N/A Necrotic A mount: Fat Layer (Subcutaneous Tissue): Yes Fat Layer (Subcutaneous Tissue): Yes N/A Exposed Structures: None None N/A Epithelialization: Treatment Notes Electronic Signature(s) Signed: 05/19/2023 11:26:18 AM By: Angelina Pih Entered By: Angelina Pih on 05/19/2023 08:26:17 Cheryl Rios ([id number]) [id number] Page 5 of 11 -------------------------------------------------------------------------------- Multi-Disciplinary Care Plan Details Patient Name: Date of Service: Cheryl Martinet, DO RIS M. 05/19/2023 10:00 A M Medical Record Number: [id number] Patient Account Number: [id number] Date of Birth/Sex: Treating RN: 1926/03/04 (89 y.o. Cheryl Rios Primary Care Nathaneil Feagans:  Jerl Mina Other Clinician: Referring Alyene Predmore: Treating Saadiya Wilfong/Extender: Chauncey Mann, MICHA EL Randol Kern in Treatment: 0 Active Inactive Necrotic Tissue Nursing Diagnoses: Impaired tissue integrity related to necrotic/devitalized tissue Knowledge deficit related to management of necrotic/devitalized tissue Goals: Necrotic/devitalized tissue will be minimized in the wound bed Date Initiated: 05/19/2023 Target Resolution Date: 08/11/2023 Goal Status: Active Patient/caregiver will verbalize understanding of reason and process for debridement of necrotic tissue Date Initiated: 05/19/2023 Date Inactivated: 05/19/2023 Target Resolution Date: 05/19/2023 Goal Status: Met Interventions: Assess patient pain level pre-, during and post procedure and prior to discharge Provide education on necrotic tissue and debridement process Treatment Activities: Apply  topical anesthetic as ordered : 05/19/2023 Excisional debridement : 05/19/2023 Notes: Orientation to the Wound Care Program Nursing Diagnoses: Knowledge deficit related to the wound healing center program Goals: Patient/caregiver will verbalize understanding of the Wound Healing Center Program Date Initiated: 05/19/2023 Target Resolution Date: 05/26/2023 Goal Status: Active Interventions: Provide education on orientation to the wound center Notes: Pressure Nursing Diagnoses: Knowledge deficit related to causes and risk factors for pressure ulcer development Knowledge deficit related to management of pressures ulcers Potential for impaired tissue integrity related to pressure, friction, moisture, and shear Goals: Patient will remain free from development of additional pressure ulcers Date Initiated: 05/19/2023 Target Resolution Date: 07/14/2023 Goal Status: Active Patient will remain free of pressure ulcers Date Initiated: 05/19/2023 Target Resolution Date: 08/11/2023 Cheryl Rios, Cheryl Rios ([id number]) [id number] Page 6 of 11 Goal Status: Active Patient/caregiver will verbalize risk factors for pressure ulcer development Date Initiated: 05/19/2023 Date Inactivated: 05/19/2023 Target Resolution Date: 05/19/2023 Goal Status: Met Patient/caregiver will verbalize understanding of pressure ulcer management Date Initiated: 05/19/2023 Date Inactivated: 05/19/2023 Target Resolution Date: 05/19/2023 Goal Status: Met Interventions: Assess: immobility, friction, shearing, incontinence upon admission and as needed Assess offloading mechanisms upon admission and as needed Assess potential for pressure ulcer upon admission and as needed Provide education on pressure ulcers Notes: Wound/Skin Impairment Nursing Diagnoses: Impaired tissue integrity Knowledge deficit related to ulceration/compromised skin integrity Goals: Ulcer/skin breakdown will have a volume  reduction of 30% by week 4 Date Initiated: 05/19/2023 Target Resolution Date: 06/16/2023 Goal Status: Active Ulcer/skin breakdown will have a volume reduction of 50% by week 8 Date Initiated: 05/19/2023 Target Resolution Date: 07/14/2023 Goal Status: Active Ulcer/skin breakdown will have a volume reduction of 80% by week 12 Date Initiated: 05/19/2023 Target Resolution Date: 08/11/2023 Goal Status: Active Ulcer/skin breakdown will heal within 14 weeks Date Initiated: 05/19/2023 Target Resolution Date: 08/25/2023 Goal Status: Active Interventions: Assess patient/caregiver ability to obtain necessary supplies Assess patient/caregiver ability to perform ulcer/skin care regimen upon admission and as needed Assess ulceration(s) every visit Provide education on ulcer and skin care Notes: Electronic Signature(s) Signed: 05/19/2023 11:30:54 AM By: Angelina Pih Previous Signature: 05/19/2023 11:29:54 AM Version By: Angelina Pih Entered By: Angelina Pih on 05/19/2023 08:30:54 -------------------------------------------------------------------------------- Pain Assessment Details Patient Name: Date of Service: Cheryl Martinet, DO RIS M. 05/19/2023 10:00 A M Medical Record Number: [id number] Patient Account Number: [id number] Date of Birth/Sex: Treating RN: 12/07/1925 (89 y.o. Cheryl Rios Primary Care Landyn Buckalew: Jerl Mina Other Clinician: Referring Alonah Lineback: Treating Adreonna Yontz/Extender: RO BSO Dorris Carnes, MICHA EL Randol Kern in Treatment: 0 Active Problems Location of Pain Severity and Description of Pain Patient Has Paino No Cheryl Rios, Cheryl Rios ([id number]) [id number] Page 7 of 11 Patient Has Paino No Site Locations Pain Management and Medication Current Pain Management: Electronic Signature(s) Signed: 05/19/2023 12:38:04 PM By: Angelina Pih Entered By: Angelina Pih on  05/19/2023  07:12:55 -------------------------------------------------------------------------------- Patient/Caregiver Education Details Patient Name: Date of Service: Cheryl Martinet, DO RIS M. 12/27/2024andnbsp10:00 A M Medical Record Number: [id number] Patient Account Number: [id number] Date of Birth/Gender: Treating RN: [birth year] (89 y.o. Cheryl Rios Primary Care Physician: Jerl Mina Other Clinician: Referring Physician: Treating Physician/Extender: RO BSO Dorris Carnes, MICHA EL Randol Kern in Treatment: 0 Education Assessment Education Provided To: Patient Education Topics Provided Pressure: Handouts: Pressure Injury: Prevention and Offloading Methods: Explain/Verbal Responses: State content correctly Welcome T [facility]New [facility] Packet: o Handouts: [facility] Pledge form, Welcome T [facility] o Methods: Explain/Verbal Responses: State content correctly Wound Debridement: Handouts: Wound Debridement Methods: Explain/Verbal Responses: State content correctly Wound/Skin Impairment: Handouts: Caring for Your Ulcer Cheryl Rios, Cheryl Rios ([id number]) [id number] Page 8 of 11 Methods: Explain/Verbal Responses: State content correctly Electronic Signature(s) Signed: 05/19/2023 12:38:04 PM By: Angelina Pih Entered By: Angelina Pih on 05/19/2023 08:31:04 -------------------------------------------------------------------------------- Wound Assessment Details Patient Name: Date of Service: Cheryl Martinet, DO RIS M. 05/19/2023 10:00 A M Medical Record Number: [id number] Patient Account Number: [id number] Date of Birth/Sex: Treating RN: [birth year] (89 y.o. Cheryl Rios Primary Care Nance Mccombs: Jerl Mina Other Clinician: Referring Dallin Mccorkel: Treating Dyesha Henault/Extender: RO BSO N, MICHA EL Randol Kern in Treatment: 0 Wound Status Wound Number: 1 Primary Skin T ear Etiology: Wound Location: Right Hand -  Dorsum Wound Open Wounding Event: Skin Tear/Laceration Status: Date Acquired: 05/16/2023 Comorbid Sleep Apnea, Deep Vein Thrombosis, Hypertension, Type II Weeks Of Treatment: 0 History: Diabetes, History of pressure wounds, Gout, Rheumatoid Arthritis, Clustered Wound: No Osteoarthritis Photos Wound Measurements Length: (cm) 3.2 Width: (cm) 2.5 Depth: (cm) 0.1 Area: (cm) 6.283 Volume: (cm) 0.628 % Reduction in Area: % Reduction in Volume: Epithelialization: None Tunneling: No Undermining: No Wound Description Classification: Full Thickness Without Exposed Suppor Exudate Amount: Medium Exudate Type: Serosanguineous Exudate Color: red, brown t Structures Foul Odor After Cleansing: No Slough/Fibrino No Wound Bed Granulation Amount: Large (67-100%) Exposed Structure Granulation Quality: Red, Pink Fat Layer (Subcutaneous Tissue) Exposed: Yes Necrotic Amount: None Present (0%) Treatment Notes Cheryl Rios, Cheryl Rios ([id number]) [id number] Page 9 of 11 Wound #1 (Hand - Dorsum) Wound Laterality: Right Cleanser Soap and Water Discharge Instruction: Gently cleanse wound with antibacterial soap, rinse and pat dry prior to dressing wounds Wound Cleanser Discharge Instruction: Wash your hands with soap and water. Remove old dressing, discard into plastic bag and place into trash. Cleanse the wound with Wound Cleanser prior to applying a clean dressing using gauze sponges, not tissues or cotton balls. Do not scrub or use excessive force. Pat dry using gauze sponges, not tissue or cotton balls. Peri-Wound Care Topical Primary Dressing Xeroform 4x4-HBD (in/in) Discharge Instruction: DOUBLE LAYER Apply Xeroform 4x4-HBD (in/in) as directed Secondary Dressing Gauze Discharge Instruction: As directed: dry Secured With Kerlix Roll Sterile or Non-Sterile 6-ply 4.5x4 (yd/yd) Discharge Instruction: Apply Kerlix as directed Stretch Net Dressing, Latex-free, Size 5,  Small-Head / Shoulder / Thigh Compression Wrap Compression Stockings Add-Ons Electronic Signature(s) Signed: 05/19/2023 12:38:04 PM By: Angelina Pih Entered By: Angelina Pih on 05/19/2023 07:40:01 -------------------------------------------------------------------------------- Wound Assessment Details Patient Name: Date of Service: Cheryl Martinet, DO RIS M. 05/19/2023 10:00 A M Medical Record Number: [id number] Patient Account Number: [id number] Date of Birth/Sex: Treating RN: 1925-08-25 (89 y.o. Cheryl Rios Primary Care Ileana Chalupa: Jerl Mina Other Clinician: Referring Selenne Coggin: Treating Tylesha Gibeault/Extender: RO BSO N, MICHA EL Randol Kern in Treatment: 0 Wound Status Wound Number: 2 Primary Pressure Ulcer  Etiology: Wound Location: Right Gluteus Wound Open Wounding Event: Pressure Injury Status: Date Acquired: 04/05/2023 Comorbid Sleep Apnea, Deep Vein Thrombosis, Hypertension, Type II Weeks Of Treatment: 0 History: Diabetes, History of pressure wounds, Gout, Rheumatoid Arthritis, Clustered Wound: No Osteoarthritis Photos Cheryl Rios, Cheryl Rios ([id number]) [id number] Page 10 of 11 Wound Measurements Length: (cm) 0.8 Width: (cm) 0.5 Depth: (cm) 0.1 Area: (cm) 0.314 Volume: (cm) 0.031 % Reduction in Area: % Reduction in Volume: Epithelialization: None Tunneling: No Undermining: No Wound Description Classification: Category/Stage III Exudate Amount: Medium Exudate Type: Serosanguineous Exudate Color: red, brown Foul Odor After Cleansing: No Slough/Fibrino Yes Wound Bed Granulation Amount: Small (1-33%) Exposed Structure Granulation Quality: Red Fat Layer (Subcutaneous Tissue) Exposed: Yes Necrotic Amount: Large (67-100%) Necrotic Quality: Adherent Slough Treatment Notes Wound #2 (Gluteus) Wound Laterality: Right Cleanser Soap and Water Discharge Instruction: Gently cleanse wound with antibacterial soap, rinse and pat  dry prior to dressing wounds Wound Cleanser Discharge Instruction: Wash your hands with soap and water. Remove old dressing, discard into plastic bag and place into trash. Cleanse the wound with Wound Cleanser prior to applying a clean dressing using gauze sponges, not tissues or cotton balls. Do not scrub or use excessive force. Pat dry using gauze sponges, not tissue or cotton balls. Peri-Wound Care Topical Primary Dressing Prisma 4.34 (in) Discharge Instruction: Moisten w surgi lube Secondary Dressing (BORDER) Zetuvit Plus SILICONE BORDER Dressing 4x4 (in/in) Discharge Instruction: Please do not put silicone bordered dressings under wraps. Use non-bordered dressing only. Secured With Compression Wrap Compression Stockings Add-Ons Electronic Signature(s) Signed: 05/19/2023 12:38:04 PM By: Angelina Pih Entered By: Angelina Pih on 05/19/2023 07:41:58 Cheryl Rios ([id number]) [id number] Page 11 of 11 -------------------------------------------------------------------------------- Vitals Details Patient Name: Date of Service: Cheryl Martinet, DO RIS M. 05/19/2023 10:00 A M Medical Record Number: [id number] Patient Account Number: [id number] Date of Birth/Sex: Treating RN: 04/18/1926 (89 y.o. Cheryl Rios Primary Care Jerrian Mells: Jerl Mina Other Clinician: Referring Darvin Dials: Treating Annemarie Sebree/Extender: RO BSO N, MICHA EL Randol Kern in Treatment: 0 Vital Signs Time Taken: 10:13 Temperature (F): 97.6 Height (in): 61 Pulse (bpm): 105 Source: Stated Respiratory Rate (breaths/min): 18 Weight (lbs): 175 Blood Pressure (mmHg): 146/83 Source: Stated Reference Range: 80 - 120 mg / dl Body Mass Index (BMI): 33.1 Electronic Signature(s) Signed: 05/19/2023 12:38:04 PM By: Angelina Pih Entered By: Angelina Pih on 05/19/2023 07:13:41

## 2023-05-29 ENCOUNTER — Ambulatory Visit: Payer: BLUE CROSS/BLUE SHIELD | Admitting: Physician Assistant

## 2023-06-01 ENCOUNTER — Encounter: Payer: MEDICARE | Attending: Physician Assistant | Admitting: Physician Assistant

## 2023-06-01 DIAGNOSIS — L89620 Pressure ulcer of left heel, unstageable: Secondary | ICD-10-CM | POA: Diagnosis not present

## 2023-06-01 DIAGNOSIS — G4730 Sleep apnea, unspecified: Secondary | ICD-10-CM | POA: Diagnosis not present

## 2023-06-01 DIAGNOSIS — E039 Hypothyroidism, unspecified: Secondary | ICD-10-CM | POA: Insufficient documentation

## 2023-06-01 DIAGNOSIS — I10 Essential (primary) hypertension: Secondary | ICD-10-CM | POA: Insufficient documentation

## 2023-06-01 DIAGNOSIS — L89312 Pressure ulcer of right buttock, stage 2: Secondary | ICD-10-CM | POA: Diagnosis not present

## 2023-06-01 DIAGNOSIS — M069 Rheumatoid arthritis, unspecified: Secondary | ICD-10-CM | POA: Insufficient documentation

## 2023-06-01 DIAGNOSIS — E1151 Type 2 diabetes mellitus with diabetic peripheral angiopathy without gangrene: Secondary | ICD-10-CM | POA: Insufficient documentation

## 2023-06-01 DIAGNOSIS — Z7901 Long term (current) use of anticoagulants: Secondary | ICD-10-CM | POA: Diagnosis not present

## 2023-06-01 DIAGNOSIS — L97428 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: Secondary | ICD-10-CM | POA: Diagnosis not present

## 2023-06-01 DIAGNOSIS — E11621 Type 2 diabetes mellitus with foot ulcer: Secondary | ICD-10-CM | POA: Insufficient documentation

## 2023-06-01 DIAGNOSIS — L89313 Pressure ulcer of right buttock, stage 3: Secondary | ICD-10-CM | POA: Diagnosis not present

## 2023-06-01 DIAGNOSIS — M109 Gout, unspecified: Secondary | ICD-10-CM | POA: Diagnosis not present

## 2023-06-01 NOTE — Progress Notes (Signed)
 Cheryl Rios, [place] ([id number]) [id number] Page 1 of 13 Visit Report for 06/01/2023 Arrival Information Details Patient Name: Date of Service: Cheryl TAD ORN, DO RIS M. 06/01/2023 12:00 PM Medical Record Number: [id number] Patient Account Number: [id number] Date of Birth/Sex: Treating RN: 1926/04/25 (89 y.o. Cheryl Rios Primary Care Cheryl Rios: Cheryl Rios Other Clinician: Referring Disney Ruggiero: Treating [place] Brogden/Extender: Bethena Andre Cheryl Rios Cheryl Rios in Treatment: 1 Visit Information History Since Last Visit Added or deleted any medications: No Patient Arrived: Wheel Chair Any new allergies or adverse reactions: No Arrival Time: 12:38 Had a fall or experienced change in No Accompanied By: family activities of daily living that may affect Transfer Assistance: None risk of falls: Patient Identification Verified: Yes Signs or symptoms of abuse/neglect since last visito No Secondary Verification Process Completed: Yes Hospitalized since last visit: No Patient Has Alerts: Yes Has Dressing in Place as Prescribed: Yes Patient Alerts: Patient on Blood Thinner Pain Present Now: No type 2 diabetic Fall Risk Electronic Signature(s) Signed: 06/01/2023 4:11:19 PM By: Vaughn Rios Entered By: Vaughn Rios on 06/01/2023 12:38:59 -------------------------------------------------------------------------------- Clinic Level of Care Assessment Details Patient Name: Date of Service: Cheryl TAD ORN, DO RIS M. 06/01/2023 12:00 PM Medical Record Number: [id number] Patient Account Number: [id number] Date of Birth/Sex: Treating RN: 11-Oct-1925 (89 y.o. Cheryl Rios Primary Care Cheryl Rios: Cheryl Rios Other Clinician: Referring Cheryl Rios: Treating Cheryl Rios/Extender: Bethena Andre Cheryl Rios Cheryl Rios in Treatment: 1 Clinic Level of Care Assessment Items TOOL 1 Quantity Score []  - 0 Use when EandM and Procedure is performed on INITIAL visit ASSESSMENTS - Nursing  Assessment / Reassessment []  - 0 General Physical Exam (combine w/ comprehensive assessment (listed just below) when performed on new pt. evals) []  - 0 Comprehensive Assessment (HX, ROS, Risk Assessments, Wounds Hx, etc.) ASSESSMENTS - Wound and Skin Assessment / Reassessment []  - 0 Dermatologic / Skin Assessment (not related to wound area) LATANZA, PFEFFERKORN ([id number]) [id number] Page 2 of 13 ASSESSMENTS - Ostomy and/or Continence Assessment and Care []  - 0 Incontinence Assessment and Management []  - 0 Ostomy Care Assessment and Management (repouching, etc.) PROCESS - Coordination of Care []  - 0 Simple Patient / Family Education for ongoing care []  - 0 Complex (extensive) Patient / Family Education for ongoing care []  - 0 Staff obtains Chiropractor, Records, T Results / Process Orders est []  - 0 Staff telephones HHA, Nursing Homes / Clarify orders / etc []  - 0 Routine Transfer to another Facility (non-emergent condition) []  - 0 Routine Hospital Admission (non-emergent condition) []  - 0 New Admissions / Manufacturing Engineer / Ordering NPWT Apligraf, etc. , []  - 0 Emergency Hospital Admission (emergent condition) PROCESS - Special Needs []  - 0 Pediatric / Minor Patient Management []  - 0 Isolation Patient Management []  - 0 Hearing / Language / Visual special needs []  - 0 Assessment of Community assistance (transportation, D/C planning, etc.) []  - 0 Additional assistance / Altered mentation []  - 0 Support Surface(s) Assessment (bed, cushion, seat, etc.) INTERVENTIONS - Miscellaneous []  - 0 External ear exam []  - 0 Patient Transfer (multiple staff / Nurse, Adult / Similar devices) []  - 0 Simple Staple / Suture removal (25 or less) []  - 0 Complex Staple / Suture removal (26 or more) []  - 0 Hypo/Hyperglycemic Management (do not check if billed separately) []  - 0 Ankle / Brachial Index (ABI) - do not check if billed separately Has the  patient been seen at the hospital within the last three years: Yes Total Score: 0 Level Of Care: ____  Electronic Signature(s) Signed: 06/01/2023 4:11:19 PM By: Vaughn Rios Entered By: Vaughn Rios on 06/01/2023 13:59:01 -------------------------------------------------------------------------------- Encounter Discharge Information Details Patient Name: Date of Service: Cheryl TAD ORN, DO RIS M. 06/01/2023 12:00 PM Medical Record Number: [id number] Patient Account Number: [id number] Date of Birth/Sex: Treating RN: [birth year] (89 y.o. Cheryl Rios Primary Care Cheryl Rios: Cheryl Rios Other Clinician: Referring Cheryl Rios: Treating Cheryl Rios/Extender: Bethena Andre Cheryl Rios Cheryl Rios in Treatment: 1 Encounter Discharge Information Items Post Procedure [name], [name] ([id number]) [id number] Page 3 of 13 Discharge Condition: Stable Temperature (F): 97.5 Ambulatory Status: Wheelchair Pulse (bpm): 102 Discharge Destination: Home Respiratory Rate (breaths/min): 18 Transportation: Private Auto Blood Pressure (mmHg): 162/82 Accompanied By: family Schedule Follow-up Appointment: Yes Clinical Summary of Care: Electronic Signature(s) Signed: 06/01/2023 2:00:56 PM By: Vaughn Rios Entered By: Vaughn Rios on 06/01/2023 14:00:56 -------------------------------------------------------------------------------- Lower Extremity Assessment Details Patient Name: Date of Service: Cheryl TAD ORN, DO RIS M. 06/01/2023 12:00 PM Medical Record Number: [id number] Patient Account Number: [id number] Date of Birth/Sex: Treating RN: April 24, 1926 (89 y.o. Cheryl Rios Primary Care Cheryl Rios: Cheryl Rios Other Clinician: Referring Cheryl Rios: Treating Cheryl Rios/Extender: Bethena Andre Cheryl Rios Cheryl Rios in Treatment: 1 Vascular Assessment Pulses: Dorsalis Pedis Palpable: [Left:Yes] Extremity colors, hair growth, and conditions: Extremity Color:  [Left:Normal] Hair Growth on Extremity: [Left:No] Temperature of Extremity: [Left:Warm < 3 seconds] Toe Nail Assessment Left: Right: Thick: Yes Discolored: Yes Deformed: No Improper Length and Hygiene: No Electronic Signature(s) Signed: 06/01/2023 4:11:19 PM By: Vaughn Rios Entered By: Vaughn Rios on 06/01/2023 12:28:39 -------------------------------------------------------------------------------- Multi Wound Chart Details Patient Name: Date of Service: Cheryl TAD ORN, DO RIS M. 06/01/2023 12:00 PM DEBORAHA DONIA CHRISTELLA ([id number]) [id number] Page 4 of 13 Medical Record Number: [id number] Patient Account Number: [id number] Date of Birth/Sex: Treating RN: 09-Aug-1925 (89 y.o. Cheryl Rios Primary Care Angella Montas: Cheryl Rios Other Clinician: Referring Malakye Nolden: Treating Bijon Mineer/Extender: Bethena Andre Cheryl Rios Cheryl Rios in Treatment: 1 Vital Signs Height(in): 61 Pulse(bpm): 102 Weight(lbs): 175 Blood Pressure(mmHg): 162/82 Body Mass Index(BMI): 33.1 Temperature(F): 97.5 Respiratory Rate(breaths/min): 18 [1:Photos:] Right Hand - Dorsum Right Gluteus Right, Distal, Medial Gluteus Wound Location: Skin Tear/Laceration Pressure Injury Pressure Injury Wounding Event: Skin Tear Pressure Ulcer Pressure Ulcer Primary Etiology: Sleep Apnea, Deep Vein Thrombosis, Sleep Apnea, Deep Vein Thrombosis, Sleep Apnea, Deep Vein Thrombosis, Comorbid History: Hypertension, Type II Diabetes, Hypertension, Type II Diabetes, Hypertension, Type II Diabetes, History of pressure wounds, Gout, History of pressure wounds, Gout, History of pressure wounds, Gout, Rheumatoid Arthritis, Osteoarthritis Rheumatoid Arthritis, Osteoarthritis Rheumatoid Arthritis, Osteoarthritis 05/16/2023 04/05/2023 06/01/2023 Date Acquired: 1 1 0 Weeks of Treatment: Open Open Open Wound Status: No No No Wound Recurrence: 1.5x1.5x0.1 0.3x0.4x0.1 0.5x0.5x0.1 Measurements L x W x D  (cm) 1.767 0.094 0.196 A (cm) : rea 0.177 0.009 0.02 Volume (cm) : 71.90% 70.10% N/A % Reduction in Area: 71.80% 71.00% N/A % Reduction in Volume: Full Thickness Without Exposed Category/Stage II Category/Stage II Classification: Support Structures Medium Medium Medium Exudate A mount: Serosanguineous Serosanguineous Serosanguineous Exudate Type: red, brown red, brown red, brown Exudate Color: Small (1-33%) Small (1-33%) Large (67-100%) Granulation A mount: Red, Pink Red Red Granulation Quality: Large (67-100%) Large (67-100%) None Present (0%) Necrotic A mount: Fat Layer (Subcutaneous Tissue): Yes Fat Layer (Subcutaneous Tissue): Yes N/A Exposed Structures: Small (1-33%) None None Epithelialization: Wound Number: 4 N/A N/A Photos: N/A N/A Left Calcaneus N/A N/A Wound Location: Gradually Appeared N/A N/A Wounding Event: Diabetic Wound/Ulcer of the Lower N/A N/A Primary Etiology: Extremity Sleep Apnea, Deep Vein Thrombosis, N/A N/A Comorbid History: Hypertension,  Type II Diabetes, History of pressure wounds, Gout, Rheumatoid Arthritis, Osteoarthritis 04/23/2023 N/A N/A Date Acquired: 0 N/A N/A Weeks of Treatment: Open N/A N/A Wound Status: No N/A N/A Wound Recurrence: 0.1x0.1x0.1 N/A N/A Measurements L x W x D (cm) 0.008 N/A N/A A (cm) : rea 0.001 N/A N/A Volume (cm) : N/A N/A N/A % Reduction in A rea: N/A N/A N/A % Reduction in Volume: Unable to visualize wound bed N/A N/A Classification: None Present N/A N/A Exudate A mountNAKESHIA, WALDECK ([id number]) [id number] Page 5 of 13 N/A N/A N/A Exudate Type: N/A N/A N/A Exudate Color: None Present (0%) N/A N/A Granulation A mount: N/A N/A N/A Granulation Quality: None Present (0%) N/A N/A Necrotic A mount: N/A N/A N/A Exposed Structures: None N/A N/A Epithelialization: Treatment Notes Electronic Signature(s) Signed: 06/01/2023 4:11:19 PM By: Vaughn Rios Entered By: Vaughn Rios on 06/01/2023 12:39:44 -------------------------------------------------------------------------------- Multi-Disciplinary Care Plan Details Patient Name: Date of Service: Cheryl TAD ORN, DO RIS M. 06/01/2023 12:00 PM Medical Record Number: [id number] Patient Account Number: [id number] Date of Birth/Sex: Treating RN: April 11, 1926 (89 y.o. Cheryl Rios Primary Care Pearlina Friedly: Cheryl Rios Other Clinician: Referring Chanda Laperle: Treating Chace Klippel/Extender: Bethena Andre Cheryl Rios Cheryl Rios in Treatment: 1 Active Inactive Necrotic Tissue Nursing Diagnoses: Impaired tissue integrity related to necrotic/devitalized tissue Knowledge deficit related to management of necrotic/devitalized tissue Goals: Necrotic/devitalized tissue will be minimized in the wound bed Date Initiated: 05/19/2023 Target Resolution Date: 08/11/2023 Goal Status: Active Patient/caregiver will verbalize understanding of reason and process for debridement of necrotic tissue Date Initiated: 05/19/2023 Date Inactivated: 05/19/2023 Target Resolution Date: 05/19/2023 Goal Status: Met Interventions: Assess patient pain level pre-, during and post procedure and prior to discharge Provide education on necrotic tissue and debridement process Treatment Activities: Apply topical anesthetic as ordered : 05/19/2023 Excisional debridement : 05/19/2023 Notes: Pressure Nursing Diagnoses: Knowledge deficit related to causes and risk factors for pressure ulcer development Knowledge deficit related to management of pressures ulcers Potential for impaired tissue integrity related to pressure, friction, moisture, and shear Goals: Patient will remain free from development of additional pressure ulcers Date Initiated: 05/19/2023 Target Resolution Date: 07/14/2023 Goal Status: Active JEANNIFER, DRA[place]FORD ([id number]) [id number] Page 6 of 13 Patient will remain free of pressure  ulcers Date Initiated: 05/19/2023 Target Resolution Date: 08/11/2023 Goal Status: Active Patient/caregiver will verbalize risk factors for pressure ulcer development Date Initiated: 05/19/2023 Date Inactivated: 05/19/2023 Target Resolution Date: 05/19/2023 Goal Status: Met Patient/caregiver will verbalize understanding of pressure ulcer management Date Initiated: 05/19/2023 Date Inactivated: 05/19/2023 Target Resolution Date: 05/19/2023 Goal Status: Met Interventions: Assess: immobility, friction, shearing, incontinence upon admission and as needed Assess offloading mechanisms upon admission and as needed Assess potential for pressure ulcer upon admission and as needed Provide education on pressure ulcers Notes: Wound/Skin Impairment Nursing Diagnoses: Impaired tissue integrity Knowledge deficit related to ulceration/compromised skin integrity Goals: Ulcer/skin breakdown will have a volume reduction of 30% by week 4 Date Initiated: 05/19/2023 Target Resolution Date: 06/16/2023 Goal Status: Active Ulcer/skin breakdown will have a volume reduction of 50% by week 8 Date Initiated: 05/19/2023 Target Resolution Date: 07/14/2023 Goal Status: Active Ulcer/skin breakdown will have a volume reduction of 80% by week 12 Date Initiated: 05/19/2023 Target Resolution Date: 08/11/2023 Goal Status: Active Ulcer/skin breakdown will heal within 14 weeks Date Initiated: 05/19/2023 Target Resolution Date: 08/25/2023 Goal Status: Active Interventions: Assess patient/caregiver ability to obtain necessary supplies Assess patient/caregiver ability to perform ulcer/skin care regimen upon admission and as needed Assess ulceration(s) every visit Provide education on  ulcer and skin care Notes: Electronic Signature(s) Signed: 06/01/2023 1:59:23 PM By: Vaughn Rios Entered By: Vaughn Rios on 06/01/2023 13:59:23 -------------------------------------------------------------------------------- Pain  Assessment Details Patient Name: Date of Service: Cheryl TAD ORN, DO RIS M. 06/01/2023 12:00 PM Medical Record Number: [id number] Patient Account Number: [id number] Date of Birth/Sex: Treating RN: 1925-12-10 (89 y.o. Cheryl Rios Primary Care Deberah Adolf: Cheryl Rios Other Clinician: Referring Lacosta Hargan: Treating Casanova Schurman/Extender: Bethena Andre Cheryl Rios Cheryl Rios in Treatment: 1 Active Problems LASHONA, SCHAAF ([id number]) [id number] Page 7 of 13 Location of Pain Severity and Description of Pain Patient Has Paino No Site Locations Pain Management and Medication Current Pain Management: Electronic Signature(s) Signed: 06/01/2023 4:11:19 PM By: Vaughn Rios Entered By: Vaughn Rios on 06/01/2023 12:39:37 -------------------------------------------------------------------------------- Patient/Caregiver Education Details Patient Name: Date of Service: Cheryl TAD ORN, DO RIS M. 1/9/2025andnbsp12:00 PM Medical Record Number: [id number] Patient Account Number: [id number] Date of Birth/Gender: Treating RN: [birth year] (89 y.o. Cheryl Rios Primary Care Physician: Cheryl Rios Other Clinician: Referring Physician: Treating Physician/Extender: Bethena Andre Cheryl Rios Cheryl Rios in Treatment: 1 Education Assessment Education Provided To: Patient Education Topics Provided Pressure: Handouts: Pressure Injury: Prevention and Offloading Methods: Explain/Verbal Responses: State content correctly Wound Debridement: Handouts: Wound Debridement Methods: Explain/Verbal Responses: State content correctly Wound/Skin Impairment: Handouts: Caring for Your Ulcer Methods: Explain/Verbal Responses: State content correctly AMINA, MENCHACA ([id number]) [id number] Page 8 of 13 Electronic Signature(s) Signed: 06/01/2023 4:11:19 PM By: Vaughn Rios Entered By: Vaughn Rios on 06/01/2023  13:59:50 -------------------------------------------------------------------------------- Wound Assessment Details Patient Name: Date of Service: Cheryl TAD ORN, DO RIS M. 06/01/2023 12:00 PM Medical Record Number: [id number] Patient Account Number: [id number] Date of Birth/Sex: Treating RN: Aug 17, 1925 (89 y.o. Cheryl Rios Primary Care Cammie Faulstich: Cheryl Rios Other Clinician: Referring Brionne Mertz: Treating Ivylynn Hoppes/Extender: Bethena Andre Cheryl Rios Cheryl Rios in Treatment: 1 Wound Status Wound Number: 1 Primary Skin T ear Etiology: Wound Location: Right Hand - Dorsum Wound Open Wounding Event: Skin Tear/Laceration Status: Date Acquired: 05/16/2023 Comorbid Sleep Apnea, Deep Vein Thrombosis, Hypertension, Type II Weeks Of Treatment: 1 History: Diabetes, History of pressure wounds, Gout, Rheumatoid Arthritis, Clustered Wound: No Osteoarthritis Photos Wound Measurements Length: (cm) 1.5 Width: (cm) 1.5 Depth: (cm) 0.1 Area: (cm) 1.767 Volume: (cm) 0.177 % Reduction in Area: 71.9% % Reduction in Volume: 71.8% Epithelialization: Small (1-33%) Undermining: No Wound Description Classification: Full Thickness Without Exposed Suppor Exudate Amount: Medium Exudate Type: Serosanguineous Exudate Color: red, brown t Structures Foul Odor After Cleansing: No Slough/Fibrino No Wound Bed Granulation Amount: Small (1-33%) Exposed Structure Granulation Quality: Red, Pink Fat Layer (Subcutaneous Tissue) Exposed: Yes Necrotic Amount: Large (67-100%) Necrotic Quality: Adherent Slough Treatment Notes Wound #1 (Hand - Dorsum) Wound Laterality: Right RENLEE, FLOOR ([id number]) [id number] Page 9 of 13 Cleanser Soap and Water Discharge Instruction: Gently cleanse wound with antibacterial soap, rinse and pat dry prior to dressing wounds Wound Cleanser Discharge Instruction: Wash your hands with soap and water. Remove old dressing, discard into plastic bag and  place into trash. Cleanse the wound with Wound Cleanser prior to applying a clean dressing using gauze sponges, not tissues or cotton balls. Do not scrub or use excessive force. Pat dry using gauze sponges, not tissue or cotton balls. Peri-Wound Care Topical Primary Dressing Xeroform 4x4-HBD (in/in) Discharge Instruction: DOUBLE LAYER Apply Xeroform 4x4-HBD (in/in) as directed Secondary Dressing Gauze Discharge Instruction: As directed: dry Secured With Kerlix Roll Sterile or Non-Sterile 6-ply 4.5x4 (yd/yd) Discharge Instruction: Apply Kerlix as directed Stretch Net Dressing, Latex-free, Size 5, Small-Head / Shoulder / Thigh Compression  Wrap Compression Stockings Add-Ons Electronic Signature(s) Signed: 06/01/2023 4:11:19 PM By: Vaughn Rios Entered By: Vaughn Rios on 06/01/2023 12:25:17 -------------------------------------------------------------------------------- Wound Assessment Details Patient Name: Date of Service: Cheryl TAD ORN, DO RIS M. 06/01/2023 12:00 PM Medical Record Number: [id number] Patient Account Number: [id number] Date of Birth/Sex: Treating RN: 02/03/1926 (89 y.o. Cheryl Rios Primary Care Takari Lundahl: Cheryl Rios Other Clinician: Referring Jermiya Reichl: Treating Uriel Dowding/Extender: Bethena Andre Cheryl Rios Cheryl Rios in Treatment: 1 Wound Status Wound Number: 2 Primary Pressure Ulcer Etiology: Wound Location: Right Gluteus Wound Open Wounding Event: Pressure Injury Status: Date Acquired: 04/05/2023 Comorbid Sleep Apnea, Deep Vein Thrombosis, Hypertension, Type II Weeks Of Treatment: 1 History: Diabetes, History of pressure wounds, Gout, Rheumatoid Arthritis, Clustered Wound: No Osteoarthritis Photos BATHSHEBA, DURRETT ([id number]) [id number] Page 10 of 13 Wound Measurements Length: (cm) 0.3 Width: (cm) 0.4 Depth: (cm) 0.1 Area: (cm) 0.094 Volume: (cm) 0.009 % Reduction in Area: 70.1% % Reduction in Volume:  71% Epithelialization: None Tunneling: No Undermining: No Wound Description Classification: Category/Stage III Exudate Amount: Medium Exudate Type: Serosanguineous Exudate Color: red, brown Foul Odor After Cleansing: No Slough/Fibrino Yes Wound Bed Granulation Amount: Small (1-33%) Exposed Structure Granulation Quality: Red Fat Layer (Subcutaneous Tissue) Exposed: Yes Necrotic Amount: Large (67-100%) Necrotic Quality: Adherent Slough Treatment Notes Wound #2 (Gluteus) Wound Laterality: Right Cleanser Soap and Water Discharge Instruction: Gently cleanse wound with antibacterial soap, rinse and pat dry prior to dressing wounds Wound Cleanser Discharge Instruction: Wash your hands with soap and water. Remove old dressing, discard into plastic bag and place into trash. Cleanse the wound with Wound Cleanser prior to applying a clean dressing using gauze sponges, not tissues or cotton balls. Do not scrub or use excessive force. Pat dry using gauze sponges, not tissue or cotton balls. Peri-Wound Care Topical Primary Dressing Prisma 4.34 (in) Discharge Instruction: Moisten w/ surgi lube Secondary Dressing (BORDER) Zetuvit Plus SILICONE BORDER Dressing 4x4 (in/in) Discharge Instruction: Please do not put silicone bordered dressings under wraps. Use non-bordered dressing only. Secured With Compression Wrap Compression Stockings Add-Ons Electronic Signature(s) Signed: 06/01/2023 4:11:19 PM By: Vaughn Rios Entered By: Gordon, Caitlin on 06/01/2023 12:48:28 DEBORAHA DONIA HERO ([id number]) [id number] Page 11 of 13 -------------------------------------------------------------------------------- Wound Assessment Details Patient Name: Date of Service: Cheryl TAD ORN, DO RIS M. 06/01/2023 12:00 PM Medical Record Number: [id number] Patient Account Number: [id number] Date of Birth/Sex: Treating RN: 1925/09/23 (89 y.o. Cheryl Rios Primary Care Erine Phenix: Cheryl Rios Other Clinician: Referring Vanassa Penniman: Treating Daveon Arpino/Extender: Bethena Andre Cheryl Rios Cheryl Rios in Treatment: 1 Wound Status Wound Number: 3 Primary Pressure Ulcer Etiology: Wound Location: Right, Distal, Medial Gluteus Wound Open Wounding Event: Pressure Injury Status: Date Acquired: 06/01/2023 Comorbid Sleep Apnea, Deep Vein Thrombosis, Hypertension, Type II Weeks Of Treatment: 0 History: Diabetes, History of pressure wounds, Gout, Rheumatoid Arthritis, Clustered Wound: No Osteoarthritis Photos Wound Measurements Length: (cm) 0.5 Width: (cm) 0.5 Depth: (cm) 0.1 Area: (cm) 0.196 Volume: (cm) 0.02 % Reduction in Area: % Reduction in Volume: Epithelialization: None Tunneling: No Undermining: No Wound Description Classification: Category/Stage II Exudate Amount: Medium Exudate Type: Serosanguineous Exudate Color: red, brown Foul Odor After Cleansing: No Slough/Fibrino No Wound Bed Granulation Amount: Large (67-100%) Granulation Quality: Red Necrotic Amount: None Present (0%) Treatment Notes Wound #3 (Gluteus) Wound Laterality: Right, Medial, Distal Cleanser Soap and Water Discharge Instruction: Gently cleanse wound with antibacterial soap, rinse and pat dry prior to dressing wounds Wound Cleanser Discharge Instruction: Wash your hands with soap and water. Remove old dressing, discard into plastic bag and place  into trash. Cleanse the wound with Wound Cleanser prior to applying a clean dressing using gauze sponges, not tissues or cotton balls. Do not scrub or use excessive force. Pat dry using gauze sponges, not tissue or cotton balls. CHONTEL, WARNING ([id number]) [id number] Page 12 of 13 Peri-Wound Care Topical Primary Dressing Prisma 4.34 (in) Discharge Instruction: Moisten w/ surgi lube Secondary Dressing (BORDER) Zetuvit Plus SILICONE BORDER Dressing 4x4 (in/in) Discharge Instruction: Please do not put silicone bordered  dressings under wraps. Use non-bordered dressing only. Secured With Compression Wrap Compression Stockings Add-Ons Electronic Signature(s) Signed: 06/01/2023 4:11:19 PM By: Vaughn Rios Entered By: Vaughn Rios on 06/01/2023 12:26:53 -------------------------------------------------------------------------------- Wound Assessment Details Patient Name: Date of Service: Cheryl TAD ORN, DO RIS M. 06/01/2023 12:00 PM Medical Record Number: [id number] Patient Account Number: [id number] Date of Birth/Sex: Treating RN: 1926/02/04 (89 y.o. Cheryl Rios Primary Care Druanne Bosques: Cheryl Rios Other Clinician: Referring Javone Ybanez: Treating Jaxson Keener/Extender: Bethena Andre Cheryl Rios Cheryl Rios in Treatment: 1 Wound Status Wound Number: 4 Primary Diabetic Wound/Ulcer of the Lower Extremity Etiology: Wound Location: Left Calcaneus Wound Open Wounding Event: Gradually Appeared Status: Date Acquired: 04/23/2023 Comorbid Sleep Apnea, Deep Vein Thrombosis, Hypertension, Type II Weeks Of Treatment: 0 History: Diabetes, History of pressure wounds, Gout, Rheumatoid Arthritis, Clustered Wound: No Osteoarthritis Photos Wound Measurements Length: (cm) 1 Width: (cm) 1.2 Depth: (cm) 0.1 Area: (cm) 0.942 Volume: (cm) 0.094 LONDYN, HOTARD ([id number]) Wound Description Classification: Unable to visualize wound bed Exudate Amount: None Present Foul Odor After Cleansing: No Slough/Fibrino No % Reduction in Area: % Reduction in Volume: Epithelialization: None Tunneling: No Undermining: No [id number] Page 13 of 13 Wound Bed Granulation Amount: None Present (0%) Necrotic Amount: None Present (0%) Electronic Signature(s) Signed: 06/01/2023 4:11:19 PM By: Vaughn Rios Entered By: Vaughn Rios on 06/01/2023 12:45:38 -------------------------------------------------------------------------------- Vitals Details Patient Name: Date of Service: Cheryl TAD ORN, DO RIS M.  06/01/2023 12:00 PM Medical Record Number: [id number] Patient Account Number: [id number] Date of Birth/Sex: Treating RN: 03-09-1926 (89 y.o. Cheryl Rios Primary Care Zoran Yankee: Cheryl Rios Other Clinician: Referring Oleg Oleson: Treating Fernanda Twaddell/Extender: Bethena Andre Cheryl Rios Cheryl Rios in Treatment: 1 Vital Signs Time Taken: 12:30 Temperature (F): 97.5 Height (in): 61 Pulse (bpm): 102 Weight (lbs): 175 Respiratory Rate (breaths/min): 18 Body Mass Index (BMI): 33.1 Blood Pressure (mmHg): 162/82 Reference Range: 80 - 120 mg / dl Electronic Signature(s) Signed: 06/01/2023 4:11:19 PM By: Vaughn Rios Entered By: Vaughn Rios on 06/01/2023 12:39:31

## 2023-06-01 NOTE — Progress Notes (Addendum)
 Cheryl Rios ([id number]) [id number] Page 1 of 12 Visit Report for 06/01/2023 Chief Complaint Document Details Patient Name: Date of Service: Cheryl Rios, Cheryl RIS M. 06/01/2023 12:00 PM Medical Record Number: [id number] Patient Account Number: [id number] Date of Birth/Sex: Treating RN: [birth year] (89 y.o. Cheryl Rios Primary Care Provider: Valora Agent Other Clinician: Referring Provider: Treating Provider/Extender: Bethena Andre [name] in Treatment: 1 Information Obtained from: Patient Chief Complaint Right buttock, left heel, and right hand ulcers Electronic Signature(s) Signed: 06/01/2023 3:08:07 PM By: Bethena Andre PA-C Previous Signature: 06/01/2023 12:37:04 PM Version By: Bethena Andre PA-C Previous Signature: 06/01/2023 12:28:24 PM Version By: Bethena Andre PA-C Entered By: Bethena Andre on 06/01/2023 15:08:06 -------------------------------------------------------------------------------- Debridement Details Patient Name: Date of Service: Cheryl Rios, Cheryl RIS M. 06/01/2023 12:00 PM Medical Record Number: [id number] Patient Account Number: [id number] Date of Birth/Sex: Treating RN: [birth year] (89 y.o. Cheryl Rios Primary Care Provider: Valora Agent Other Clinician: Referring Provider: Treating Provider/Extender: Bethena Andre [name] in Treatment: 1 Debridement Performed for Assessment: Wound #1 Right Hand - Dorsum Performed By: Physician Bethena Andre, PA-C The following information was scribed by: Vaughn Rios The information was scribed for: Bethena Andre Debridement Type: Debridement Level of Consciousness (Pre-procedure): Awake and Alert Pre-procedure Verification/Time Out Yes - 12:39 Taken: Pain Control: Lidocaine  4% T opical Solution Percent of Wound Bed Debrided: 100% T Area Debrided (cm): otal 1.77 Tissue and other material debrided: Viable, Non-Viable, Slough, Subcutaneous, Slough Level: Skin/Subcutaneous  Tissue Debridement Description: Excisional Instrument: Curette Bleeding: Moderate Hemostasis Achieved: Pressure Cheryl Rios ([id number]) [id number] Page 2 of 12 Response to Treatment: Procedure was tolerated well Level of Consciousness (Post- Awake and Alert procedure): Post Debridement Measurements of Total Wound Length: (cm) 1.5 Width: (cm) 1.5 Depth: (cm) 0.1 Volume: (cm) 0.177 Character of Wound/Ulcer Post Debridement: Stable Post Procedure Diagnosis Same as Pre-procedure Electronic Signature(s) Signed: 06/01/2023 3:53:53 PM By: Bethena Andre PA-C Signed: 06/01/2023 4:11:19 PM By: Vaughn Rios Entered By: Vaughn Rios on 06/01/2023 12:40:35 -------------------------------------------------------------------------------- Debridement Details Patient Name: Date of Service: Cheryl Rios, Cheryl RIS M. 06/01/2023 12:00 PM Medical Record Number: [id number] Patient Account Number: [id number] Date of Birth/Sex: Treating RN: 1926-03-30 (89 y.o. Cheryl Rios Primary Care Provider: Valora Agent Other Clinician: Referring Provider: Treating Provider/Extender: Bethena Andre [name] in Treatment: 1 Debridement Performed for Assessment: Wound #4 Left Calcaneus Performed By: Physician Bethena Andre, PA-C The following information was scribed by: Vaughn Rios The information was scribed for: Bethena Andre Debridement Type: Debridement Severity of Tissue Pre Debridement: Limited to breakdown of skin Level of Consciousness (Pre-procedure): Awake and Alert Pre-procedure Verification/Time Out Yes - 12:40 Taken: Pain Control: Lidocaine  4% T opical Solution Percent of Wound Bed Debrided: 100% T Area Debrided (cm): otal 0.94 Tissue and other material debrided: Viable, Non-Viable, Callus, Eschar Level: Non-Viable Tissue Debridement Description: Selective/Open Wound Instrument: Curette Bleeding: None Response to Treatment: Procedure was tolerated  well Level of Consciousness (Post- Awake and Alert procedure): Post Debridement Measurements of Total Wound Length: (cm) 1 Width: (cm) 1.2 Depth: (cm) 0.1 Volume: (cm) 0.094 Character of Wound/Ulcer Post Debridement: Stable Severity of Tissue Post Debridement: Limited to breakdown of skin Post Procedure Diagnosis Same as Pre-procedure Cheryl Rios ([id number]) [id number] Page 3 of 12 Electronic Signature(s) Signed: 06/01/2023 3:53:53 PM By: Bethena Andre PA-C Signed: 06/01/2023 4:11:19 PM By: Vaughn Rios Entered By: Vaughn Rios on 06/01/2023 12:46:51 -------------------------------------------------------------------------------- Debridement Details Patient Name: Date of Service: Cheryl Rios, Cheryl RIS M. 06/01/2023 12:00 PM  Medical Record Number: [id number] Patient Account Number: [id number] Date of Birth/Sex: Treating RN: 04/30/1926 (89 y.o. Cheryl Rios Primary Care Provider: Valora Agent Other Clinician: Referring Provider: Treating Provider/Extender: Bethena Andre [name] in Treatment: 1 Debridement Performed for Assessment: Wound #3 Right,Distal,Medial Gluteus Performed By: Physician Bethena Andre, PA-C The following information was scribed by: Vaughn Rios The information was scribed for: Bethena Andre Debridement Type: Debridement Level of Consciousness (Pre-procedure): Awake and Alert Pre-procedure Verification/Time Out Yes - 12:50 Taken: Pain Control: Lidocaine  4% T opical Solution Percent of Wound Bed Debrided: 100% T Area Debrided (cm): otal 0.2 Tissue and other material debrided: Viable, Non-Viable, Skin: Dermis , Skin: Epidermis Level: Skin/Epidermis Debridement Description: Selective/Open Wound Instrument: Curette Bleeding: None Response to Treatment: Procedure was tolerated well Level of Consciousness (Post- Awake and Alert procedure): Post Debridement Measurements of Total Wound Length: (cm) 0.5 Stage:  Category/Stage II Width: (cm) 0.5 Depth: (cm) 0.1 Volume: (cm) 0.02 Character of Wound/Ulcer Post Debridement: Stable Post Procedure Diagnosis Same as Pre-procedure Electronic Signature(s) Signed: 06/01/2023 3:53:53 PM By: Bethena Andre PA-C Signed: 06/01/2023 4:11:19 PM By: Vaughn Rios Entered By: Vaughn Rios on 06/01/2023 12:50:37 Cheryl Rios ([id number]) [id number] Page 4 of 12 -------------------------------------------------------------------------------- Debridement Details Patient Name: Date of Service: Cheryl Rios, Cheryl RIS M. 06/01/2023 12:00 PM Medical Record Number: [id number] Patient Account Number: [id number] Date of Birth/Sex: Treating RN: 06-Mar-1926 (89 y.o. Cheryl Rios Primary Care Provider: Valora Agent Other Clinician: Referring Provider: Treating Provider/Extender: Bethena Andre [name] in Treatment: 1 Debridement Performed for Assessment: Wound #2 Right Gluteus Performed By: Physician Bethena Andre, PA-C The following information was scribed by: Vaughn Rios The information was scribed for: Bethena Andre Debridement Type: Debridement Level of Consciousness (Pre-procedure): Awake and Alert Pre-procedure Verification/Time Out Yes - 12:49 Taken: Percent of Wound Bed Debrided: 100% T Area Debrided (cm): otal 0.09 Tissue and other material debrided: Viable, Non-Viable, Slough, Subcutaneous, Slough Level: Skin/Subcutaneous Tissue Debridement Description: Excisional Instrument: Curette Bleeding: Minimum Hemostasis Achieved: Pressure Response to Treatment: Procedure was tolerated well Level of Consciousness (Post- Awake and Alert procedure): Post Debridement Measurements of Total Wound Length: (cm) 0.3 Stage: Category/Stage III Width: (cm) 0.4 Depth: (cm) 0.1 Volume: (cm) 0.009 Character of Wound/Ulcer Post Debridement: Stable Post Procedure Diagnosis Same as Pre-procedure Electronic Signature(s) Signed:  06/01/2023 3:53:53 PM By: Bethena Andre PA-C Signed: 06/01/2023 4:11:19 PM By: Vaughn Rios Entered By: Vaughn Rios on 06/01/2023 12:50:48 -------------------------------------------------------------------------------- HPI Details Patient Name: Date of Service: Cheryl Rios, Cheryl RIS M. 06/01/2023 12:00 PM Medical Record Number: [id number] Patient Account Number: [id number] Date of Birth/Sex: Treating RN: 1926/01/06 (89 y.o. Cheryl Rios Primary Care Provider: Valora Agent Other Clinician: SUSSAN, Cheryl Rios ([id number]) [id number] Page 5 of 12 Referring Provider: Treating Provider/Extender: Bethena Andre [name] in Treatment: 1 History of Present Illness HPI Description: ADMISSION 05/18/2024 This is a a somewhat frail elderly woman who lives in her own home cared for predominantly by her brother. It seems that her most recent problems began on 02/10/2023 with a left hip fracture. Subsequently she developed a acute DVT ultimately requiring a thrombectomy of the left femoral vein, left external iliac and left common iliac veins, as well as the inferior vena cava. This was all done for a CT venogram that demonstrated large occlusive thrombus extending from the left femoral vein just above the knee to the infrarenal IVC, nonocclusive thrombus of the right common femoral vein and the right lower segment lung PE. She is now on Eliquis .  She spent some time in rehabilitation and in that timeframe developed a pressure ulcer on her right buttock. There are using silver alginate and bordered foam. Now that she is at home she has [facility] home care. More recently she traumatized her right dorsal hand on a wheelchair. It sounds as though this was initially a hematoma. With some portion of the surface epithelium did not hold. Past medical history includes hypothyroidism, type 2 diabetes, left hip fracture, exceptionally large left-sided DVT as noted above, now on  Eliquis , gout. The patient is now at home she walks with a walker apparently sleeps over the recliner. They have some form of pressure relief cushion on the recliner. Apparently she sleeps in a recliner because of back pain 06-01-23 upon evaluation today patient presents for follow-up concerning her wounds. She does have a wound over the right dorsal hand, right gluteal region, right distal/medial gluteal region, and her left heel. The distal gluteal is new in the left heel was apparently there last week that we were not told about this when she came in for initial evaluation that was when Dr. Lang saw her on admission I was not here. With that being said we will have a look at that today. Electronic Signature(s) Signed: 06/01/2023 3:07:35 PM By: Bethena Ferraris PA-C Entered By: Bethena Ferraris on 06/01/2023 15:07:34 -------------------------------------------------------------------------------- Physical Exam Details Patient Name: Date of Service: Cheryl Rios, Cheryl RIS M. 06/01/2023 12:00 PM Medical Record Number: [id number] Patient Account Number: [id number] Date of Birth/Sex: Treating RN: 21-May-1926 (89 y.o. Cheryl Rios Primary Care Provider: Valora Agent Other Clinician: Referring Provider: Treating Provider/Extender: Bethena Ferraris [name] in Treatment: 1 Constitutional Well-nourished and well-hydrated in no acute distress. Respiratory normal breathing without difficulty. Psychiatric this patient is able to make decisions and demonstrates good insight into disease process. Alert and Oriented x 3. pleasant and cooperative. Notes Upon inspection patient's wound bed actually did require sharp debridement in regard to her hand where I removed slough and biofilm down to good subcutaneous tissue. Also performed debridement on her right gluteal region, right distal gluteal region, and left heel. Postdebridement all wound sites actually appear to be doing significantly better which was  great news. Electronic Signature(s) Signed: 06/01/2023 3:08:20 PM By: Bethena Ferraris PA-C Entered By: Bethena Ferraris on 06/01/2023 15:08:20 Cheryl Rios ([id number]) [id number] Page 6 of 12 -------------------------------------------------------------------------------- Physician Orders Details Patient Name: Date of Service: Cheryl Rios, Cheryl RIS M. 06/01/2023 12:00 PM Medical Record Number: [id number] Patient Account Number: [id number] Date of Birth/Sex: Treating RN: [birth year] (89 y.o. Cheryl Rios Primary Care Provider: Valora Agent Other Clinician: Referring Provider: Treating Provider/Extender: Bethena Ferraris [name] in Treatment: 1 The following information was scribed by: Vaughn Rios The information was scribed for: Bethena Ferraris Verbal / Phone Orders: No Diagnosis Coding ICD-10 Coding Code Description [phone] Pressure ulcer of right buttock, stage 2 S61.411D Laceration without foreign body of right hand, subsequent encounter L89.620 Pressure ulcer of left heel, unstageable E11.621 Type 2 diabetes mellitus with foot ulcer I10 Essential (primary) hypertension Follow-up Appointments Return Appointment in 1 week. Home Health Home Health Company: - [facility] for wound care. May utilize formulary equivalent dressing for wound treatment orders unless otherwise specified. Home Health Nurse may visit PRN to address patients wound care needs. Scheduled days for dressing changes to be completed; exception, patient has scheduled wound care visit that day. **Please direct any NON-WOUND related issues/requests for orders to patient's Primary Care Physician. **If current  dressing causes regression in wound condition, may D/C ordered dressing product/s and apply Normal Saline Moist Dressing daily until next Wound Healing Center or Other MD appointment. **Notify Wound Healing Center of regression in wound condition at  [phone]. Bathing/ Shower/ Hygiene May shower with wound dressing protected with water repellent cover or cast protector. No tub bath. Anesthetic (Use 'Patient Medications' Section for Anesthetic Order Entry) Lidocaine  applied to wound bed Off-Loading Turn and reposition every 2 hours - try not to sleep in recliner Other: - Recommended to wear thin socks to help protect heels/feet from rubbing on shoes Wound Treatment Wound #1 - Hand - Dorsum Wound Laterality: Right Cleanser: Soap and Water 1 x Per Day/30 Days Discharge Instructions: Gently cleanse wound with antibacterial soap, rinse and pat dry prior to dressing wounds Cleanser: Wound Cleanser 1 x Per Day/30 Days Discharge Instructions: Wash your hands with soap and water. Remove old dressing, discard into plastic bag and place into trash. Cleanse the wound with Wound Cleanser prior to applying a clean dressing using gauze sponges, not tissues or cotton balls. Cheryl not scrub or use excessive force. Pat dry using gauze sponges, not tissue or cotton balls. Prim Dressing: Xeroform 4x4-HBD (in/in) 1 x Per Day/30 Days ary Discharge Instructions: DOUBLE LAYER Apply Xeroform 4x4-HBD (in/in) as directed Secondary Dressing: Gauze 1 x Per Day/30 Days Discharge Instructions: As directed: dry Secured With: Kerlix Roll Sterile or Non-Sterile 6-ply 4.5x4 (yd/yd) 1 x Per Day/30 Days Discharge Instructions: Apply Kerlix as directed Secured With: Stretch Net Dressing, Latex-free, Size 5, Small-Head / Shoulder / Thigh 1 x Per Day/30 Days Cheryl Rios, BRAAKSMA ([id number]) [id number] Page 7 of 12 Wound #2 - Gluteus Wound Laterality: Right Cleanser: Soap and Water 3 x Per Week/15 Days Discharge Instructions: Gently cleanse wound with antibacterial soap, rinse and pat dry prior to dressing wounds Cleanser: Wound Cleanser 3 x Per Week/15 Days Discharge Instructions: Wash your hands with soap and water. Remove old dressing, discard  into plastic bag and place into trash. Cleanse the wound with Wound Cleanser prior to applying a clean dressing using gauze sponges, not tissues or cotton balls. Cheryl not scrub or use excessive force. Pat dry using gauze sponges, not tissue or cotton balls. Prim Dressing: Prisma 4.34 (in) 3 x Per Week/15 Days ary Discharge Instructions: Moisten w/ surgi lube Secondary Dressing: (BORDER) Zetuvit Plus SILICONE BORDER Dressing 4x4 (in/in) 3 x Per Week/15 Days Discharge Instructions: Please Cheryl not put silicone bordered dressings under wraps. Use non-bordered dressing only. Wound #3 - Gluteus Wound Laterality: Right, Medial, Distal Cleanser: Soap and Water 3 x Per Week/15 Days Discharge Instructions: Gently cleanse wound with antibacterial soap, rinse and pat dry prior to dressing wounds Cleanser: Wound Cleanser 3 x Per Week/15 Days Discharge Instructions: Wash your hands with soap and water. Remove old dressing, discard into plastic bag and place into trash. Cleanse the wound with Wound Cleanser prior to applying a clean dressing using gauze sponges, not tissues or cotton balls. Cheryl not scrub or use excessive force. Pat dry using gauze sponges, not tissue or cotton balls. Prim Dressing: Prisma 4.34 (in) 3 x Per Week/15 Days ary Discharge Instructions: Moisten w/ surgi lube Secondary Dressing: (BORDER) Zetuvit Plus SILICONE BORDER Dressing 4x4 (in/in) 3 x Per Week/15 Days Discharge Instructions: Please Cheryl not put silicone bordered dressings under wraps. Use non-bordered dressing only. Wound #4 - Calcaneus Wound Laterality: Left Topical: Betadine  1 x Per Day/15 Days Discharge Instructions: Apply betadine  as directed. Prim Dressing: Gauze 1 x  Per Day/15 Days ary Discharge Instructions: As directed: dry Electronic Signature(s) Signed: 06/01/2023 1:58:54 PM By: Vaughn Rios Signed: 06/01/2023 3:53:53 PM By: Bethena Ferraris PA-C Entered By: Vaughn Rios on 06/01/2023  13:58:53 -------------------------------------------------------------------------------- Problem List Details Patient Name: Date of Service: Cheryl Rios, Cheryl RIS M. 06/01/2023 12:00 PM Medical Record Number: [id number] Patient Account Number: [id number] Date of Birth/Sex: Treating RN: Aug 17, 1925 (89 y.o. 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 06/01/2023 No Yes severity E11.621 Type 2 diabetes mellitus with foot ulcer 06/01/2023 No Yes I10 Essential (primary) hypertension 06/01/2023 No Yes Inactive Problems Resolved Problems Electronic Signature(s) Signed: 06/01/2023 3:07:27 PM By: Bethena Ferraris PA-C Previous Signature: 06/01/2023 12:36:34 PM Version By: Bethena Ferraris PA-C Previous Signature: 06/01/2023 12:34:42 PM Version By: Bethena Ferraris PA-C Previous Signature: 06/01/2023 12:28:22 PM Version By: Bethena Ferraris PA-C Entered By: Bethena Ferraris on 06/01/2023 15:07:27 -------------------------------------------------------------------------------- Progress Note Details Patient Name: Date of Service: Cheryl Rios, Cheryl RIS M. 06/01/2023 12:00 PM Medical Record Number: [id number] Patient Account Number: [id number] Date of Birth/Sex: Treating RN: April 11, 1926 (89 y.o. Cheryl Rios Primary Care Provider: Valora Agent Other Clinician: Referring Provider: Treating Provider/Extender: Bethena Ferraris [name] in  Treatment: 1 Subjective Chief Complaint Information obtained from Patient Right buttock, left heel, and right hand ulcers History of Present Illness (HPI) ADMISSION 05/18/2024 This is a a somewhat frail elderly woman who lives in her own home cared for predominantly by her brother. It seems that her most recent problems began on 02/10/2023 with a left hip fracture. Subsequently she developed a acute DVT ultimately requiring a thrombectomy of the left femoral vein, left external iliac and left common iliac veins, as well as the inferior vena cava. This was all done for a CT venogram that demonstrated large occlusive thrombus extending from the left femoral vein just above the knee to the infrarenal IVC, nonocclusive thrombus of the right common femoral vein and the right lower segment lung PE. She is now on Eliquis . She spent some time in rehabilitation and in that timeframe developed a pressure ulcer on her right buttock. There are using silver alginate and bordered foam. Now that she is at home she has [facility] home care. More recently she traumatized her right dorsal hand on a wheelchair. It sounds as though this was initially a hematoma. With some portion of the surface epithelium did not hold. Past medical history includes hypothyroidism, type 2 diabetes, left hip fracture, exceptionally large left-sided DVT as noted above, now on Eliquis , gout. Cheryl Rios, Cheryl Rios ([id number]) [id number] Page 9 of 12 The patient is now at home she walks with a walker apparently sleeps over the recliner. They have some form of pressure relief cushion on the recliner. Apparently she sleeps in a recliner because of back pain 06-01-23 upon evaluation today patient presents for follow-up concerning her wounds. She does have a wound over the right dorsal hand, right gluteal region, right distal/medial gluteal region, and her left heel. The distal gluteal is new in the left heel was  apparently there  last week that we were not told about this when she came in for initial evaluation that was when Dr. Lang saw her on admission I was not here. With that being said we will have a look at that today. Objective Constitutional Well-nourished and well-hydrated in no acute distress. Vitals Time Taken: 12:30 PM, Height: 61 in, Weight: 175 lbs, BMI: 33.1, Temperature: 97.5 F, Pulse: 102 bpm, Respiratory Rate: 18 breaths/min, Blood Pressure: 162/82 mmHg. Respiratory normal breathing without difficulty. Psychiatric this patient is able to make decisions and demonstrates good insight into disease process. Alert and Oriented x 3. pleasant and cooperative. General Notes: Upon inspection patient's wound bed actually did require sharp debridement in regard to her hand where I removed slough and biofilm down to good subcutaneous tissue. Also performed debridement on her right gluteal region, right distal gluteal region, and left heel. Postdebridement all wound sites actually appear to be doing significantly better which was great news. Integumentary (Hair, Skin) Wound #1 status is Open. Original cause of wound was Skin T ear/Laceration. The date acquired was: 05/16/2023. The wound has been in treatment 1 weeks. The wound is located on the Right Hand - Dorsum. The wound measures 1.5cm length x 1.5cm width x 0.1cm depth; 1.767cm^2 area and 0.177cm^3 volume. There is Fat Layer (Subcutaneous Tissue) exposed. There is no undermining noted. There is a medium amount of serosanguineous drainage noted. There is small (1-33%) red, pink granulation within the wound bed. There is a large (67-100%) amount of necrotic tissue within the wound bed including Adherent Slough. Wound #2 status is Open. Original cause of wound was Pressure Injury. The date acquired was: 04/05/2023. The wound has been in treatment 1 weeks. The wound is located on the Right Gluteus. The wound measures 0.3cm length x 0.4cm width x  0.1cm depth; 0.094cm^2 area and 0.009cm^3 volume. There is Fat Layer (Subcutaneous Tissue) exposed. There is no tunneling or undermining noted. There is a medium amount of serosanguineous drainage noted. There is small (1-33%) red granulation within the wound bed. There is a large (67-100%) amount of necrotic tissue within the wound bed including Adherent Slough. Wound #3 status is Open. Original cause of wound was Pressure Injury. The date acquired was: 06/01/2023. The wound is located on the Right,Distal,Medial Gluteus. The wound measures 0.5cm length x 0.5cm width x 0.1cm depth; 0.196cm^2 area and 0.02cm^3 volume. There is no tunneling or undermining noted. There is a medium amount of serosanguineous drainage noted. There is large (67-100%) red granulation within the wound bed. There is no necrotic tissue within the wound bed. Wound #4 status is Open. Original cause of wound was Gradually Appeared. The date acquired was: 04/23/2023. The wound is located on the Left Calcaneus. The wound measures 1cm length x 1.2cm width x 0.1cm depth; 0.942cm^2 area and 0.094cm^3 volume. There is no tunneling or undermining noted. There is a none present amount of drainage noted. There is no granulation within the wound bed. There is no necrotic tissue within the wound bed. 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 There was a Excisional Skin/Subcutaneous Tissue Debridement with ear a total area of 1.77 sq cm  performed by Bethena Ferraris, PA-C. With the following instrument(s): Curette to remove Viable and Non-Viable  tissue/material. Material removed includes Subcutaneous Tissue and Slough and after achieving pain control using Lidocaine  4% T opical Solution. No specimens were taken. A time out was conducted at 12:39, prior to the start of the procedure. A Moderate amount of bleeding was controlled with Pressure. The procedure was tolerated well. Post Debridement Measurements: 1.5cm length x 1.5cm width x 0.1cm depth; 0.177cm^3 volume. Character of Wound/Ulcer Post Debridement is stable. Post procedure Diagnosis Wound #1: Same as Pre-Procedure Wound #2 Cheryl Rios, Cheryl Rios ([id number]) [id number] Page 10 of 12 Pre-procedure diagnosis of Wound #2 is a Pressure Ulcer located on the Right Gluteus . There was a Excisional Skin/Subcutaneous Tissue Debridement with a total area of 0.09 sq cm performed by Bethena Ferraris, PA-C. With the following instrument(s): Curette to remove Viable and Non-Viable tissue/material. Material removed includes Subcutaneous Tissue and Slough and. No specimens were taken. A time out was conducted at 12:49, prior to the start of the procedure. A Minimum amount of bleeding was controlled with Pressure. The procedure was tolerated well. Post Debridement Measurements: 0.3cm length x 0.4cm width x 0.1cm depth; 0.009cm^3 volume. Post debridement Stage noted as Category/Stage III. Character of Wound/Ulcer Post Debridement is stable. Post procedure Diagnosis Wound #2: Same as Pre-Procedure Wound #3 Pre-procedure diagnosis of Wound #3 is a Pressure Ulcer located on the Right,Distal,Medial Gluteus . There was a Selective/Open Wound Skin/Epidermis Debridement with a total area of 0.2 sq cm performed by Bethena Ferraris, PA-C. With the following instrument(s): Curette to remove Viable and Non-Viable tissue/material. Material removed includes Skin: Dermis and Skin: Epidermis and after achieving pain control using Lidocaine  4% Topical Solution. No specimens were taken. A time out was  conducted at 12:50, prior to the start of the procedure. There was no bleeding. The procedure was tolerated well. Post Debridement Measurements: 0.5cm length x 0.5cm width x 0.1cm depth; 0.02cm^3 volume. Post debridement Stage noted as Category/Stage II. Character of Wound/Ulcer Post Debridement is stable. Post procedure Diagnosis Wound #3: Same as Pre-Procedure Wound #4 Pre-procedure diagnosis of Wound #4 is a Diabetic Wound/Ulcer of the Lower Extremity located on the Left Calcaneus .Severity of Tissue Pre Debridement is: Limited to breakdown of skin. There was a Selective/Open Wound Non-Viable Tissue Debridement with a total area of 0.94 sq cm performed by Bethena Ferraris, PA-C. With the following instrument(s): Curette to remove Viable and Non-Viable tissue/material. Material removed includes Eschar and Callus and after achieving pain control using Lidocaine  4% Topical Solution. No specimens were taken. A time out was conducted at 12:40, prior to the start of the procedure. There was no bleeding. The procedure was tolerated well. Post Debridement Measurements: 1cm length x 1.2cm width x 0.1cm depth; 0.094cm^3 volume. Character of Wound/Ulcer Post Debridement is stable. Severity of Tissue Post Debridement is: Limited to breakdown of skin. Post procedure Diagnosis Wound #4: Same as Pre-Procedure Plan Follow-up Appointments: Return Appointment in 1 week. Home Health: Home Health Company: - [facility] for wound care. May utilize formulary equivalent dressing for wound treatment orders unless otherwise specified. Home Health Nurse may visit PRN to address patients wound care needs. Scheduled days for dressing changes to be completed; exception, patient has scheduled wound care visit that day. **Please direct any NON-WOUND related issues/requests for orders to patient's Primary Care Physician. **If current dressing causes regression in wound condition, may D/C ordered dressing  product/s and apply Normal Saline Moist Dressing daily until next Wound  Healing Center or Other MD appointment. **Notify Wound Healing Center of regression in wound condition at [phone]. Bathing/ Shower/ Hygiene: May shower with wound dressing protected with water repellent cover or cast protector. No tub bath. Anesthetic (Use 'Patient Medications' Section for Anesthetic Order Entry): Lidocaine  applied to wound bed Off-Loading: Turn and reposition every 2 hours - try not to sleep in recliner Other: - Recommended to wear thin socks to help protect heels/feet from rubbing on shoes WOUND #1: - Hand - Dorsum Wound Laterality: Right Cleanser: Soap and Water 1 x Per Day/30 Days Discharge Instructions: Gently cleanse wound with antibacterial soap, rinse and pat dry prior to dressing wounds Cleanser: Wound Cleanser 1 x Per Day/30 Days Discharge Instructions: Wash your hands with soap and water. Remove old dressing, discard into plastic bag and place into trash. Cleanse the wound with Wound Cleanser prior to applying a clean dressing using gauze sponges, not tissues or cotton balls. Cheryl not scrub or use excessive force. Pat dry using gauze sponges, not tissue or cotton balls. Prim Dressing: Xeroform 4x4-HBD (in/in) 1 x Per Day/30 Days ary Discharge Instructions: DOUBLE LAYER Apply Xeroform 4x4-HBD (in/in) as directed Secondary Dressing: Gauze 1 x Per Day/30 Days Discharge Instructions: As directed: dry Secured With: Kerlix Roll Sterile or Non-Sterile 6-ply 4.5x4 (yd/yd) 1 x Per Day/30 Days Discharge Instructions: Apply Kerlix as directed Secured With: Stretch Net Dressing, Latex-free, Size 5, Small-Head / Shoulder / Thigh 1 x Per Day/30 Days WOUND #2: - Gluteus Wound Laterality: Right Cleanser: Soap and Water 3 x Per Week/15 Days Discharge Instructions: Gently cleanse wound with antibacterial soap, rinse and pat dry prior to dressing wounds Cleanser: Wound Cleanser 3 x Per Week/15  Days Discharge Instructions: Wash your hands with soap and water. Remove old dressing, discard into plastic bag and place into trash. Cleanse the wound with Wound Cleanser prior to applying a clean dressing using gauze sponges, not tissues or cotton balls. Cheryl not scrub or use excessive force. Pat dry using gauze sponges, not tissue or cotton balls. Prim Dressing: Prisma 4.34 (in) 3 x Per Week/15 Days ary Discharge Instructions: Moisten w/ surgi lube Secondary Dressing: (BORDER) Zetuvit Plus SILICONE BORDER Dressing 4x4 (in/in) 3 x Per Week/15 Days Discharge Instructions: Please Cheryl not put silicone bordered dressings under wraps. Use non-bordered dressing only. WOUND #3: - Gluteus Wound Laterality: Right, Medial, Distal Cleanser: Soap and Water 3 x Per Week/15 Days Discharge Instructions: Gently cleanse wound with antibacterial soap, rinse and pat dry prior to dressing wounds Cleanser: Wound Cleanser 3 x Per Week/15 Days Discharge Instructions: Wash your hands with soap and water. Remove old dressing, discard into plastic bag and place into trash. Cleanse the wound with Wound Cleanser prior to applying a clean dressing using gauze sponges, not tissues or cotton balls. Cheryl not scrub or use excessive force. Pat dry using gauze sponges, not tissue or cotton balls. Prim Dressing: Prisma 4.34 (in) 3 x Per Week/15 Days ary Discharge Instructions: Moisten w/ surgi lube Secondary Dressing: (BORDER) Zetuvit Plus SILICONE BORDER Dressing 4x4 (in/in) 3 x Per Week/15 Days Discharge Instructions: Please Cheryl not put silicone bordered dressings under wraps. Use non-bordered dressing only. WOUND #4: - Calcaneus Wound Laterality: Left Topical: Betadine  1 x Per Day/15 Days Cheryl Rios, Cheryl Rios ([id number]) [id number] Page 11 of 12 Discharge Instructions: Apply betadine  as directed. Prim Dressing: Gauze 1 x Per Day/15 Days ary Discharge Instructions: As directed: dry 1. I would  recommend based on what we are seeing that we have the  patient going and continue to monitor for any signs of infection or worsening. I Cheryl believe that we are doing well here. I would recommend we continue with the Xeroform for the right hand. 2. In regard to the heel this I think just using Betadine  and dry gauze over it is good I Cheryl not even know if this is really open but I could not get all the eschar off but I did get off was completely healed underneath. 3. I am going to recommend that we use Prisma and a bordered foam dressing to the wounds on the gluteal region I think these hopefully will heal quite readily given appropriate treatment here. We will see patient back for reevaluation in 1 week here in the clinic. If anything worsens or changes patient will contact our office for additional recommendations. Electronic Signature(s) Signed: 06/01/2023 3:09:08 PM By: Bethena Ferraris PA-C Entered By: Bethena Ferraris on 06/01/2023 15:09:08 -------------------------------------------------------------------------------- SuperBill Details Patient Name: Date of Service: Cheryl Rios, Cheryl RIS M. 06/01/2023 Medical Record Number: [id number] Patient Account Number: [id number] Date of Birth/Sex: Treating RN: 02/24/1926 (89 y.o. 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 E11.621 Type 2 diabetes mellitus with foot ulcer I10 Essential (primary) hypertension Facility Procedures : CPT4 Code: [id number] Description: 11042 - DEB SUBQ TISSUE 20 SQ CM/< ICD-10 Diagnosis Description  L89.313 Pressure ulcer of right buttock, stage 3 S61.411D Laceration without foreign body of right hand, subsequent encounter Modifier: Quantity: 1 : CPT4 Code: [id number] Description: 97597 - DEBRIDE WOUND 1ST 20 SQ CM OR < ICD-10 Diagnosis Description L89.312 Pressure ulcer of right buttock, stage 2 L97.428 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 Modifier: Quantity: 1 Physician Procedures : CPT4 Code Description Modifier 11042 11042 - WC PHYS SUBQ TISS 20 SQ CM ICD-10 Diagnosis Description L89.313 Pressure ulcer of right buttock, stage 3 NAJMAH, CARRADINE ([id number]) [id number] Page [phone] Laceration without  foreign body of right hand, subsequent encounter Quantity: 1 12 of 12 : [id number] 97597 - WC PHYS DEBR WO ANESTH 20 SQ CM 1 ICD-10 Diagnosis Description L89.312 Pressure ulcer of right buttock, stage 2 L97.428 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

## 2023-06-02 ENCOUNTER — Ambulatory Visit (INDEPENDENT_AMBULATORY_CARE_PROVIDER_SITE_OTHER): Payer: MEDICARE | Admitting: Nurse Practitioner

## 2023-06-08 ENCOUNTER — Encounter: Payer: MEDICARE | Admitting: Physician Assistant

## 2023-06-08 DIAGNOSIS — E11621 Type 2 diabetes mellitus with foot ulcer: Secondary | ICD-10-CM | POA: Diagnosis not present

## 2023-06-08 NOTE — Progress Notes (Addendum)
[name], [name] ([id number]) [id number] Page 1 of 13 Visit Report for 06/08/2023 Arrival Information Details Patient Name: Date of Service: Cheryl Martinet, DO RIS M. 06/08/2023 3:15 PM Medical Record Number: [id number] Patient Account Number: [id number] Date of Birth/Sex: Treating RN: [birth year] (89 y.o. Cheryl Rios Primary Care Yulisa Chirico: Jerl Mina Other Clinician: Referring Tavio Biegel: Treating Inman Fettig/Extender: Florian Buff in Treatment: 2 Visit Information History Since Last Visit Added or deleted any medications: No Patient Arrived: Wheel Chair Any new allergies or adverse reactions: No Arrival Time: 15:54 Had a fall or experienced change in No Accompanied By: brother activities of daily living that may affect Transfer Assistance: Manual risk of falls: Patient Identification Verified: Yes Signs or symptoms of abuse/neglect since last visito No Secondary Verification Process Completed: Yes Hospitalized since last visit: No Patient Has Alerts: Yes Implantable device outside of the clinic excluding No Patient Alerts: Patient on Blood Thinner cellular tissue based products placed in the center type 2 diabetic since last visit: Fall Risk Has Dressing in Place as Prescribed: Yes Pain Present Now: No Electronic Signature(s) Signed: 06/09/2023 8:18:57 AM By: Yevonne Pax RN Entered By: Yevonne Pax on 06/08/2023 15:54:45 -------------------------------------------------------------------------------- Clinic Level of Care Assessment Details Patient Name: Date of Service: Cheryl Martinet, DO RIS M. 06/08/2023 3:15 PM Medical Record Number: [id number] Patient Account Number: [id number] Date of Birth/Sex: Treating RN: June 08, 1925 (89 y.o. Cheryl Rios Primary Care Jeston Junkins: Jerl Mina Other Clinician: Referring Chanson Teems: Treating Berley Gambrell/Extender: Florian Buff in Treatment: 2 Clinic Level of Care Assessment  Items TOOL 4 Quantity Score X- 1 0 Use when only an EandM is performed on FOLLOW-UP visit ASSESSMENTS - Nursing Assessment / Reassessment X- 1 10 Reassessment of Co-morbidities (includes updates in patient status) X- 1 5 Reassessment of Adherence to Treatment Plan SANYI, BOARD ([id number]) [id number] Page 2 of 13 ASSESSMENTS - Wound and Skin A ssessment / Reassessment []  - Simple Wound Assessment / Reassessment - one wound 0 X- 4 5 Complex Wound Assessment / Reassessment - multiple wounds []  - 0 Dermatologic / Skin Assessment (not related to wound area) ASSESSMENTS - Focused Assessment []  - 0 Circumferential Edema Measurements - multi extremities []  - 0 Nutritional Assessment / Counseling / Intervention []  - 0 Lower Extremity Assessment (monofilament, tuning fork, pulses) []  - 0 Peripheral Arterial Disease Assessment (using hand held doppler) ASSESSMENTS - Ostomy and/or Continence Assessment and Care []  - 0 Incontinence Assessment and Management []  - 0 Ostomy Care Assessment and Management (repouching, etc.) PROCESS - Coordination of Care X - Simple Patient / Family Education for ongoing care 1 15 []  - 0 Complex (extensive) Patient / Family Education for ongoing care []  - 0 Staff obtains Chiropractor, Records, T Results / Process Orders est []  - 0 Staff telephones HHA, Nursing Homes / Clarify orders / etc []  - 0 Routine Transfer to another Facility (non-emergent condition) []  - 0 Routine Hospital Admission (non-emergent condition) []  - 0 New Admissions / Manufacturing engineer / Ordering NPWT Apligraf, etc. , []  - 0 Emergency Hospital Admission (emergent condition) X- 1 10 Simple Discharge Coordination []  - 0 Complex (extensive) Discharge Coordination PROCESS - Special Needs []  - 0 Pediatric / Minor Patient Management []  - 0 Isolation Patient Management []  - 0 Hearing / Language / Visual special needs []  - 0 Assessment of  Community assistance (transportation, D/C planning, etc.) []  - 0 Additional assistance / Altered mentation []  - 0 Support Surface(s) Assessment (bed, cushion, seat, etc.) INTERVENTIONS -  Wound Cleansing / Measurement []  - 0 Simple Wound Cleansing - one wound X- 4 5 Complex Wound Cleansing - multiple wounds X- 1 5 Wound Imaging (photographs - any number of wounds) []  - 0 Wound Tracing (instead of photographs) []  - 0 Simple Wound Measurement - one wound X- 3 5 Complex Wound Measurement - multiple wounds INTERVENTIONS - Wound Dressings X - Small Wound Dressing one or multiple wounds 3 10 []  - 0 Medium Wound Dressing one or multiple wounds []  - 0 Large Wound Dressing one or multiple wounds []  - 0 Application of Medications - topical []  - 0 Application of Medications - injection INTERVENTIONS - Miscellaneous []  - 0 External ear exam TOMICKA, STEFANO ([id number]) [id number] Page 3 of 13 []  - 0 Specimen Collection (cultures, biopsies, blood, body fluids, etc.) []  - 0 Specimen(s) / Culture(s) sent or taken to Lab for analysis []  - 0 Patient Transfer (multiple staff / Michiel Sites Lift / Similar devices) []  - 0 Simple Staple / Suture removal (25 or less) []  - 0 Complex Staple / Suture removal (26 or more) []  - 0 Hypo / Hyperglycemic Management (close monitor of Blood Glucose) []  - 0 Ankle / Brachial Index (ABI) - do not check if billed separately X- 1 5 Vital Signs Has the patient been seen at the hospital within the last three years: Yes Total Score: 135 Level Of Care: New/Established - Level 4 Electronic Signature(s) Signed: 06/09/2023 8:18:57 AM By: Yevonne Pax RN Entered By: Yevonne Pax on 06/08/2023 16:26:35 -------------------------------------------------------------------------------- Encounter Discharge Information Details Patient Name: Date of Service: Cheryl Martinet, DO RIS M. 06/08/2023 3:15 PM Medical Record Number: [id number] Patient  Account Number: [id number] Date of Birth/Sex: Treating RN: 1925/06/20 (89 y.o. Cheryl Rios Primary Care Collis Thede: Jerl Mina Other Clinician: Referring Purl Claytor: Treating Arlet Marter/Extender: Florian Buff in Treatment: 2 Encounter Discharge Information Items Discharge Condition: Stable Ambulatory Status: Wheelchair Discharge Destination: Home Transportation: Private Auto Accompanied By: brother Schedule Follow-up Appointment: Yes Clinical Summary of Care: Electronic Signature(s) Signed: 06/08/2023 4:27:32 PM By: Yevonne Pax RN Entered By: Yevonne Pax on 06/08/2023 16:27:31 -------------------------------------------------------------------------------- Lower Extremity Assessment Details Patient Name: Date of Service: Cheryl Martinet, DO RIS M. 06/08/2023 3:15 PM Rickard Rhymes ([id number]) [id number] Page 4 of 13 Medical Record Number: [id number] Patient Account Number: [id number] Date of Birth/Sex: Treating RN: Jan 30, 1926 (89 y.o. Cheryl Rios Primary Care Brennen Camper: Jerl Mina Other Clinician: Referring Emersen Carroll: Treating Terry Abila/Extender: Florian Buff in Treatment: 2 Electronic Signature(s) Signed: 06/09/2023 8:18:57 AM By: Yevonne Pax RN Entered By: Yevonne Pax on 06/08/2023 16:06:25 -------------------------------------------------------------------------------- Multi Wound Chart Details Patient Name: Date of Service: Cheryl Martinet, DO RIS M. 06/08/2023 3:15 PM Medical Record Number: [id number] Patient Account Number: [id number] Date of Birth/Sex: Treating RN: [birth year] (89 y.o. Cheryl Rios Primary Care Arad Burston: Jerl Mina Other Clinician: Referring Scarlettrose Costilow: Treating Hudson Lehmkuhl/Extender: Florian Buff in Treatment: 2 Vital Signs Height(in): 61 Pulse(bpm): 109 Weight(lbs): 175 Blood Pressure(mmHg): 182/104 Body Mass Index(BMI): 33.1 Temperature(F): 97.5 Respiratory  Rate(breaths/min): 18 [1:Photos: No Photos Right Hand - Dorsum Wound Location: Skin Tear/Laceration Wounding Event: Skin Tear Primary Etiology: Sleep Apnea, Deep Vein Thrombosis, Sleep Apnea, Deep Vein Thrombosis, Sleep Apnea, Deep Vein Thrombosis, Comorbid History: Hypertension, Type  II Diabetes, History of pressure wounds, Gout, Rheumatoid Arthritis, Osteoarthritis Rheumatoid Arthritis, Osteoarthritis Rheumatoid Arthritis, Osteoarthritis 05/16/2023 Date Acquired: 2 Weeks of Treatment: Open Wound Status: No Wound Recurrence:  0.5x0.5x0.1 Measurements L x W x D (cm) 0.196 A (  cm) : rea 0.02 Volume (cm) : 96.90% % Reduction in Area: 96.80% % Reduction in Volume: Full Thickness Without Exposed Classification: Support Structures Medium Exudate A mount: Serosanguineous Exudate  Type: red, brown Exudate Color: Small (1-33%) Granulation A mount: Red, Pink Granulation Quality: Large (67-100%) Necrotic A mount: Fat Layer (Subcutaneous Tissue): Yes Fat Layer (Subcutaneous Tissue): Yes N/A Exposed Structures: Small (1-33%)  Epithelialization:] [2:No Photos Right Gluteus Pressure Injury Pressure Ulcer Hypertension, Type II Diabetes, History of pressure wounds, Gout, 04/05/2023 2 Open No 0.3x0.3x0.1 0.071 0.007 77.40% 77.40% Category/Stage III Medium Serosanguineous red,  brown Small (1-33%) Red Large (67-100%) None] [3:No Photos Right, Distal, Medial Gluteus Pressure Injury Pressure Ulcer Hypertension, Type II Diabetes, History of pressure wounds, Gout, 06/01/2023 1 Open No 1x0.5x0.1 0.393 0.039 -100.50% -95.00%  Category/Stage II Medium Serosanguineous red, brown Large (67-100%) Red None Present (0%) None] Wound Number: 4 N/A N/A Photos: No Photos N/A N/A Left Calcaneus N/A N/A Wound Location: Gradually Appeared N/A N/A Wounding Event: Diabetic Wound/Ulcer of the Lower N/A N/A Primary Etiology: Extremity Sleep Apnea, Deep Vein Thrombosis, N/A N/A Comorbid History: Hypertension, Type II Diabetes, AYVERIE, FAHR ([id number]) [id number] Page 5 of 13 History of pressure wounds, Gout, Rheumatoid Arthritis, Osteoarthritis 04/23/2023 N/A N/A Date Acquired: 1 N/A N/A Weeks of Treatment: Healed - Epithelialized N/A N/A Wound Status: No N/A N/A Wound Recurrence: 0x0x0 N/A N/A Measurements L x W x D (cm) 0 N/A N/A A (cm) : rea 0 N/A N/A Volume (cm) : 100.00% N/A N/A % Reduction in A rea: 100.00% N/A N/A % Reduction in Volume: Unable to visualize wound bed N/A N/A Classification: None Present N/A N/A Exudate A mount: N/A N/A N/A Exudate Type: N/A N/A N/A Exudate Color: None Present (0%) N/A N/A Granulation A mount: N/A N/A N/A Granulation Quality: None Present (0%) N/A N/A Necrotic A mount: Fascia: No N/A N/A Exposed Structures: Fat Layer (Subcutaneous Tissue): No Tendon: No Muscle: No Joint: No Bone: No Large (67-100%) N/A N/A Epithelialization: Treatment Notes Electronic Signature(s) Signed: 06/09/2023 8:18:57 AM By: Yevonne Pax RN Entered By: Yevonne Pax on 06/08/2023 16:06:29 -------------------------------------------------------------------------------- Multi-Disciplinary Care Plan Details Patient Name: Date of Service: Cheryl Martinet, DO RIS M. 06/08/2023 3:15 PM Medical Record Number: [id number] Patient Account Number: [id number] Date of Birth/Sex: Treating RN: 1926/01/13 (89 y.o. Cheryl Rios Primary Care Yulieth Carrender: Jerl Mina Other Clinician: Referring Trysten Bernard: Treating Nateisha Moyd/Extender: Florian Buff in Treatment: 2 Active Inactive Necrotic Tissue Nursing Diagnoses: Impaired tissue integrity related to necrotic/devitalized tissue Knowledge deficit related to management of necrotic/devitalized tissue Goals: Necrotic/devitalized tissue will be minimized in the wound bed Date Initiated: 05/19/2023 Target Resolution Date: 08/11/2023 Goal Status: Active Patient/caregiver will verbalize understanding of  reason and process for debridement of necrotic tissue Date Initiated: 05/19/2023 Date Inactivated: 05/19/2023 Target Resolution Date: 05/19/2023 Goal Status: Met Interventions: Assess patient pain level pre-, during and post procedure and prior to discharge Provide education on necrotic tissue and debridement process Treatment Activities: MACALAH, SKURKA ([id number]) [id number] Page 6 of 13 Apply topical anesthetic as ordered : 05/19/2023 Excisional debridement : 05/19/2023 Notes: Pressure Nursing Diagnoses: Knowledge deficit related to causes and risk factors for pressure ulcer development Knowledge deficit related to management of pressures ulcers Potential for impaired tissue integrity related to pressure, friction, moisture, and shear Goals: Patient will remain free from development of additional pressure ulcers Date Initiated: 05/19/2023 Target Resolution Date: 07/14/2023 Goal Status: Active Patient will remain free of pressure ulcers Date Initiated: 05/19/2023 Target Resolution Date:  08/11/2023 Goal Status: Active Patient/caregiver will verbalize risk factors for pressure ulcer development Date Initiated: 05/19/2023 Date Inactivated: 05/19/2023 Target Resolution Date: 05/19/2023 Goal Status: Met Patient/caregiver will verbalize understanding of pressure ulcer management Date Initiated: 05/19/2023 Date Inactivated: 05/19/2023 Target Resolution Date: 05/19/2023 Goal Status: Met Interventions: Assess: immobility, friction, shearing, incontinence upon admission and as needed Assess offloading mechanisms upon admission and as needed Assess potential for pressure ulcer upon admission and as needed Provide education on pressure ulcers Notes: Wound/Skin Impairment Nursing Diagnoses: Impaired tissue integrity Knowledge deficit related to ulceration/compromised skin integrity Goals: Ulcer/skin breakdown will have a volume reduction of 30% by week  4 Date Initiated: 05/19/2023 Target Resolution Date: 06/16/2023 Goal Status: Active Ulcer/skin breakdown will have a volume reduction of 50% by week 8 Date Initiated: 05/19/2023 Target Resolution Date: 07/14/2023 Goal Status: Active Ulcer/skin breakdown will have a volume reduction of 80% by week 12 Date Initiated: 05/19/2023 Target Resolution Date: 08/11/2023 Goal Status: Active Ulcer/skin breakdown will heal within 14 weeks Date Initiated: 05/19/2023 Target Resolution Date: 08/25/2023 Goal Status: Active Interventions: Assess patient/caregiver ability to obtain necessary supplies Assess patient/caregiver ability to perform ulcer/skin care regimen upon admission and as needed Assess ulceration(s) every visit Provide education on ulcer and skin care Notes: Electronic Signature(s) Signed: 06/09/2023 8:18:57 AM By: Yevonne Pax RN Entered By: Yevonne Pax on 06/08/2023 16:06:57 Rickard Rhymes ([id number]) [id number] Page 7 of 13 -------------------------------------------------------------------------------- Pain Assessment Details Patient Name: Date of Service: Cheryl Martinet, DO RIS M. 06/08/2023 3:15 PM Medical Record Number: [id number] Patient Account Number: [id number] Date of Birth/Sex: Treating RN: 1925/09/09 (89 y.o. Cheryl Rios Primary Care Bennette Hasty: Jerl Mina Other Clinician: Referring Starla Deller: Treating Jorden Mahl/Extender: Florian Buff in Treatment: 2 Active Problems Location of Pain Severity and Description of Pain Patient Has Paino No Site Locations Pain Management and Medication Current Pain Management: Electronic Signature(s) Signed: 06/09/2023 8:18:57 AM By: Yevonne Pax RN Entered By: Yevonne Pax on 06/08/2023 15:55:34 -------------------------------------------------------------------------------- Patient/Caregiver Education Details Patient Name: Date of Service: Cheryl Martinet, DO RIS M. 1/16/2025andnbsp3:15  PM Medical Record Number: [id number] Patient Account Number: [id number] Date of Birth/Gender: Treating RN: 12-29-1925 (89 y.o. Cheryl Rios Primary Care Physician: Jerl Mina Other Clinician: Referring Physician: Treating Physician/Extender: Florian Buff in Treatment: 2 ARMENIA, SELKIRK ([id number]) [id number] Page 8 of 13 Education Assessment Education Provided To: Patient Education Topics Provided Wound/Skin Impairment: Handouts: Caring for Your Ulcer Methods: Explain/Verbal Responses: State content correctly Electronic Signature(s) Signed: 06/09/2023 8:18:57 AM By: Yevonne Pax RN Entered By: Yevonne Pax on 06/08/2023 16:07:09 -------------------------------------------------------------------------------- Wound Assessment Details Patient Name: Date of Service: Cheryl Martinet, DO RIS M. 06/08/2023 3:15 PM Medical Record Number: [id number] Patient Account Number: [id number] Date of Birth/Sex: Treating RN: 1925/07/03 (89 y.o. Cheryl Rios Primary Care Patrick Salemi: Jerl Mina Other Clinician: Referring Evalynn Hankins: Treating Ashir Kunz/Extender: Florian Buff in Treatment: 2 Wound Status Wound Number: 1 Primary Skin T ear Etiology: Wound Location: Right Hand - Dorsum Wound Open Wounding Event: Skin Tear/Laceration Status: Date Acquired: 05/16/2023 Comorbid Sleep Apnea, Deep Vein Thrombosis, Hypertension, Type II Weeks Of Treatment: 2 History: Diabetes, History of pressure wounds, Gout, Rheumatoid Arthritis, Clustered Wound: No Osteoarthritis Wound Measurements Length: (cm) 0.5 Width: (cm) 0.5 Depth: (cm) 0.1 Area: (cm) 0.196 Volume: (cm) 0.02 % Reduction in Area: 96.9% % Reduction in Volume: 96.8% Epithelialization: Small (1-33%) Tunneling: No Undermining: No Wound Description Classification: Full Thickness Without Exposed Support Structures Exudate Amount: Medium Exudate Type:  Serosanguineous Exudate Color:  red, brown Foul Odor After Cleansing: No Slough/Fibrino No Wound Bed Granulation Amount: Small (1-33%) Exposed Structure Granulation Quality: Red, Pink Fat Layer (Subcutaneous Tissue) Exposed: Yes Necrotic Amount: Large (67-100%) Necrotic Quality: Adherent Slough Treatment Notes Wound #1 (Hand - Dorsum) Wound Laterality: Right SUFIA, MAHALA ([id number]) [id number] Page 9 of 13 Soap and Water Discharge Instruction: Gently cleanse wound with antibacterial soap, rinse and pat dry prior to dressing wounds Wound Cleanser Discharge Instruction: Wash your hands with soap and water. Remove old dressing, discard into plastic bag and place into trash. Cleanse the wound with Wound Cleanser prior to applying a clean dressing using gauze sponges, not tissues or cotton balls. Do not scrub or use excessive force. Pat dry using gauze sponges, not tissue or cotton balls. Peri-Wound Care Topical Primary Dressing Xeroform 4x4-HBD (in/in) Discharge Instruction: DOUBLE LAYER Apply Xeroform 4x4-HBD (in/in) as directed Secondary Dressing Gauze Discharge Instruction: As directed: dry Secured With Kerlix Roll Sterile or Non-Sterile 6-ply 4.5x4 (yd/yd) Discharge Instruction: Apply Kerlix as directed Stretch Net Dressing, Latex-free, Size 5, Small-Head / Shoulder / Thigh Compression Wrap Compression Stockings Add-Ons Electronic Signature(s) Signed: 06/09/2023 8:18:57 AM By: Yevonne Pax RN Entered By: Yevonne Pax on 06/08/2023 16:05:46 -------------------------------------------------------------------------------- Wound Assessment Details Patient Name: Date of Service: Cheryl Martinet, DO RIS M. 06/08/2023 3:15 PM Medical Record Number: [id number] Patient Account Number: [id number] Date of Birth/Sex: Treating RN: 21-Jan-1926 (89 y.o. Cheryl Rios Primary Care Berdie Malter: Jerl Mina Other Clinician: Referring Donis Kotowski: Treating  Kirsty Monjaraz/Extender: Florian Buff in Treatment: 2 Wound Status Wound Number: 2 Primary Pressure Ulcer Etiology: Wound Location: Right Gluteus Wound Open Wounding Event: Pressure Injury Status: Date Acquired: 04/05/2023 Comorbid Sleep Apnea, Deep Vein Thrombosis, Hypertension, Type II Weeks Of Treatment: 2 History: Diabetes, History of pressure wounds, Gout, Rheumatoid Arthritis, Clustered Wound: No Osteoarthritis Wound Measurements Length: (cm) 0.3 Width: (cm) 0.3 Depth: (cm) 0.1 Area: (cm) 0.071 Volume: (cm) 0.007 % Reduction in Area: 77.4% % Reduction in Volume: 77.4% Epithelialization: None Tunneling: No Undermining: No Wound Description Classification: Category/Stage III Exudate Amount: Medium Exudate Type: Serosanguineous Carboni, Anaia M ([id number]) Exudate Color: red, brown Foul Odor After Cleansing: No Slough/Fibrino Yes [id number] Page 10 of 13 Wound Bed Granulation Amount: Small (1-33%) Exposed Structure Granulation Quality: Red Fat Layer (Subcutaneous Tissue) Exposed: Yes Necrotic Amount: Large (67-100%) Necrotic Quality: Adherent Slough Treatment Notes Wound #2 (Gluteus) Wound Laterality: Right Cleanser Soap and Water Discharge Instruction: Gently cleanse wound with antibacterial soap, rinse and pat dry prior to dressing wounds Wound Cleanser Discharge Instruction: Wash your hands with soap and water. Remove old dressing, discard into plastic bag and place into trash. Cleanse the wound with Wound Cleanser prior to applying a clean dressing using gauze sponges, not tissues or cotton balls. Do not scrub or use excessive force. Pat dry using gauze sponges, not tissue or cotton balls. Peri-Wound Care Topical Primary Dressing Prisma 4.34 (in) Discharge Instruction: Moisten w/ surgi lube Secondary Dressing (BORDER) Zetuvit Plus SILICONE BORDER Dressing 4x4 (in/in) Discharge Instruction: Please do not put  silicone bordered dressings under wraps. Use non-bordered dressing only. Secured With Compression Wrap Compression Stockings Facilities manager) Signed: 06/09/2023 8:18:57 AM By: Yevonne Pax RN Entered By: Yevonne Pax on 06/08/2023 16:05:53 -------------------------------------------------------------------------------- Wound Assessment Details Patient Name: Date of Service: Cheryl Martinet, DO RIS M. 06/08/2023 3:15 PM Medical Record Number: [id number] Patient Account Number: [id number] Date of Birth/Sex: Treating RN: Aug 15, 1925 (89 y.o. Cheryl Rios Primary Care Lokelani Lutes: Jerl Mina Other Clinician: Referring Janaye [employer]:  Treating Donaldo Teegarden/Extender: Claudia Pollock Weeks in Treatment: 2 Wound Status Wound Number: 3 Primary Pressure Ulcer Etiology: Wound Location: Right, Distal, Medial Gluteus Wound Open Wounding Event: Pressure Injury Status: Date Acquired: 06/01/2023 Comorbid Sleep Apnea, Deep Vein Thrombosis, Hypertension, Type II Weeks Of Treatment: 1 History: Diabetes, History of pressure wounds, Gout, Rheumatoid Arthritis, Clustered Wound: No Osteoarthritis Wound Measurements AMILA, SCIUTO ([id number]) Length: (cm) 1 Width: (cm) 0.5 Depth: (cm) 0.1 Area: (cm) 0.393 Volume: (cm) 0.039 [id number] Page 11 of 13 % Reduction in Area: -100.5% % Reduction in Volume: -95% Epithelialization: None Tunneling: No Undermining: No Wound Description Classification: Category/Stage II Exudate Amount: Medium Exudate Type: Serosanguineous Exudate Color: red, brown Foul Odor After Cleansing: No Slough/Fibrino No Wound Bed Granulation Amount: Large (67-100%) Granulation Quality: Red Necrotic Amount: None Present (0%) Treatment Notes Wound #3 (Gluteus) Wound Laterality: Right, Medial, Distal Cleanser Soap and Water Discharge Instruction: Gently cleanse wound with antibacterial soap, rinse and pat dry prior to dressing  wounds Wound Cleanser Discharge Instruction: Wash your hands with soap and water. Remove old dressing, discard into plastic bag and place into trash. Cleanse the wound with Wound Cleanser prior to applying a clean dressing using gauze sponges, not tissues or cotton balls. Do not scrub or use excessive force. Pat dry using gauze sponges, not tissue or cotton balls. Peri-Wound Care Topical Primary Dressing Prisma 4.34 (in) Discharge Instruction: Moisten w/ surgi lube Secondary Dressing (BORDER) Zetuvit Plus SILICONE BORDER Dressing 4x4 (in/in) Discharge Instruction: Please do not put silicone bordered dressings under wraps. Use non-bordered dressing only. Secured With Compression Wrap Compression Stockings Facilities manager) Signed: 06/09/2023 8:18:57 AM By: Yevonne Pax RN Entered By: Yevonne Pax on 06/08/2023 16:06:02 -------------------------------------------------------------------------------- Wound Assessment Details Patient Name: Date of Service: Cheryl Martinet, DO RIS M. 06/08/2023 3:15 PM Medical Record Number: [id number] Patient Account Number: [id number] Date of Birth/Sex: Treating RN: 1925-08-09 (89 y.o. Cheryl Rios Primary Care Destine Ambroise: Jerl Mina Other Clinician: Referring Danylle Ouk: Treating Averly Ericson/Extender: Florian Buff in Treatment: 2 MARDIE, PAGANO ([id number]) [id number] Page 12 of 13 Wound Status Wound Number: 4 Primary Diabetic Wound/Ulcer of the Lower Extremity Etiology: Wound Location: Left Calcaneus Wound Healed - Epithelialized Wounding Event: Gradually Appeared Status: Date Acquired: 04/23/2023 Comorbid Sleep Apnea, Deep Vein Thrombosis, Hypertension, Type II Weeks Of Treatment: 1 History: Diabetes, History of pressure wounds, Gout, Rheumatoid Arthritis, Clustered Wound: No Osteoarthritis Wound Measurements Length: (cm) Width: (cm) Depth: (cm) Area: (cm) Volume: (cm) 0 %  Reduction in Area: 100% 0 % Reduction in Volume: 100% 0 Epithelialization: Large (67-100%) 0 Tunneling: No 0 Undermining: No Wound Description Classification: Unable to visualize wound bed Exudate Amount: None Present Foul Odor After Cleansing: No Slough/Fibrino No Wound Bed Granulation Amount: None Present (0%) Exposed Structure Necrotic Amount: None Present (0%) Fascia Exposed: No Fat Layer (Subcutaneous Tissue) Exposed: No Tendon Exposed: No Muscle Exposed: No Joint Exposed: No Bone Exposed: No Treatment Notes Wound #4 (Calcaneus) Wound Laterality: Left Cleanser Peri-Wound Care Topical Primary Dressing Secondary Dressing Secured With Compression Wrap Compression Stockings Add-Ons Electronic Signature(s) Signed: 06/09/2023 8:18:57 AM By: Yevonne Pax RN Entered By: Yevonne Pax on 06/08/2023 16:06:16 -------------------------------------------------------------------------------- Vitals Details Patient Name: Date of Service: Cheryl Martinet, DO RIS M. 06/08/2023 3:15 PM Medical Record Number: [id number] Patient Account Number: [id number] Date of Birth/Sex: Treating RN: May 18, 1926 (89 y.o. Cheryl Rios Primary Care Shenetta Schnackenberg: Jerl Mina Other Clinician: Referring Samara Stankowski: Treating Paulyne Mooty/Extender: Florian Buff in Treatment: 2 KASHANA, MOLENAAR ([id number])  [id number] Page 13 of 13 Vital Signs Time Taken: 15:54 Temperature (F): 97.5 Height (in): 61 Pulse (bpm): 109 Weight (lbs): 175 Respiratory Rate (breaths/min): 18 Body Mass Index (BMI): 33.1 Blood Pressure (mmHg): 182/104 Reference Range: 80 - 120 mg / dl Electronic Signature(s) Signed: 06/09/2023 8:18:57 AM By: Yevonne Pax RN Entered By: Yevonne Pax on 06/08/2023 15:55:10

## 2023-06-08 NOTE — Progress Notes (Addendum)
Cheryl, Rios ([id number]) [id number] Page 1 of 8 Visit Report for 06/08/2023 Chief Complaint Document Details Patient Name: Date of Service: Cheryl Martinet, DO RIS M. 06/08/2023 3:15 PM Medical Record Number: [id number] Patient Account Number: [id number] Date of Birth/Sex: Treating RN: 15-Aug-1925 (89 y.o. Cheryl Rios Primary Care Provider: Jerl Mina Other Clinician: Referring Provider: Treating Provider/Extender: Florian Buff in Treatment: 2 Information Obtained from: Patient Chief Complaint Right buttock, left heel, and right hand ulcers Electronic Signature(s) Signed: 06/08/2023 3:52:14 PM By: Allen Derry PA-C Entered By: Allen Derry on 06/08/2023 15:52:14 -------------------------------------------------------------------------------- HPI Details Patient Name: Date of Service: Cheryl Martinet, DO RIS M. 06/08/2023 3:15 PM Medical Record Number: [id number] Patient Account Number: [id number] Date of Birth/Sex: Treating RN: March 04, 1926 (89 y.o. Cheryl Rios Primary Care Provider: Jerl Mina Other Clinician: Referring Provider: Treating Provider/Extender: Florian Buff in Treatment: 2 History of Present Illness HPI Description: ADMISSION 05/18/2024 This is a a somewhat frail elderly woman who lives in her own home cared for predominantly by her brother. It seems that her most recent problems began on 02/10/2023 with a left hip fracture. Subsequently she developed a acute DVT ultimately requiring a thrombectomy of the left femoral vein, left external iliac and left common iliac veins, as well as the inferior vena cava. This was all done for a CT venogram that demonstrated large occlusive thrombus extending from the left femoral vein just above the knee to the infrarenal IVC, nonocclusive thrombus of the right common femoral vein and the right lower segment lung PE. She is now on Eliquis. She spent some time in  rehabilitation and in that timeframe developed a pressure ulcer on her right buttock. There are using silver alginate and bordered foam. Now that she is at home she has [facility] home care. More recently she traumatized her right dorsal hand on a wheelchair. It sounds as though this was initially a hematoma. With some portion of the surface epithelium did not hold. Past medical history includes hypothyroidism, type 2 diabetes, left hip fracture, exceptionally large left-sided DVT as noted above, now on Eliquis, gout. The patient is now at home she walks with a walker apparently sleeps over the recliner. They have some form of pressure relief cushion on the recliner. Apparently she sleeps in a recliner because of back pain 06-01-23 upon evaluation today patient presents for follow-up concerning her wounds. She does have a wound over the right dorsal hand, right gluteal region, right CHARLOTTE, MARCHEWKA ([id number]) [id number] Page 2 of 8 distal/medial gluteal region, and her left heel. The distal gluteal is new in the left heel was apparently there last week that we were not told about this when she came in for initial evaluation that was when Dr. Roxan Hockey saw her on admission I was not here. With that being said we will have a look at that today. 06-08-2023 upon evaluation today patient appears to be doing well currently in regard to her wounds all which are showing signs of improvement. I do believe that her making good headway here towards closure. Electronic Signature(s) Signed: 06/08/2023 4:33:02 PM By: Allen Derry PA-C Entered By: Allen Derry on 06/08/2023 16:33:02 -------------------------------------------------------------------------------- Physical Exam Details Patient Name: Date of Service: Cheryl Martinet, DO RIS M. 06/08/2023 3:15 PM Medical Record Number: [id number] Patient Account Number: [id number] Date of Birth/Sex: Treating RN: February 03, 1926 (89 y.o. Cheryl Rios Primary Care Provider: Jerl Mina Other Clinician: Referring Provider: Treating Provider/Extender: Claudia Pollock  Weeks in Treatment: 2 Constitutional Well-nourished and well-hydrated in no acute distress. Respiratory normal breathing without difficulty. Psychiatric this patient is able to make decisions and demonstrates good insight into disease process. Alert and Oriented x 3. pleasant and cooperative. Notes Upon inspection patient's wound bed actually showed signs of good granulation epithelization at this point. Fortunately I do not see any evidence of worsening overall and I do believe that the patient is making really good headway here towards closure which is excellent. Electronic Signature(s) Signed: 06/08/2023 4:33:19 PM By: Allen Derry PA-C Entered By: Allen Derry on 06/08/2023 16:33:19 -------------------------------------------------------------------------------- Physician Orders Details Patient Name: Date of Service: Cheryl Martinet, DO RIS M. 06/08/2023 3:15 PM Medical Record Number: [id number] Patient Account Number: [id number] Date of Birth/Sex: Treating RN: 05/04/1926 (89 y.o. 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 Home Health Home Health Company: - [facility] for wound care. May utilize formulary equivalent dressing for wound treatment orders unless otherwise specified. Home Health Nurse may visit PRN to address patients wound care needs. Scheduled days for dressing changes to be completed; exception, patient has scheduled wound care visit that day. **Please direct any NON-WOUND related issues/requests for orders to patient's Primary Care Physician. **If current dressing causes regression in wound condition, may D/C ordered dressing product/s and apply Normal Saline Moist Dressing daily until next Wound Healing Center or Other MD appointment. **Notify Wound Healing Center of regression in wound condition at [phone]. Bathing/ Shower/ Hygiene May shower with wound dressing protected with water repellent cover or cast protector. No tub bath. Anesthetic (Use 'Patient Medications' Section for Anesthetic Order Entry) Lidocaine applied to wound bed Off-Loading Turn and reposition every 2 hours - try not to sleep in recliner Other: - Recommended to wear thin socks to help protect heels/feet from rubbing on shoes Wound Treatment Wound #1 - Hand - Dorsum Wound Laterality: Right Cleanser: Soap and Water 1 x Per Day/30 Days Discharge Instructions: Gently cleanse wound with antibacterial soap, rinse and pat dry prior to dressing wounds Cleanser: Wound Cleanser 1 x Per Day/30 Days Discharge Instructions: Wash your hands with soap and water. Remove old dressing, discard into plastic bag and place into trash. Cleanse the wound with Wound Cleanser prior to applying a clean dressing using gauze sponges, not tissues or cotton balls. Do not scrub or use excessive force. Pat dry using gauze sponges, not tissue or cotton balls. Prim Dressing: Xeroform 4x4-HBD (in/in) 1 x Per Day/30 Days ary Discharge Instructions: DOUBLE LAYER  Apply Xeroform 4x4-HBD (in/in) as directed Secondary Dressing: Gauze 1 x Per Day/30 Days Discharge Instructions: As directed: dry Secured With: Kerlix Roll Sterile or Non-Sterile 6-ply 4.5x4 (yd/yd) 1 x Per Day/30 Days Discharge Instructions: Apply Kerlix as directed Secured With: Stretch Net Dressing, Latex-free, Size 5, Small-Head / Shoulder / Thigh 1 x Per Day/30 Days Wound #2 - Gluteus Wound  Laterality: Right Cleanser: Soap and Water 3 x Per Week/15 Days Discharge Instructions: Gently cleanse wound with antibacterial soap, rinse and pat dry prior to dressing wounds Cleanser: Wound Cleanser 3 x Per Week/15 Days Discharge Instructions: Wash your hands with soap and water. Remove old dressing, discard into plastic bag and place into trash. Cleanse the wound with Wound Cleanser prior to applying a clean dressing using gauze sponges, not tissues or cotton balls. Do not scrub or use excessive force. Pat dry using gauze sponges, not tissue or cotton balls. Prim Dressing: Prisma 4.34 (in) 3 x Per Week/15 Days ary Discharge Instructions: Moisten w/ surgi lube Secondary Dressing: (BORDER) Zetuvit Plus SILICONE BORDER Dressing 4x4 (in/in) 3 x Per Week/15 Days Discharge Instructions: Please do not put silicone bordered dressings under wraps. Use non-bordered dressing only. Wound #3 - Gluteus Wound Laterality: Right, Medial, Distal Cleanser: Soap and Water 3 x Per Week/15 Days Discharge Instructions: Gently cleanse wound with antibacterial soap, rinse and pat dry prior to dressing wounds Cleanser: Wound Cleanser 3 x Per Week/15 Days ANAMARIS, SARANTOS ([id number]) [id number] Page 4 of 8 Discharge Instructions: Wash your hands with soap and water. Remove old dressing, discard into plastic bag and place into trash. Cleanse the wound with Wound Cleanser prior to applying a clean dressing using gauze sponges, not tissues or cotton balls. Do not scrub or use excessive force. Pat  dry using gauze sponges, not tissue or cotton balls. Prim Dressing: Prisma 4.34 (in) 3 x Per Week/15 Days ary Discharge Instructions: Moisten w/ surgi lube Secondary Dressing: (BORDER) Zetuvit Plus SILICONE BORDER Dressing 4x4 (in/in) 3 x Per Week/15 Days Discharge Instructions: Please do not put silicone bordered dressings under wraps. Use non-bordered dressing only. Electronic Signature(s) Signed: 06/08/2023 6:05:15 PM By: Allen Derry PA-C Signed: 06/09/2023 8:18:57 AM By: Yevonne Pax RN Entered By: Yevonne Pax on 06/08/2023 16:10:47 -------------------------------------------------------------------------------- Problem List Details Patient Name: Date of Service: Cheryl Martinet, DO RIS M. 06/08/2023 3:15 PM Medical Record Number: [id number] Patient Account Number: [id number] Date of Birth/Sex: Treating RN: 1926/05/22 (89 y.o. 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 06/08/2023 3:15 PM Medical Record Number: [id number] Patient Account Number: [id number] Date of Birth/Sex: Treating RN: 24-Sep-1925 (89 y.o. Cheryl Rios Primary Care Provider: Jerl Mina Other Clinician: Referring Provider: Treating Provider/Extender: Florian Buff in Treatment: 2 Subjective Chief Complaint Information obtained from Patient Right buttock, left heel, and right hand ulcers History of Present Illness (HPI) ADMISSION 05/18/2024 This is a a somewhat frail elderly woman who lives in her own home cared for predominantly by her brother. It seems that her most recent problems began on 02/10/2023 with a left hip fracture. Subsequently  she developed a acute DVT ultimately requiring a thrombectomy of the left femoral vein, left external iliac and left common iliac veins, as well as the inferior vena cava. This was all done for a CT venogram that demonstrated large occlusive thrombus extending from the left femoral vein just above the knee to the infrarenal IVC, nonocclusive thrombus of the right common femoral vein and the right lower segment lung PE. She is now on Eliquis. She spent some time in rehabilitation and in that timeframe developed a pressure ulcer on her right buttock. There are using silver alginate and bordered foam. Now that she is at home she has [facility] home care. More recently she traumatized her right dorsal hand on a wheelchair. It sounds as though this was initially a hematoma. With some portion of the surface epithelium did not hold. Past medical history includes hypothyroidism, type 2 diabetes, left hip fracture, exceptionally large left-sided DVT as noted above, now on Eliquis, gout. The patient is now at home she walks with a walker apparently sleeps over the recliner. They have some form of pressure relief cushion on the recliner. Apparently she sleeps in a recliner because of back pain 06-01-23 upon  evaluation today patient presents for follow-up concerning her wounds. She does have a wound over the right dorsal hand, right gluteal region, right distal/medial gluteal region, and her left heel. The distal gluteal is new in the left heel was apparently there last week that we were not told about this when she came in for initial evaluation that was when Dr. Roxan Hockey saw her on admission I was not here. With that being said we will have a look at that today. 06-08-2023 upon evaluation today patient appears to be doing well currently in regard to her wounds all which are showing signs of improvement. I do believe that her making good headway here towards closure. Objective Constitutional Well-nourished and well-hydrated in no acute distress. Vitals Time Taken: 3:54 PM, Height: 61 in, Weight: 175 lbs, BMI: 33.1, Temperature: 97.5 F, Pulse: 109 bpm, Respiratory Rate: 18 breaths/min, Blood Pressure: 182/104 mmHg. Respiratory normal breathing without difficulty. Psychiatric this patient is able to make decisions and demonstrates good insight into disease process. Alert and Oriented x 3. pleasant and cooperative. General Notes: Upon inspection patient's wound bed actually showed signs of good granulation epithelization at this point. Fortunately I do not see any evidence of worsening overall and I do believe that the patient is making really good headway here towards closure which is excellent. ELYANNA, PELLETT ([id number]) [id number] Page 6 of 8 Integumentary (Hair, Skin) Wound #1 status is Open. Original cause of wound was Skin T ear/Laceration. The date acquired was: 05/16/2023. The wound has been in treatment 2 weeks. The wound is located on the Right Hand - Dorsum. The wound measures 0.5cm length x 0.5cm width x 0.1cm depth; 0.196cm^2 area and 0.02cm^3 volume. There is Fat Layer (Subcutaneous Tissue) exposed. There is no tunneling or undermining noted. There is a  medium amount of serosanguineous drainage noted. There is small (1-33%) red, pink granulation within the wound bed. There is a large (67-100%) amount of necrotic tissue within the wound bed including Adherent Slough. Wound #2 status is Open. Original cause of wound was Pressure Injury. The date acquired was: 04/05/2023. The wound has been in treatment 2 weeks. The wound is located on the Right Gluteus. The wound measures 0.3cm length x 0.3cm width x 0.1cm depth; 0.071cm^2 area and 0.007cm^3 volume. There is Fat Layer (Subcutaneous  Tissue) exposed. There is no tunneling or undermining noted. There is a medium amount of serosanguineous drainage noted. There is small (1-33%) red granulation within the wound bed. There is a large (67-100%) amount of necrotic tissue within the wound bed including Adherent Slough. Wound #3 status is Open. Original cause of wound was Pressure Injury. The date acquired was: 06/01/2023. The wound has been in treatment 1 weeks. The wound is located on the Right,Distal,Medial Gluteus. The wound measures 1cm length x 0.5cm width x 0.1cm depth; 0.393cm^2 area and 0.039cm^3 volume. There is no tunneling or undermining noted. There is a medium amount of serosanguineous drainage noted. There is large (67-100%) red granulation within the wound bed. There is no necrotic tissue within the wound bed. Wound #4 status is Healed - Epithelialized. Original cause of wound was Gradually Appeared. The date acquired was: 04/23/2023. The wound has been in treatment 1 weeks. The wound is located on the Left Calcaneus. The wound measures 0cm length x 0cm width x 0cm depth; 0cm^2 area and 0cm^3 volume. There is no tunneling or undermining noted. There is a none present amount of drainage noted. There is no granulation within the wound bed. There is no necrotic tissue within the wound bed. 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 Home Health: Home Health Company: - [facility] for wound care. May utilize formulary equivalent dressing for wound treatment orders unless otherwise specified. Home Health Nurse may visit PRN to address patients wound care needs. Scheduled days for dressing changes to be completed; exception, patient has scheduled wound care visit that day. **Please direct any NON-WOUND related issues/requests for orders to patient's Primary Care Physician. **If current dressing causes regression in wound condition, may D/C ordered dressing product/s and apply Normal Saline Moist Dressing daily until next Wound Healing Center or Other MD appointment. **Notify Wound Healing Center of regression in wound condition at [phone]. Bathing/ Shower/ Hygiene: May shower with wound dressing protected with water repellent cover or cast protector. No tub bath. Anesthetic (Use 'Patient Medications' Section for Anesthetic Order Entry): Lidocaine applied to wound bed Off-Loading: Turn and reposition every 2 hours - try not to sleep in recliner Other: - Recommended to wear thin socks to help protect heels/feet from rubbing on shoes WOUND #1: - Hand - Dorsum Wound Laterality: Right Cleanser: Soap and Water 1 x Per Day/30 Days Discharge Instructions: Gently cleanse wound with antibacterial soap, rinse and pat dry prior to dressing wounds Cleanser: Wound Cleanser 1 x Per Day/30 Days Discharge Instructions: Wash your hands with soap and water. Remove old dressing, discard into plastic bag and place into trash. Cleanse the wound with Wound Cleanser prior to applying a clean dressing using gauze sponges, not tissues  or cotton balls. Do not scrub or use excessive force. Pat dry using gauze sponges, not tissue or cotton balls. Prim Dressing: Xeroform 4x4-HBD (in/in) 1 x Per Day/30 Days ary Discharge Instructions: DOUBLE LAYER Apply Xeroform 4x4-HBD (in/in) as directed Secondary Dressing: Gauze 1 x Per Day/30 Days Discharge Instructions: As directed: dry Secured With: Kerlix Roll Sterile or Non-Sterile 6-ply 4.5x4 (yd/yd) 1 x Per Day/30 Days Discharge Instructions: Apply Kerlix as directed Secured With: [employer], Latex-free,  Size 5, Small-Head / Shoulder / Thigh 1 x Per Day/30 Days WOUND #2: - Gluteus Wound Laterality: Right Cleanser: Soap and Water 3 x Per Week/15 Days Discharge Instructions: Gently cleanse wound with antibacterial soap, rinse and pat dry prior to dressing wounds Cleanser: Wound Cleanser 3 x Per Week/15 Days Discharge Instructions: Wash your hands with soap and water. Remove old dressing, discard into plastic bag and place into trash. Cleanse the wound with Wound Cleanser prior to applying a clean dressing using gauze sponges, not tissues or cotton balls. Do not scrub or use excessive force. Pat dry using gauze sponges, not tissue or cotton balls. Prim Dressing: Prisma 4.34 (in) 3 x Per Week/15 Days ary Discharge Instructions: Moisten w/ surgi lube Secondary Dressing: (BORDER) Zetuvit Plus SILICONE BORDER Dressing 4x4 (in/in) 3 x Per Week/15 Days Discharge Instructions: Please do not put silicone bordered dressings under wraps. Use non-bordered dressing only. WOUND #3: - Gluteus Wound Laterality: Right, Medial, Distal Cleanser: Soap and Water 3 x Per Week/15 Days NALEIA, GARDE ([id number]) [phone].pdf Page 7 of 8 Discharge Instructions: Gently cleanse wound with antibacterial soap, rinse and pat dry prior to dressing wounds Cleanser: Wound Cleanser 3 x Per Week/15 Days Discharge Instructions: Wash your hands with soap and water. Remove old  dressing, discard into plastic bag and place into trash. Cleanse the wound with Wound Cleanser prior to applying a clean dressing using gauze sponges, not tissues or cotton balls. Do not scrub or use excessive force. Pat dry using gauze sponges, not tissue or cotton balls. Prim Dressing: Prisma 4.34 (in) 3 x Per Week/15 Days ary Discharge Instructions: Moisten w/ surgi lube Secondary Dressing: (BORDER) Zetuvit Plus SILICONE BORDER Dressing 4x4 (in/in) 3 x Per Week/15 Days Discharge Instructions: Please do not put silicone bordered dressings under wraps. Use non-bordered dressing only. 1. I would recommend the patient should continue to monitor for any signs of infection or worsening. Based on what I am seeing I am going to recommend we use Xeroform I do not think we need collagen any longer at any wound location. 2. I am going to recommend that we continue with the bordered foam dressing to cover. We will see patient back for reevaluation in 1 week here in the clinic. If anything worsens or changes patient will contact our office for additional recommendations. Electronic Signature(s) Signed: 06/08/2023 4:33:47 PM By: Allen Derry PA-C Entered By: Allen Derry on 06/08/2023 16:33:47 -------------------------------------------------------------------------------- SuperBill Details Patient Name: Date of Service: Cheryl Martinet, DO RIS M. 06/08/2023 Medical Record Number: [id number] Patient Account Number: [id number] Date of Birth/Sex: Treating RN: 1926-01-29 (89 y.o. Cheryl Rios Primary Care Provider: Jerl Mina Other Clinician: Referring Provider: Treating Provider/Extender: Florian Buff in Treatment: 2 Diagnosis Coding ICD-10 Codes Code Description [id number] Pressure ulcer of right buttock, stage 2 L89.313 Pressure ulcer of right buttock, stage 3 S61.411D Laceration without foreign body of right hand, subsequent encounter L97.428 Non-pressure chronic ulcer of left  heel and midfoot with other specified severity E11.621 Type 2 diabetes mellitus with foot ulcer I10 Essential (primary) hypertension Facility Procedures : CPT4 Code: [id number] Description: 99214 - WOUND CARE VISIT-LEV 4 EST PT Modifier: Quantity: 1 Physician Procedures : CPT4: Description Modifier Code [id number] 99213 - WC PHYS LEVEL 3 - EST PT ICD-10 Diagnosis Description L89.312 Pressure ulcer of right buttock, stage 2 L89.313 Pressure ulcer of right buttock, stage 3 S61.411D Laceration without foreign body of right  hand, subsequent encounter L97.428 Non-pressure chronic ulcer of left heel and  midfoot with other specified severity Quantity: 1 : [place] CPT4: Z6109 Visit complexity inherent to [facility] 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

## 2023-06-15 ENCOUNTER — Ambulatory Visit (INDEPENDENT_AMBULATORY_CARE_PROVIDER_SITE_OTHER): Payer: MEDICARE | Admitting: Podiatry

## 2023-06-15 ENCOUNTER — Encounter: Payer: Self-pay | Admitting: Podiatry

## 2023-06-15 VITALS — Ht 61.0 in | Wt 172.8 lb

## 2023-06-15 DIAGNOSIS — B351 Tinea unguium: Secondary | ICD-10-CM | POA: Diagnosis not present

## 2023-06-15 DIAGNOSIS — E1149 Type 2 diabetes mellitus with other diabetic neurological complication: Secondary | ICD-10-CM

## 2023-06-15 DIAGNOSIS — M79676 Pain in unspecified toe(s): Secondary | ICD-10-CM

## 2023-06-15 DIAGNOSIS — Q828 Other specified congenital malformations of skin: Secondary | ICD-10-CM

## 2023-06-15 DIAGNOSIS — L84 Corns and callosities: Secondary | ICD-10-CM

## 2023-06-15 NOTE — Progress Notes (Signed)
Subjective:  Patient ID: Cheryl Rios, female    DOB: 30-Sep-1925,  MRN: [id number]  Cheryl Rios presents to clinic today for: at risk foot care with history of diabetic neuropathy and painful, elongated thickened toenails x 10 which are symptomatic when wearing enclosed shoe gear. This interferes with his/her daily activities. Her son is present during today's visit. Son states her toes improved with course of oral antibiotics and there was no need to see Dr. Logan Bores for follow up. Son points out left 5th digit is a little red. Chief Complaint  Patient presents with   Nail Problem    Pt is here for [facility] last A1C was 6, PCP is Dr Burnett Sheng and LOV was 2 weeks ago.   PCP is Jerl Mina, MD.  No Known Allergies  Review of Systems: Negative except as noted in the HPI.  Objective:  There were no vitals filed for this visit.  Cheryl Rios is a pleasant 89 y.o. female WD, WN in NAD. AAO x 3.  Vascular Examination: Capillary refill time immediate b/l. Vascular status intact b/l with palpable pedal pulses. Pedal hair absent b/l. No pain with calf compression b/l. Skin temperature gradient WNL b/l. No cyanosis or clubbing b/l. No ischemia or gangrene noted b/l. Dependent edema noted b/l LE.  Neurological Examination: Sensation grossly intact b/l with 10 gram monofilament. Vibratory sensation intact b/l. Pt has subjective symptoms of neuropathy.  Dermatological Examination: Pedal skin with normal turgor, texture and tone b/l.  No open wounds. No interdigital macerations.   Toenails 1-5 b/l thick, discolored, elongated with subungual debris and pain on dorsal palpation.   There is noted onchyolysis of nailplate of right great toe.  The nailbed(s) remain(s) intact. There is no erythema, no edema, no drainage, no underlying fluctuance.  Resolved porokeratotic lesion(s) submet head 5 b/l due to wheel chair use. No erythema, no edema, no drainage, no fluctuance.   Musculoskeletal  Examination: Muscle strength 5/5 to all lower extremity muscle groups bilaterally. No pain, crepitus or joint limitation noted with ROM bilateral LE. 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 There is blanchable erythema of left 5th digit. No pain on palpation. Plantarflexed metatarsal(s) 5th metatarsal head b/l lower extremities. HAV with bunion deformity noted b/l LE. Utilizes transport chair for mobility assistance.     Latest Ref Rng & Units 02/10/2023    4:35 PM  Hemoglobin A1C  Hemoglobin-A1c 4.8 - 5.6 % 6.2    Assessment/Plan: 1. Pain due to onychomycosis of toenail   2. Type II diabetes mellitus with neurological manifestations [facility])     -Patient was evaluated today. All questions/concerns addressed on today's visit. -Toenails were debrided in length and girth 2-5 bilaterally and left great toe with sterile nail nippers and dremel without iatrogenic bleeding.  -Loose nailplate R hallux gently debrided to level of adherence. Digit cleansed with alcohol. No further treatment required by patient. -Dispensed tube foam for bilateral 5th toes to alleviate pressure. Avoid leather shoes gear. Call office if she has any problems. -Patient/POA to call should there be question/concern in the interim.   Return in about 3 months (around 09/13/2023).  Freddie Breech, DPM      [place] LOCATION: [address]  [place], [place] [address]                   Office [phone]   [facility] LOCATION: [address] [place], [place] [address] Office [phone]

## 2023-06-16 ENCOUNTER — Encounter: Payer: MEDICARE | Admitting: Physician Assistant

## 2023-06-16 DIAGNOSIS — E11621 Type 2 diabetes mellitus with foot ulcer: Secondary | ICD-10-CM | POA: Diagnosis not present

## 2023-06-19 ENCOUNTER — Ambulatory Visit (INDEPENDENT_AMBULATORY_CARE_PROVIDER_SITE_OTHER): Payer: MEDICARE | Admitting: Nurse Practitioner

## 2023-06-19 ENCOUNTER — Other Ambulatory Visit (INDEPENDENT_AMBULATORY_CARE_PROVIDER_SITE_OTHER): Payer: Self-pay | Admitting: Nurse Practitioner

## 2023-06-19 ENCOUNTER — Ambulatory Visit (INDEPENDENT_AMBULATORY_CARE_PROVIDER_SITE_OTHER): Payer: MEDICARE

## 2023-06-19 ENCOUNTER — Encounter (INDEPENDENT_AMBULATORY_CARE_PROVIDER_SITE_OTHER): Payer: Self-pay | Admitting: Nurse Practitioner

## 2023-06-19 VITALS — BP 142/81 | HR 86 | Resp 15

## 2023-06-19 DIAGNOSIS — E119 Type 2 diabetes mellitus without complications: Secondary | ICD-10-CM | POA: Diagnosis not present

## 2023-06-19 DIAGNOSIS — I82422 Acute embolism and thrombosis of left iliac vein: Secondary | ICD-10-CM | POA: Diagnosis not present

## 2023-06-19 DIAGNOSIS — I82412 Acute embolism and thrombosis of left femoral vein: Secondary | ICD-10-CM | POA: Diagnosis not present

## 2023-06-19 DIAGNOSIS — I2699 Other pulmonary embolism without acute cor pulmonale: Secondary | ICD-10-CM

## 2023-06-19 NOTE — Progress Notes (Signed)
Subjective:    Patient ID: Cheryl Rios, female    DOB: May 17, 1926, 89 y.o.   MRN: [id number] Chief Complaint  Patient presents with   New Patient (Initial Visit)    IVC and ble dvt evaluation follow up    The patient is a 89 year old female who presents today for follow-up evaluation after an extensive left lower extremity DVT the patient underwent thrombectomy.  She continues to have some swelling in the left leg but it is improved from where it was previously.  Currently there are no open wounds or ulcerations.  Currently she is elevating her lower extremities and continuing to work with physical therapy for activity.  In addition her family helps to walk with her as well.  Today noninvasive studies show there is continued thrombus in the left lower extremity although there is evidence of healing such as recannulization and aneurysm as well as a smaller clot burden    Review of Systems  Cardiovascular:  Positive for leg swelling.  Neurological:  Positive for weakness.  All other systems reviewed and are negative.      Objective:   Physical Exam Vitals reviewed.  HENT:     Head: Normocephalic.  Cardiovascular:     Rate and Rhythm: Normal rate.     Pulses: Normal pulses.  Pulmonary:     Effort: Pulmonary effort is normal.  Musculoskeletal:     Left lower leg: Edema present.  Skin:    General: Skin is warm and dry.  Neurological:     Mental Status: She is alert and oriented to person, place, and time.  Psychiatric:        Mood and Affect: Mood normal.        Behavior: Behavior normal.        Thought Content: Thought content normal.        Judgment: Judgment normal.     BP (!) 142/81   Pulse 86   Resp 15   Past Medical History:  Diagnosis Date   Arrhythmia    Arthritis    Diabetes mellitus without complication (HCC)    Diverticulosis    Dysrhythmia    Edema of both feet    HOH (hard of hearing)    Hypertension    Hypothyroidism    Neuropathy     Osteoporosis    Sleep apnea    OSA--Use C-PAP   Thyroid disease     Social History   Socioeconomic History   Marital status: Widowed    Spouse name: Not on file   Number of children: Not on file   Years of education: Not on file   Highest education level: Not on file  Occupational History   Not on file  Tobacco Use   Smoking status: Never   Smokeless tobacco: Never  Vaping Use   Vaping status: Never Used  Substance and Sexual Activity   Alcohol use: No   Drug use: No   Sexual activity: Never  Other Topics Concern   Not on file  Social History Narrative   Not on file   Social Drivers of Health   Financial Resource Strain: Low Risk  (01/19/2023)   Received from [facility]   Overall Financial Resource Strain (CARDIA)    Difficulty of Paying Living Expenses: Not hard at all  Food Insecurity: No Food Insecurity (04/26/2023)   Hunger Vital Sign    Worried About Running Out of Food in the Last Year: Never true  Ran Out of Food in the Last Year: Never true  Transportation Needs: No Transportation Needs (04/26/2023)   PRAPARE - Administrator, Civil Service (Medical): No    Lack of Transportation (Non-Medical): No  Physical Activity: Not on file  Stress: Not on file  Social Connections: Not on file  Intimate Partner Violence: Not At Risk (04/26/2023)   Humiliation, Afraid, Rape, and Kick questionnaire    Fear of Current or Ex-Partner: No    Emotionally Abused: No    Physically Abused: No    Sexually Abused: No    Past Surgical History:  Procedure Laterality Date   ABDOMINAL HYSTERECTOMY     CATARACT EXTRACTION W/PHACO Right 07/19/2016   Procedure: CATARACT EXTRACTION PHACO AND INTRAOCULAR LENS PLACEMENT (IOC);  Surgeon: Lockie Mola, MD;  Location: [facility] ORS;  Service: Ophthalmology;  Laterality: Right;  [place] 0:55.8 AP% 14.3 CDE 9.52 Fluid pack lot # [id number]   CATARACT EXTRACTION W/PHACO Left 08/18/2016   Procedure: CATARACT  EXTRACTION PHACO AND INTRAOCULAR LENS PLACEMENT (IOC);  Surgeon: Lockie Mola, MD;  Location: [facility] ORS;  Service: Ophthalmology;  Laterality: Left;  US1:03.1 AP%20.6 CDE13.2 Fluid pack lot # [id number]   CHOLECYSTECTOMY     ERCP N/A 02/07/2017   Procedure: ENDOSCOPIC RETROGRADE CHOLANGIOPANCREATOGRAPHY (ERCP);  Surgeon: Midge Minium, MD;  Location: [facility] ENDOSCOPY;  Service: Endoscopy;  Laterality: N/A;   FEMUR IM NAIL Left 02/11/2023   Procedure: INTRAMEDULLARY (IM) NAIL FEMORAL;  Surgeon: Campbell Stall, MD;  Location: [facility] ORS;  Service: Orthopedics;  Laterality: Left;   GALLBLADDER SURGERY     HEMORRHOID SURGERY     JOINT REPLACEMENT Bilateral    KNEE REPLACEMENT   KNEE SURGERY Right    PERIPHERAL VASCULAR THROMBECTOMY Left 04/28/2023   Procedure: PERIPHERAL VASCULAR THROMBECTOMY;  Surgeon: Renford Dills, MD;  Location: [facility] INVASIVE CV LAB;  Service: Cardiovascular;  Laterality: Left;    Family History  Problem Relation Age of Onset   Breast cancer Neg Hx     No Known Allergies     Latest Ref Rng & Units 04/29/2023    2:39 AM 04/28/2023    4:45 AM 04/27/2023    4:42 AM  CBC  WBC 4.0 - 10.5 K/uL 6.3  6.9  6.9   Hemoglobin 12.0 - 15.0 g/dL 9.5  19.1  47.8   Hematocrit 36.0 - 46.0 % 29.1  33.4  32.0   Platelets 150 - 400 K/uL 186  191  179       CMP     Component Value Date/Time   NA 134 (L) 04/30/2023 0338   NA 142 07/06/2012 0431   K 3.9 04/30/2023 0338   K 4.5 07/06/2012 0431   CL 101 04/30/2023 0338   CL 111 (H) 07/06/2012 0431   CO2 26 04/30/2023 0338   CO2 27 07/06/2012 0431   GLUCOSE 125 (H) 04/30/2023 0338   GLUCOSE 102 (H) 07/06/2012 0431   BUN 16 04/30/2023 0338   BUN 17 07/06/2012 0431   CREATININE 0.83 04/30/2023 0338   CREATININE 0.90 07/06/2012 0431   CALCIUM 9.2 04/30/2023 0338   CALCIUM 8.7 07/06/2012 0431   PROT 7.5 04/25/2023 1738   ALBUMIN 3.7 04/25/2023 1738   AST 31 04/25/2023 1738   ALT 25 04/25/2023 1738   ALKPHOS 108  04/25/2023 1738   BILITOT 0.6 04/25/2023 1738   GFRNONAA >60 04/30/2023 0338   GFRNONAA 58 (L) 07/06/2012 0431     No results found.     Assessment &  Plan:   1. Acute deep vein thrombosis (DVT) of femoral vein of left lower extremity (HCC) (Primary) Recommend:   The patient is post successful thrombectomy IVC filter is not indicated at present.  Patient's duplex ultrasound of the venous system shows DVT in the left lower extremity.  The patient is on anticoagulation   Elevation was stressed, such as the use of a recliner.  I have reviewed with the patient DVT and post phlebitic changes such as swelling and why it  causes symptoms such as pain.  I recommended to the patient to wear graduated compression stockings, beginning after three full days of anticoagulation.  Graduated compression should be worn on a daily basis. The patient should wear compression beginning first thing in the morning and removing them in the evening. The patient is instructed specifically not to sleep in the stockings.  In addition, behavioral modification including elevation during the day and avoidance of prolonged dependency will be initiated.    The patient will continue anticoagulation for now as there have not been any problems or complications from anticoagulation therapy at this point.  The patient will follow-up with me with noninvasive studies as ordered.  2. Pulmonary embolism on right [facility]) Given the pulmonary embolism patient is recommended to be on Eliquis for 1 year  3. Type 2 diabetes mellitus without complication, without long-term current use of insulin (HCC) Continue hypoglycemic medications as already ordered, these medications have been reviewed and there are no changes at this time.  Hgb A1C to be monitored as already arranged by primary service   Current Outpatient Medications on File Prior to Visit  Medication Sig Dispense Refill   acetaminophen (TYLENOL) 500 MG tablet Take 2  tablets (1,000 mg total) by mouth every 8 (eight) hours as needed for mild pain, fever or headache.     allopurinol (ZYLOPRIM) 100 MG tablet Take 1.5 tablets by mouth daily.     ammonium lactate (AMLACTIN) 12 % cream Apply 1 Application topically as needed for dry skin. 385 g 2   apixaban (ELIQUIS) 5 MG TABS tablet Take 1 tablet (5 mg total) by mouth 2 (two) times daily. 60 tablet 5   APIXABAN (ELIQUIS) VTE STARTER PACK (10MG  AND 5MG ) Take as directed on package: start with two-5mg  tablets twice daily for 7 days. On day 8, switch to one-5mg  tablet twice daily. 74 each 0   DULoxetine (CYMBALTA) 20 MG capsule Take 40 mg by mouth daily.     feeding supplement, GLUCERNA SHAKE, (GLUCERNA SHAKE) LIQD Take 237 mLs by mouth 3 (three) times daily between meals.     folic acid (FOLVITE) 1 MG tablet Take 1 mg by mouth daily.     gabapentin (NEURONTIN) 100 MG capsule Take 200 mg by mouth 3 (three) times daily.     ketoconazole (NIZORAL) 2 % cream Apply 1 Application topically daily. 60 g 2   levothyroxine (SYNTHROID, LEVOTHROID) 75 MCG tablet TAKE 1 TABLET BY MOUTH ONCE DAILY ON EMPTY STOMACH WITH WATER 30-60 MIN BEFORE BREAKFAST     Multiple Vitamin (MULTIVITAMIN WITH MINERALS) TABS tablet Take 1 tablet by mouth daily.     nystatin cream (MYCOSTATIN) Apply 1 Application topically 2 (two) times daily.     No current facility-administered medications on file prior to visit.    There are no Patient Instructions on file for this visit. No follow-ups on file.   Georgiana Spinner, NP

## 2023-06-19 NOTE — Progress Notes (Incomplete)
Subjective:    Patient ID: Cheryl Rios, female    DOB: [birth year], 89 y.o.   MRN: [id number] Chief Complaint  Patient presents with  . New Patient (Initial Visit)    IVC and ble dvt evaluation follow up    HPI  Review of Systems     Objective:   Physical Exam  BP (!) 142/81   Pulse 86   Resp 15   Past Medical History:  Diagnosis Date  . Arrhythmia   . Arthritis   . Diabetes mellitus without complication (HCC)   . Diverticulosis   . Dysrhythmia   . Edema of both feet   . HOH (hard of hearing)   . Hypertension   . Hypothyroidism   . Neuropathy   . Osteoporosis   . Sleep apnea    OSA--Use C-PAP  . Thyroid disease     Social History   Socioeconomic History  . Marital status: Widowed    Spouse name: Not on file  . Number of children: Not on file  . Years of education: Not on file  . Highest education level: Not on file  Occupational History  . Not on file  Tobacco Use  . Smoking status: Never  . Smokeless tobacco: Never  Vaping Use  . Vaping status: Never Used  Substance and Sexual Activity  . Alcohol use: No  . Drug use: No  . Sexual activity: Never  Other Topics Concern  . Not on file  Social History Narrative  . Not on file   Social Drivers of Health   Financial Resource Strain: Low Risk  (01/19/2023)   Received from [facility]   Overall Financial Resource Strain (CARDIA)   . Difficulty of Paying Living Expenses: Not hard at all  Food Insecurity: No Food Insecurity (04/26/2023)   Hunger Vital Sign   . Worried About Programme researcher, broadcasting/film/video in the Last Year: Never true   . Ran Out of Food in the Last Year: Never true  Transportation Needs: No Transportation Needs (04/26/2023)   PRAPARE - Transportation   . Lack of Transportation (Medical): No   . Lack of Transportation (Non-Medical): No  Physical Activity: Not on file  Stress: Not on file  Social Connections: Not on file  Intimate Partner Violence: Not At Risk (04/26/2023)    Humiliation, Afraid, Rape, and Kick questionnaire   . Fear of Current or Ex-Partner: No   . Emotionally Abused: No   . Physically Abused: No   . Sexually Abused: No    Past Surgical History:  Procedure Laterality Date  . ABDOMINAL HYSTERECTOMY    . CATARACT EXTRACTION W/PHACO Right 07/19/2016   Procedure: CATARACT EXTRACTION PHACO AND INTRAOCULAR LENS PLACEMENT (IOC);  Surgeon: Lockie Mola, MD;  Location: [facility] ORS;  Service: Ophthalmology;  Laterality: Right;  [place] 0:55.8 AP% 14.3 CDE 9.52 Fluid pack lot # [id number] H  . CATARACT EXTRACTION W/PHACO Left 08/18/2016   Procedure: CATARACT EXTRACTION PHACO AND INTRAOCULAR LENS PLACEMENT (IOC);  Surgeon: Lockie Mola, MD;  Location: [facility] ORS;  Service: Ophthalmology;  Laterality: Left;  US1:03.1 AP%20.6 CDE13.2 Fluid pack lot # [id number]  . CHOLECYSTECTOMY    . ERCP N/A 02/07/2017   Procedure: ENDOSCOPIC RETROGRADE CHOLANGIOPANCREATOGRAPHY (ERCP);  Surgeon: Midge Minium, MD;  Location[facility] ENDOSCOPY;  Service: Endoscopy;  Laterality: N/A;  . FEMUR IM NAIL Left 02/11/2023   Procedure: INTRAMEDULLARY (IM) NAIL FEMORAL;  Surgeon: Campbell Stall, MD;  Location: [facility] ORS;  Service: Orthopedics;  Laterality: Left;  .  GALLBLADDER SURGERY    . HEMORRHOID SURGERY    . JOINT REPLACEMENT Bilateral    KNEE REPLACEMENT  . KNEE SURGERY Right   . PERIPHERAL VASCULAR THROMBECTOMY Left 04/28/2023   Procedure: PERIPHERAL VASCULAR THROMBECTOMY;  Surgeon: Renford Dills, MD;  Location: [facility] INVASIVE CV LAB;  Service: Cardiovascular;  Laterality: Left;    Family History  Problem Relation Age of Onset  . Breast cancer Neg Hx     No Known Allergies     Latest Ref Rng & Units 04/29/2023    2:39 AM 04/28/2023    4:45 AM 04/27/2023    4:42 AM  CBC  WBC 4.0 - 10.5 K/uL 6.3  6.9  6.9   Hemoglobin 12.0 - 15.0 g/dL 9.5  16.1  09.6   Hematocrit 36.0 - 46.0 % 29.1  33.4  32.0   Platelets 150 - 400 K/uL 186  191  179       CMP      Component Value Date/Time   NA 134 (L) 04/30/2023 0338   NA 142 07/06/2012 0431   K 3.9 04/30/2023 0338   K 4.5 07/06/2012 0431   CL 101 04/30/2023 0338   CL 111 (H) 07/06/2012 0431   CO2 26 04/30/2023 0338   CO2 27 07/06/2012 0431   GLUCOSE 125 (H) 04/30/2023 0338   GLUCOSE 102 (H) 07/06/2012 0431   BUN 16 04/30/2023 0338   BUN 17 07/06/2012 0431   CREATININE 0.83 04/30/2023 0338   CREATININE 0.90 07/06/2012 0431   CALCIUM 9.2 04/30/2023 0338   CALCIUM 8.7 07/06/2012 0431   PROT 7.5 04/25/2023 1738   ALBUMIN 3.7 04/25/2023 1738   AST 31 04/25/2023 1738   ALT 25 04/25/2023 1738   ALKPHOS 108 04/25/2023 1738   BILITOT 0.6 04/25/2023 1738   GFRNONAA >60 04/30/2023 0338   GFRNONAA 58 (L) 07/06/2012 0431     No results found.     Assessment & Plan:   1. Acute deep vein thrombosis (DVT) of femoral vein of left lower extremity (HCC) (Primary) ***  2. Pulmonary embolism on right (HCC) ***  3. Type 2 diabetes mellitus without complication, without long-term current use of insulin (HCC) ***   Current Outpatient Medications on File Prior to Visit  Medication Sig Dispense Refill  . acetaminophen (TYLENOL) 500 MG tablet Take 2 tablets (1,000 mg total) by mouth every 8 (eight) hours as needed for mild pain, fever or headache.    . allopurinol (ZYLOPRIM) 100 MG tablet Take 1.5 tablets by mouth daily.    Marland Kitchen ammonium lactate (AMLACTIN) 12 % cream Apply 1 Application topically as needed for dry skin. 385 g 2  . apixaban (ELIQUIS) 5 MG TABS tablet Take 1 tablet (5 mg total) by mouth 2 (two) times daily. 60 tablet 5  . APIXABAN (ELIQUIS) VTE STARTER PACK (10MG  AND 5MG ) Take as directed on package: start with two-5mg  tablets twice daily for 7 days. On day 8, switch to one-5mg  tablet twice daily. 74 each 0  . DULoxetine (CYMBALTA) 20 MG capsule Take 40 mg by mouth daily.    . feeding supplement, GLUCERNA SHAKE, (GLUCERNA SHAKE) LIQD Take 237 mLs by mouth 3 (three) times daily between  meals.    . folic acid (FOLVITE) 1 MG tablet Take 1 mg by mouth daily.    Marland Kitchen gabapentin (NEURONTIN) 100 MG capsule Take 200 mg by mouth 3 (three) times daily.    Marland Kitchen ketoconazole (NIZORAL) 2 % cream Apply 1 Application topically daily. 60  g 2  . levothyroxine (SYNTHROID, LEVOTHROID) 75 MCG tablet TAKE 1 TABLET BY MOUTH ONCE DAILY ON EMPTY STOMACH WITH WATER 30-60 MIN BEFORE BREAKFAST    . Multiple Vitamin (MULTIVITAMIN WITH MINERALS) TABS tablet Take 1 tablet by mouth daily.    Marland Kitchen nystatin cream (MYCOSTATIN) Apply 1 Application topically 2 (two) times daily.     No current facility-administered medications on file prior to visit.    There are no Patient Instructions on file for this visit. No follow-ups on file.   Georgiana Spinner, NP

## 2023-06-22 ENCOUNTER — Encounter: Payer: Self-pay | Admitting: Podiatry

## 2023-09-13 ENCOUNTER — Other Ambulatory Visit (INDEPENDENT_AMBULATORY_CARE_PROVIDER_SITE_OTHER): Payer: Self-pay | Admitting: Nurse Practitioner

## 2023-09-13 DIAGNOSIS — I82412 Acute embolism and thrombosis of left femoral vein: Secondary | ICD-10-CM

## 2023-09-14 ENCOUNTER — Ambulatory Visit (INDEPENDENT_AMBULATORY_CARE_PROVIDER_SITE_OTHER): Payer: MEDICARE | Admitting: Podiatry

## 2023-09-14 ENCOUNTER — Encounter: Payer: Self-pay | Admitting: Podiatry

## 2023-09-14 DIAGNOSIS — E1149 Type 2 diabetes mellitus with other diabetic neurological complication: Secondary | ICD-10-CM | POA: Diagnosis not present

## 2023-09-14 DIAGNOSIS — M79676 Pain in unspecified toe(s): Secondary | ICD-10-CM | POA: Diagnosis not present

## 2023-09-14 DIAGNOSIS — B353 Tinea pedis: Secondary | ICD-10-CM | POA: Diagnosis not present

## 2023-09-14 DIAGNOSIS — B351 Tinea unguium: Secondary | ICD-10-CM | POA: Diagnosis not present

## 2023-09-14 NOTE — Patient Instructions (Signed)
 TO [facility]:  APPLY KETOCONAZOLE  CREAM 2% TO BOTH FEET AND BETWEEN TOES ONCE DAILY FOR SIX WEEKS.  FAMILY WILL SPRAY SHOES WITH LYSOL IN THE EVENINGS AFTER SHE RETIRES FOR THE DAY.     To prevent reinfection, spray shoes with Lysol every evening.   Athlete's Foot Athlete's foot (tinea pedis) is a fungal infection of the skin on your feet. It often occurs on the skin that is between or underneath the toes. It can also occur on the soles of your feet. The infection can spread from person to person (is contagious). It can also spread when a person's bare feet come in contact with the fungus on shower floors or on items such as shoes. What are the causes? This condition is caused by a fungus that grows in warm, moist places. You can get athlete's foot by sharing shoes, shower stalls, towels, and wet floors with someone who is infected. Not washing your feet or changing your socks often enough can also lead to athlete's foot. What increases the risk? This condition is more likely to develop in: Men. People who have a weak body defense system (immune system). People who have diabetes. People who use public showers, such as at a gym. People who wear heavy-duty shoes, such as Youth worker. Seasons with warm, humid weather. What are the signs or symptoms? Symptoms of this condition include: Itchy areas between your toes or on the soles of your feet. White, flaky, or scaly areas between your toes or on the soles of your feet. Very itchy small blisters between your toes or on the soles of your feet. Small cuts in your skin. These cuts can become infected. Thick or discolored toenails. How is this diagnosed? This condition may be diagnosed with a physical exam and a review of your medical history. Your health care provider may also take a skin or toenail sample to examine under a microscope. How is this treated? This condition is treated with antifungal medicines.  These may be applied as powders, ointments, or creams. In severe cases, an oral antifungal medicine may be given. Follow these instructions at home: Medicines Apply or take over-the-counter and prescription medicines only as told by your health care provider. Apply your antifungal medicine as told by your health care provider. Do not stop using the antifungal even if your condition improves. Foot care Do not scratch your feet. Keep your feet dry: Wear cotton or wool socks. Change your socks every day or if they become wet. Wear shoes that allow air to flow, such as sandals or canvas tennis shoes. Wash and dry your feet, including the area between your toes. Also, wash and dry your feet: Every day or as told by your health care provider. After exercising. General instructions Do not let others use towels, shoes, nail clippers, or other personal items that touch your feet. Protect your feet by wearing sandals in wet areas, such as locker rooms and shared showers. Keep all follow-up visits. This is important. If you have diabetes, keep your blood sugar under control. Contact a health care provider if: You have a fever. You have swelling, soreness, warmth, or redness in your foot. Your feet are not getting better with treatment. Your symptoms get worse. You have new symptoms. You have severe pain. Summary Athlete's foot (tinea pedis) is a fungal infection of the skin on your feet. It often occurs on skin that is between or underneath the toes. This condition is caused by  a fungus that grows in warm, moist places. Symptoms include white, flaky, or scaly areas between your toes or on the soles of your feet. This condition is treated with antifungal medicines. Keep your feet clean. Always dry them thoroughly. This information is not intended to replace advice given to you by your health care provider. Make sure you discuss any questions you have with your health care provider. Document  Revised: 08/30/2020 Document Reviewed: 08/30/2020 Elsevier Patient Education  2024 [employer].

## 2023-09-15 ENCOUNTER — Ambulatory Visit (INDEPENDENT_AMBULATORY_CARE_PROVIDER_SITE_OTHER): Payer: MEDICARE

## 2023-09-15 ENCOUNTER — Ambulatory Visit (INDEPENDENT_AMBULATORY_CARE_PROVIDER_SITE_OTHER): Payer: MEDICARE | Admitting: Nurse Practitioner

## 2023-09-15 VITALS — BP 120/73 | HR 89 | Resp 17 | Ht 61.0 in

## 2023-09-15 DIAGNOSIS — I2699 Other pulmonary embolism without acute cor pulmonale: Secondary | ICD-10-CM | POA: Diagnosis not present

## 2023-09-15 DIAGNOSIS — E119 Type 2 diabetes mellitus without complications: Secondary | ICD-10-CM

## 2023-09-15 DIAGNOSIS — I82412 Acute embolism and thrombosis of left femoral vein: Secondary | ICD-10-CM

## 2023-09-17 ENCOUNTER — Encounter (INDEPENDENT_AMBULATORY_CARE_PROVIDER_SITE_OTHER): Payer: Self-pay | Admitting: Nurse Practitioner

## 2023-09-17 NOTE — Progress Notes (Signed)
 Subjective:    Patient ID: Cheryl Rios, female    DOB: Oct 22, 1925, 89 y.o.   MRN: [id number] Chief Complaint  Patient presents with   Venous Insufficiency    The patient is a 89 year old female who presents today for follow-up evaluation after an extensive left lower extremity DVT the patient underwent thrombectomy.  She continues to have some swelling in the left leg but it is improved from where it was previously.  Currently there are no open wounds or ulcerations.  Currently she is elevating her lower extremities and continuing to work with physical therapy for activity.  In addition her family helps to walk with her as well.  Today noninvasive studies show there is continued thrombus in the left lower extremity although there is evidence of healing such as recannulization as well as a smaller clot burden    Review of Systems  Cardiovascular:  Positive for leg swelling.  Neurological:  Positive for weakness.  All other systems reviewed and are negative.      Objective:   Physical Exam Vitals reviewed.  HENT:     Head: Normocephalic.  Cardiovascular:     Rate and Rhythm: Normal rate.     Pulses: Normal pulses.  Pulmonary:     Effort: Pulmonary effort is normal.  Musculoskeletal:     Left lower leg: Edema present.  Skin:    General: Skin is warm and dry.  Neurological:     Mental Status: She is alert and oriented to person, place, and time.  Psychiatric:        Mood and Affect: Mood normal.        Behavior: Behavior normal.        Thought Content: Thought content normal.        Judgment: Judgment normal.     BP 120/73 (BP Location: Right Arm, Patient Position: Sitting, Cuff Size: Normal)   Pulse 89   Resp 17   Ht 5\' 1"  (1.549 m)   BMI 32.66 kg/m   Past Medical History:  Diagnosis Date   Arrhythmia    Arthritis    Diabetes mellitus without complication (HCC)    Diverticulosis    Dysrhythmia    Edema of both feet    HOH (hard of hearing)     Hypertension    Hypothyroidism    Neuropathy    Osteoporosis    Sleep apnea    OSA--Use C-PAP   Thyroid disease     Social History   Socioeconomic History   Marital status: Widowed    Spouse name: Not on file   Number of children: Not on file   Years of education: Not on file   Highest education level: Not on file  Occupational History   Not on file  Tobacco Use   Smoking status: Never   Smokeless tobacco: Never  Vaping Use   Vaping status: Never Used  Substance and Sexual Activity   Alcohol use: No   Drug use: No   Sexual activity: Never  Other Topics Concern   Not on file  Social History Narrative   Not on file   Social Drivers of Health   Financial Resource Strain: Low Risk  (01/19/2023)   Received from [facility]   Overall Financial Resource Strain (CARDIA)    Difficulty of Paying Living Expenses: Not hard at all  Food Insecurity: No Food Insecurity (04/26/2023)   Hunger Vital Sign    Worried About Running Out of Food in the  Last Year: Never true    Ran Out of Food in the Last Year: Never true  Transportation Needs: No Transportation Needs (04/26/2023)   PRAPARE - Administrator, Civil Service (Medical): No    Lack of Transportation (Non-Medical): No  Physical Activity: Not on file  Stress: Not on file  Social Connections: Not on file  Intimate Partner Violence: Not At Risk (04/26/2023)   Humiliation, Afraid, Rape, and Kick questionnaire    Fear of Current or Ex-Partner: No    Emotionally Abused: No    Physically Abused: No    Sexually Abused: No    Past Surgical History:  Procedure Laterality Date   ABDOMINAL HYSTERECTOMY     CATARACT EXTRACTION W/PHACO Right 07/19/2016   Procedure: CATARACT EXTRACTION PHACO AND INTRAOCULAR LENS PLACEMENT (IOC);  Surgeon: Annell Kidney, MD;  Location: [facility] ORS;  Service: Ophthalmology;  Laterality: Right;  US  0:55.8 AP% 14.3 CDE 9.52 Fluid pack lot # [id number]   CATARACT EXTRACTION  W/PHACO Left 08/18/2016   Procedure: CATARACT EXTRACTION PHACO AND INTRAOCULAR LENS PLACEMENT (IOC);  Surgeon: Annell Kidney, MD;  Location: [facility] ORS;  Service: Ophthalmology;  Laterality: Left;  US1:03.1 AP%20.6 CDE13.2 Fluid pack lot # [id number]   CHOLECYSTECTOMY     ERCP N/A 02/07/2017   Procedure: ENDOSCOPIC RETROGRADE CHOLANGIOPANCREATOGRAPHY (ERCP);  Surgeon: Marnee Sink, MD;  Location: [facility] ENDOSCOPY;  Service: Endoscopy;  Laterality: N/A;   FEMUR IM NAIL Left 02/11/2023   Procedure: INTRAMEDULLARY (IM) NAIL FEMORAL;  Surgeon: Zafonte, Brian Thomas, MD;  Location: [facility] ORS;  Service: Orthopedics;  Laterality: Left;   GALLBLADDER SURGERY     HEMORRHOID SURGERY     JOINT REPLACEMENT Bilateral    KNEE REPLACEMENT   KNEE SURGERY Right    PERIPHERAL VASCULAR THROMBECTOMY Left 04/28/2023   Procedure: PERIPHERAL VASCULAR THROMBECTOMY;  Surgeon: Jackquelyn Mass, MD;  Location: [facility] INVASIVE CV LAB;  Service: Cardiovascular;  Laterality: Left;    Family History  Problem Relation Age of Onset   Breast cancer Neg Hx     No Known Allergies     Latest Ref Rng & Units 04/29/2023    2:39 AM 04/28/2023    4:45 AM 04/27/2023    4:42 AM  CBC  WBC 4.0 - 10.5 K/uL 6.3  6.9  6.9   Hemoglobin 12.0 - 15.0 g/dL 9.5  62.1  30.8   Hematocrit 36.0 - 46.0 % 29.1  33.4  32.0   Platelets 150 - 400 K/uL 186  191  179       CMP     Component Value Date/Time   NA 134 (L) 04/30/2023 0338   NA 142 07/06/2012 0431   K 3.9 04/30/2023 0338   K 4.5 07/06/2012 0431   CL 101 04/30/2023 0338   CL 111 (H) 07/06/2012 0431   CO2 26 04/30/2023 0338   CO2 27 07/06/2012 0431   GLUCOSE 125 (H) 04/30/2023 0338   GLUCOSE 102 (H) 07/06/2012 0431   BUN 16 04/30/2023 0338   BUN 17 07/06/2012 0431   CREATININE 0.83 04/30/2023 0338   CREATININE 0.90 07/06/2012 0431   CALCIUM 9.2 04/30/2023 0338   CALCIUM 8.7 07/06/2012 0431   PROT 7.5 04/25/2023 1738   ALBUMIN 3.7 04/25/2023 1738   AST 31 04/25/2023  1738   ALT 25 04/25/2023 1738   ALKPHOS 108 04/25/2023 1738   BILITOT 0.6 04/25/2023 1738   GFRNONAA >60 04/30/2023 0338   GFRNONAA 58 (L) 07/06/2012 0431     No results  found.     Assessment & Plan:   1. Acute deep vein thrombosis (DVT) of femoral vein of left lower extremity (HCC) (Primary) Recommend:   The patient is post successful thrombectomy IVC filter is not indicated at present.  Patient's duplex ultrasound of the venous system shows DVT in the left lower extremity.  The patient is on anticoagulation   Elevation was stressed, such as the use of a recliner.  I have reviewed with the patient DVT and post phlebitic changes such as swelling and why it  causes symptoms such as pain.  I recommended to the patient to wear graduated compression stockings, beginning after three full days of anticoagulation.  Graduated compression should be worn on a daily basis. The patient should wear compression beginning first thing in the morning and removing them in the evening. The patient is instructed specifically not to sleep in the stockings.  In addition, behavioral modification including elevation during the day and avoidance of prolonged dependency will be initiated.    The patient will continue anticoagulation for now as there have not been any problems or complications from anticoagulation therapy at this point.    2. Pulmonary embolism on right [facility]) Given the pulmonary embolism patient is recommended to be on Eliquis  for 1 year  3. Type 2 diabetes mellitus without complication, without long-term current use of insulin  (HCC) Continue hypoglycemic medications as already ordered, these medications have been reviewed and there are no changes at this time.  Hgb A1C to be monitored as already arranged by primary service   Current Outpatient Medications on File Prior to Visit  Medication Sig Dispense Refill   acetaminophen  (TYLENOL ) 500 MG tablet Take 2 tablets (1,000 mg total) by  mouth every 8 (eight) hours as needed for mild pain, fever or headache.     allopurinol  (ZYLOPRIM ) 100 MG tablet Take 1.5 tablets by mouth daily.     ammonium lactate  (AMLACTIN) 12 % cream Apply 1 Application topically as needed for dry skin. 385 g 2   apixaban  (ELIQUIS ) 5 MG TABS tablet Take 1 tablet (5 mg total) by mouth 2 (two) times daily. 60 tablet 5   APIXABAN  (ELIQUIS ) VTE STARTER PACK (10MG  AND 5MG ) Take as directed on package: start with two-5mg  tablets twice daily for 7 days. On day 8, switch to one-5mg  tablet twice daily. 74 each 0   DULoxetine  (CYMBALTA ) 20 MG capsule Take 40 mg by mouth daily.     feeding supplement, GLUCERNA SHAKE, (GLUCERNA SHAKE) LIQD Take 237 mLs by mouth 3 (three) times daily between meals.     folic acid  (FOLVITE ) 1 MG tablet Take 1 mg by mouth daily.     gabapentin  (NEURONTIN ) 100 MG capsule Take 200 mg by mouth 3 (three) times daily.     ketoconazole  (NIZORAL ) 2 % cream Apply 1 Application topically daily. 60 g 2   levothyroxine  (SYNTHROID , LEVOTHROID) 75 MCG tablet TAKE 1 TABLET BY MOUTH ONCE DAILY ON EMPTY STOMACH WITH WATER 30-60 MIN BEFORE BREAKFAST     Multiple Vitamin (MULTIVITAMIN WITH MINERALS) TABS tablet Take 1 tablet by mouth daily.     nystatin cream (MYCOSTATIN) Apply 1 Application topically 2 (two) times daily.     No current facility-administered medications on file prior to visit.    There are no Patient Instructions on file for this visit. No follow-ups on file.   Gari Hartsell E Jozlyn Schatz, NP

## 2023-09-19 NOTE — Progress Notes (Signed)
  Subjective:  Patient ID: Cheryl Rios, female    DOB: Nov 05, 1925,  MRN: [id number]  Cheryl Rios presents to clinic today for at risk foot care with history of diabetic neuropathy and painful elongated mycotic toenails 1-5 bilaterally which are tender when wearing enclosed shoe gear. Pain is relieved with periodic professional debridement. She is accompanied by family member on today's visit. Patient now resides at [facility] of [address] Chief Complaint  Patient presents with   Diabetes    "Do my toenails and my calluses."   New problem(s): None.   PCP is Lyle San, MD.  No Known Allergies  Review of Systems: Negative except as noted in the HPI.  Objective:  There were no vitals filed for this visit. Cheryl Rios is a pleasant 89 y.o. female WD, WN in NAD. AAO x 3.  Vascular Examination: Capillary refill time immediate b/l. Vascular status intact b/l with palpable pedal pulses. Pedal hair absent b/l. No pain with calf compression b/l. Skin temperature gradient WNL b/l. No cyanosis or clubbing b/l. No ischemia or gangrene noted b/l. Dependent edema noted b/l LE.  Neurological Examination: Sensation grossly intact b/l with 10 gram monofilament. Vibratory sensation intact b/l. Pt has subjective symptoms of neuropathy.  Dermatological Examination: Pedal skin with normal turgor, texture and tone b/l.  No open wounds. No interdigital macerations.   Toenails 1-5 b/l thick, discolored, elongated with subungual debris and pain on dorsal palpation.   Diffuse scaling noted peripherally and plantarly b/l feet.  No interdigital macerations.  No blisters, no weeping. No signs of secondary bacterial infection noted.  Musculoskeletal Examination: Muscle strength 5/5 to all lower extremity muscle groups bilaterally. No pain, crepitus or joint limitation noted with ROM bilateral LE. 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 There is blanchable erythema of left 5th digit. No  pain on palpation. Plantarflexed metatarsal(s) 5th metatarsal head b/l lower extremities. HAV with bunion deformity noted b/l LE. Utilizes transport chair for mobility assistance.  Assessment/Plan: 1. Pain due to onychomycosis of toenail   2. Tinea pedis of both feet   3. Type II diabetes mellitus with neurological manifestations [facility])    -Patient was evaluated today. All questions/concerns addressed on today's visit. -Patient's family to spray shoes with Lysol on the evenings of their visit. -Facility to continue fall precautions and pressure precautions. -Order written for facility to apply Ketoconazole  cream 2% to both feet and between toes once daily for six weeks. -Mycotic toenails 1-5 bilaterally were debrided in length and girth with sterile nail nippers and dremel without incident. -Patient/POA to call should there be question/concern in the interim.   Return in about 3 months (around 12/14/2023).  Luella Sager, DPM      [place] LOCATION: [address] [place] [address]                   Office [phone]   [facility] LOCATION: [address] [place], [place] [address] Office [phone]

## 2023-10-10 ENCOUNTER — Encounter (INDEPENDENT_AMBULATORY_CARE_PROVIDER_SITE_OTHER): Payer: Self-pay

## 2023-12-14 ENCOUNTER — Encounter: Payer: Self-pay | Admitting: Podiatry

## 2023-12-14 ENCOUNTER — Ambulatory Visit (INDEPENDENT_AMBULATORY_CARE_PROVIDER_SITE_OTHER): Payer: MEDICARE | Admitting: Podiatry

## 2023-12-14 DIAGNOSIS — M79676 Pain in unspecified toe(s): Secondary | ICD-10-CM | POA: Diagnosis not present

## 2023-12-14 DIAGNOSIS — B351 Tinea unguium: Secondary | ICD-10-CM | POA: Diagnosis not present

## 2023-12-14 DIAGNOSIS — E1149 Type 2 diabetes mellitus with other diabetic neurological complication: Secondary | ICD-10-CM

## 2023-12-14 DIAGNOSIS — M2041 Other hammer toe(s) (acquired), right foot: Secondary | ICD-10-CM

## 2023-12-14 DIAGNOSIS — R600 Localized edema: Secondary | ICD-10-CM | POA: Diagnosis not present

## 2023-12-14 DIAGNOSIS — M2042 Other hammer toe(s) (acquired), left foot: Secondary | ICD-10-CM

## 2023-12-14 DIAGNOSIS — E119 Type 2 diabetes mellitus without complications: Secondary | ICD-10-CM | POA: Diagnosis not present

## 2023-12-14 NOTE — Progress Notes (Signed)
 ANNUAL DIABETIC FOOT EXAM  Subjective: Cheryl Rios presents today for annual diabetic foot exam. She is a resident of [address] She is accompanied by her brother on today's visit. Chief Complaint  Patient presents with   Nail Problem    Thick painful toenails, 3 month follow up    Patient confirms h/o diabetes.  Patient denies any h/o foot wounds  Patient has been diagnosed with neuropathy.  Valora Agent, MD is patient's PCP.  Past Medical History:  Diagnosis Date   Arrhythmia    Arthritis    Diabetes mellitus without complication (HCC)    Diverticulosis    Dysrhythmia    Edema of both feet    HOH (hard of hearing)    Hypertension    Hypothyroidism    Neuropathy    Osteoporosis    Sleep apnea    OSA--Use C-PAP   Thyroid disease    Patient Active Problem List   Diagnosis Date Noted   Acute deep vein thrombosis (DVT) of femoral vein of left lower extremity (HCC) 04/26/2023   Pressure injury of skin 04/26/2023   Acute deep vein thrombosis (DVT) of both lower extremities (HCC) 04/25/2023   Pulmonary embolism on right (HCC) 04/25/2023   CKD stage 3a, GFR 45-59 ml/min (HCC) 04/25/2023   Dysphagia, oropharyngeal phase 02/16/2023   Hypothyroidism, unspecified 02/15/2023   Rheumatoid arthritis, unspecified (HCC) 02/15/2023   Closed left hip fracture, initial encounter (HCC) 02/10/2023   Hip fracture (HCC) 02/10/2023   Chronic kidney disease, unspecified 01/03/2023   Idiopathic gout, unspecified site 01/03/2023   Non-pressure chronic ulcer of unspecified part of left lower leg limited to breakdown of skin (HCC) 09/27/2022   Chronic gouty arthritis 10/28/2021   OSA on CPAP 02/01/2021   CPAP use counseling 02/01/2021   Hypertension 02/01/2021   Obesity (BMI 30-39.9) 02/01/2021   Callus 07/13/2020   Pedal edema 11/08/2017   Choledocholithiasis    Sepsis (HCC) 02/05/2017   Fever 02/04/2017   Weakness 02/04/2017   Failure to thrive in adult 02/04/2017    OA (osteoarthritis) 02/04/2017   Status post total left knee replacement 09/19/2016   Hyperlipidemia, unspecified 08/09/2014   Type 2 diabetes mellitus without complications (HCC) 08/09/2014   Osteoporosis 10/04/2013   Elevated LFTs 01/23/2011   Past Surgical History:  Procedure Laterality Date   ABDOMINAL HYSTERECTOMY     CATARACT EXTRACTION W/PHACO Right 07/19/2016   Procedure: CATARACT EXTRACTION PHACO AND INTRAOCULAR LENS PLACEMENT (IOC);  Surgeon: Dene Etienne, MD;  Location: [facility] ORS;  Service: Ophthalmology;  Laterality: Right;  US  0:55.8 AP% 14.3 CDE 9.52 Fluid pack lot # [id number]   CATARACT EXTRACTION W/PHACO Left 08/18/2016   Procedure: CATARACT EXTRACTION PHACO AND INTRAOCULAR LENS PLACEMENT (IOC);  Surgeon: Dene Etienne, MD;  Location: [facility] ORS;  Service: Ophthalmology;  Laterality: Left;  US1:03.1 AP%20.6 CDE13.2 Fluid pack lot # [id number]   CHOLECYSTECTOMY     ERCP N/A 02/07/2017   Procedure: ENDOSCOPIC RETROGRADE CHOLANGIOPANCREATOGRAPHY (ERCP);  Surgeon: Jinny Carmine, MD;  Location: [facility] ENDOSCOPY;  Service: Endoscopy;  Laterality: N/A;   FEMUR IM NAIL Left 02/11/2023   Procedure: INTRAMEDULLARY (IM) NAIL FEMORAL;  Surgeon: Zafonte, Brian Thomas, MD;  Location: [facility] ORS;  Service: Orthopedics;  Laterality: Left;   GALLBLADDER SURGERY     HEMORRHOID SURGERY     JOINT REPLACEMENT Bilateral    KNEE REPLACEMENT   KNEE SURGERY Right    PERIPHERAL VASCULAR THROMBECTOMY Left 04/28/2023   Procedure: PERIPHERAL VASCULAR THROMBECTOMY;  Surgeon: Jama Cordella MATSU,  MD;  Location: [facility] INVASIVE CV LAB;  Service: Cardiovascular;  Laterality: Left;   Current Outpatient Medications on File Prior to Visit  Medication Sig Dispense Refill   acetaminophen  (TYLENOL ) 500 MG tablet Take 2 tablets (1,000 mg total) by mouth every 8 (eight) hours as needed for mild pain, fever or headache.     allopurinol  (ZYLOPRIM ) 100 MG tablet Take 1.5 tablets by mouth daily.     ammonium  lactate (AMLACTIN) 12 % cream Apply 1 Application topically as needed for dry skin. 385 g 2   apixaban  (ELIQUIS ) 5 MG TABS tablet Take 1 tablet (5 mg total) by mouth 2 (two) times daily. 60 tablet 5   APIXABAN  (ELIQUIS ) VTE STARTER PACK (10MG  AND 5MG ) Take as directed on package: start with two-5mg  tablets twice daily for 7 days. On day 8, switch to one-5mg  tablet twice daily. 74 each 0   DULoxetine  (CYMBALTA ) 20 MG capsule Take 40 mg by mouth daily.     feeding supplement, GLUCERNA SHAKE, (GLUCERNA SHAKE) LIQD Take 237 mLs by mouth 3 (three) times daily between meals.     folic acid  (FOLVITE ) 1 MG tablet Take 1 mg by mouth daily.     gabapentin  (NEURONTIN ) 100 MG capsule Take 200 mg by mouth 3 (three) times daily.     ketoconazole  (NIZORAL ) 2 % cream Apply 1 Application topically daily. 60 g 2   levothyroxine  (SYNTHROID , LEVOTHROID) 75 MCG tablet TAKE 1 TABLET BY MOUTH ONCE DAILY ON EMPTY STOMACH WITH WATER 30-60 MIN BEFORE BREAKFAST     Multiple Vitamin (MULTIVITAMIN WITH MINERALS) TABS tablet Take 1 tablet by mouth daily.     nystatin cream (MYCOSTATIN) Apply 1 Application topically 2 (two) times daily.     No current facility-administered medications on file prior to visit.    No Known Allergies Social History   Occupational History   Not on file  Tobacco Use   Smoking status: Never   Smokeless tobacco: Never  Vaping Use   Vaping status: Never Used  Substance and Sexual Activity   Alcohol use: No   Drug use: No   Sexual activity: Never   Family History  Problem Relation Age of Onset   Breast cancer Neg Hx    Immunization History  Administered Date(s) Administered   Fluad Quad(high Dose 65+) 02/04/2019   Influenza, High Dose Seasonal PF 02/06/2017, 02/21/2018   Influenza-Unspecified 03/21/2012, 03/13/2013, 03/14/2014, 03/13/2015, 01/26/2016   Moderna Covid-19 Vaccine Bivalent Booster 68yrs & up 03/05/2021   Moderna Sars-Covid-2 Vaccination 06/15/2019, 07/13/2019, 03/17/2020,  08/31/2020   Pneumococcal Conjugate-13 12/18/2017, 01/05/2019   Pneumococcal Polysaccharide-23 01/04/2018, 01/04/2019   Zoster Recombinant(Shingrix) 12/18/2017, 05/15/2018     Review of Systems: Negative except as noted in the HPI.   Objective: There were no vitals filed for this visit.  Cheryl Rios is a pleasant 89 y.o. female in NAD. AAO X 3.  Diabetic foot exam was performed with the following findings:   Vascular Examination: CFT <3 seconds b/l. DP/PT pulses faintly palpable b/l. Skin temperature gradient warm to warm b/l. No pain with calf compression. No ischemia or gangrene. No cyanosis or clubbing noted b/l. Trace edema noted right lower extremity. +1 pitting edema left lower extremity.   Neurological Examination: Protective sensation decreased with 10 gram monofilament b/l. Vibratory sensation intact b/l.  Dermatological Examination: Pedal skin warm and supple b/l.   No open wounds. No pressure injuries. No interdigital macerations.  Toenails 1-5 b/l thick, discolored, elongated with subungual debris and pain on  dorsal palpation.    No hyperkeratotic nor porokeratotic lesions present on today's visit.  Musculoskeletal Examination: Muscle strength 4/5 to all lower extremity muscle groups bilaterally. Hammertoe(s) 2-5 b/l. Utilizes wheelchair for mobility assistance.  Radiographs: None     Lab Results  Component Value Date   HGBA1C 6.2 (H) 02/10/2023   ADA Risk Categorization: High Risk  Patient has one or more of the following: Loss of protective sensation Absent pedal pulses Severe Foot deformity History of foot ulcer  Assessment: 1. Pain due to onychomycosis of toenail   2. Pedal edema   3. Acquired hammertoes of both feet   4. Type II diabetes mellitus with neurological manifestations (HCC)   5. Encounter for diabetic foot exam [facility])     Plan: -Patient was evaluated today. All questions/concerns addressed on today's visit. -Patient's family  member present. All questions/concerns addressed on today's visit. -No new findings. No new orders. -Facility to continue fall precautions and pressure precautions. -Diabetic foot examination performed today. -Continue diabetic foot care principles: inspect feet daily, monitor glucose as recommended by PCP and/or Endocrinologist, and follow prescribed diet per PCP, Endocrinologist and/or dietician. -Patient to continue soft, supportive shoe gear daily. -Toenails 1-5 b/l were debrided in length and girth with sterile nail nippers and dremel without iatrogenic bleeding.  -Patient/POA to call should there be question/concern in the interim. Return in about 3 months (around 03/15/2024).  Delon LITTIE Merlin, DPM      [place] LOCATION: [address], [place] [address]                   Office [phone]   [facility] LOCATION: [address] [place], [place] [address] Office [phone]

## 2024-02-03 NOTE — Progress Notes (Unsigned)
 [facility] [address] [place], [place] [address]  Pulmonary Sleep Medicine   Office Visit Note  Patient Name: Cheryl Rios DOB: 03/27/1926 MRN [id number]    Chief Complaint: Obstructive Sleep Apnea visit  Brief History:  Marcene is seen today for an annual follow up on CPAP @ 8 cmH2O. The patient has a 15 history of sleep apnea. Patient is using PAP nightly.  The patient feels rested after sleeping with PAP.  The patient reports benefiting from PAP use. Reported sleepiness is  improved and the Epworth Sleepiness Score is 16 out of 24. The patient does take naps daily. The patient complains of the following: none.  The compliance download shows 78% compliance with an average use time of 5 hours 22 minutes. The AHI is 21.2  The patient does not complain of limb movements disrupting sleep. In the last year, patient has had blood clots and a hip fracture with hip replacement. She is now living at the [facility], an assisted living facility.   ROS  General: (-) fever, (-) chills, (-) night sweat Nose and Sinuses: (-) nasal stuffiness or itchiness, (-) postnasal drip, (-) nosebleeds, (-) sinus trouble. Mouth and Throat: (-) sore throat, (-) hoarseness. Neck: (-) swollen glands, (-) enlarged thyroid, (-) neck pain. Respiratory: - cough, - shortness of breath, - wheezing. Neurologic: - numbness, - tingling. Psychiatric: - anxiety, - depression   Current Medication: Outpatient Encounter Medications as of 02/05/2024  Medication Sig   acetaminophen  (TYLENOL ) 500 MG tablet Take 2 tablets (1,000 mg total) by mouth every 8 (eight) hours as needed for mild pain, fever or headache.   allopurinol  (ZYLOPRIM ) 100 MG tablet Take 1.5 tablets by mouth daily.   ammonium lactate  (AMLACTIN) 12 % cream Apply 1 Application topically as needed for dry skin.   apixaban  (ELIQUIS ) 5 MG TABS tablet Take 1 tablet (5 mg total) by mouth 2 (two) times daily.   APIXABAN  (ELIQUIS ) VTE STARTER PACK (10MG   AND 5MG ) Take as directed on package: start with two-5mg  tablets twice daily for 7 days. On day 8, switch to one-5mg  tablet twice daily.   DULoxetine  (CYMBALTA ) 20 MG capsule Take 40 mg by mouth daily.   feeding supplement, GLUCERNA SHAKE, (GLUCERNA SHAKE) LIQD Take 237 mLs by mouth 3 (three) times daily between meals.   folic acid  (FOLVITE ) 1 MG tablet Take 1 mg by mouth daily.   gabapentin  (NEURONTIN ) 100 MG capsule Take 200 mg by mouth 3 (three) times daily.   ketoconazole  (NIZORAL ) 2 % cream Apply 1 Application topically daily.   levothyroxine  (SYNTHROID , LEVOTHROID) 75 MCG tablet TAKE 1 TABLET BY MOUTH ONCE DAILY ON EMPTY STOMACH WITH WATER 30-60 MIN BEFORE BREAKFAST   Multiple Vitamin (MULTIVITAMIN WITH MINERALS) TABS tablet Take 1 tablet by mouth daily.   nystatin cream (MYCOSTATIN) Apply 1 Application topically 2 (two) times daily.   No facility-administered encounter medications on file as of 02/05/2024.    Surgical History: Past Surgical History:  Procedure Laterality Date   ABDOMINAL HYSTERECTOMY     CATARACT EXTRACTION W/PHACO Right 07/19/2016   Procedure: CATARACT EXTRACTION PHACO AND INTRAOCULAR LENS PLACEMENT (IOC);  Surgeon: Dene Etienne, MD;  Location: [facility] ORS;  Service: Ophthalmology;  Laterality: Right;  US  0:55.8 AP% 14.3 CDE 9.52 Fluid pack lot # [id number]   CATARACT EXTRACTION W/PHACO Left 08/18/2016   Procedure: CATARACT EXTRACTION PHACO AND INTRAOCULAR LENS PLACEMENT (IOC);  Surgeon: Dene Etienne, MD;  Location: [facility] ORS;  Service: Ophthalmology;  Laterality: Left;  US1:03.1 AP%20.6  RIZ86.7 Fluid pack lot # [id number]   CHOLECYSTECTOMY     ERCP N/A 02/07/2017   Procedure: ENDOSCOPIC RETROGRADE CHOLANGIOPANCREATOGRAPHY (ERCP);  Surgeon: Jinny Carmine, MD;  Location: [facility] ENDOSCOPY;  Service: Endoscopy;  Laterality: N/A;   FEMUR IM NAIL Left 02/11/2023   Procedure: INTRAMEDULLARY (IM) NAIL FEMORAL;  Surgeon: Zafonte, Brian Thomas, MD;  Location: [facility] ORS;   Service: Orthopedics;  Laterality: Left;   GALLBLADDER SURGERY     HEMORRHOID SURGERY     JOINT REPLACEMENT Bilateral    KNEE REPLACEMENT   KNEE SURGERY Right    PERIPHERAL VASCULAR THROMBECTOMY Left 04/28/2023   Procedure: PERIPHERAL VASCULAR THROMBECTOMY;  Surgeon: Jama Cordella MATSU, MD;  Location: [facility] INVASIVE CV LAB;  Service: Cardiovascular;  Laterality: Left;    Medical History: Past Medical History:  Diagnosis Date   Arrhythmia    Arthritis    Diabetes mellitus without complication (HCC)    Diverticulosis    Dysrhythmia    Edema of both feet    HOH (hard of hearing)    Hypertension    Hypothyroidism    Neuropathy    Osteoporosis    Sleep apnea    OSA--Use C-PAP   Thyroid disease     Family History: Non contributory to the present illness  Social History: Social History   Socioeconomic History   Marital status: Widowed    Spouse name: Not on file   Number of children: Not on file   Years of education: Not on file   Highest education level: Not on file  Occupational History   Not on file  Tobacco Use   Smoking status: Never   Smokeless tobacco: Never  Vaping Use   Vaping status: Never Used  Substance and Sexual Activity   Alcohol use: No   Drug use: No   Sexual activity: Never  Other Topics Concern   Not on file  Social History Narrative   Not on file   Social Drivers of Health   Financial Resource Strain: Low Risk  (01/19/2023)   Received from [facility]   Overall Financial Resource Strain (CARDIA)    Difficulty of Paying Living Expenses: Not hard at all  Food Insecurity: No Food Insecurity (04/26/2023)   Hunger Vital Sign    Worried About Running Out of Food in the Last Year: Never true    Ran Out of Food in the Last Year: Never true  Transportation Needs: No Transportation Needs (04/26/2023)   PRAPARE - Administrator, Civil Service (Medical): No    Lack of Transportation (Non-Medical): No  Physical Activity:  Not on file  Stress: Not on file  Social Connections: Not on file  Intimate Partner Violence: Not At Risk (04/26/2023)   Humiliation, Afraid, Rape, and Kick questionnaire    Fear of Current or Ex-Partner: No    Emotionally Abused: No    Physically Abused: No    Sexually Abused: No    Vital Signs: There were no vitals taken for this visit. There is no height or weight on file to calculate BMI.    Examination: General Appearance: The patient is well-developed, well-nourished, and in no distress. Neck Circumference: 46 cm Skin: Gross inspection of skin unremarkable. Head: normocephalic, no gross deformities. Eyes: no gross deformities noted. ENT: ears appear grossly normal Neurologic: Alert and oriented. No involuntary movements.  STOP BANG RISK ASSESSMENT S (snore) Have you been told that you snore?     NO   T (tired) Are you often  tired, fatigued, or sleepy during the day?   NO  O (obstruction) Do you stop breathing, choke, or gasp during sleep? NO   P (pressure) Do you have or are you being treated for high blood pressure? YES   B (BMI) Is your body index greater than 35 kg/m? NO   A (age) Are you 47 years old or older? YES   N (neck) Do you have a neck circumference greater than 16 inches?   YES   G (gender) Are you a female? NO   TOTAL STOP/BANG "YES" ANSWERS        A STOP-Bang score of 2 or less is considered low risk, and a score of 5 or more is high risk for having either moderate or severe OSA. For people who score 3 or 4, doctors may need to perform further assessment to determine how likely they are to have OSA.         EPWORTH SLEEPINESS SCALE:  Scale:  (0)= no chance of dozing; (1)= slight chance of dozing; (2)= moderate chance of dozing; (3)= high chance of dozing  Chance  Situtation    Sitting and reading: 3    Watching TV: 3    Sitting Inactive in public: 2    As a passenger in car: 1      Lying down to rest: 3    Sitting and talking: 2     Sitting quielty after lunch: 2    In a car, stopped in traffic: 0   TOTAL SCORE:   16 out of 24    SLEEP STUDIES:  Split (08/2019) AHI 81.9/Hr, min SPO2 66%, CPAP@ 8 cmH2O   CPAP COMPLIANCE DATA:  Date Range: 02/02/2023 - 02/01/2024  Average Daily Use: 5 hours 57 minutes  Median Use: 6 hours 9 minutes  Compliance for > 4 Hours: 78% days  AHI: 21.2  respiratory events per hour  Days Used: 329/365  Mask Leak: 62.9  95th Percentile Pressure: 8 cmH2O         LABS: No results found for this or any previous visit (from the past 2160 hours).  Radiology: ECHOCARDIOGRAM COMPLETE Result Date: 04/28/2023    ECHOCARDIOGRAM REPORT   Patient Name:   Cheryl Rios Date of Exam: 04/26/2023 Medical Rec #:  [id number]        Height:       61.0 in Accession #:    [id number]       Weight:       172.8 lb Date of Birth:  07-10-1925        BSA:          1.775 m Patient Age:    89 years         BP:           97/48 mmHg Patient Gender: F                HR:           92 bpm. Exam Location:  [facility] Procedure: 2D Echo, Cardiac Doppler and Color Doppler Indications:     Pulmonary embolus  History:         Patient has prior history of Echocardiogram examinations, most                  recent 02/12/2023. Risk Factors:Hypertension, Diabetes,                  Dyslipidemia and Sleep Apnea. CKD, Technically difficult study.  Patient is in a sitting position.  Sonographer:     Naomie Reef Referring Phys:  [id number] [place]NE BROOM Diagnosing Phys: Annalee Casa  Sonographer Comments: Technically difficult study due to poor echo windows and suboptimal apical window. Image acquisition challenging due to respiratory motion. IMPRESSIONS  1. Left ventricular ejection fraction, by estimation, is 60 to 65%. The left ventricle has normal function. The left ventricle has no regional wall motion abnormalities. Left ventricular diastolic parameters are consistent with Grade I diastolic dysfunction  (impaired relaxation).  2. +McConnells sign consistent with PE. Right ventricular systolic function is mildly reduced. The right ventricular size is mildly enlarged. There is normal pulmonary artery systolic pressure. The estimated right ventricular systolic pressure is 24.5 mmHg.  3. The mitral valve is grossly normal. No evidence of mitral valve regurgitation. No evidence of mitral stenosis.  4. The aortic valve was not well visualized. Aortic valve regurgitation is not visualized. No aortic stenosis is present. FINDINGS  Left Ventricle: Left ventricular ejection fraction, by estimation, is 60 to 65%. The left ventricle has normal function. The left ventricle has no regional wall motion abnormalities. The left ventricular internal cavity size was normal in size. There is  no left ventricular hypertrophy. Left ventricular diastolic parameters are consistent with Grade I diastolic dysfunction (impaired relaxation). Right Ventricle: +McConnells sign consistent with PE. The right ventricular size is mildly enlarged. No increase in right ventricular wall thickness. Right ventricular systolic function is mildly reduced. There is normal pulmonary artery systolic pressure. The tricuspid regurgitant velocity is 2.32 m/s, and with an assumed right atrial pressure of 3 mmHg, the estimated right ventricular systolic pressure is 24.5 mmHg. Left Atrium: Left atrial size was normal in size. Right Atrium: Right atrial size was normal in size. Pericardium: There is no evidence of pericardial effusion. Mitral Valve: The mitral valve is grossly normal. No evidence of mitral valve regurgitation. No evidence of mitral valve stenosis. MV peak gradient, 4.2 mmHg. The mean mitral valve gradient is 2.0 mmHg. Tricuspid Valve: The tricuspid valve is not well visualized. Tricuspid valve regurgitation is trivial. Aortic Valve: The aortic valve was not well visualized. Aortic valve regurgitation is not visualized. No aortic stenosis is  present. Aortic valve mean gradient measures 4.0 mmHg. Aortic valve peak gradient measures 8.2 mmHg. Aortic valve area, by VTI measures 2.26 cm. Pulmonic Valve: The pulmonic valve was not well visualized. Pulmonic valve regurgitation is not visualized. Aorta: The aortic root is normal in size and structure. IAS/Shunts: No atrial level shunt detected by color flow Doppler.  LEFT VENTRICLE PLAX 2D LVIDd:         4.60 cm   Diastology LVIDs:         2.90 cm   LV e' medial:    6.31 cm/s LV PW:         1.10 cm   LV E/e' medial:  12.4 LV IVS:        1.10 cm   LV e' lateral:   8.27 cm/s LVOT diam:     2.00 cm   LV E/e' lateral: 9.4 LV SV:         59 LV SV Index:   33 LVOT Area:     3.14 cm  RIGHT VENTRICLE RV Basal diam:  2.75 cm RV Mid diam:    2.80 cm LEFT ATRIUM             Index        RIGHT ATRIUM  Index LA diam:        3.30 cm 1.86 cm/m   RA Area:     13.80 cm LA Vol (A2C):   42.1 ml 23.72 ml/m  RA Volume:   30.50 ml  17.18 ml/m LA Vol (A4C):   18.5 ml 10.42 ml/m LA Biplane Vol: 28.3 ml 15.94 ml/m  AORTIC VALVE                    PULMONIC VALVE AV Area (Vmax):    2.44 cm     PV Vmax:       0.88 m/s AV Area (Vmean):   2.21 cm     PV Peak grad:  3.1 mmHg AV Area (VTI):     2.26 cm AV Vmax:           143.00 cm/s AV Vmean:          91.600 cm/s AV VTI:            0.260 m AV Peak Grad:      8.2 mmHg AV Mean Grad:      4.0 mmHg LVOT Vmax:         111.00 cm/s LVOT Vmean:        64.400 cm/s LVOT VTI:          0.187 m LVOT/AV VTI ratio: 0.72  AORTA Ao Root diam: 3.50 cm MITRAL VALVE                TRICUSPID VALVE MV Area (PHT): 4.44 cm     TR Peak grad:   21.5 mmHg MV Area VTI:   2.61 cm     TR Vmax:        232.00 cm/s MV Peak grad:  4.2 mmHg MV Mean grad:  2.0 mmHg     SHUNTS MV Vmax:       1.03 m/s     Systemic VTI:  0.19 m MV Vmean:      57.9 cm/s    Systemic Diam: 2.00 cm MV Decel Time: 171 msec MV E velocity: 78.10 cm/s MV A velocity: 108.00 cm/s MV E/A ratio:  0.72 Designer, multimedia  signed by Annalee Casa Signature Date/Time: 04/28/2023/12:57:48 PM    Final     No results found.  No results found.    Assessment and Plan: Patient Active Problem List   Diagnosis Date Noted   Acute deep vein thrombosis (DVT) of femoral vein of left lower extremity (HCC) 04/26/2023   Pressure injury of skin 04/26/2023   Acute deep vein thrombosis (DVT) of both lower extremities (HCC) 04/25/2023   Pulmonary embolism on right (HCC) 04/25/2023   CKD stage 3a, GFR 45-59 ml/min (HCC) 04/25/2023   Dysphagia, oropharyngeal phase 02/16/2023   Hypothyroidism, unspecified 02/15/2023   Rheumatoid arthritis, unspecified (HCC) 02/15/2023   Closed left hip fracture, initial encounter (HCC) 02/10/2023   Hip fracture (HCC) 02/10/2023   Chronic kidney disease, unspecified 01/03/2023   Idiopathic gout, unspecified site 01/03/2023   Non-pressure chronic ulcer of unspecified part of left lower leg limited to breakdown of skin (HCC) 09/27/2022   Chronic gouty arthritis 10/28/2021   OSA on CPAP 02/01/2021   CPAP use counseling 02/01/2021   Hypertension 02/01/2021   Obesity (BMI 30-39.9) 02/01/2021   Callus 07/13/2020   Pedal edema 11/08/2017   Choledocholithiasis    Sepsis (HCC) 02/05/2017   Fever 02/04/2017   Weakness 02/04/2017   Failure to thrive in adult 02/04/2017   OA (osteoarthritis) 02/04/2017  Status post total left knee replacement 09/19/2016   Hyperlipidemia, unspecified 08/09/2014   Type 2 diabetes mellitus without complications (HCC) 08/09/2014   Osteoporosis 10/04/2013   Elevated LFTs 01/23/2011   1. OSA on CPAP (Primary) The patient does tolerate PAP and reports  benefit from PAP use. Her apnea is uncontrolled with an AHI of 21.2 in the last year. She has a cpap only machine, which is not eligible for replacement until May 2026. I discussed with patient and her brother the risks of her uncontrolled apnea. The patient was reminded how to clean equipment and advised to replace  supplies routinely. The patient was also counselled on weight loss. The compliance is poor.  OSA- uncontrolled. Recommend cpap/bipap titration. F/u after setup.   2. CPAP use counseling CPAP Counseling: had a lengthy discussion with the patient regarding the importance of PAP therapy in management of the sleep apnea. Patient appears to understand the risk factor reduction and also understands the risks associated with untreated sleep apnea. Patient will try to make a good faith effort to remain compliant with therapy. Also instructed the patient on proper cleaning of the device including the water must be changed daily if possible and use of distilled water is preferred. Patient understands that the machine should be regularly cleaned with appropriate recommended cleaning solutions that do not damage the PAP machine for example given white vinegar and water rinses. Other methods such as ozone treatment may not be as good as these simple methods to achieve cleaning.   3. Hypertension, unspecified type Controlled with hydralazine , lisinopril . Continue.     General Counseling: I have discussed the findings of the evaluation and examination with Jersie.  I have also discussed any further diagnostic evaluation thatmay be needed or ordered today. Zayra verbalizes understanding of the findings of todays visit. We also reviewed her medications today and discussed drug interactions and side effects including but not limited excessive drowsiness and altered mental states. We also discussed that there is always a risk not just to her but also people around her. she has been encouraged to call the office with any questions or concerns that should arise related to todays visit.  No orders of the defined types were placed in this encounter.       I have personally obtained a history, examined the patient, evaluated laboratory and imaging results, formulated the assessment and plan and placed orders. This patient  was seen today by Lauraine Lay, PA-C in collaboration with Dr. Elfreda Bathe.   Elfreda DELENA Bathe, MD [facility] Diplomate ABMS Pulmonary Critical Care Medicine and Sleep Medicine

## 2024-02-05 ENCOUNTER — Ambulatory Visit (INDEPENDENT_AMBULATORY_CARE_PROVIDER_SITE_OTHER): Payer: MEDICARE | Admitting: Internal Medicine

## 2024-02-05 VITALS — BP 152/78 | HR 92 | Resp 16 | Ht 60.0 in | Wt 186.0 lb

## 2024-02-05 DIAGNOSIS — Z7189 Other specified counseling: Secondary | ICD-10-CM

## 2024-02-05 DIAGNOSIS — I10 Essential (primary) hypertension: Secondary | ICD-10-CM

## 2024-02-05 DIAGNOSIS — G4733 Obstructive sleep apnea (adult) (pediatric): Secondary | ICD-10-CM

## 2024-02-05 NOTE — Patient Instructions (Signed)

## 2024-02-21 ENCOUNTER — Emergency Department: Payer: MEDICARE

## 2024-02-21 ENCOUNTER — Encounter: Payer: Self-pay | Admitting: Intensive Care

## 2024-02-21 ENCOUNTER — Emergency Department
Admission: EM | Admit: 2024-02-21 | Discharge: 2024-02-22 | Disposition: A | Discharge status: Home / Self Care | Payer: MEDICARE | Attending: Emergency Medicine | Admitting: Emergency Medicine

## 2024-02-21 ENCOUNTER — Other Ambulatory Visit: Payer: Self-pay

## 2024-02-21 DIAGNOSIS — W19XXXA Unspecified fall, initial encounter: Secondary | ICD-10-CM | POA: Insufficient documentation

## 2024-02-21 DIAGNOSIS — I129 Hypertensive chronic kidney disease with stage 1 through stage 4 chronic kidney disease, or unspecified chronic kidney disease: Secondary | ICD-10-CM | POA: Insufficient documentation

## 2024-02-21 DIAGNOSIS — Y92002 Bathroom of unspecified non-institutional (private) residence single-family (private) house as the place of occurrence of the external cause: Secondary | ICD-10-CM | POA: Insufficient documentation

## 2024-02-21 DIAGNOSIS — S20212A Contusion of left front wall of thorax, initial encounter: Secondary | ICD-10-CM | POA: Insufficient documentation

## 2024-02-21 DIAGNOSIS — S299XXA Unspecified injury of thorax, initial encounter: Secondary | ICD-10-CM | POA: Diagnosis present

## 2024-02-21 DIAGNOSIS — S0990XA Unspecified injury of head, initial encounter: Secondary | ICD-10-CM | POA: Diagnosis not present

## 2024-02-21 DIAGNOSIS — Z86718 Personal history of other venous thrombosis and embolism: Secondary | ICD-10-CM | POA: Insufficient documentation

## 2024-02-21 DIAGNOSIS — Z7901 Long term (current) use of anticoagulants: Secondary | ICD-10-CM | POA: Insufficient documentation

## 2024-02-21 DIAGNOSIS — E1142 Type 2 diabetes mellitus with diabetic polyneuropathy: Secondary | ICD-10-CM | POA: Insufficient documentation

## 2024-02-21 DIAGNOSIS — N189 Chronic kidney disease, unspecified: Secondary | ICD-10-CM | POA: Diagnosis not present

## 2024-02-21 MED ORDER — HYDROCODONE-ACETAMINOPHEN 5-325 MG PO TABS
1.0000 | ORAL_TABLET | Freq: Once | ORAL | Status: AC
  Administered 2024-02-21: 1 via ORAL
  Filled 2024-02-21: qty 1

## 2024-02-21 NOTE — Discharge Instructions (Addendum)
 Your exam and CT scans as well as your x-ray are normal at this time.  No signs of any serious closed head injury, neck fracture, or rib injury.  Take your home meds as prescribed.  Follow-up with your primary provider or return to the ED if needed.

## 2024-02-21 NOTE — ED Provider Notes (Signed)
 [facility] Emergency Department Provider Note     Event Date/Time   First MD Initiated Contact with Patient 02/21/24 1858     (approximate)   History   Fall   HPI  Cheryl Rios is a 89 y.o. female with a history of HTN, DM type II, osteoporosis, neuropathy, arrhythmia, venous insufficiency, DVT on blood thinners, and CKD, presents to the ED via EMS from her facility.  Patient arrives from the [place] [facility] [place], after 2 falls in her bathroom today.  Patient ambulates with the use of a walker, describes losing her balance when she fell, with her walker.  She denies any loss of consciousness.  She also denies any frank head injury, nausea or vomiting, or preceding weakness.  Patient endorses left-sided chest pain when she takes a deep breath.   Physical Exam   Triage Vital Signs: ED Triage Vitals  Encounter Vitals Group     BP 02/21/24 1838 (!) 164/71     Girls Systolic BP Percentile --      Girls Diastolic BP Percentile --      Boys Systolic BP Percentile --      Boys Diastolic BP Percentile --      Pulse Rate 02/21/24 1838 95     Resp 02/21/24 1838 18     Temp 02/21/24 1838 97.6 F (36.4 C)     Temp Source 02/21/24 1838 Oral     SpO2 02/21/24 1838 94 %     Weight 02/21/24 1839 183 lb (83 kg)     Height 02/21/24 1839 5' 2 (1.575 m)     Head Circumference --      Peak Flow --      Pain Score 02/21/24 1839 9     Pain Loc --      Pain Education --      Exclude from Growth Chart --     Most recent vital signs: Vitals:   02/21/24 2055 02/21/24 2320  BP: (!) 119/50 (!) 140/59  Pulse: 90 91  Resp: 20 18  Temp:    SpO2: 96% 99%    General Awake, no distress. NAD A&O x 4 HEENT NCAT. PERRL. EOMI. No rhinorrhea. Mucous membranes are moist.  CV:  Good peripheral perfusion. RRR RESP:  Normal effort. CTA ABD:  No distention.  MSK:  AROM.  Stable RLE edema NEURO: Cranial nerves II to XII grossly intact.  ED Results / Procedures /  Treatments   Labs (all labs ordered are listed, but only abnormal results are displayed) Labs Reviewed - No data to display   EKG   RADIOLOGY  I personally viewed and evaluated these images as part of my medical decision making, as well as reviewing the written report by the radiologist.  ED Provider Interpretation: No acute head, cervical CT or chest findings  DG Ribs Unilateral W/Chest Left Result Date: 02/21/2024 CLINICAL DATA:  fall; left-sided pain EXAM: LEFT RIBS AND CHEST - 3+ VIEW COMPARISON:  02/10/2023 FINDINGS: The study is degraded by patient's body habitus. Diffuse osteopenia. No focal airspace consolidation, pleural effusion, or pneumothorax. No cardiomegaly. No acute, displaced rib fracture or destructive lesion. IMPRESSION: The study is degraded by patient's body habitus and underlying osteopenia. Otherwise, no acute, displaced fracture visualized. No pleural effusion or pneumothorax. Electronically Signed   By: Rogelia Myers M.D.   On: 02/21/2024 19:42   CT Head Wo Contrast Result Date: 02/21/2024 CLINICAL DATA:  Multiple fall left-sided pain EXAM: CT HEAD  WITHOUT CONTRAST CT CERVICAL SPINE WITHOUT CONTRAST TECHNIQUE: Multidetector CT imaging of the head and cervical spine was performed following the standard protocol without intravenous contrast. Multiplanar CT image reconstructions of the cervical spine were also generated. RADIATION DOSE REDUCTION: This exam was performed according to the departmental dose-optimization program which includes automated exposure control, adjustment of the mA and/or kV according to patient size and/or use of iterative reconstruction technique. COMPARISON:  CT brain and cervical spine 02/10/2023 FINDINGS: CT HEAD FINDINGS Brain: No acute territorial infarction, hemorrhage or intracranial mass. Atrophy and mild chronic small vessel ischemic changes of the white matter. The ventricles are nonenlarged. Vascular: No hyperdense vessels.  Carotid  vascular calcification Skull: Normal. Negative for fracture or focal lesion. Sinuses/Orbits: No acute finding. Other: None CT CERVICAL SPINE FINDINGS Alignment: Trace anterolisthesis C4 on C5. Facet alignment is maintained. Rotated appearance of C1 on C2 presumably due to positioning Skull base and vertebrae: No fracture. Soft tissues and spinal canal: No prevertebral fluid or swelling. No visible canal hematoma. Disc levels: Advanced C1-C2 degenerative changes. Multilevel degenerative changes. Advanced disc space narrowing C5-C6 and C7-T1. Posterior disc osteophyte at C3-C4 results in at least mild canal stenosis. Bulky posterior disc osteophyte complex at C5-C6 results in moderate canal stenosis. Multilevel hypertrophic facet degenerative change with foraminal narrowing. Facet ankylosis at multiple levels. Upper chest: No acute finding Other: None IMPRESSION: 1. No CT evidence for acute intracranial abnormality. Atrophy and chronic small vessel ischemic changes of the white matter. 2. Trace anterolisthesis C4 on C5, likely degenerative. No definitive acute fracture is seen. Electronically Signed   By: Luke Bun M.D.   On: 02/21/2024 19:37   CT Cervical Spine Wo Contrast Result Date: 02/21/2024 CLINICAL DATA:  Multiple fall left-sided pain EXAM: CT HEAD WITHOUT CONTRAST CT CERVICAL SPINE WITHOUT CONTRAST TECHNIQUE: Multidetector CT imaging of the head and cervical spine was performed following the standard protocol without intravenous contrast. Multiplanar CT image reconstructions of the cervical spine were also generated. RADIATION DOSE REDUCTION: This exam was performed according to the departmental dose-optimization program which includes automated exposure control, adjustment of the mA and/or kV according to patient size and/or use of iterative reconstruction technique. COMPARISON:  CT brain and cervical spine 02/10/2023 FINDINGS: CT HEAD FINDINGS Brain: No acute territorial infarction, hemorrhage or  intracranial mass. Atrophy and mild chronic small vessel ischemic changes of the white matter. The ventricles are nonenlarged. Vascular: No hyperdense vessels.  Carotid vascular calcification Skull: Normal. Negative for fracture or focal lesion. Sinuses/Orbits: No acute finding. Other: None CT CERVICAL SPINE FINDINGS Alignment: Trace anterolisthesis C4 on C5. Facet alignment is maintained. Rotated appearance of C1 on C2 presumably due to positioning Skull base and vertebrae: No fracture. Soft tissues and spinal canal: No prevertebral fluid or swelling. No visible canal hematoma. Disc levels: Advanced C1-C2 degenerative changes. Multilevel degenerative changes. Advanced disc space narrowing C5-C6 and C7-T1. Posterior disc osteophyte at C3-C4 results in at least mild canal stenosis. Bulky posterior disc osteophyte complex at C5-C6 results in moderate canal stenosis. Multilevel hypertrophic facet degenerative change with foraminal narrowing. Facet ankylosis at multiple levels. Upper chest: No acute finding Other: None IMPRESSION: 1. No CT evidence for acute intracranial abnormality. Atrophy and chronic small vessel ischemic changes of the white matter. 2. Trace anterolisthesis C4 on C5, likely degenerative. No definitive acute fracture is seen. Electronically Signed   By: Luke Bun M.D.   On: 02/21/2024 19:37     PROCEDURES:  Critical Care performed: No  Procedures  MEDICATIONS ORDERED IN ED: Medications  HYDROcodone -acetaminophen  (NORCO/VICODIN) 5-325 MG per tablet 1 tablet (1 tablet Oral Given 02/21/24 2055)     IMPRESSION / MDM / ASSESSMENT AND PLAN / ED COURSE  I reviewed the triage vital signs and the nursing notes.                              Differential diagnosis includes, but is not limited to, contusion, abrasion, laceration, SDH, cervical fracture, radiculopathy, pneumothorax, rib fracture, rib contusion, myalgias  Patient's presentation is most consistent with acute complicated  illness / injury requiring diagnostic workup.  Patient's diagnosis is consistent with injury sustained following a mechanical fall.  Geriatric patient presents to the ED via EMS from her facility, after mechanical fall in her bathroom.  She complained primarily of left-sided chest wall pain.  She denies any preceding injury, weakness, or paralysis.  CT imaging of the head and neck reviewed by me, shows no acute intra cranial or cervical pathology.  X-ray of the chest with left rib detail shows no acute rib fracture or pneumothorax.  Patient endorsing acute on chronic generalized pain, she requested pain management in the ED and was provided with a hydrocodone  pill.  Patient with recent exam and workup at this time stable for discharge and outpatient management.  Patient will be discharged home with instructions to take her home meds as prescribed. Patient is to follow up with her PCP as needed or otherwise directed. Patient is given ED precautions to return to the ED for any worsening or new symptoms.  FINAL CLINICAL IMPRESSION(S) / ED DIAGNOSES   Final diagnoses:  Fall in home, initial encounter  Chest wall contusion, left, initial encounter     Rx / DC Orders   ED Discharge Orders     None        Note:  This document was prepared using Dragon voice recognition software and may include unintentional dictation errors.    Loyd Candida LULLA Kings, PA-C 02/21/24 2325    Waymond Lorelle Cummins, MD 02/23/24 [phone]

## 2024-02-21 NOTE — ED Triage Notes (Signed)
 Arrived by [facility] from [facility] [place] [facility] [address] Two falls today in bathroom. Denies LOC  C/o left side pain and hurts when taking a deep breath. Unclear whether patient takes blood thinner or not   Baseline ambulates on her own   A&O x4 during triage

## 2024-03-15 ENCOUNTER — Ambulatory Visit (INDEPENDENT_AMBULATORY_CARE_PROVIDER_SITE_OTHER): Payer: MEDICARE | Admitting: Podiatry

## 2024-03-15 DIAGNOSIS — E1149 Type 2 diabetes mellitus with other diabetic neurological complication: Secondary | ICD-10-CM

## 2024-03-15 DIAGNOSIS — B351 Tinea unguium: Secondary | ICD-10-CM

## 2024-03-15 DIAGNOSIS — M79676 Pain in unspecified toe(s): Secondary | ICD-10-CM

## 2024-03-22 ENCOUNTER — Encounter: Payer: Self-pay | Admitting: Podiatry

## 2024-03-22 NOTE — Progress Notes (Signed)
  Subjective:  Patient ID: Cheryl Rios, female    DOB: 1926/02/24,  MRN: [id number]  Cheryl Rios presents to clinic today for at risk foot care with history of diabetic neuropathy and painful mycotic toenails x 10 which interfere with daily activities. Pain is relieved with periodic professional debridement. Her brother is present during today's visit. Chief Complaint  Patient presents with   Toe Pain    DFC. A1c is 6.2 Dr. Valora is her PCP. Last visit was in June.   New problem(s): None.   PCP is Valora Lynwood FALCON, MD.  No Known Allergies  Review of Systems: Negative except as noted in the HPI.  Objective: No changes noted in today's physical examination. There were no vitals filed for this visit. [name] is a pleasant 89 y.o. female in NAD. AAO x 3.  Vascular Examination: CFT <3 seconds b/l. DP/PT pulses faintly palpable b/l. Skin temperature gradient warm to warm b/l. No pain with calf compression. No ischemia or gangrene. No cyanosis or clubbing noted b/l. Trace edema noted right lower extremity. +1 pitting edema left lower extremity.   Neurological Examination: Protective sensation decreased with 10 gram monofilament b/l. Vibratory sensation intact b/l.  Dermatological Examination: Pedal skin warm and supple b/l.   No open wounds. No pressure injuries. No interdigital macerations.  Toenails 1-5 b/l thick, discolored, elongated with subungual debris and pain on dorsal palpation.    No hyperkeratotic nor porokeratotic lesions present on today's visit.  Musculoskeletal Examination: Muscle strength 4/5 to all lower extremity muscle groups bilaterally. Hammertoe(s) 2-5 b/l. Utilizes wheelchair for mobility assistance.  Radiographs: None  Assessment/Plan: 1. Pain due to onychomycosis of toenail   2. Type II diabetes mellitus with neurological manifestations [facility])   Consent given for treatment. Patient examined. All patient's and/or POA's  questions/concerns addressed on today's visit. Mycotic toenails 1-5 b/l debrided in length and girth without incident. Continue foot and shoe inspections daily. Monitor blood glucose per PCP/Endocrinologist's recommendations.Continue soft, supportive shoe gear daily. Report any pedal injuries to medical professional. Call office if there are any quesitons/concerns. -Patient/POA to call should there be question/concern in the interim.   Return in about 3 months (around 06/15/2024).  Delon LITTIE Merlin, DPM      [place] LOCATION: [address] [place] [address]                   Office [phone]   [facility] LOCATION: [address] [place], [place] [address] Office [phone]

## 2024-04-09 ENCOUNTER — Emergency Department: Payer: MEDICARE

## 2024-04-09 ENCOUNTER — Other Ambulatory Visit: Payer: Self-pay

## 2024-04-09 ENCOUNTER — Emergency Department
Admission: EM | Admit: 2024-04-09 | Discharge: 2024-04-11 | Disposition: A | Discharge status: Home / Self Care | Payer: MEDICARE | Attending: Emergency Medicine | Admitting: Emergency Medicine

## 2024-04-09 DIAGNOSIS — R109 Unspecified abdominal pain: Secondary | ICD-10-CM | POA: Diagnosis not present

## 2024-04-09 DIAGNOSIS — E119 Type 2 diabetes mellitus without complications: Secondary | ICD-10-CM | POA: Diagnosis not present

## 2024-04-09 DIAGNOSIS — I10 Essential (primary) hypertension: Secondary | ICD-10-CM | POA: Diagnosis not present

## 2024-04-09 DIAGNOSIS — S299XXA Unspecified injury of thorax, initial encounter: Secondary | ICD-10-CM | POA: Diagnosis present

## 2024-04-09 DIAGNOSIS — X58XXXA Exposure to other specified factors, initial encounter: Secondary | ICD-10-CM | POA: Insufficient documentation

## 2024-04-09 DIAGNOSIS — S2242XA Multiple fractures of ribs, left side, initial encounter for closed fracture: Secondary | ICD-10-CM | POA: Insufficient documentation

## 2024-04-09 LAB — CBC
HCT: 39 % (ref 36.0–46.0)
Hemoglobin: 12.7 g/dL (ref 12.0–15.0)
MCH: 30 pg (ref 26.0–34.0)
MCHC: 32.6 g/dL (ref 30.0–36.0)
MCV: 92 fL (ref 80.0–100.0)
Platelets: 275 K/uL (ref 150–400)
RBC: 4.24 MIL/uL (ref 3.87–5.11)
RDW: 17.1 % — ABNORMAL HIGH (ref 11.5–15.5)
WBC: 8.8 K/uL (ref 4.0–10.5)
nRBC: 0.2 % (ref 0.0–0.2)

## 2024-04-09 LAB — BASIC METABOLIC PANEL WITH GFR
Anion gap: 13 (ref 5–15)
BUN: 17 mg/dL (ref 8–23)
CO2: 24 mmol/L (ref 22–32)
Calcium: 9.4 mg/dL (ref 8.9–10.3)
Chloride: 101 mmol/L (ref 98–111)
Creatinine, Ser: 0.74 mg/dL (ref 0.44–1.00)
GFR, Estimated: >60 mL/min (ref >60)
Glucose, Bld: 150 mg/dL — ABNORMAL HIGH (ref 70–99)
Potassium: 4.9 mmol/L (ref 3.5–5.1)
Sodium: 137 mmol/L (ref 135–145)

## 2024-04-09 MED ORDER — IOHEXOL 350 MG/ML SOLN
100.0000 mL | Freq: Once | INTRAVENOUS | Status: AC | PRN
  Administered 2024-04-09: 100 mL via INTRAVENOUS

## 2024-04-09 MED ORDER — DULOXETINE HCL 20 MG PO CPEP
40.0000 mg | ORAL_CAPSULE | Freq: Every day | ORAL | Status: DC
  Administered 2024-04-10 – 2024-04-11 (×2): 40 mg via ORAL
  Filled 2024-04-09 (×2): qty 2

## 2024-04-09 MED ORDER — HYDRALAZINE HCL 50 MG PO TABS
50.0000 mg | ORAL_TABLET | Freq: Every day | ORAL | Status: DC
  Administered 2024-04-11: 50 mg via ORAL
  Filled 2024-04-09: qty 1

## 2024-04-09 MED ORDER — FOLIC ACID 1 MG PO TABS
1.0000 mg | ORAL_TABLET | Freq: Every day | ORAL | Status: DC
  Administered 2024-04-10 – 2024-04-11 (×2): 1 mg via ORAL
  Filled 2024-04-09 (×2): qty 1

## 2024-04-09 MED ORDER — GABAPENTIN 100 MG PO CAPS
200.0000 mg | ORAL_CAPSULE | Freq: Three times a day (TID) | ORAL | Status: DC
  Administered 2024-04-09 – 2024-04-11 (×5): 200 mg via ORAL
  Filled 2024-04-09 (×5): qty 2

## 2024-04-09 MED ORDER — APIXABAN 5 MG PO TABS
5.0000 mg | ORAL_TABLET | Freq: Two times a day (BID) | ORAL | Status: DC
  Administered 2024-04-09 – 2024-04-11 (×4): 5 mg via ORAL
  Filled 2024-04-09 (×4): qty 1

## 2024-04-09 MED ORDER — OXYCODONE-ACETAMINOPHEN 5-325 MG PO TABS
1.0000 | ORAL_TABLET | Freq: Four times a day (QID) | ORAL | Status: DC | PRN
  Administered 2024-04-09 – 2024-04-11 (×3): 1 via ORAL
  Filled 2024-04-09 (×3): qty 1

## 2024-04-09 MED ORDER — ADULT MULTIVITAMIN W/MINERALS CH
1.0000 | ORAL_TABLET | Freq: Every day | ORAL | Status: DC
  Administered 2024-04-10 – 2024-04-11 (×2): 1 via ORAL
  Filled 2024-04-09 (×2): qty 1

## 2024-04-09 MED ORDER — ALLOPURINOL 300 MG PO TABS
150.0000 mg | ORAL_TABLET | Freq: Every day | ORAL | Status: DC
  Administered 2024-04-10 – 2024-04-11 (×2): 150 mg via ORAL
  Filled 2024-04-09 (×2): qty 0.5

## 2024-04-09 MED ORDER — LEVOTHYROXINE SODIUM 50 MCG PO TABS
75.0000 ug | ORAL_TABLET | Freq: Every day | ORAL | Status: DC
  Administered 2024-04-10 – 2024-04-11 (×2): 75 ug via ORAL
  Filled 2024-04-09 (×2): qty 2

## 2024-04-09 MED ORDER — LISINOPRIL 10 MG PO TABS
20.0000 mg | ORAL_TABLET | Freq: Every day | ORAL | Status: DC
  Administered 2024-04-10 – 2024-04-11 (×2): 20 mg via ORAL
  Filled 2024-04-09 (×2): qty 2

## 2024-04-09 NOTE — ED Provider Notes (Signed)
 [facility] Provider Note    Event Date/Time   First MD Initiated Contact with Patient 04/09/24 1509     (approximate)  History   Chief Complaint: Shortness of Breath  HPI  Cheryl Rios is a 89 y.o. female with a past medical history of diabetes, arthritis, hypertension, presents to the emergency department for left-sided chest/abdominal pain.  According to the patient she felt well yesterday however this morning and overnight developed a pain in the left side of her chest into the left abdomen and has been feeling more short of breath.  Brother who is here with the patient states she is also been coughing a bit.  No fever.  Patient states the pain is worse when she moves.  Denies any trauma.  Physical Exam   Triage Vital Signs: ED Triage Vitals  Encounter Vitals Group     BP 04/09/24 1201 (!) 143/71     Girls Systolic BP Percentile --      Girls Diastolic BP Percentile --      Boys Systolic BP Percentile --      Boys Diastolic BP Percentile --      Pulse Rate 04/09/24 1201 91     Resp 04/09/24 1201 18     Temp 04/09/24 1201 (!) 96.6 F (35.9 C)     Temp Source 04/09/24 1201 Axillary     SpO2 04/09/24 1201 100 %     Weight 04/09/24 1205 183 lb (83 kg)     Height 04/09/24 1205 5' 1 (1.549 m)     Head Circumference --      Peak Flow --      Pain Score 04/09/24 1203 9     Pain Loc --      Pain Education --      Exclude from Growth Chart --     Most recent vital signs: Vitals:   04/09/24 1515 04/09/24 1536  BP:  (!) 160/76  Pulse: 86 86  Resp: 15 20  Temp:    SpO2: 100% 100%    General: Awake, no distress.  CV:  Good peripheral perfusion.  Regular rate and rhythm  Resp:  Normal effort.  Equal breath sounds bilaterally.  Abd:  Mild distention of the abdomen however patient states this is normal.  Mild to moderate left sided tenderness of the left lateral lower chest into the left abdomen.  ED Results / Procedures / Treatments    EKG  EKG viewed and interpreted by myself shows a normal sinus rhythm 89 bpm with a narrow QRS, normal axis, normal intervals, nonspecific ST changes without ST elevation.  RADIOLOGY  I have reviewed and interpreted the chest x-ray images.  There is no obvious consolidation on my evaluation. Radiology is reading haziness in the right mid lung and retrocardiac opacity could represent atelectasis or pneumonia.   MEDICATIONS ORDERED IN ED: Medications - No data to display   IMPRESSION / MDM / ASSESSMENT AND PLAN / ED COURSE  I reviewed the triage vital signs and the nursing notes.  Patient's presentation is most consistent with acute presentation with potential threat to life or bodily function.  Patient presents emergency department for left-sided chest and abdominal pain worse with movement that started overnight.  Here the patient appears well but does appear uncomfortable when she tries to move herself in bed or with left-sided abdominal/lower chest palpation.  They also noted at her facility that she seemed more short of breath today and the brother  who is here with the patient states she has been coughing a bit as well.  X-ray there reading is possible right sided pneumonia although the patient's symptoms are all left-sided.  Patient does have some abdominal distention although she states this is normal for her but she does have mild to moderate left-sided abdominal tenderness to palpation which is abnormal.  Patient's lab work is largely nonrevealing with a normal CBC and a normal chemistry including renal function.  I have added on a troponin LFTs and lipase as a precaution.  Given the patient's abdominal distention with left-sided tenderness as well as the possible pneumonia on CT we will proceed with CTA imaging of the chest abdomen and pelvis to evaluate the aorta as well as evaluate for any possible pneumonia or intra-abdominal pathology.  Patient and brother agreeable to plan.  CBC  is normal, chemistry is normal.  CT scan has resulted showing left-sided 6th and 9th rib fracture, very likely the cause of the patient's pain.  Patient is requiring at least 2 person assist in the emergency department due to pain with any attempted movement.  She lives in assisted living facility, per brother there is only 1 aide to help her at times.  They state last time patient needed more assistance they benefited from going to a short-term rehab facility such as [facility].  They believe that the patient is unable to return to her current nursing facility due to her increased assistance need.  We will have social work and PT evaluate in the morning to see if we can get the patient to a more advanced or rehab facility.  FINAL CLINICAL IMPRESSION(S) / ED DIAGNOSES   Chest pain Abdominal pain Multiple rib fracture  Note:  This document was prepared using Dragon voice recognition software and may include unintentional dictation errors.   [name], MD 04/09/24 1921

## 2024-04-09 NOTE — ED Notes (Signed)
 Patient placed on purewick due to being a heavy assist and currently SOB.

## 2024-04-09 NOTE — ED Triage Notes (Signed)
 Pt arrives from the [facility] [place] and was brought in by their brother. Pt states that they are having SOB, and pain on the left side of their chest under their breast that started this morning. Pt states that when they move is when they hurt. Pt is A&Ox4 during triage.

## 2024-04-10 NOTE — TOC Initial Note (Signed)
 Transition of Care [facility]) - Progression Note    Patient Details  Name: Cheryl Rios MRN: [id number] Date of Birth: 1925-06-15  [facility] CM/SW Contact  Kaide Gage L Tashira Torre, [place] Phone Number: 04/10/2024, 3:13 PM  Clinical Narrative:     Brief assessment completed. Patient is hard of hearing. Patient advised that she resides at [facility] [address] She stated that is agreeable to going to SNF. She reported that she would like to go to [facility].   FL2 will be completed and a bed search will be initiated. Per chart review, patient has had a lot of recent falls.                     Expected Discharge Plan and Services                                               Social Drivers of Health (SDOH) Interventions SDOH Screenings   Food Insecurity: No Food Insecurity (04/26/2023)  Housing: Unknown (06/29/2023)   Received from [facility]  Transportation Needs: No Transportation Needs (04/26/2023)  Utilities: Not At Risk (04/26/2023)  Financial Resource Strain: Low Risk  (01/19/2023)   Received from [facility]  Tobacco Use: Low Risk  (04/09/2024)    Readmission Risk Interventions     No data to display

## 2024-04-10 NOTE — Evaluation (Signed)
 Physical Therapy Evaluation Patient Details Name: Cheryl Rios MRN: [id number] DOB: 11-11-1925 Today's Date: 04/10/2024  History of Present Illness  89 y.o. female with a past medical history of diabetes, arthritis, hypertension, presents to the emergency department for left-sided chest/abdominal pain.  Pt has had 3 falls in the preceeding week, increasing pain in the left ribs (found to have fractures) has been feeling more short of breath.  Clinical Impression  Pt pleasant and willing to work with PT despite c/o significant pain in L ribs.  She showed good overall tolerance and ability to do some limited in room ambulation but did need significant assist with bed mobility tasks (at baseline pt sleeps in lift chair).  Pt's O2 remained in the mid 90s on room air t/o the session and overall pt moved with slow and guarded effort but did not have any overt safety issues (LOBs, buckling, etc) but was clearly reliant on the walker.  Pt has had multiple falls in recent weeks and months.  Pt will benefit from continued PT to address functional limitations.       If plan is discharge home, recommend the following: A little help with walking and/or transfers;A little help with bathing/dressing/bathroom;Assistance with cooking/housework;Assistance with feeding;Assist for transportation;Help with stairs or ramp for entrance   Can travel by private vehicle   Yes    Equipment Recommendations None recommended by PT  Recommendations for Other Services       Functional Status Assessment Patient has had a recent decline in their functional status and demonstrates the ability to make significant improvements in function in a reasonable and predictable amount of time.     Precautions / Restrictions Precautions Precautions: Fall Restrictions Weight Bearing Restrictions Per Provider Order: No      Mobility  Bed Mobility Overal bed mobility: Needs Assistance Bed Mobility: Supine to Sit, Sit to  Supine     Supine to sit: Min assist Sit to supine: Max assist, Mod assist   General bed mobility comments: Pt showed good effort getting LEs toward EOB and trying to rise to sitting, needed heavier assist to get LEs back up into bed    Transfers Overall transfer level: Needs assistance Equipment used: Rolling walker (2 wheels) Transfers: Sit to/from Stand Sit to Stand: Min assist           General transfer comment: higher ED gurney (uses lift chair at baseline) pt needed PT to stabilize walker to pull up from it - did need cuing for appropriate sequencing    Ambulation/Gait Ambulation/Gait assistance: Contact guard assist Gait Distance (Feet): 60 Feet Assistive device: Rolling walker (2 wheels)         General Gait Details: Pt with slow, forward flexed posture and limited tolerance 2/2 rib pain but did not have any LOBs and vitals remained stable and appropriate (SpO2 remained in mid 90s on room air t/o the effort)  Stairs            Wheelchair Mobility     Tilt Bed    Modified Rankin (Stroke Patients Only)       Balance Overall balance assessment: Needs assistance Sitting-balance support: No upper extremity supported Sitting balance-Leahy Scale: Good     Standing balance support: Bilateral upper extremity supported Standing balance-Leahy Scale: Fair Standing balance comment: leaning forward on walker w/o LOBs or overt unsteadiness  Pertinent Vitals/Pain Pain Assessment Pain Assessment: 0-10 Pain Score: 7  Pain Location: L ribs    Home Living Family/patient expects to be discharged to:: Skilled nursing facility                   Additional Comments: lives at ALF, has been needing more assist recently    Prior Function Prior Level of Function : Needs assist             Mobility Comments: w/c when out of facility (recently even to dining hall) but gets around limited distances with RW  (bathroom, etc) ADLs Comments: staff gives bathes, will help clean up after BM, etc     Extremity/Trunk Assessment   Upper Extremity Assessment Upper Extremity Assessment: Generalized weakness;Overall [facility] for tasks assessed    Lower Extremity Assessment Lower Extremity Assessment: Generalized weakness;Overall WFL for tasks assessed       Communication   Communication Communication: No apparent difficulties    Cognition Arousal: Alert Behavior During Therapy: WFL for tasks assessed/performed   PT - Cognitive impairments: No apparent impairments                         Following commands: Intact       Cueing       General Comments General comments (skin integrity, edema, etc.): Pt had some expected pain with the    Exercises     Assessment/Plan    PT Assessment Patient needs continued PT services  PT Problem List Decreased strength;Decreased range of motion;Decreased activity tolerance;Decreased balance;Decreased safety awareness;Pain       PT Treatment Interventions DME instruction;Functional mobility training;Therapeutic activities;Therapeutic exercise;Gait training;Balance training;Patient/family education    PT Goals (Current goals can be found in the Care Plan section)  Acute Rehab PT Goals Patient Stated Goal: control rib pain PT Goal Formulation: With patient Time For Goal Achievement: 04/23/24 Potential to Achieve Goals: Fair    Frequency Min 2X/week     Co-evaluation               AM-PAC PT 6 Clicks Mobility  Outcome Measure Help needed turning from your back to your side while in a flat bed without using bedrails?: A Little Help needed moving from lying on your back to sitting on the side of a flat bed without using bedrails?: A Lot Help needed moving to and from a bed to a chair (including a wheelchair)?: A Lot Help needed standing up from a chair using your arms (e.g., wheelchair or bedside chair)?: A Lot Help needed to  walk in hospital room?: A Little Help needed climbing 3-5 steps with a railing? : A Lot 6 Click Score: 14    End of Session Equipment Utilized During Treatment: Gait belt Activity Tolerance: Patient tolerated treatment well Patient left: in bed Nurse Communication: Mobility status PT Visit Diagnosis: Muscle weakness (generalized) (M62.81);Difficulty in walking, not elsewhere classified (R26.2)    Time: 8974-8950 PT Time Calculation (min) (ACUTE ONLY): 24 min   Charges:   PT Evaluation $PT Eval Low Complexity: 1 Low PT Treatments $Gait Training: 8-22 mins PT General Charges $$ ACUTE PT VISIT: 1 Visit         Carmin JONELLE Deed, DPT 04/10/2024, 1:01 PM

## 2024-04-10 NOTE — NC FL2 (Signed)
 [place]  MEDICAID FL2 LEVEL OF CARE FORM     IDENTIFICATION  Patient Name: Cheryl Rios Birthdate: Jun 12, 1925 Sex: female Admission Date (Current Location): 04/09/2024  [facility] and [place] Number:  Chiropodist and Address:  [facility], [address] [place], [place] [address]      Provider Number: [id number]  Attending Physician Name and Address:  Viviann Pastor, MD  Relative Name and Phone Number:  Cleotilde Lung (Brother)  [phone] (Home Phone)    Current Level of Care: Hospital Recommended Level of Care: Skilled Nursing Facility Prior Approval Number:    Date Approved/Denied: 07/04/12 PASRR Number: [id number]  Discharge Plan: SNF    Current Diagnoses: Patient Active Problem List   Diagnosis Date Noted   Acute deep vein thrombosis (DVT) of femoral vein of left lower extremity (HCC) 04/26/2023   Pressure injury of skin 04/26/2023   Acute deep vein thrombosis (DVT) of both lower extremities (HCC) 04/25/2023   Pulmonary embolism on right 04/25/2023   CKD stage 3a, GFR 45-59 ml/min (HCC) 04/25/2023   Dysphagia, oropharyngeal phase 02/16/2023   Hypothyroidism, unspecified 02/15/2023   Rheumatoid arthritis, unspecified (HCC) 02/15/2023   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) 02/15/2023   Depressive disorder 02/15/2023   Closed left hip fracture, initial encounter (HCC) 02/10/2023   Hip fracture (HCC) 02/10/2023   Chronic kidney disease, unspecified 01/03/2023   Idiopathic gout, unspecified site 01/03/2023   Non-pressure chronic ulcer of unspecified part of left lower leg limited to breakdown of skin (HCC) 09/27/2022   Chronic gouty arthritis 10/28/2021   OSA on CPAP 02/01/2021   CPAP use counseling 02/01/2021   Hypertension 02/01/2021   Obesity (BMI 30-39.9) 02/01/2021   Callus 07/13/2020   Pedal edema 11/08/2017   Choledocholithiasis    Sepsis (HCC) 02/05/2017   Fever 02/04/2017    Weakness 02/04/2017   Failure to thrive in adult 02/04/2017   OA (osteoarthritis) 02/04/2017   Status post total left knee replacement 09/19/2016   Hyperlipidemia, unspecified 08/09/2014   Type 2 diabetes mellitus without complications (HCC) 08/09/2014   Osteoporosis 10/04/2013   Elevated LFTs 01/23/2011    Orientation RESPIRATION BLADDER Height & Weight     Self, Situation  O2 Incontinent Weight: 183 lb (83 kg) Height:  5' 1 (154.9 cm)  BEHAVIORAL SYMPTOMS/MOOD NEUROLOGICAL BOWEL NUTRITION STATUS      Incontinent Diet (Diet regular Room service appropriate? Yes; Fluid consistency: Thin: General starting at 11/18 1818)  AMBULATORY STATUS COMMUNICATION OF NEEDS Skin   Extensive Assist Verbally Bruising                       Personal Care Assistance Level of Assistance  Bathing, Feeding, Dressing Bathing Assistance: Maximum assistance Feeding assistance: Independent Dressing Assistance: Maximum assistance     Functional Limitations Info  Hearing   Hearing Info: Impaired      SPECIAL CARE FACTORS FREQUENCY  PT (By licensed PT)     PT Frequency: 2x              Contractures Contractures Info: Not present    Additional Factors Info  Allergies   Allergies Info: NKA           Current Medications (04/10/2024):  This is the current hospital active medication list Current Facility-Administered Medications  Medication Dose Route Frequency Provider Last Rate Last Admin   allopurinol  (ZYLOPRIM ) tablet 150 mg  150 mg Oral Daily Paduchowski, Kevin, MD   150  mg at 04/10/24 0900   apixaban  (ELIQUIS ) tablet 5 mg  5 mg Oral BID Paduchowski, Kevin, MD   5 mg at 04/10/24 0900   DULoxetine  (CYMBALTA ) DR capsule 40 mg  40 mg Oral Daily Paduchowski, Kevin, MD   40 mg at 04/10/24 0900   folic acid  (FOLVITE ) tablet 1 mg  1 mg Oral Daily Paduchowski, Kevin, MD   1 mg at 04/10/24 0900   gabapentin  (NEURONTIN ) capsule 200 mg  200 mg Oral TID Paduchowski, Kevin, MD   200 mg at  04/10/24 1521   hydrALAZINE  (APRESOLINE ) tablet 50 mg  50 mg Oral Daily Paduchowski, Kevin, MD       levothyroxine  (SYNTHROID ) tablet 75 mcg  75 mcg Oral Q0600 Paduchowski, Kevin, MD   75 mcg at 04/10/24 0620   lisinopril  (ZESTRIL ) tablet 20 mg  20 mg Oral Daily Paduchowski, Kevin, MD   20 mg at 04/10/24 0900   multivitamin with minerals tablet 1 tablet  1 tablet Oral Daily Paduchowski, Kevin, MD   1 tablet at 04/10/24 0900   oxyCODONE -acetaminophen  (PERCOCET/ROXICET) 5-325 MG per tablet 1 tablet  1 tablet Oral Q6H PRN Paduchowski, Kevin, MD   1 tablet at 04/10/24 0845   Current Outpatient Medications  Medication Sig Dispense Refill   acetaminophen  (TYLENOL ) 500 MG tablet Take 2 tablets (1,000 mg total) by mouth every 8 (eight) hours as needed for mild pain, fever or headache.     allopurinol  (ZYLOPRIM ) 100 MG tablet Take 1.5 tablets by mouth daily.     apixaban  (ELIQUIS ) 5 MG TABS tablet Take 1 tablet (5 mg total) by mouth 2 (two) times daily. 60 tablet 5   diphenhydrAMINE  (BENADRYL ) 25 MG tablet Take 25 mg by mouth every 6 (six) hours as needed for allergies.     DULoxetine  HCl 40 MG CPEP Take 1 capsule by mouth daily.     folic acid  (FOLVITE ) 1 MG tablet Take 1 mg by mouth daily.     gabapentin  (NEURONTIN ) 100 MG capsule Take 200 mg by mouth 3 (three) times daily.     hydrALAZINE  (APRESOLINE ) 50 MG tablet Take 50 mg by mouth daily.     levothyroxine  (SYNTHROID , LEVOTHROID) 75 MCG tablet TAKE 1 TABLET BY MOUTH ONCE DAILY ON EMPTY STOMACH WITH WATER 30-60 MIN BEFORE BREAKFAST     lisinopril  (ZESTRIL ) 20 MG tablet Take 20 mg by mouth daily.     loperamide (IMODIUM) 2 MG capsule Take 2 mg by mouth as needed for diarrhea or loose stools.     methotrexate (RHEUMATREX) 2.5 MG tablet Take 15 mg by mouth once a week. On fridays     Multiple Vitamins-Minerals (SENIOR TABS) TABS Take 1 tablet by mouth daily.     nystatin cream (MYCOSTATIN) Apply 1 Application topically 2 (two) times daily.      traMADol (ULTRAM) 50 MG tablet Take 50 mg by mouth every 12 (twelve) hours as needed for moderate pain (pain score 4-6) or severe pain (pain score 7-10).     ammonium lactate  (AMLACTIN) 12 % cream Apply 1 Application topically as needed for dry skin. (Patient not taking: Reported on 04/09/2024) 385 g 2   feeding supplement, GLUCERNA SHAKE, (GLUCERNA SHAKE) LIQD Take 237 mLs by mouth 3 (three) times daily between meals.     ketoconazole  (NIZORAL ) 2 % cream Apply 1 Application topically daily. (Patient not taking: Reported on 04/09/2024) 60 g 2   Multiple Vitamin (MULTIVITAMIN WITH MINERALS) TABS tablet Take 1 tablet by mouth daily. (Patient not  taking: Reported on 04/09/2024)       Discharge Medications: Please see discharge summary for a list of discharge medications.  Relevant Imaging Results:  Relevant Lab Results:   Additional Information [id number]  [address] [address] [place], [place]

## 2024-04-10 NOTE — ED Notes (Addendum)
 Resumed care from Stark t rn.  Pt alert  meds given.  Pt resting    iv in place.   Purewick in place.

## 2024-04-10 NOTE — ED Notes (Signed)
 Patient declined to eat dinner tray.

## 2024-04-10 NOTE — ED Notes (Signed)
 Meds given.  Resting quietly

## 2024-04-10 NOTE — ED Notes (Signed)
 Patient moved into a hospital bed and linens were changed. Patient placed in a clean brief and peri care was provided. Bed placed in lowest position with call light in reach and bed alarm activated.

## 2024-04-10 NOTE — ED Notes (Signed)
 Pt eating dinner meal  ?

## 2024-04-11 MED ORDER — LIDOCAINE 5 % EX PTCH: 1.0000 | MEDICATED_PATCH | Freq: Two times a day (BID) | CUTANEOUS | 1 refills | Status: DC

## 2024-04-11 NOTE — ED Provider Notes (Signed)
 Emergency Medicine Observation Re-evaluation Note   BP (!) 100/49   Pulse 87   Temp 97.6 F (36.4 C) (Oral)   Resp 20   Ht 5' 1 (1.549 m)   Wt 83 kg   SpO2 94%   BMI 34.58 kg/m    ED Course / MDM   No reported events during my shift at the time of this note.   Pt is awaiting dispo from consultants   Ginnie Shams MD    Shams Ginnie, MD 04/11/24 [phone]

## 2024-04-11 NOTE — ED Notes (Signed)
 Patient was picked up by brother to be transported back to [facility] [address]. Assistance was offered to get patient into vehicle, brother declined offer.

## 2024-04-11 NOTE — ED Provider Notes (Signed)
 Patient seen and evaluated by social work who is arrange safe discharge with family.  I placed home health orders.  Discharged with lidocaine  patches due to rib fractures, discussed Tylenol  dosing in addition to this.  Patient suitable for outpatient management with family.   Claudene Rover, MD 04/11/24 1250

## 2024-04-11 NOTE — Discharge Instructions (Addendum)
Use Tylenol for pain and fevers.  Up to 1000 mg per dose, up to 4 times per day.  Do not take more than 4000 mg of Tylenol/acetaminophen within 24 hours.. ° °Please use lidocaine patches at your site of pain.  Apply 1 patch at a time, leave on for 12 hours, then remove for 12 hours.  12 hours on, 12 hours off.  Do not apply more than 1 patch at a time. ° °

## 2024-04-11 NOTE — ED Notes (Signed)
 Patient provided phone to call her brother.

## 2024-04-22 ENCOUNTER — Ambulatory Visit (INDEPENDENT_AMBULATORY_CARE_PROVIDER_SITE_OTHER): Payer: MEDICARE | Admitting: Vascular Surgery

## 2024-04-22 ENCOUNTER — Encounter (INDEPENDENT_AMBULATORY_CARE_PROVIDER_SITE_OTHER): Payer: Self-pay | Admitting: Vascular Surgery

## 2024-04-22 VITALS — BP 149/86 | HR 92 | Resp 18 | Ht 61.5 in | Wt 183.0 lb

## 2024-04-22 DIAGNOSIS — E782 Mixed hyperlipidemia: Secondary | ICD-10-CM

## 2024-04-22 DIAGNOSIS — Z86711 Personal history of pulmonary embolism: Secondary | ICD-10-CM | POA: Insufficient documentation

## 2024-04-22 DIAGNOSIS — E119 Type 2 diabetes mellitus without complications: Secondary | ICD-10-CM

## 2024-04-22 DIAGNOSIS — I82509 Chronic embolism and thrombosis of unspecified deep veins of unspecified lower extremity: Secondary | ICD-10-CM | POA: Insufficient documentation

## 2024-04-22 DIAGNOSIS — I10 Essential (primary) hypertension: Secondary | ICD-10-CM

## 2024-04-22 DIAGNOSIS — I82513 Chronic embolism and thrombosis of femoral vein, bilateral: Secondary | ICD-10-CM | POA: Diagnosis not present

## 2024-04-22 NOTE — Progress Notes (Signed)
 MRN : [id number]  Cheryl Rios is a 89 y.o. ([birth year]) female who presents with chief complaint of legs hurt and swell.  History of Present Illness:   The patient is a 89 year old female who presents today for follow-up evaluation after an extensive left lower extremity DVT the patient underwent thrombectomy.  Thrombectomy was performed April 28, 2023.  At that time she was also found to have a pulmonary embolism and so we committed to anticoagulation for 1 year.  She continues to have some swelling in the left leg but it is improved from where it was previously. Currently there are no open wounds or ulcerations. Currently she is elevating her lower extremities and continuing to work with physical therapy for activity. In addition her family helps to walk with her as well.  Of concern she has had 4 falls and sustained multiple rib fractures with 1 of these episodes another she sustained a very large skin tear on her arm which was problematic to heal.  Previous noninvasive studies show there chronic changes in the left lower extremity although there is evidence of healing such as recannulization as well as a smaller clot burden   Current Meds  Medication Sig   acetaminophen  (TYLENOL ) 500 MG tablet Take 2 tablets (1,000 mg total) by mouth every 8 (eight) hours as needed for mild pain, fever or headache.   allopurinol  (ZYLOPRIM ) 100 MG tablet Take 1.5 tablets by mouth daily.   apixaban  (ELIQUIS ) 5 MG TABS tablet Take 1 tablet (5 mg total) by mouth 2 (two) times daily.   diphenhydrAMINE  (BENADRYL ) 25 MG tablet Take 25 mg by mouth every 6 (six) hours as needed for allergies.   DULoxetine  HCl 40 MG CPEP Take 1 capsule by mouth daily.   feeding supplement, GLUCERNA SHAKE, (GLUCERNA SHAKE) LIQD Take 237 mLs by mouth 3 (three) times daily between meals.   folic acid  (FOLVITE ) 1 MG tablet Take 1 mg by mouth daily.   gabapentin  (NEURONTIN ) 100 MG capsule Take 200 mg by mouth 3 (three)  times daily.   hydrALAZINE  (APRESOLINE ) 50 MG tablet Take 50 mg by mouth daily.   levothyroxine  (SYNTHROID , LEVOTHROID) 75 MCG tablet TAKE 1 TABLET BY MOUTH ONCE DAILY ON EMPTY STOMACH WITH WATER 30-60 MIN BEFORE BREAKFAST   lidocaine  (LIDODERM ) 5 % Place 1 patch onto the skin every 12 (twelve) hours. Remove & Discard patch within 12 hours or as directed by MD   lisinopril  (ZESTRIL ) 20 MG tablet Take 20 mg by mouth daily.   loperamide (IMODIUM) 2 MG capsule Take 2 mg by mouth as needed for diarrhea or loose stools.   methotrexate (RHEUMATREX) 2.5 MG tablet Take 15 mg by mouth once a week. On fridays   Multiple Vitamins-Minerals (SENIOR TABS) TABS Take 1 tablet by mouth daily.   nystatin cream (MYCOSTATIN) Apply 1 Application topically 2 (two) times daily.   traMADol (ULTRAM) 50 MG tablet Take 50 mg by mouth every 12 (twelve) hours as needed for moderate pain (pain score 4-6) or severe pain (pain score 7-10).    Past Medical History:  Diagnosis Date   Arrhythmia    Arthritis    Diabetes mellitus without complication (HCC)    Diverticulosis    Dysrhythmia    Edema of both feet    HOH (hard of hearing)    Hypertension    Hypothyroidism    Neuropathy    Osteoporosis    Sleep apnea    OSA--Use C-PAP  Thyroid disease     Past Surgical History:  Procedure Laterality Date   ABDOMINAL HYSTERECTOMY     CATARACT EXTRACTION W/PHACO Right 07/19/2016   Procedure: CATARACT EXTRACTION PHACO AND INTRAOCULAR LENS PLACEMENT (IOC);  Surgeon: Dene Etienne, MD;  Location: [facility];  Service: Ophthalmology;  Laterality: Right;  US  0:55.8 AP% 14.3 CDE 9.52 Fluid pack lot # [id number]   CATARACT EXTRACTION W/PHACO Left 08/18/2016   Procedure: CATARACT EXTRACTION PHACO AND INTRAOCULAR LENS PLACEMENT (IOC);  Surgeon: Dene Etienne, MD;  Location: [facility];  Service: Ophthalmology;  Laterality: Left;  US1:03.1 AP%20.6 CDE13.2 Fluid pack lot # [id number]   CHOLECYSTECTOMY     ERCP N/A  02/07/2017   Procedure: ENDOSCOPIC RETROGRADE CHOLANGIOPANCREATOGRAPHY (ERCP);  Surgeon: Jinny Carmine, MD;  Location: [facility];  Service: Endoscopy;  Laterality: N/A;   FEMUR IM NAIL Left 02/11/2023   Procedure: INTRAMEDULLARY (IM) NAIL FEMORAL;  Surgeon: Zafonte, Brian Thomas, MD;  Location: [facility];  Service: Orthopedics;  Laterality: Left;   GALLBLADDER SURGERY     HEMORRHOID SURGERY     JOINT REPLACEMENT Bilateral    KNEE REPLACEMENT   KNEE SURGERY Right    PERIPHERAL VASCULAR THROMBECTOMY Left 04/28/2023   Procedure: PERIPHERAL VASCULAR THROMBECTOMY;  Surgeon: Jama Cordella MATSU, MD;  Location: [facility]IVE [facility];  Service: Cardiovascular;  Laterality: Left;    Social History Social History   Tobacco Use   Smoking status: Never   Smokeless tobacco: Never  Vaping Use   Vaping status: Never Used  Substance Use Topics   Alcohol use: No   Drug use: No    Family History Family History  Problem Relation Age of Onset   Breast cancer Neg Hx     No Known Allergies   REVIEW OF SYSTEMS (Negative unless checked)  Constitutional: [] Weight loss  [] Fever  [] Chills Cardiac: [] Chest pain   [] Chest pressure   [] Palpitations   [] Shortness of breath when laying flat   [] Shortness of breath with exertion. Vascular:  [] Pain in legs with walking   [x] Pain in legs at rest  [] History of DVT   [] Phlebitis   [x] Swelling in legs   [] Varicose veins   [] Non-healing ulcers Pulmonary:   [] Uses home oxygen   [] Productive cough   [] Hemoptysis   [] Wheeze  [] COPD   [] Asthma Neurologic:  [] Dizziness   [] Seizures   [] History of stroke   [] History of TIA  [] Aphasia   [] Vissual changes   [] Weakness or numbness in arm   [] Weakness or numbness in leg Musculoskeletal:   [] Joint swelling   [] Joint pain   [] Low back pain Hematologic:  [] Easy bruising  [] Easy bleeding   [] Hypercoagulable state   [] Anemic Gastrointestinal:  [] Diarrhea   [] Vomiting  [] Gastroesophageal reflux/heartburn   [] Difficulty  swallowing. Genitourinary:  [] Chronic kidney disease   [] Difficult urination  [] Frequent urination   [] Blood in urine Skin:  [] Rashes   [] Ulcers  Psychological:  [] History of anxiety   []  History of major depression.  Physical Examination  Vitals:   04/22/24 0926  BP: (!) 149/86  Pulse: 92  Resp: 18  Weight: 183 lb (83 kg)  Height: 5' 1.5 (1.562 m)   Body mass index is 34.02 kg/m. Gen: WD/WN, NAD Head: [place]/AT, No temporalis wasting.  Ear/Nose/Throat: Hearing grossly intact, nares w/o erythema or drainage, pinna without lesions Eyes: PER, EOMI, sclera nonicteric.  Neck: Supple, no gross masses.  No JVD.  Pulmonary:  Good air movement, no audible wheezing, no use of accessory muscles.  Cardiac: RRR, precordium  not hyperdynamic. Vascular:   Moderate venous stasis changes to the legs bilaterally.  2+ soft pitting edema. CEAP C4sEpAsPr   Vessel Right Left  Radial Palpable Palpable  Gastrointestinal: soft, non-distended. No guarding/no peritoneal signs.  Musculoskeletal: M/S 5/5 throughout.  No deformity.  Neurologic: CN 2-12 intact. Pain and light touch intact in extremities.  Symmetrical.  Speech is fluent. Motor exam as listed above. Psychiatric: Judgment intact, Mood & affect appropriate for pt's clinical situation. Dermatologic: Venous rashes no ulcers noted.  No changes consistent with cellulitis. Lymph : No lichenification or skin changes of chronic lymphedema.  CBC Lab Results  Component Value Date   WBC 8.8 04/09/2024   HGB 12.7 04/09/2024   HCT 39.0 04/09/2024   MCV 92.0 04/09/2024   PLT 275 04/09/2024    BMET    Component Value Date/Time   NA 137 04/09/2024 1247   NA 142 07/06/2012 0431   K 4.9 04/09/2024 1247   K 4.5 07/06/2012 0431   CL 101 04/09/2024 1247   CL 111 (H) 07/06/2012 0431   CO2 24 04/09/2024 1247   CO2 27 07/06/2012 0431   GLUCOSE 150 (H) 04/09/2024 1247   GLUCOSE 102 (H) 07/06/2012 0431   BUN 17 04/09/2024 1247   BUN 17 07/06/2012 0431    CREATININE 0.74 04/09/2024 1247   CREATININE 0.90 07/06/2012 0431   CALCIUM 9.4 04/09/2024 1247   CALCIUM 8.7 07/06/2012 0431   GFRNONAA >60 04/09/2024 1247   GFRNONAA 58 (L) 07/06/2012 0431   GFRAA >60 02/08/2017 0346   GFRAA >60 07/06/2012 0431   Estimated Creatinine Clearance: 40.6 mL/min (by C-G formula based on SCr of 0.74 mg/dL).  COAG Lab Results  Component Value Date   INR 1.0 04/25/2023   INR 0.9 06/18/2012    Radiology CT Angio Chest/Abd/Pel for Dissection W and/or Wo Contrast Result Date: 04/09/2024 CLINICAL DATA:  Chest and abdominal pain. Acute aortic syndrome suspected. EXAM: CT ANGIOGRAPHY CHEST, ABDOMEN AND PELVIS TECHNIQUE: Non-contrast CT of the chest was initially obtained. Multidetector CT imaging through the chest, abdomen and pelvis was performed using the standard protocol during bolus administration of intravenous contrast. Multiplanar reconstructed images and MIPs were obtained and reviewed to evaluate the vascular anatomy. RADIATION DOSE REDUCTION: This exam was performed according to the departmental dose-optimization program which includes automated exposure control, adjustment of the mA and/or kV according to patient size and/or use of iterative reconstruction technique. CONTRAST:  OMNIPAQUE  IOHEXOL  350 MG/ML SOLN COMPARISON:  CT abdomen pelvis dated 04/25/2023. FINDINGS: CTA CHEST FINDINGS Cardiovascular: Borderline cardiomegaly. No pericardial effusion. Advanced calcified and noncalcified plaque of the thoracic aorta. No aneurysmal dilatation or dissection. The origins of the great vessels of the aortic arch are patent. Evaluation of the pulmonary arteries is limited due to respiratory motion. No pulmonary artery embolus identified. Mediastinum/Nodes: No hilar or mediastinal adenopathy. The esophagus is grossly unremarkable. No mediastinal fluid collection. Lungs/Pleura: Minimal bibasilar atelectasis. No focal consolidation, pleural effusion or pneumothorax.  The central airways are patent. Musculoskeletal: Mildly displaced acute appearing fracture of the anterior left sixth rib. Displaced fracture of the posterolateral left ninth rib, age indeterminate, possibly acute or subacute. Osteopenia with degenerative changes of the spine. Review of the MIP images confirms the above findings. CTA ABDOMEN AND PELVIS FINDINGS VASCULAR Aorta: Advanced atherosclerotic calcification of the abdominal aorta. No aneurysmal dilatation or dissection. No periaortic fluid collection. Celiac: There is high-grade focal narrowing of the origin of the celiac trunk. The celiac artery and its major branches remain  patent. SMA: The SMA is patent. Renals: The renal arteries are patent. IMA: The IMA is patent. Inflow: Mild atherosclerotic calcification of the iliac arteries. No aneurysmal dilatation or dissection. Veins: No obvious venous abnormality within the limitations of this arterial phase study. Review of the MIP images confirms the above findings. NON-VASCULAR No intra-abdominal free air or free fluid. Hepatobiliary: The liver is unremarkable. Cholecystectomy. No biliary dilatation. Pancreas: No active inflammatory changes. No dilatation of the main pancreatic duct or gland atrophy. 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 Further characterization with MRI is recommended. Spleen: Normal in size without focal abnormality. Adrenals/Urinary Tract: The adrenal glands are unremarkable. Small right renal inferior pole cyst. There is no hydronephrosis on either side. The visualized ureters and urinary bladder appear unremarkable. Stomach/Bowel: Severe sigmoid diverticulosis and diffuse colonic diverticula. There is no bowel obstruction or active inflammation. The appendix is not visualized with certainty. No inflammatory changes identified in the right lower quadrant. Lymphatic: No adenopathy.  Reproductive: Hysterectomy.  No suspicious adnexal masses. Other: Midline vertical anterior pelvic wall incisional scar. Musculoskeletal: Osteopenia with scoliosis and degenerative changes. No acute osseous pathology. Prior ORIF of the left femoral neck. Review of the MIP images confirms the above findings. IMPRESSION: 1. No acute intra-abdominal, or pelvic pathology. No aortic aneurysm or dissection. 2. Mildly displaced acute appearing fracture of the anterior left sixth rib. Displaced fracture of the posterolateral left ninth rib, age indeterminate, possibly acute or subacute. 3. Colonic diverticulosis. No bowel obstruction. 4.  Aortic Atherosclerosis (ICD10-I70.0). Electronically Signed   By: Vanetta Chou M.D.   On: 04/09/2024 17:21   DG Chest 2 View Result Date: 04/09/2024 CLINICAL DATA:  Shortness of breath and left-sided chest pain EXAM: DG CHEST 2V COMPARISON:  Radiograph of the chest and ribs dated 02/21/2024 FINDINGS: Normal lung volumes. Hazy lateral right mid lung opacity. Confluent retrocardiac opacity. No pleural effusion or pneumothorax. Similar enlarged cardiomediastinal silhouette. No acute osseous abnormality. IMPRESSION: 1. Hazy lateral right mid lung opacity and confluent retrocardiac opacity, which may represent atelectasis or pneumonia. 2. Similar cardiomegaly. Electronically Signed   By: Limin  Xu M.D.   On: 04/09/2024 13:39     Assessment/Plan 1. Chronic deep vein thrombosis (DVT) of femoral vein of both lower extremities (HCC) (Primary) Recommend:   No surgery or intervention at this point in time.  IVC filter is not indicated at present.  Patient's previous duplex ultrasound of the venous system shows chronic changes of the veins.  The patient has completed 12 months anticoagulation.  I have had a long discussion with the patient and her son.  Given her age and relative immobility in association with the spontaneous nature of the clot onset it could be argued that  anticoagulation should be continued even if at half dose.  However, given the increasing number of falls with significant injuries her risks for remaining on anticoagulation outweigh the benefits particularly with respect to her age.  I did mention that if a clot recurs we would have to reconsider but at this point the patient wishes to stop her anticoagulation and I would have to concur given her recent traumas.  Elevation was stressed, such as the use of a recliner.  I have reviewed with the patient DVT and post phlebitic changes such as swelling and why it  causes symptoms such as pain.  I recommended to the patient to wear graduated compression stockings, beginning after three  full days of anticoagulation.  Graduated compression should be worn on a daily basis. The patient should wear compression beginning first thing in the morning and removing them in the evening. The patient is instructed specifically not to sleep in the stockings.  In addition, behavioral modification including elevation during the day and avoidance of prolonged dependency will be initiated.    The patient will stop anticoagulation for now as there have not been any problems or complications at this point.  I did discuss half dose therapy, however at this time the patient wishes to just stop anticoagulation.  2. History of pulmonary embolus (PE) Recommend:   No surgery or intervention at this point in time.  IVC filter is not indicated at present.  Patient's previous duplex ultrasound of the venous system shows chronic changes of the veins.  The patient has completed 12 months anticoagulation.  I have had a long discussion with the patient and her son.  Given her age and relative immobility in association with the spontaneous nature of the clot onset it could be argued that anticoagulation should be continued even if at half dose.  However, given the increasing number of falls with significant injuries her risks for remaining  on anticoagulation outweigh the benefits particularly with respect to her age.  I did mention that if a clot recurs we would have to reconsider but at this point the patient wishes to stop her anticoagulation and I would have to concur given her recent traumas.  Elevation was stressed, such as the use of a recliner.  I have reviewed with the patient DVT and post phlebitic changes such as swelling and why it  causes symptoms such as pain.  I recommended to the patient to wear graduated compression stockings, beginning after three full days of anticoagulation.  Graduated compression should be worn on a daily basis. The patient should wear compression beginning first thing in the morning and removing them in the evening. The patient is instructed specifically not to sleep in the stockings.  In addition, behavioral modification including elevation during the day and avoidance of prolonged dependency will be initiated.    The patient will stop anticoagulation for now as there have not been any problems or complications at this point.  I did discuss half dose therapy, however at this time the patient wishes to just stop anticoagulation.  3. Primary hypertension Continue antihypertensive medications as already ordered, these medications have been reviewed and there are no changes at this time.  4. Type 2 diabetes mellitus without complication, without long-term current use of insulin  (HCC) Continue hypoglycemic medications as already ordered, these medications have been reviewed and there are no changes at this time.  Hgb A1C to be monitored as already arranged by primary service  5. Mixed hyperlipidemia Continue pulmonary medications and aerosols as already ordered, these medications have been reviewed and there are no changes at this time.     Cordella Shawl, MD  04/22/2024 9:42 AM

## 2024-05-23 ENCOUNTER — Encounter: Payer: Self-pay | Admitting: Emergency Medicine

## 2024-05-23 ENCOUNTER — Emergency Department: Payer: MEDICARE

## 2024-05-23 ENCOUNTER — Emergency Department
Admission: EM | Admit: 2024-05-23 | Discharge: 2024-05-23 | Disposition: A | Discharge status: Home / Self Care | Payer: MEDICARE | Attending: Emergency Medicine | Admitting: Emergency Medicine

## 2024-05-23 ENCOUNTER — Other Ambulatory Visit: Payer: Self-pay

## 2024-05-23 DIAGNOSIS — Y92129 Unspecified place in nursing home as the place of occurrence of the external cause: Secondary | ICD-10-CM | POA: Diagnosis not present

## 2024-05-23 DIAGNOSIS — E119 Type 2 diabetes mellitus without complications: Secondary | ICD-10-CM | POA: Insufficient documentation

## 2024-05-23 DIAGNOSIS — W19XXXA Unspecified fall, initial encounter: Secondary | ICD-10-CM

## 2024-05-23 DIAGNOSIS — E039 Hypothyroidism, unspecified: Secondary | ICD-10-CM | POA: Diagnosis not present

## 2024-05-23 DIAGNOSIS — S0990XA Unspecified injury of head, initial encounter: Secondary | ICD-10-CM | POA: Diagnosis not present

## 2024-05-23 DIAGNOSIS — I10 Essential (primary) hypertension: Secondary | ICD-10-CM | POA: Diagnosis not present

## 2024-05-23 DIAGNOSIS — W1839XA Other fall on same level, initial encounter: Secondary | ICD-10-CM | POA: Diagnosis not present

## 2024-05-23 DIAGNOSIS — S20211A Contusion of right front wall of thorax, initial encounter: Secondary | ICD-10-CM | POA: Diagnosis not present

## 2024-05-23 DIAGNOSIS — S299XXA Unspecified injury of thorax, initial encounter: Secondary | ICD-10-CM | POA: Diagnosis present

## 2024-05-23 MED ORDER — LIDOCAINE 5 % EX PTCH
1.0000 | MEDICATED_PATCH | CUTANEOUS | Status: DC
  Administered 2024-05-23: 1 via TRANSDERMAL
  Filled 2024-05-23: qty 1

## 2024-05-23 MED ORDER — TRAMADOL HCL 50 MG PO TABS: 50.0000 mg | ORAL_TABLET | Freq: Two times a day (BID) | ORAL | 0 refills | Status: AC | PRN

## 2024-05-23 MED ORDER — LIDOCAINE 5 % EX PTCH: 1.0000 | MEDICATED_PATCH | Freq: Two times a day (BID) | CUTANEOUS | 1 refills | Status: AC

## 2024-05-23 MED ORDER — TRAMADOL HCL 50 MG PO TABS
50.0000 mg | ORAL_TABLET | Freq: Once | ORAL | Status: AC
  Administered 2024-05-23: 50 mg via ORAL
  Filled 2024-05-23: qty 1

## 2024-05-23 NOTE — ED Notes (Signed)
 Pt family called out for help with getting the pt to the restroom. Pt is in a wheelchair and unable to walk. This NT brought pt to the restroom and helped get her to the toilet and change brief as well. Pt did get her pants wet in the process of sitting on the toilet. Call bell given to the family if an other need occurs.

## 2024-05-23 NOTE — Discharge Instructions (Signed)
 Use the Lidoderm  patch and take the tramadol up to twice daily as needed for pain.  Follow-up with your primary care provider.  Return to the ER for new, worsening, or persistent severe headache, dizziness, chest pain, difficulty breathing, or any other new or worsening symptoms that are concerning.

## 2024-05-23 NOTE — ED Provider Notes (Signed)
 "  [facility] Provider Note    Event Date/Time   First MD Initiated Contact with Patient 05/23/24 1732     (approximate)   History   Fall   HPI  Cheryl Rios is a 89 y.o. female with a history of DM, hypertension, hypothyroidism, osteoporosis, OSA, DVT status post thrombectomy in 2024, and neuropathy who presents with pain after fall.  The patient states that she was transferring from a wheelchair to a walker when she suddenly fell.  She does believe she hit her head but did not lose consciousness.  She reports pain to her right side, main the right side of her chest down to her lower ribs and hip.  She denies any pain in the left.  She denies any neck or back pain.  I reviewed the past medical records.  The patient's most recent outpatient encounter was with vascular surgery on 12/1 for leg pain and swelling.  She reported multiple falls at that time.  She was discontinued on anticoagulation.   Physical Exam   Triage Vital Signs: ED Triage Vitals  Encounter Vitals Group     BP 05/23/24 1600 131/77     Girls Systolic BP Percentile --      Girls Diastolic BP Percentile --      Boys Systolic BP Percentile --      Boys Diastolic BP Percentile --      Pulse Rate 05/23/24 1600 96     Resp 05/23/24 1600 18     Temp 05/23/24 1600 (!) 97.5 F (36.4 C)     Temp Source 05/23/24 1600 Oral     SpO2 05/23/24 1600 96 %     Weight 05/23/24 1602 182 lb 15.7 oz (83 kg)     Height 05/23/24 1602 5' 1.5 (1.562 m)     Head Circumference --      Peak Flow --      Pain Score 05/23/24 1602 9     Pain Loc --      Pain Education --      Exclude from Growth Chart --     Most recent vital signs: Vitals:   05/23/24 1739 05/23/24 2033  BP:  138/79  Pulse:  85  Resp:  20  Temp:  97.7 F (36.5 C)  SpO2: 96% 97%     General: Awake, no distress.  CV:  Good peripheral perfusion.  Resp:  Normal effort.  Abd:  No distention.  Other:  EOMI.  PERRLA.  No facial  droop.  Normal speech.  Motor intact in all extremities.  No midline spinal tenderness.  Right lateral chest wall and rib tenderness with no step-off or crepitus.  No abdominal wall tenderness.  Full range of motion of the right hip and shoulder.   ED Results / Procedures / Treatments   Labs (all labs ordered are listed, but only abnormal results are displayed) Labs Reviewed - No data to display   EKG     RADIOLOGY  XR R shoulder: I independently viewed and interpreted images; there is no acute fracture  XR R hip/pelvis: No acute fracture  XR R knee: No acute fracture  XR chest/R ribs: No acute fracture  CT head: No ICH or other acute intracranial abnormality  CT cervical spine: No acute fracture  CT chest: No acute or right-sided rib fracture.  Multiple subacute left rib fractures.  IMPRESSION:  1. Age indeterminate, likely subacute fracture of the left sixth,  eighth and ninth  ribs.  2. No acute intrathoracic pathology.  3. A 3 mm left upper lobe subpleural nodule.  4.  Aortic Atherosclerosis (ICD10-I70.0).     PROCEDURES:  Critical Care performed: No  Procedures   MEDICATIONS ORDERED IN ED: Medications  lidocaine  (LIDODERM ) 5 % 1 patch (1 patch Transdermal Patch Applied 05/23/24 2026)  traMADol (ULTRAM) tablet 50 mg (50 mg Oral Given 05/23/24 2005)     IMPRESSION / MDM / ASSESSMENT AND PLAN / ED COURSE  I reviewed the triage vital signs and the nursing notes.  89 year old female with PMH as noted above presents after an apparent mechanical fall from standing height.  She states that she did hit her head.  She is complaining of pain to the right side of her torso.  Differential diagnosis includes, but is not limited to, chest wall contusion, rib fracture, hip contusion, shoulder contusion, minor head injury, concussion, TBI.  Patient's presentation is most consistent with acute complicated illness / injury requiring diagnostic workup.  Chest and right rib  x-rays, as well as x-rays of the right shoulder, hip, and knee were obtained are all negative for acute traumatic findings.  Given the patient's age with a head trauma I have added on CT head and cervical spine as well as an CT of the chest to rule out occult rib fractures given patient's age.  ----------------------------------------- 8:31 PM on 05/23/2024 -----------------------------------------  CT head and cervical spine are negative.  CT chest shows no acute or right-sided rib fractures.  There are several subacute appearing left-sided rib fractures that were identified on a prior CT of the chest in November.  At this time, the patient is stable for discharge home.  I counseled her on the results of the workup and answered all of her questions.  She has tolerated the tramadol given here, and her family member reported that she has done well with Lidoderm  patches in the past so I have prescribed these also.  I gave strict return precautions, and the patient and family member expressed understanding.   FINAL CLINICAL IMPRESSION(S) / ED DIAGNOSES   Final diagnoses:  Fall, initial encounter  Minor head injury, initial encounter  Contusion of right chest wall, initial encounter     Rx / DC Orders   ED Discharge Orders          Ordered    lidocaine  (LIDODERM ) 5 %  Every 12 hours        05/23/24 2020    traMADol (ULTRAM) 50 MG tablet  Every 12 hours PRN        05/23/24 2020             Note:  This document was prepared using Dragon voice recognition software and may include unintentional dictation errors.    Jacolyn Pae, MD 05/23/24 2034  "

## 2024-05-23 NOTE — ED Triage Notes (Signed)
 Pt with brother who reports pt stays at assisted living at [facility] Pt reports she fell 2 nights ago when trying to go from walker to wheelchair and landed on right side. Denies blood thinners.

## 2024-06-28 ENCOUNTER — Encounter: Payer: Self-pay | Admitting: Podiatry

## 2024-06-28 ENCOUNTER — Ambulatory Visit: Payer: MEDICARE | Admitting: Podiatry

## 2024-09-26 ENCOUNTER — Ambulatory Visit: Payer: MEDICARE | Admitting: Podiatry
